# Patient Record
Sex: Male | Born: 1945 | Race: White | Hispanic: No | Smoking: Former smoker
Health system: Southern US, Community
[De-identification: ages and names within clinical notes are randomized; demographics above are authoritative.]

## PROBLEM LIST (undated history)

## (undated) DIAGNOSIS — Z87442 Personal history of urinary calculi: Secondary | ICD-10-CM

## (undated) DIAGNOSIS — I10 Essential (primary) hypertension: Secondary | ICD-10-CM

## (undated) DIAGNOSIS — S065XAA Traumatic subdural hemorrhage with loss of consciousness status unknown, initial encounter: Secondary | ICD-10-CM

## (undated) DIAGNOSIS — S065X9A Traumatic subdural hemorrhage with loss of consciousness of unspecified duration, initial encounter: Secondary | ICD-10-CM

## (undated) DIAGNOSIS — C61 Malignant neoplasm of prostate: Secondary | ICD-10-CM

## (undated) DIAGNOSIS — K219 Gastro-esophageal reflux disease without esophagitis: Secondary | ICD-10-CM

## (undated) DIAGNOSIS — R569 Unspecified convulsions: Secondary | ICD-10-CM

## (undated) DIAGNOSIS — C44310 Basal cell carcinoma of skin of unspecified parts of face: Secondary | ICD-10-CM

## (undated) DIAGNOSIS — K565 Intestinal adhesions [bands], unspecified as to partial versus complete obstruction: Secondary | ICD-10-CM

## (undated) DIAGNOSIS — H57819 Brow ptosis, unspecified: Secondary | ICD-10-CM

## (undated) DIAGNOSIS — E785 Hyperlipidemia, unspecified: Secondary | ICD-10-CM

## (undated) HISTORY — DX: Essential (primary) hypertension: I10

## (undated) HISTORY — DX: Hyperlipidemia, unspecified: E78.5

## (undated) HISTORY — PX: BRAIN SURGERY: SHX531

## (undated) HISTORY — PX: BASAL CELL CARCINOMA EXCISION: SHX1214

---

## 1997-06-14 DIAGNOSIS — C61 Malignant neoplasm of prostate: Secondary | ICD-10-CM

## 1997-06-14 HISTORY — DX: Malignant neoplasm of prostate: C61

## 1998-03-27 ENCOUNTER — Other Ambulatory Visit: Admission: RE | Admit: 1998-03-27 | Discharge: 1998-03-27 | Payer: Self-pay | Admitting: Urology

## 1998-05-11 ENCOUNTER — Emergency Department (HOSPITAL_COMMUNITY): Admission: EM | Admit: 1998-05-11 | Discharge: 1998-05-11 | Payer: Self-pay | Admitting: Dentistry

## 1998-05-20 ENCOUNTER — Inpatient Hospital Stay (HOSPITAL_COMMUNITY): Admission: RE | Admit: 1998-05-20 | Discharge: 1998-05-27 | Payer: Self-pay | Admitting: Urology

## 1998-05-20 HISTORY — PX: PROSTATECTOMY: SHX69

## 1998-05-23 ENCOUNTER — Encounter: Payer: Self-pay | Admitting: Urology

## 1998-05-23 ENCOUNTER — Encounter: Payer: Self-pay | Admitting: *Deleted

## 1998-05-24 ENCOUNTER — Encounter: Payer: Self-pay | Admitting: Urology

## 2003-07-03 ENCOUNTER — Ambulatory Visit (HOSPITAL_COMMUNITY): Admission: RE | Admit: 2003-07-03 | Discharge: 2003-07-03 | Payer: Self-pay | Admitting: *Deleted

## 2003-07-03 ENCOUNTER — Encounter (INDEPENDENT_AMBULATORY_CARE_PROVIDER_SITE_OTHER): Payer: Self-pay | Admitting: Specialist

## 2009-07-14 ENCOUNTER — Ambulatory Visit: Payer: Self-pay | Admitting: Surgery

## 2009-07-25 ENCOUNTER — Encounter: Admission: RE | Admit: 2009-07-25 | Discharge: 2009-07-25 | Payer: Self-pay | Admitting: Cardiology

## 2010-10-13 HISTORY — PX: COLECTOMY: SHX59

## 2010-10-20 ENCOUNTER — Ambulatory Visit (HOSPITAL_COMMUNITY)
Admission: RE | Admit: 2010-10-20 | Discharge: 2010-10-20 | Disposition: A | Discharge status: Home / Self Care | Payer: Medicare Other | Source: Ambulatory Visit | Attending: General Surgery | Admitting: General Surgery

## 2010-10-20 ENCOUNTER — Other Ambulatory Visit (HOSPITAL_COMMUNITY): Payer: Self-pay | Admitting: General Surgery

## 2010-10-20 ENCOUNTER — Encounter (HOSPITAL_COMMUNITY)
Admission: RE | Admit: 2010-10-20 | Discharge: 2010-10-20 | Disposition: A | Discharge status: Home / Self Care | Payer: Medicare Other | Source: Ambulatory Visit | Attending: General Surgery | Admitting: General Surgery

## 2010-10-20 DIAGNOSIS — Z01812 Encounter for preprocedural laboratory examination: Secondary | ICD-10-CM | POA: Insufficient documentation

## 2010-10-20 DIAGNOSIS — I10 Essential (primary) hypertension: Secondary | ICD-10-CM | POA: Insufficient documentation

## 2010-10-20 DIAGNOSIS — D126 Benign neoplasm of colon, unspecified: Secondary | ICD-10-CM | POA: Insufficient documentation

## 2010-10-20 DIAGNOSIS — Z01818 Encounter for other preprocedural examination: Secondary | ICD-10-CM | POA: Insufficient documentation

## 2010-10-20 DIAGNOSIS — Z01810 Encounter for preprocedural cardiovascular examination: Secondary | ICD-10-CM | POA: Insufficient documentation

## 2010-10-20 LAB — COMPREHENSIVE METABOLIC PANEL
CO2: 30 mEq/L (ref 19–32)
Calcium: 10 mg/dL (ref 8.4–10.5)
Chloride: 101 mEq/L (ref 96–112)
Creatinine, Ser: 1.01 mg/dL (ref 0.4–1.5)
GFR calc non Af Amer: >60 mL/min (ref >60)
Glucose, Bld: 102 mg/dL — ABNORMAL HIGH (ref 70–99)
Total Bilirubin: 0.5 mg/dL (ref 0.3–1.2)

## 2010-10-20 LAB — DIFFERENTIAL
Basophils Absolute: 0 10*3/uL (ref 0.0–0.1)
Basophils Relative: 1 % (ref 0–1)
Eosinophils Absolute: 0 10*3/uL (ref 0.0–0.7)
Eosinophils Relative: 1 % (ref 0–5)
Lymphocytes Relative: 28 % (ref 12–46)
Lymphs Abs: 1.6 10*3/uL (ref 0.7–4.0)
Monocytes Absolute: 0.7 10*3/uL (ref 0.1–1.0)
Monocytes Relative: 13 % — ABNORMAL HIGH (ref 3–12)
Neutro Abs: 3.3 10*3/uL (ref 1.7–7.7)
Neutrophils Relative %: 58 % (ref 43–77)

## 2010-10-20 LAB — SURGICAL PCR SCREEN: Staphylococcus aureus: NEGATIVE

## 2010-10-20 LAB — CBC
HCT: 41.7 % (ref 39.0–52.0)
Hemoglobin: 14.3 g/dL (ref 13.0–17.0)
MCH: 30.8 pg (ref 26.0–34.0)
MCHC: 34.3 g/dL (ref 30.0–36.0)
MCV: 89.7 fL (ref 78.0–100.0)

## 2010-10-26 ENCOUNTER — Inpatient Hospital Stay (HOSPITAL_COMMUNITY)
Admission: RE | Admit: 2010-10-26 | Discharge: 2010-10-30 | DRG: 331 | Disposition: A | Discharge status: Home / Self Care | Payer: Medicare Other | Source: Ambulatory Visit | Attending: General Surgery | Admitting: General Surgery

## 2010-10-26 ENCOUNTER — Other Ambulatory Visit: Payer: Self-pay | Admitting: General Surgery

## 2010-10-26 DIAGNOSIS — D371 Neoplasm of uncertain behavior of stomach: Secondary | ICD-10-CM | POA: Diagnosis present

## 2010-10-26 DIAGNOSIS — D1330 Benign neoplasm of unspecified part of small intestine: Principal | ICD-10-CM | POA: Diagnosis present

## 2010-10-26 DIAGNOSIS — I10 Essential (primary) hypertension: Secondary | ICD-10-CM | POA: Diagnosis not present

## 2010-10-26 LAB — TYPE AND SCREEN
ABO/RH(D): O POS
Antibody Screen: NEGATIVE

## 2010-10-27 LAB — CBC
Hemoglobin: 14.6 g/dL (ref 13.0–17.0)
Hemoglobin: 15.1 g/dL (ref 13.0–17.0)
MCH: 30.9 pg (ref 26.0–34.0)
MCH: 31.6 pg (ref 26.0–34.0)
MCHC: 34.8 g/dL (ref 30.0–36.0)
MCHC: 35.5 g/dL (ref 30.0–36.0)
MCV: 88.8 fL (ref 78.0–100.0)
Platelets: 250 10*3/uL (ref 150–400)
RDW: 12.7 % (ref 11.5–15.5)

## 2010-10-27 LAB — DIFFERENTIAL
Basophils Absolute: 0 10*3/uL (ref 0.0–0.1)
Basophils Relative: 0 % (ref 0–1)
Basophils Relative: 0 % (ref 0–1)
Eosinophils Absolute: 0 10*3/uL (ref 0.0–0.7)
Eosinophils Absolute: 0 10*3/uL (ref 0.0–0.7)
Eosinophils Relative: 0 % (ref 0–5)
Lymphs Abs: 1 10*3/uL (ref 0.7–4.0)
Monocytes Absolute: 1.9 10*3/uL — ABNORMAL HIGH (ref 0.1–1.0)
Monocytes Absolute: 1.7 10*3/uL — ABNORMAL HIGH (ref 0.1–1.0)
Monocytes Relative: 11 % (ref 3–12)
Monocytes Relative: 10 % (ref 3–12)
Neutro Abs: 13.9 10*3/uL — ABNORMAL HIGH (ref 1.7–7.7)
Neutro Abs: 13.6 10*3/uL — ABNORMAL HIGH (ref 1.7–7.7)

## 2010-10-27 LAB — BASIC METABOLIC PANEL
BUN: 6 mg/dL (ref 6–23)
CO2: 24 mEq/L (ref 19–32)
Calcium: 9.7 mg/dL (ref 8.4–10.5)
Chloride: 92 mEq/L — ABNORMAL LOW (ref 96–112)
Creatinine, Ser: 0.81 mg/dL (ref 0.4–1.5)
GFR calc Af Amer: >60 mL/min (ref >60)

## 2010-10-27 NOTE — Procedures (Signed)
CAROTID DUPLEX EXAM   INDICATION:  Right-sided bruit.   HISTORY:  Diabetes:  No  Cardiac:  No  Hypertension:  Yes  Smoking:  No  Previous Surgery:  No  CV History:  Asymptomatic                                       RIGHT             LEFT  Brachial systolic pressure:         161               135  Brachial Doppler waveforms:         triphasic         triphasic  Vertebral direction of flow:        antegrade         antegrade  DUPLEX VELOCITIES (cm/sec)  CCA peak systolic                   140               160  ECA peak systolic                   211               152  ICA peak systolic                   182               118  ICA end diastolic                   53                51  PLAQUE MORPHOLOGY:                  mixed             mixed  PLAQUE AMOUNT:                      moderate          moderate  PLAQUE LOCATION:                    ICA, ECA, bifurcation               CCA, bifurcation, ICA   IMPRESSION:  1. A 40% to 59% stenosis noted in bilateral internal carotid arteries.  2. Bilateral external carotid artery stenosis.  3. Bilateral common carotid artery stenosis.  4. Internal carotid arteries are tortuous bilaterally.   ___________________________________________  V. Charlena Cross, MD   CJ/MEDQ  D:  07/14/2009  T:  07/14/2009  Job:  [id number]

## 2010-10-28 LAB — DIFFERENTIAL
Basophils Absolute: 0 10*3/uL (ref 0.0–0.1)
Basophils Relative: 0 % (ref 0–1)
Eosinophils Absolute: 0 10*3/uL (ref 0.0–0.7)
Monocytes Relative: 13 % — ABNORMAL HIGH (ref 3–12)
Neutro Abs: 5.5 10*3/uL (ref 1.7–7.7)
Neutrophils Relative %: 67 % (ref 43–77)

## 2010-10-28 LAB — BASIC METABOLIC PANEL
CO2: 27 mEq/L (ref 19–32)
Calcium: 9.2 mg/dL (ref 8.4–10.5)
Chloride: 100 mEq/L (ref 96–112)
Creatinine, Ser: 0.92 mg/dL (ref 0.4–1.5)
GFR calc Af Amer: >60 mL/min (ref >60)
Sodium: 136 mEq/L (ref 135–145)

## 2010-10-28 LAB — CBC
Hemoglobin: 14 g/dL (ref 13.0–17.0)
MCH: 30.9 pg (ref 26.0–34.0)
Platelets: 188 10*3/uL (ref 150–400)
RBC: 4.53 MIL/uL (ref 4.22–5.81)
WBC: 8.2 10*3/uL (ref 4.0–10.5)

## 2010-10-29 ENCOUNTER — Encounter (INDEPENDENT_AMBULATORY_CARE_PROVIDER_SITE_OTHER): Payer: Self-pay | Admitting: General Surgery

## 2010-11-11 NOTE — Discharge Summary (Signed)
  Martin Edwards, Martin Edwards NO.:  [id number]  MEDICAL RECORD NO.:  [id number]           PATIENT TYPE:  LOCATION:                                 FACILITY:  PHYSICIAN:  Cherylynn Ridges, M.D.    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  POSTDISCHARGE DIAGNOSIS:  Pending pathology.  PRINCIPAL PROCEDURE:  Partial right colectomy by Dr. Lindie Spruce.  SURGEON:  Cherylynn Ridges, MD  ASSISTANT:  Anselm Pancoast. Zachery Dakins, MD  DISCHARGE MEDICATIONS:  His home medications were Percocet to take for pain.  DIET ON DISCHARGE:  Regular.  CONDITION:  Stable.  He can shower and pat his wound dry.  He is to follow up to see me in a week from Tuesday which will be Nov 10, 2010.  BRIEF SUMMARY OF HOSPITAL COURSE:  The patient was admitted on the day of surgery for a right colectomy for a tubular wide base polyp of the ileocecal valve.  Postoperatively, he had what appeared to be an allergic reaction to some of his medication that was treated with Benadryl and although the patient had minimal itching.  He was bloated for his postop day, but that subsided.  He started having bowel movements and passing gas on postop day #3.  He has done well.  His diet has been advanced to a soft diet.  He complained today of just some lateral flank tenderness and maybe some gas pain, but he has had regular bowel movements and passing gas.  His wound was great with no infection.  He has got good bowel sounds.  He does not look very sick.  He is afebrile and vital signs stable.  His last white count was 8.6 and his hemoglobin was 14.  He is being discharged home in care of his family, will follow up to see me in a week from Tuesday, Nov 10, 2010.  He should call if he should have any problems before that.     Cherylynn Ridges,  M.D.     JOW/MEDQ  D:  10/30/2010  T:  10/30/2010  Job:  [id number]  Electronically Signed by Jimmye Norman M.D. on 11/11/2010 05:11:03 PM

## 2010-11-11 NOTE — Op Note (Signed)
NAMEEMERY, DUPUY NO.:  [id number]  MEDICAL RECORD NO.:  [id number]           PATIENT TYPE:  I  LOCATION:  5122                         FACILITY:  [facility]  PHYSICIAN:  Cherylynn Ridges, M.D.    DATE OF BIRTH:  14-Jul-1945  DATE OF PROCEDURE:  10/26/2010 DATE OF DISCHARGE:                              OPERATIVE REPORT   PREOPERATIVE DIAGNOSIS:  Sessile polyp of the terminal ileum and right colon proximal ascending colon.  POSTOPERATIVE DIAGNOSIS:  Sessile polyp of the terminal ileum and right colon proximal ascending colon.  PROCEDURE:  Partial right colectomy.  SURGEON:  Cherylynn Ridges, MD  ASSISTANT:  Dr. Zachery Dakins.  ANESTHESIA:  General endotracheal.  ESTIMATED BLOOD LOSS:  Less than 50 mL.  COMPLICATIONS:  None.  CONDITION:  Stable.  FINDINGS:  Sessile polyp of the terminal ileum and proximal ascending colon.  INDICATIONS FOR OPERATION:  The patient is a 65 year old with a recent colonoscopy demonstrating a sort of shag-rug tubulovillous adenoma of the terminal ileum where the proximal right colon, comes in now for right colectomy.  OPERATION:  After proper time-out was performed, identifying the patient, the procedure will be performed.  He was prepped and draped in usual sterile manner exposing the entire abdomen.  A right transverse incision which measured approximately 12-cm long was made initially with #10 blade at the level of the umbilicus.  We took it down to and through the subcutaneous tissue through and to the anterior rectus sheath, the rectus muscle itself and then the posterior rectus sheath into the peritoneal cavity.  We primarily reached and retractor in place, we were able to mobilize the right colon which was freely mobile initially on its own.  We transected the terminal ileum just proximal to the terminal ileum using a GIA 75 stapler.  We mobilized right colon just proximal to the hepatic flexure and then transected the  right colon at that point with the GIA 75 stapler.  We took the intervening mesentery using an EnSeal super jaw device up until we got to the ileocecal blood vessels which was ligated with 2-0 silk ties.  Once we had the mesentery taken, we removed the specimen, opened and off the field to confirm the presence of the colonoscopically identified the polyp.  Once this was done, we went ahead and did a primary anastomosis using a GIA 75 stapler was through the small bowel and the distal ascending colon.  The resulting enterotomy was closed using a TX-60 stapler.  We closed the mesentery using interrupted 2-0 silk sutures.  We oversewed the anastomotic line using 3-0 silk sutures.  Gloves were changed after the primary section and also after the anastomosis completed.  We then irrigated with saline solution about a half a liter of saline was used and then we closed.  The posterior and anterior rectus sheath were closed using running looped 0 PDS suture, then the skin was closed using running subcuticular stitch of 5-0 Monocryl.  A 0.5% Marcaine with epi was injected into the subcu prior to closure.  Dermabond, Steri-Strips and Tegaderm were used to complete the dressing.  All needle, sponge counts and instrument counts were correct at the conclusion of the case.     Cherylynn Ridges, M.D.     JOW/MEDQ  D:  10/26/2010  T:  10/26/2010  Job:  [id number]  cc:   Shirley Friar, MD  Electronically Signed by Jimmye Norman M.D. on 11/11/2010 05:10:58 PM

## 2010-12-29 ENCOUNTER — Ambulatory Visit (INDEPENDENT_AMBULATORY_CARE_PROVIDER_SITE_OTHER): Payer: Medicare Other | Admitting: General Surgery

## 2010-12-29 ENCOUNTER — Encounter (INDEPENDENT_AMBULATORY_CARE_PROVIDER_SITE_OTHER): Payer: Self-pay | Admitting: General Surgery

## 2010-12-29 VITALS — BP 162/82 | HR 72 | Temp 97.4°F

## 2010-12-29 DIAGNOSIS — Z09 Encounter for follow-up examination after completed treatment for conditions other than malignant neoplasm: Secondary | ICD-10-CM | POA: Insufficient documentation

## 2010-12-29 NOTE — Progress Notes (Signed)
HPI The patient is status post right colectomy and doing well. He played tennis last night his wound is healing well with no infection.  PE His wound is healed well no infection she's got good bowel sounds.  Studiy review There are no studies reviewed today.  Assessment Status post right colectomy doing well.  Plan Discharge patient from clinic. I advised him to get a colonoscopy within a year.

## 2011-09-07 ENCOUNTER — Other Ambulatory Visit: Payer: Self-pay | Admitting: Emergency Medicine

## 2011-10-21 ENCOUNTER — Ambulatory Visit (INDEPENDENT_AMBULATORY_CARE_PROVIDER_SITE_OTHER): Payer: Medicare Other | Admitting: Emergency Medicine

## 2011-10-21 VITALS — BP 158/81 | HR 88 | Temp 98.1°F | Resp 18 | Ht 70.5 in | Wt 171.2 lb

## 2011-10-21 DIAGNOSIS — D126 Benign neoplasm of colon, unspecified: Secondary | ICD-10-CM | POA: Insufficient documentation

## 2011-10-21 DIAGNOSIS — E785 Hyperlipidemia, unspecified: Secondary | ICD-10-CM | POA: Insufficient documentation

## 2011-10-21 DIAGNOSIS — Q759 Congenital malformation of skull and face bones, unspecified: Secondary | ICD-10-CM

## 2011-10-21 DIAGNOSIS — R7309 Other abnormal glucose: Secondary | ICD-10-CM

## 2011-10-21 DIAGNOSIS — R739 Hyperglycemia, unspecified: Secondary | ICD-10-CM

## 2011-10-21 DIAGNOSIS — Z8546 Personal history of malignant neoplasm of prostate: Secondary | ICD-10-CM

## 2011-10-21 DIAGNOSIS — Q752 Hypertelorism: Secondary | ICD-10-CM

## 2011-10-21 DIAGNOSIS — I10 Essential (primary) hypertension: Secondary | ICD-10-CM | POA: Insufficient documentation

## 2011-10-21 LAB — COMPREHENSIVE METABOLIC PANEL
ALT: 17 U/L (ref 0–53)
AST: 20 U/L (ref 0–37)
Albumin: 4.7 g/dL (ref 3.5–5.2)
Alkaline Phosphatase: 68 U/L (ref 39–117)
Calcium: 10.2 mg/dL (ref 8.4–10.5)
Chloride: 102 mEq/L (ref 96–112)
Potassium: 5.2 mEq/L (ref 3.5–5.3)
Sodium: 140 mEq/L (ref 135–145)

## 2011-10-21 LAB — GLUCOSE, POCT (MANUAL RESULT ENTRY): POC Glucose: 114

## 2011-10-21 LAB — PSA, MEDICARE: PSA: 0.16 ng/mL (ref <=4.00)

## 2011-10-21 LAB — LIPID PANEL: LDL Cholesterol: 94 mg/dL (ref 0–99)

## 2011-10-21 LAB — POCT CBC
Granulocyte percent: 71.7 %G (ref 37–80)
HCT, POC: 43.7 % (ref 43.5–53.7)
Hemoglobin: 14.3 g/dL (ref 14.1–18.1)
Lymph, poc: 1.6 (ref 0.6–3.4)
MPV: 11.5 fL (ref 0–99.8)
POC Granulocyte: 5.1 (ref 2–6.9)
RBC: 4.72 M/uL (ref 4.69–6.13)

## 2011-10-21 LAB — POCT URINALYSIS DIPSTICK
Glucose, UA: NEGATIVE
Ketones, UA: NEGATIVE
Nitrite, UA: NEGATIVE

## 2011-10-21 LAB — POCT GLYCOSYLATED HEMOGLOBIN (HGB A1C): Hemoglobin A1C: 5.8

## 2011-10-21 LAB — POCT UA - MICROSCOPIC ONLY
Bacteria, U Microscopic: NEGATIVE
Casts, Ur, LPF, POC: NEGATIVE
Crystals, Ur, HPF, POC: NEGATIVE
Mucus, UA: NEGATIVE

## 2011-10-21 NOTE — Progress Notes (Signed)
  Subjective:    Patient ID: Martin Edwards, male    DOB: 1946/02/01, 66 y.o.   MRN: [id number]  HPI patient enters for followup of blood pressure high cholesterol. Since his last visit here he has undergone colonoscopy with abnormal findings. He subsequently had a segment of colon removed that was suspicious for early precancerous changes. Surgery was performed by Dr. Lindie Spruce. He overall has been doing well no complaints at the present time. He feels well he is very active.    Review of Systems is no complaints at the present time     Objective:   Physical Exam  Constitutional: He appears well-developed and well-nourished.  HENT:  Right Ear: External ear normal.  Left Ear: External ear normal.  Eyes: Pupils are equal, round, and reactive to light.  Neck: No thyromegaly present.  Cardiovascular: Normal rate, regular rhythm and normal heart sounds.   Pulmonary/Chest: Effort normal and breath sounds normal. He has no wheezes. He has no rales.  Abdominal: He exhibits no distension and no mass. There is no tenderness. There is no rebound and no guarding.    EKG normal sinus      Assessment & Plan:    Patient undergo treatment for his blood pressure and cholesterol. EKG is within normal limits. Have advised that he see his dermatologist. I have advised he call Dr. Bosie Clos and see if they want to proceed with a colonoscopy to be done one year post procedure.

## 2011-10-25 ENCOUNTER — Other Ambulatory Visit: Payer: Self-pay | Admitting: Physician Assistant

## 2011-12-23 ENCOUNTER — Other Ambulatory Visit: Payer: Self-pay | Admitting: *Deleted

## 2011-12-23 MED ORDER — LISINOPRIL 40 MG PO TABS: 40.0000 mg | ORAL_TABLET | Freq: Every day | ORAL | Status: DC

## 2011-12-23 MED ORDER — SIMVASTATIN 40 MG PO TABS: 40.0000 mg | ORAL_TABLET | Freq: Every day | ORAL | Status: DC

## 2011-12-23 MED ORDER — AMLODIPINE BESYLATE 10 MG PO TABS: 10.0000 mg | ORAL_TABLET | Freq: Every day | ORAL | Status: DC

## 2012-02-21 ENCOUNTER — Encounter: Payer: Self-pay | Admitting: Cardiology

## 2012-04-15 ENCOUNTER — Other Ambulatory Visit: Payer: Self-pay | Admitting: Physician Assistant

## 2012-08-10 ENCOUNTER — Other Ambulatory Visit: Payer: Self-pay | Admitting: Physician Assistant

## 2012-08-11 ENCOUNTER — Other Ambulatory Visit: Payer: Self-pay | Admitting: Physician Assistant

## 2012-08-11 ENCOUNTER — Ambulatory Visit (INDEPENDENT_AMBULATORY_CARE_PROVIDER_SITE_OTHER): Payer: Medicare Other | Admitting: Emergency Medicine

## 2012-08-11 VITALS — BP 158/68 | HR 97 | Temp 98.4°F | Resp 18 | Ht 69.0 in | Wt 171.0 lb

## 2012-08-11 DIAGNOSIS — E785 Hyperlipidemia, unspecified: Secondary | ICD-10-CM

## 2012-08-11 DIAGNOSIS — D369 Benign neoplasm, unspecified site: Secondary | ICD-10-CM

## 2012-08-11 LAB — POCT CBC
Granulocyte percent: 45.3 %G (ref 37–80)
HCT, POC: 46.6 % (ref 43.5–53.7)
Lymph, poc: 2.7 (ref 0.6–3.4)
MCV: 94.7 fL (ref 80–97)
POC LYMPH PERCENT: 46.1 %L (ref 10–50)
RDW, POC: 14 %

## 2012-08-11 LAB — COMPREHENSIVE METABOLIC PANEL
Albumin: 4.5 g/dL (ref 3.5–5.2)
Alkaline Phosphatase: 60 U/L (ref 39–117)
BUN: 9 mg/dL (ref 6–23)
CO2: 28 mEq/L (ref 19–32)
Calcium: 9.7 mg/dL (ref 8.4–10.5)
Glucose, Bld: 105 mg/dL — ABNORMAL HIGH (ref 70–99)
Potassium: 4.8 mEq/L (ref 3.5–5.3)

## 2012-08-11 LAB — LIPID PANEL
Cholesterol: 180 mg/dL (ref 0–200)
HDL: 44 mg/dL (ref >39)
LDL Cholesterol: 114 mg/dL — ABNORMAL HIGH (ref 0–99)
Triglycerides: 111 mg/dL (ref <150)

## 2012-08-11 MED ORDER — LISINOPRIL 40 MG PO TABS: 40.0000 mg | ORAL_TABLET | Freq: Every day | ORAL | Status: DC

## 2012-08-11 MED ORDER — PRAVASTATIN SODIUM 40 MG PO TABS: 40.0000 mg | ORAL_TABLET | Freq: Every day | ORAL | Status: DC

## 2012-08-11 MED ORDER — AMLODIPINE BESYLATE 10 MG PO TABS: 10.0000 mg | ORAL_TABLET | Freq: Every day | ORAL | Status: DC

## 2012-08-11 NOTE — Progress Notes (Signed)
Subjective:    Patient ID: Martin Edwards, male    DOB: Feb 23, 1946, 67 y.o.   MRN: [id number]  HPI patient enters for recheck of his blood pressure and high cholesterol. He's been taking all his medications he had surgery last year for an adenomatous polyp in the right side of his colon biopsy report showed no malignancy no severe dysplasia . Margins were clear at the surgical site    Review of Systems     Objective:   Physical Exam HEENT exam is unremarkable. Neck is supple. Chest is clear to both auscultation and percussion. Heart regular rate no murmurs rubs or gallops appreciated extremities are without edema. Repeat blood pressure was taken and was 122/78  Results for orders placed in visit on 10/21/11  COMPREHENSIVE METABOLIC PANEL      Result Value Range   Sodium 140  135 - 145 mEq/L   Potassium 5.2  3.5 - 5.3 mEq/L   Chloride 102  96 - 112 mEq/L   CO2 27  19 - 32 mEq/L   Glucose, Bld 115 (*) 70 - 99 mg/dL   BUN 11  6 - 23 mg/dL   Creat 4.09  8.11 - 9.14 mg/dL   Total Bilirubin 0.9  0.3 - 1.2 mg/dL   Alkaline Phosphatase 68  39 - 117 U/L   AST 20  0 - 37 U/L   ALT 17  0 - 53 U/L   Total Protein 7.8  6.0 - 8.3 g/dL   Albumin 4.7  3.5 - 5.2 g/dL   Calcium 78.2  8.4 - 95.6 mg/dL  LIPID PANEL      Result Value Range   Cholesterol 166  0 - 200 mg/dL   Triglycerides 213  <086 mg/dL   HDL 46  >57 mg/dL   Total CHOL/HDL Ratio 3.6     VLDL 26  0 - 40 mg/dL   LDL Cholesterol 94  0 - 99 mg/dL  PSA, MEDICARE      Result Value Range   PSA 0.16  <=4.00 ng/mL  POCT URINALYSIS DIPSTICK      Result Value Range   Color, UA yellow     Clarity, UA clear     Glucose, UA neg     Bilirubin, UA neg     Ketones, UA neg     Spec Grav, UA 1.010     Blood, UA neg     pH, UA 6.5     Protein, UA neg     Urobilinogen, UA 0.2     Nitrite, UA neg     Leukocytes, UA Negative    POCT UA - MICROSCOPIC ONLY      Result Value Range   WBC, Ur, HPF, POC rare     RBC, urine, microscopic  rare     Bacteria, U Microscopic neg     Mucus, UA neg     Epithelial cells, urine per micros 0-1     Crystals, Ur, HPF, POC neg     Casts, Ur, LPF, POC neg     Yeast, UA neg    POCT CBC      Result Value Range   WBC 7.1  4.6 - 10.2 K/uL   Lymph, poc 1.6  0.6 - 3.4   POC LYMPH PERCENT 22.4  10 - 50 %L   MID (cbc) 0.4  0 - 0.9   POC MID % 5.9  0 - 12 %M   POC Granulocyte 5.1  2 -  6.9   Granulocyte percent 71.7  37 - 80 %G   RBC 4.72  4.69 - 6.13 M/uL   Hemoglobin 14.3  14.1 - 18.1 g/dL   HCT, POC 78.2  95.6 - 53.7 %   MCV 92.5  80 - 97 fL   MCH, POC 30.3  27 - 31.2 pg   MCHC 32.7  31.8 - 35.4 g/dL   RDW, POC 21.3     Platelet Count, POC 216  142 - 424 K/uL   MPV 11.5  0 - 99.8 fL  GLUCOSE, POCT (MANUAL RESULT ENTRY)      Result Value Range   POC Glucose 114    POCT GLYCOSYLATED HEMOGLOBIN (HGB A1C)      Result Value Range   Hemoglobin A1C 5.8     Results for orders placed in visit on 08/11/12  POCT CBC      Result Value Range   WBC 5.9  4.6 - 10.2 K/uL   Lymph, poc 2.7  0.6 - 3.4   POC LYMPH PERCENT 46.1  10 - 50 %L   MID (cbc) 0.5  0 - 0.9   POC MID % 8.6  0 - 12 %M   POC Granulocyte 2.7  2 - 6.9   Granulocyte percent 45.3  37 - 80 %G   RBC 4.92  4.69 - 6.13 M/uL   Hemoglobin 15.0  14.1 - 18.1 g/dL   HCT, POC 08.6  57.8 - 53.7 %   MCV 94.7  80 - 97 fL   MCH, POC 30.5  27 - 31.2 pg   MCHC 32.2  31.8 - 35.4 g/dL   RDW, POC 46.9     Platelet Count, POC 173  142 - 424 K/uL   MPV 13.2  0 - 99.8 fL       Assessment & Plan:  Patient stable on current regimen. He refused a flu injection. Labs were done and all medicines refilled

## 2012-08-12 ENCOUNTER — Other Ambulatory Visit: Payer: Self-pay | Admitting: Physician Assistant

## 2012-12-12 ENCOUNTER — Encounter: Payer: Self-pay | Admitting: Emergency Medicine

## 2012-12-12 ENCOUNTER — Ambulatory Visit (INDEPENDENT_AMBULATORY_CARE_PROVIDER_SITE_OTHER): Payer: Medicare Other | Admitting: Emergency Medicine

## 2012-12-12 VITALS — BP 158/68 | HR 76 | Temp 98.2°F | Resp 16 | Ht 68.5 in | Wt 168.0 lb

## 2012-12-12 DIAGNOSIS — E785 Hyperlipidemia, unspecified: Secondary | ICD-10-CM

## 2012-12-12 DIAGNOSIS — Z139 Encounter for screening, unspecified: Secondary | ICD-10-CM

## 2012-12-12 DIAGNOSIS — I6523 Occlusion and stenosis of bilateral carotid arteries: Secondary | ICD-10-CM

## 2012-12-12 DIAGNOSIS — C61 Malignant neoplasm of prostate: Secondary | ICD-10-CM | POA: Insufficient documentation

## 2012-12-12 DIAGNOSIS — Z0000 Encounter for general adult medical examination without abnormal findings: Secondary | ICD-10-CM

## 2012-12-12 DIAGNOSIS — I10 Essential (primary) hypertension: Secondary | ICD-10-CM

## 2012-12-12 DIAGNOSIS — Z125 Encounter for screening for malignant neoplasm of prostate: Secondary | ICD-10-CM

## 2012-12-12 LAB — CBC WITH DIFFERENTIAL/PLATELET
Basophils Absolute: 0 10*3/uL (ref 0.0–0.1)
HCT: 42.4 % (ref 39.0–52.0)
Lymphocytes Relative: 23 % (ref 12–46)
Neutro Abs: 3.2 10*3/uL (ref 1.7–7.7)
Platelets: 213 10*3/uL (ref 150–400)
RBC: 4.86 MIL/uL (ref 4.22–5.81)
RDW: 14.1 % (ref 11.5–15.5)
WBC: 4.9 10*3/uL (ref 4.0–10.5)

## 2012-12-12 LAB — COMPREHENSIVE METABOLIC PANEL
ALT: 14 U/L (ref 0–53)
AST: 17 U/L (ref 0–37)
Albumin: 4.9 g/dL (ref 3.5–5.2)
Calcium: 9.7 mg/dL (ref 8.4–10.5)
Chloride: 102 mEq/L (ref 96–112)
Potassium: 4.3 mEq/L (ref 3.5–5.3)
Total Protein: 7.7 g/dL (ref 6.0–8.3)

## 2012-12-12 LAB — POCT GLYCOSYLATED HEMOGLOBIN (HGB A1C): Hemoglobin A1C: 5.5

## 2012-12-12 LAB — POCT URINALYSIS DIPSTICK
Bilirubin, UA: NEGATIVE
Blood, UA: NEGATIVE
Glucose, UA: NEGATIVE
Ketones, UA: NEGATIVE
Spec Grav, UA: <=1.005

## 2012-12-12 LAB — PSA, MEDICARE: PSA: 0.23 ng/mL (ref <=4.00)

## 2012-12-12 NOTE — Progress Notes (Signed)
@[email or web address] @  Patient ID: Martin Edwards MRN: [id number], DOB: 1946-01-29 67 y.o. Date of Encounter: 12/12/2012, 10:00 AM  Primary Physician: Lucilla Edin, MD  Chief Complaint: Physical (CPE)  HPI: 67 y.o. y/o male with history noted below here for CPE.  Doing well. No issues/complaints.  Review of Systems:  Consitutional: No fever, chills, fatigue, night sweats, lymphadenopathy, or weight changes. Eyes: No visual changes, eye redness, or discharge. ENT/Mouth: Ears: No otalgia, tinnitus, hearing loss, discharge. Nose: No congestion, rhinorrhea, sinus pain, or epistaxis. Throat: No sore throat, post nasal drip, or teeth pain. Cardiovascular: No CP, palpitations, diaphoresis, DOE, edema, orthopnea, PND. Respiratory: No cough, hemoptysis, SOB, or wheezing. Gastrointestinal: No anorexia, dysphagia, reflux, pain, nausea, vomiting, hematemesis, diarrhea, constipation, BRBPR, or melena he has a history of surgery on his colon for precancerous lesion the. Genitourinary: No dysuria, frequency, urgency, hematuria, incontinence, nocturia, decreased urinary stream, discharge, impotence, or testicular pain/masses he has a history of prostate cancer followed have been clear. Musculoskeletal: No decreased ROM, myalgias, stiffness, joint swelling, or weakness. Skin: No rash, erythema, lesion changes, pain, warmth, jaundice, or pruritis. Neurological: No headache, dizziness, syncope, seizures, tremors, memory loss, coordination problems, or paresthesias. Psychological: No anxiety, depression, hallucinations, SI/HI. Endocrine: No fatigue, polydipsia, polyphagia, polyuria, or known diabetes. All other systems were reviewed and are otherwise negative.  Past Medical History  Diagnosis Date  . Hypertension   . Hyperlipidemia   . Cancer      Past Surgical History  Procedure Laterality Date  . Prostate surgery  05/20/1998  . Colon surgery      Home Meds:  Prior to Admission medications   Medication  Sig Start Date End Date Taking? Authorizing Provider  amLODipine (NORVASC) 10 MG tablet Take 1 tablet (10 mg total) by mouth daily. Need office visit for additional refills. 08/11/12  Yes Collene Gobble, MD  lisinopril (PRINIVIL,ZESTRIL) 40 MG tablet Take 1 tablet (40 mg total) by mouth daily. Need office visit for additional refills. 08/11/12  Yes Collene Gobble, MD  pravastatin (PRAVACHOL) 40 MG tablet Take 1 tablet (40 mg total) by mouth daily. 08/11/12  Yes Collene Gobble, MD    Allergies: No Known Allergies  History   Social History  . Marital Status: Divorced    Spouse Name: N/A    Number of Children: N/A  . Years of Education: N/A   Occupational History  . Not on file.   Social History Main Topics  . Smoking status: Never Smoker   . Smokeless tobacco: Not on file  . Alcohol Use: No     Comment: very little  . Drug Use: No  . Sexually Active: Not on file   Other Topics Concern  . Not on file   Social History Narrative  . No narrative on file    Family History  Problem Relation Age of Onset  . Cancer Father     Lung    Physical Exam:  Blood pressure 158/68, pulse 76, temperature 98.2 F (36.8 C), temperature source Oral, resp. rate 16, height 5' 8.5" (1.74 m), weight 168 lb (76.204 kg), SpO2 100.00%.  General: Well developed, well nourished, in no acute distress. HEENT: Normocephalic, atraumatic. Conjunctiva pink, sclera non-icteric. Pupils 2 mm constricting to 1 mm, round, regular, and equally reactive to light and accomodation. EOMI. Internal auditory canal clear. TMs with good cone of light and without pathology. Nasal mucosa pink. Nares are without discharge. No sinus tenderness. Oral mucosa pink. Dentition . Pharynx without exudate.  Neck: Supple. Trachea midline. No thyromegaly. Full ROM. No lymphadenopathy. Lungs: Clear to auscultation bilaterally without wheezes, rales, or rhonchi. Breathing is of normal effort and unlabored. Cardiovascular: RRR with S1 S2. No  murmurs, rubs, or gallops appreciated. Distal pulses 2+ symmetrically. No carotid or abdominal bruits.  Abdomen: Soft, non-tender, non-distended with normoactive bowel sounds. No hepatosplenomegaly or masses. No rebound/guarding. No CVA tenderness. Without hernias.  Rectal: No external hemorrhoids or fissures. Rectal vault without masses. Genitourinary:  circumcised male. No penile lesions. Testes descended bilaterally, and smooth without tenderness or masses. There is no prostate tissue palpable.  Musculoskeletal: Full range of motion and 5/5 strength throughout. Without swelling, atrophy, tenderness, crepitus, or warmth. Extremities without clubbing, cyanosis, or edema. Calves supple. Skin: Warm and moist without erythema, ecchymosis, wounds, or rash. There is a 3 x 3 cm cystic area adjacent to the sacrum on his back. Neuro: A+Ox3. CN II-XII grossly intact. Moves all extremities spontaneously. Full sensation throughout. Normal gait. DTR 2+ throughout upper and lower extremities. Finger to nose intact. Psych:  Responds to questions appropriately with a normal affect.  EKG normal sinus rhythm mild J-point depression . Results for orders placed in visit on 12/12/12  IFOBT (OCCULT BLOOD)      Result Value Range   IFOBT Negative    POCT URINALYSIS DIPSTICK      Result Value Range   Color, UA yellow     Clarity, UA clear     Glucose, UA neg     Bilirubin, UA neg     Ketones, UA neg     Spec Grav, UA <=1.005     Blood, UA neg     pH, UA 6.0     Protein, UA neg     Urobilinogen, UA 0.2     Nitrite, UA neg     Leukocytes, UA Negative    POCT GLYCOSYLATED HEMOGLOBIN (HGB A1C)      Result Value Range   Hemoglobin A1C 5.5     Studies: CBC, CMET, Lipid, PSA, UA,Hemosure      Assessment/Plan:  67 y.o. y/o physically active male who is here for a physical exam. Routine labs were done. He has a history of premalignant colon pathology. Jos is a history of prostate cancer and his PSA was  slowly rising on last check needs to be repeated the  -  Signed, Earl Lites, MD 12/12/2012 10:00 AM

## 2013-06-19 ENCOUNTER — Ambulatory Visit (INDEPENDENT_AMBULATORY_CARE_PROVIDER_SITE_OTHER): Payer: Medicare Other | Admitting: Emergency Medicine

## 2013-06-19 ENCOUNTER — Encounter: Payer: Self-pay | Admitting: Emergency Medicine

## 2013-06-19 VITALS — BP 164/78 | HR 83 | Temp 98.3°F | Resp 16 | Ht 69.0 in | Wt 170.2 lb

## 2013-06-19 DIAGNOSIS — Z125 Encounter for screening for malignant neoplasm of prostate: Secondary | ICD-10-CM

## 2013-06-19 DIAGNOSIS — C61 Malignant neoplasm of prostate: Secondary | ICD-10-CM

## 2013-06-19 DIAGNOSIS — I10 Essential (primary) hypertension: Secondary | ICD-10-CM

## 2013-06-19 DIAGNOSIS — E785 Hyperlipidemia, unspecified: Secondary | ICD-10-CM

## 2013-06-19 LAB — CBC WITH DIFFERENTIAL/PLATELET
BASOS ABS: 0 10*3/uL (ref 0.0–0.1)
Basophils Relative: 0 % (ref 0–1)
EOS ABS: 0 10*3/uL (ref 0.0–0.7)
EOS PCT: 0 % (ref 0–5)
HEMATOCRIT: 44.3 % (ref 39.0–52.0)
Hemoglobin: 15.6 g/dL (ref 13.0–17.0)
LYMPHS ABS: 1.3 10*3/uL (ref 0.7–4.0)
LYMPHS PCT: 27 % (ref 12–46)
MCH: 30.4 pg (ref 26.0–34.0)
MCHC: 35.2 g/dL (ref 30.0–36.0)
MCV: 86.4 fL (ref 78.0–100.0)
MONO ABS: 0.4 10*3/uL (ref 0.1–1.0)
Monocytes Relative: 9 % (ref 3–12)
Neutro Abs: 3.1 10*3/uL (ref 1.7–7.7)
Neutrophils Relative %: 64 % (ref 43–77)
PLATELETS: 212 10*3/uL (ref 150–400)
RBC: 5.13 MIL/uL (ref 4.22–5.81)
RDW: 14.3 % (ref 11.5–15.5)
WBC: 4.8 10*3/uL (ref 4.0–10.5)

## 2013-06-19 LAB — COMPLETE METABOLIC PANEL WITH GFR
ALBUMIN: 4.4 g/dL (ref 3.5–5.2)
ALK PHOS: 60 U/L (ref 39–117)
ALT: 12 U/L (ref 0–53)
AST: 18 U/L (ref 0–37)
BILIRUBIN TOTAL: 1 mg/dL (ref 0.3–1.2)
BUN: 8 mg/dL (ref 6–23)
CO2: 25 mEq/L (ref 19–32)
Calcium: 9.6 mg/dL (ref 8.4–10.5)
Chloride: 102 mEq/L (ref 96–112)
Creat: 0.88 mg/dL (ref 0.50–1.35)
GFR, Est African American: >89 mL/min
GFR, Est Non African American: 88 mL/min
Glucose, Bld: 113 mg/dL — ABNORMAL HIGH (ref 70–99)
POTASSIUM: 4.3 meq/L (ref 3.5–5.3)
SODIUM: 138 meq/L (ref 135–145)
TOTAL PROTEIN: 7.6 g/dL (ref 6.0–8.3)

## 2013-06-19 LAB — LIPID PANEL
CHOL/HDL RATIO: 3.7 ratio
Cholesterol: 202 mg/dL — ABNORMAL HIGH (ref 0–200)
HDL: 54 mg/dL (ref >39)
LDL CALC: 119 mg/dL — AB (ref 0–99)
Triglycerides: 145 mg/dL (ref <150)
VLDL: 29 mg/dL (ref 0–40)

## 2013-06-19 LAB — PSA, MEDICARE: PSA: 0.19 ng/mL (ref <=4.00)

## 2013-06-19 MED ORDER — HYDROCHLOROTHIAZIDE 12.5 MG PO CAPS: 12.5000 mg | ORAL_CAPSULE | Freq: Every day | ORAL | Status: DC

## 2013-06-19 NOTE — Patient Instructions (Signed)
Recheck in 6 months. Have your blood pressure checked at either [facility] or [facility] at the machine and see if the addition of the HCTZ improved your blood pressure control.

## 2013-06-19 NOTE — Progress Notes (Signed)
   Subjective:    Patient ID: Martin Edwards, male    DOB: 1946/06/10, 68 y.o.   MRN: [id number]  HPI patient here in followup her blood pressure hyperlipidemia. He just has a history of prostate cancer. His PSA has been slowly creeping but still under 0.5 . He states he feels fine no complaints he has had no episodes of chest pain shortness of breath or swelling in his legs.    Review of Systems     Objective:   Physical Exam patient is alert and cooperative he is in no distress his neck is supple chest clear heart regular rate no murmurs rubs or gallops appreciated. Extremities are without edema.        Assessment & Plan:  Patient declined shingles vaccine. Patient declines flu vaccine. Blood pressure is not at goal. We'll add HCTZ to his current regimen. PSA was done because his level has been slowly increasing over the last year

## 2013-07-29 ENCOUNTER — Other Ambulatory Visit: Payer: Self-pay | Admitting: Emergency Medicine

## 2013-12-18 ENCOUNTER — Ambulatory Visit (INDEPENDENT_AMBULATORY_CARE_PROVIDER_SITE_OTHER): Payer: Medicare Other | Admitting: Emergency Medicine

## 2013-12-18 ENCOUNTER — Encounter: Payer: Self-pay | Admitting: Emergency Medicine

## 2013-12-18 VITALS — BP 150/80 | HR 86 | Temp 98.1°F | Resp 16 | Ht 69.0 in | Wt 167.0 lb

## 2013-12-18 DIAGNOSIS — Z139 Encounter for screening, unspecified: Secondary | ICD-10-CM

## 2013-12-18 DIAGNOSIS — M7989 Other specified soft tissue disorders: Secondary | ICD-10-CM

## 2013-12-18 DIAGNOSIS — R0989 Other specified symptoms and signs involving the circulatory and respiratory systems: Secondary | ICD-10-CM

## 2013-12-18 DIAGNOSIS — L03312 Cellulitis of back [any part except buttock]: Secondary | ICD-10-CM

## 2013-12-18 DIAGNOSIS — C61 Malignant neoplasm of prostate: Secondary | ICD-10-CM

## 2013-12-18 DIAGNOSIS — L02219 Cutaneous abscess of trunk, unspecified: Secondary | ICD-10-CM

## 2013-12-18 DIAGNOSIS — Z0000 Encounter for general adult medical examination without abnormal findings: Secondary | ICD-10-CM

## 2013-12-18 DIAGNOSIS — I10 Essential (primary) hypertension: Secondary | ICD-10-CM

## 2013-12-18 DIAGNOSIS — E785 Hyperlipidemia, unspecified: Secondary | ICD-10-CM

## 2013-12-18 DIAGNOSIS — L03319 Cellulitis of trunk, unspecified: Secondary | ICD-10-CM

## 2013-12-18 DIAGNOSIS — R002 Palpitations: Secondary | ICD-10-CM

## 2013-12-18 DIAGNOSIS — D369 Benign neoplasm, unspecified site: Secondary | ICD-10-CM

## 2013-12-18 LAB — CBC WITH DIFFERENTIAL/PLATELET
Basophils Absolute: 0 K/uL (ref 0.0–0.1)
Basophils Relative: 0 % (ref 0–1)
Eosinophils Absolute: 0 K/uL (ref 0.0–0.7)
Eosinophils Relative: 0 % (ref 0–5)
HCT: 44.5 % (ref 39.0–52.0)
Hemoglobin: 15.6 g/dL (ref 13.0–17.0)
Lymphocytes Relative: 28 % (ref 12–46)
Lymphs Abs: 1.5 K/uL (ref 0.7–4.0)
MCH: 30.9 pg (ref 26.0–34.0)
MCHC: 35.1 g/dL (ref 30.0–36.0)
MCV: 88.1 fL (ref 78.0–100.0)
Monocytes Absolute: 0.5 K/uL (ref 0.1–1.0)
Monocytes Relative: 9 % (ref 3–12)
Neutro Abs: 3.3 K/uL (ref 1.7–7.7)
Neutrophils Relative %: 63 % (ref 43–77)
Platelets: 225 K/uL (ref 150–400)
RBC: 5.05 MIL/uL (ref 4.22–5.81)
RDW: 14.1 % (ref 11.5–15.5)
WBC: 5.3 K/uL (ref 4.0–10.5)

## 2013-12-18 LAB — COMPLETE METABOLIC PANEL WITHOUT GFR
ALT: 13 U/L (ref 0–53)
AST: 15 U/L (ref 0–37)
Albumin: 4.8 g/dL (ref 3.5–5.2)
Alkaline Phosphatase: 60 U/L (ref 39–117)
BUN: 11 mg/dL (ref 6–23)
CO2: 29 meq/L (ref 19–32)
Calcium: 9.3 mg/dL (ref 8.4–10.5)
Chloride: 97 meq/L (ref 96–112)
Creat: 1.1 mg/dL (ref 0.50–1.35)
GFR, Est African American: 79 mL/min
GFR, Est Non African American: 69 mL/min
Glucose, Bld: 102 mg/dL — ABNORMAL HIGH (ref 70–99)
Potassium: 3.7 meq/L (ref 3.5–5.3)
Sodium: 137 meq/L (ref 135–145)
Total Bilirubin: 1.1 mg/dL (ref 0.2–1.2)
Total Protein: 7.9 g/dL (ref 6.0–8.3)

## 2013-12-18 LAB — LIPID PANEL
CHOLESTEROL: 197 mg/dL (ref 0–200)
HDL: 49 mg/dL (ref >39)
LDL Cholesterol: 123 mg/dL — ABNORMAL HIGH (ref 0–99)
TRIGLYCERIDES: 123 mg/dL (ref <150)
Total CHOL/HDL Ratio: 4 Ratio
VLDL: 25 mg/dL (ref 0–40)

## 2013-12-18 LAB — IFOBT (OCCULT BLOOD): IFOBT: NEGATIVE

## 2013-12-18 MED ORDER — HYDROCHLOROTHIAZIDE 12.5 MG PO CAPS: 12.5000 mg | ORAL_CAPSULE | Freq: Every day | ORAL | Status: DC

## 2013-12-18 MED ORDER — PRAVASTATIN SODIUM 40 MG PO TABS: ORAL_TABLET | ORAL | Status: DC

## 2013-12-18 MED ORDER — AMLODIPINE BESYLATE 10 MG PO TABS: 10.0000 mg | ORAL_TABLET | Freq: Every day | ORAL | Status: DC

## 2013-12-18 MED ORDER — LISINOPRIL 40 MG PO TABS: 40.0000 mg | ORAL_TABLET | Freq: Every day | ORAL | Status: DC

## 2013-12-18 NOTE — Progress Notes (Signed)
   Subjective:    Patient ID: Martin Edwards, male    DOB: 02-04-1946, 68 y.o.   MRN: [id number]  HPI    Review of Systems  Constitutional: Negative.   HENT: Negative.   Eyes: Negative.   Respiratory: Negative.   Cardiovascular: Negative.   Gastrointestinal: Negative.   Endocrine: Negative.   Genitourinary: Negative.   Musculoskeletal: Negative.   Skin: Negative.   Allergic/Immunologic: Negative.   Neurological: Negative.   Hematological: Negative.   Psychiatric/Behavioral: Negative.        Objective:   Physical Exam H. EENT exam is unremarkable. Neck exam reveals a left carotid bruit. Chest clear to auscultation and percussion. Heart rate her rate no murmurs. Abdomen soft nontender. Rectal reveals a normal exam without masses. Extremities without cyanosis clubbing or edema Examination the back reveals a 2 x 3 cm draining cystic area which does have a lipomatous feel over his lower back  EKG THERE are nonspecific diffuse ST-T changes. This EKG is very similar to the EKG year ago.     Assessment & Plan:  Referral made to Gen. surgery for with a cystic lesion on his back. Referral made for carotid Doppler for a left carotid bruit. Referral made to cardiology because of his abnormal EKG. All meds were refilled . Referral also made to GI because patient had a right colon resection for an adenomatous polyp and has not had GI followup since his surgery.

## 2013-12-19 LAB — PSA, MEDICARE: PSA: 0.37 ng/mL (ref <=4.00)

## 2013-12-20 LAB — WOUND CULTURE
Gram Stain: NONE SEEN
Gram Stain: NONE SEEN
Gram Stain: NONE SEEN

## 2014-01-04 ENCOUNTER — Ambulatory Visit (INDEPENDENT_AMBULATORY_CARE_PROVIDER_SITE_OTHER): Payer: Medicare Other | Admitting: General Surgery

## 2014-01-04 ENCOUNTER — Encounter (INDEPENDENT_AMBULATORY_CARE_PROVIDER_SITE_OTHER): Payer: Self-pay | Admitting: General Surgery

## 2014-01-04 VITALS — BP 132/74 | HR 71 | Temp 98.0°F | Ht 69.0 in | Wt 168.0 lb

## 2014-01-04 DIAGNOSIS — L723 Sebaceous cyst: Secondary | ICD-10-CM

## 2014-01-04 MED ORDER — DOXYCYCLINE HYCLATE 100 MG PO CAPS: 100.0000 mg | ORAL_CAPSULE | Freq: Two times a day (BID) | ORAL | Status: DC

## 2014-01-04 NOTE — Patient Instructions (Signed)
Abscess Care After An abscess (also called a boil or furuncle) is an infected area that contains a collection of pus. Signs and symptoms of an abscess include pain, tenderness, redness, or hardness, or you may feel a moveable soft area under your skin. An abscess can occur anywhere in the body. The infection may spread to surrounding tissues causing cellulitis. A cut (incision) by the surgeon was made over your abscess and the pus was drained out. Gauze may have been packed into the space to provide a drain that will allow the cavity to heal from the inside outwards. The boil may be painful for 5 to 7 days. Most people with a boil do not have high fevers. Your abscess, if seen early, may not have localized, and may not have been lanced. If not, another appointment may be required for this if it does not get better on its own or with medications.  HOME CARE INSTRUCTIONS   Only take over-the-counter or prescription medicines for pain, discomfort, or fever as directed by your caregiver.   When you bathe, soak and then remove gauze . You may then wash the wound gently with mild soapy water. Cover with gauze as needed.   SEEK IMMEDIATE MEDICAL CARE IF:   You develop increased pain, swelling, redness, drainage, or bleeding in the wound site.   You develop signs of generalized infection including muscle aches, chills, fever, or a general ill feeling.   An oral temperature above 102 F (38.9 C) develops, not controlled by medication.  See your caregiver for a recheck if you develop any of the symptoms described above. If medications (antibiotics) were prescribed, take them as directed.

## 2014-01-04 NOTE — Progress Notes (Signed)
Patient ID: OTHER ATIENZA, male   DOB: [birth year], 68 y.o.   MRN: [id number]  Chief Complaint  Patient presents with  . eval cyst back/chest    HPI Martin Edwards is a 68 y.o. male.   HPI 68 year old Caucasian male referred by Dr. Arlyss Queen for evaluation of some sebaceous cysts. The patient states he has one on his chest that doesn't really bother him. He has one on his right lower back that has been there for at least 15 years. A few weeks ago it became swollen, firm and tender. He backed up to a mirror and squeezed it and got a lot of yellowish fluid out of it. He states it is still swollen but not near as much as it was the other week. He denies any fevers or chills. He has upcoming appointment with GI and cardiology. Past Medical History  Diagnosis Date  . Hypertension   . Hyperlipidemia   . Cancer     Past Surgical History  Procedure Laterality Date  . Prostate surgery  05/20/1998  . Colon surgery      Family History  Problem Relation Age of Onset  . Cancer Father     Lung    Social History History  Substance Use Topics  . Smoking status: Never Smoker   . Smokeless tobacco: Not on file  . Alcohol Use: No     Comment: very little    No Known Allergies  Current Outpatient Prescriptions  Medication Sig Dispense Refill  . amLODipine (NORVASC) 10 MG tablet Take 1 tablet (10 mg total) by mouth daily.  90 tablet  3  . hydrochlorothiazide (MICROZIDE) 12.5 MG capsule Take 1 capsule (12.5 mg total) by mouth daily.  90 capsule  3  . lisinopril (PRINIVIL,ZESTRIL) 40 MG tablet Take 1 tablet (40 mg total) by mouth daily.  90 tablet  3  . pravastatin (PRAVACHOL) 40 MG tablet TAKE 1 TABLET BY MOUTH EVERY DAY  90 tablet  3  . doxycycline (VIBRAMYCIN) 100 MG capsule Take 1 capsule (100 mg total) by mouth 2 (two) times daily.  5 capsule  10   No current facility-administered medications for this visit.    Review of Systems Review of Systems  Constitutional: Negative for  fever, activity change, appetite change and unexpected weight change.  HENT: Negative for nosebleeds and trouble swallowing.   Eyes: Negative for photophobia and visual disturbance.  Respiratory: Negative for chest tightness and shortness of breath.   Cardiovascular: Negative for chest pain and leg swelling.       Denies CP, SOB, orthopnea, PND, DOE  Genitourinary: Negative for dysuria and difficulty urinating.  Musculoskeletal: Negative for arthralgias.  Skin: Negative for pallor and rash.  Neurological: Negative for dizziness, seizures, facial asymmetry and numbness.       Denies TIA and amaurosis fugax   Hematological: Negative for adenopathy. Does not bruise/bleed easily.  Psychiatric/Behavioral: Negative for behavioral problems and agitation.    Blood pressure 132/74, pulse 71, temperature 98 F (36.7 C), height 5\' 9"  (1.753 m), weight 168 lb (76.204 kg).  Physical Exam Physical Exam  Vitals reviewed. Constitutional: He is oriented to person, place, and time. He appears well-developed and well-nourished. No distress.  HENT:  Head: Normocephalic and atraumatic.  Right Ear: External ear normal.  Left Ear: External ear normal.  Eyes: Conjunctivae are normal. No scleral icterus.  Neck: Normal range of motion. Neck supple. Carotid bruit is present (mild). No tracheal deviation present. No thyromegaly present.  Cardiovascular: Normal rate, normal heart sounds and intact distal pulses.   Pulmonary/Chest: Effort normal and breath sounds normal. No respiratory distress. He has no wheezes.    Chest wall sebaceous cyst 1.5 cm x 1.5cm; Well circumscribed, soft, nontender, no cellulitis or fluctuant,  Musculoskeletal: Normal range of motion. He exhibits no edema and no tenderness.  Lymphadenopathy:    He has no cervical adenopathy.  Neurological: He is alert and oriented to person, place, and time. He exhibits normal muscle tone.  Skin: Skin is warm and dry. No rash noted. He is not  diaphoretic. No erythema. No pallor.     Right lower back sebaceous cyst measuring 4 cm wide by 3.5 centimeters tall. Central punctum. Mild tenderness. Subtle cellulitis. No fluctuance. Well-circumscribed  Psychiatric: He has a normal mood and affect. His behavior is normal. Judgment and thought content normal.    Data Reviewed Dr Everlene Farrier office note  Assessment    Epidermoid inclusion cyst (sebaceous cyst) of chest and Right lower back     Plan    We discussed the etiology and management of sebaceous cysts (epidermoid inclusion cysts). The patient was given educational material. We discussed that these lesions can become infected at times. We discussed the signs and symptoms of infection. We discussed the only way to eliminate these lesions is to surgically excise the cyst and its wall in its entirety.   We discussed observation versus surgical excision. We discussed the risks and benefits of surgery including but not limited to bleeding, infection, injury to surrounding structures, scarring, cosmetic concerns, possible temporary drain placement, blood clot formation, anesthesia issues, possible recurrence, and the typical postoperative course.   Right now the patient is not interested in definitive surgical excision. I advised him that the right lower back cyst would probably have a recurrent infection sooner rather than later. We discussed lancing it in order to decrease an early recurrence.  The patient has elected to Undergo lancing of the right lower back epidermal inclusion cyst in the office today.  After obtaining verbal consent, the area was prepped with Chloraprep, 3 cc of 1% Xylocaine with epinephrine was infiltrated over the maximal area around the central punctum. A 1 cm incision was made through the skin and deep dermis. I was able to evacuate a large amount of keratin material. None of the sac was able to be removed. There was minimal bleeding. A dry gauze is placed on top. He  was given wound care instructions.  He was instructed on what to call for. Because of the mild cellulitis I am going to place him on 5 days of doxycycline. All of his questions were asked and answered. Followup as needed  Leighton Ruff. Redmond Pulling, MD, FACS General, Bariatric, & Minimally Invasive Surgery [facility], [place]          [facility] 01/04/2014, 4:26 PM

## 2014-01-30 ENCOUNTER — Inpatient Hospital Stay (HOSPITAL_COMMUNITY)
Admission: EM | Admit: 2014-01-30 | Discharge: 2014-02-02 | DRG: 390 | Disposition: A | Discharge status: Home / Self Care | Payer: Medicare Other | Attending: General Surgery | Admitting: General Surgery

## 2014-01-30 ENCOUNTER — Ambulatory Visit (INDEPENDENT_AMBULATORY_CARE_PROVIDER_SITE_OTHER): Payer: Medicare Other

## 2014-01-30 ENCOUNTER — Ambulatory Visit (INDEPENDENT_AMBULATORY_CARE_PROVIDER_SITE_OTHER): Payer: Medicare Other | Admitting: Emergency Medicine

## 2014-01-30 ENCOUNTER — Encounter (HOSPITAL_COMMUNITY): Payer: Self-pay | Admitting: Emergency Medicine

## 2014-01-30 ENCOUNTER — Emergency Department (HOSPITAL_COMMUNITY): Payer: Medicare Other

## 2014-01-30 VITALS — BP 118/64 | HR 94 | Temp 98.0°F | Resp 16 | Ht 69.0 in | Wt 166.0 lb

## 2014-01-30 DIAGNOSIS — E785 Hyperlipidemia, unspecified: Secondary | ICD-10-CM | POA: Diagnosis present

## 2014-01-30 DIAGNOSIS — Z8546 Personal history of malignant neoplasm of prostate: Secondary | ICD-10-CM | POA: Diagnosis not present

## 2014-01-30 DIAGNOSIS — K56609 Unspecified intestinal obstruction, unspecified as to partial versus complete obstruction: Secondary | ICD-10-CM

## 2014-01-30 DIAGNOSIS — R112 Nausea with vomiting, unspecified: Secondary | ICD-10-CM

## 2014-01-30 DIAGNOSIS — Z87442 Personal history of urinary calculi: Secondary | ICD-10-CM

## 2014-01-30 DIAGNOSIS — R109 Unspecified abdominal pain: Secondary | ICD-10-CM

## 2014-01-30 DIAGNOSIS — Z801 Family history of malignant neoplasm of trachea, bronchus and lung: Secondary | ICD-10-CM

## 2014-01-30 DIAGNOSIS — I10 Essential (primary) hypertension: Secondary | ICD-10-CM | POA: Diagnosis present

## 2014-01-30 DIAGNOSIS — K565 Intestinal adhesions [bands], unspecified as to partial versus complete obstruction: Secondary | ICD-10-CM | POA: Diagnosis present

## 2014-01-30 HISTORY — DX: Basal cell carcinoma of skin of unspecified parts of face: C44.310

## 2014-01-30 HISTORY — DX: Intestinal adhesions (bands), unspecified as to partial versus complete obstruction: K56.50

## 2014-01-30 HISTORY — DX: Malignant neoplasm of prostate: C61

## 2014-01-30 LAB — CBC WITH DIFFERENTIAL/PLATELET
Basophils Absolute: 0 10*3/uL (ref 0.0–0.1)
Basophils Relative: 0 % (ref 0–1)
EOS PCT: 0 % (ref 0–5)
Eosinophils Absolute: 0 10*3/uL (ref 0.0–0.7)
HEMATOCRIT: 45.7 % (ref 39.0–52.0)
HEMOGLOBIN: 16 g/dL (ref 13.0–17.0)
Lymphocytes Relative: 5 % — ABNORMAL LOW (ref 12–46)
Lymphs Abs: 0.5 10*3/uL — ABNORMAL LOW (ref 0.7–4.0)
MCH: 31.6 pg (ref 26.0–34.0)
MCHC: 35 g/dL (ref 30.0–36.0)
MCV: 90.3 fL (ref 78.0–100.0)
MONO ABS: 0.5 10*3/uL (ref 0.1–1.0)
MONOS PCT: 4 % (ref 3–12)
Neutro Abs: 10.3 10*3/uL — ABNORMAL HIGH (ref 1.7–7.7)
Neutrophils Relative %: 91 % — ABNORMAL HIGH (ref 43–77)
Platelets: 228 10*3/uL (ref 150–400)
RBC: 5.06 MIL/uL (ref 4.22–5.81)
RDW: 13.2 % (ref 11.5–15.5)
WBC: 11.4 10*3/uL — ABNORMAL HIGH (ref 4.0–10.5)

## 2014-01-30 LAB — COMPREHENSIVE METABOLIC PANEL
ALT: 14 U/L (ref 0–53)
AST: 18 U/L (ref 0–37)
Albumin: 4.3 g/dL (ref 3.5–5.2)
Alkaline Phosphatase: 68 U/L (ref 39–117)
Anion gap: 17 — ABNORMAL HIGH (ref 5–15)
BUN: 12 mg/dL (ref 6–23)
CALCIUM: 11.3 mg/dL — AB (ref 8.4–10.5)
CO2: 30 meq/L (ref 19–32)
CREATININE: 1.13 mg/dL (ref 0.50–1.35)
Chloride: 94 mEq/L — ABNORMAL LOW (ref 96–112)
GFR, EST AFRICAN AMERICAN: 75 mL/min — AB (ref >90)
GFR, EST NON AFRICAN AMERICAN: 65 mL/min — AB (ref >90)
Glucose, Bld: 198 mg/dL — ABNORMAL HIGH (ref 70–99)
Potassium: 4.6 mEq/L (ref 3.7–5.3)
Sodium: 141 mEq/L (ref 137–147)
Total Bilirubin: 1.2 mg/dL (ref 0.3–1.2)
Total Protein: 8.2 g/dL (ref 6.0–8.3)

## 2014-01-30 LAB — POCT CBC
Granulocyte percent: 88.1 %G — AB (ref 37–80)
HCT, POC: 50.3 % (ref 43.5–53.7)
HEMOGLOBIN: 16.5 g/dL (ref 14.1–18.1)
LYMPH, POC: 0.8 (ref 0.6–3.4)
MCH: 30.6 pg (ref 27–31.2)
MCHC: 32.7 g/dL (ref 31.8–35.4)
MCV: 93.4 fL (ref 80–97)
MID (CBC): 0.6 (ref 0–0.9)
MPV: 9.3 fL (ref 0–99.8)
POC Granulocyte: 10.3 — AB (ref 2–6.9)
POC LYMPH %: 6.9 % — AB (ref 10–50)
POC MID %: 5 % (ref 0–12)
Platelet Count, POC: 237 10*3/uL (ref 142–424)
RBC: 5.39 M/uL (ref 4.69–6.13)
RDW, POC: 14 %
WBC: 11.7 10*3/uL — AB (ref 4.6–10.2)

## 2014-01-30 LAB — URINALYSIS, ROUTINE W REFLEX MICROSCOPIC
BILIRUBIN URINE: NEGATIVE
Glucose, UA: 100 mg/dL — AB
Ketones, ur: 15 mg/dL — AB
Leukocytes, UA: NEGATIVE
Nitrite: NEGATIVE
PROTEIN: 100 mg/dL — AB
Specific Gravity, Urine: 1.027 (ref 1.005–1.030)
UROBILINOGEN UA: 0.2 mg/dL (ref 0.0–1.0)
pH: 6 (ref 5.0–8.0)

## 2014-01-30 LAB — URINE MICROSCOPIC-ADD ON

## 2014-01-30 LAB — LIPASE, BLOOD: Lipase: 23 U/L (ref 11–59)

## 2014-01-30 MED ORDER — IOHEXOL 300 MG/ML  SOLN
100.0000 mL | Freq: Once | INTRAMUSCULAR | Status: AC | PRN
  Administered 2014-01-30: 100 mL via INTRAVENOUS

## 2014-01-30 MED ORDER — ONDANSETRON 4 MG PO TBDP: 4.0000 mg | ORAL_TABLET | Freq: Once | ORAL | Status: DC

## 2014-01-30 MED ORDER — ONDANSETRON HCL 4 MG/2ML IJ SOLN
4.0000 mg | Freq: Once | INTRAMUSCULAR | Status: AC
Start: 2014-01-30 — End: 2014-01-30
  Administered 2014-01-30: 4 mg via INTRAVENOUS
  Filled 2014-01-30: qty 2

## 2014-01-30 MED ORDER — HYDROMORPHONE HCL PF 1 MG/ML IJ SOLN
1.0000 mg | Freq: Once | INTRAMUSCULAR | Status: AC
  Administered 2014-01-30: 1 mg via INTRAVENOUS
  Filled 2014-01-30: qty 1

## 2014-01-30 MED ORDER — HYDROMORPHONE HCL PF 1 MG/ML IJ SOLN: 1.0000 mg | INTRAMUSCULAR | Status: DC | PRN

## 2014-01-30 MED ORDER — HYDRALAZINE HCL 20 MG/ML IJ SOLN: 5.0000 mg | INTRAMUSCULAR | Status: DC | PRN

## 2014-01-30 MED ORDER — METOPROLOL TARTRATE 1 MG/ML IV SOLN: 5.0000 mg | Freq: Four times a day (QID) | INTRAVENOUS | Status: DC | PRN

## 2014-01-30 MED ORDER — ENOXAPARIN SODIUM 40 MG/0.4ML ~~LOC~~ SOLN
40.0000 mg | SUBCUTANEOUS | Status: DC
  Administered 2014-01-30 – 2014-02-01 (×3): 40 mg via SUBCUTANEOUS
  Filled 2014-01-30 (×4): qty 0.4

## 2014-01-30 MED ORDER — IOHEXOL 300 MG/ML  SOLN
25.0000 mL | Freq: Once | INTRAMUSCULAR | Status: AC | PRN
  Administered 2014-01-30: 25 mL via ORAL

## 2014-01-30 MED ORDER — DIPHENHYDRAMINE HCL 12.5 MG/5ML PO ELIX
12.5000 mg | ORAL_SOLUTION | Freq: Four times a day (QID) | ORAL | Status: DC | PRN
  Filled 2014-01-30: qty 5

## 2014-01-30 MED ORDER — ONDANSETRON HCL 4 MG/2ML IJ SOLN: 4.0000 mg | Freq: Four times a day (QID) | INTRAMUSCULAR | Status: DC | PRN

## 2014-01-30 MED ORDER — DIPHENHYDRAMINE HCL 50 MG/ML IJ SOLN: 12.5000 mg | Freq: Four times a day (QID) | INTRAMUSCULAR | Status: DC | PRN

## 2014-01-30 MED ORDER — KCL IN DEXTROSE-NACL 20-5-0.45 MEQ/L-%-% IV SOLN
INTRAVENOUS | Status: DC
  Administered 2014-01-30 – 2014-02-01 (×5): via INTRAVENOUS
  Filled 2014-01-30 (×7): qty 1000

## 2014-01-30 MED ORDER — PANTOPRAZOLE SODIUM 40 MG IV SOLR
40.0000 mg | Freq: Every day | INTRAVENOUS | Status: DC
  Administered 2014-01-30 – 2014-02-01 (×3): 40 mg via INTRAVENOUS
  Filled 2014-01-30 (×4): qty 40

## 2014-01-30 MED ORDER — SODIUM CHLORIDE 0.9 % IV BOLUS (SEPSIS)
1000.0000 mL | Freq: Once | INTRAVENOUS | Status: AC
  Administered 2014-01-30: 1000 mL via INTRAVENOUS

## 2014-01-30 NOTE — Addendum Note (Signed)
Addended by: Frutoso Chase A on: 01/30/2014 01:00 PM   Modules accepted: Orders

## 2014-01-30 NOTE — H&P (Addendum)
Martin Edwards is an 68 y.o. male.   Chief Complaint: Abdominal pain, nausea and vomiting HPI: I have known the patient since 2012,  Colectomy was done at that time.  He had been doing well until yesterday 1930 when he developed severe abdominal pain across the periumbilical area, radiating to his back, associated with nausea and vomiting.  Since this started he has vomited 3 times and burped, but no bowel movement.  Last BM two days ago.  CT scan demonstrates high grade partial bowel obstruction.  Past Medical History  Diagnosis Date  . Hypertension   . Hyperlipidemia   . Cancer   . Prostate cancer     Past Surgical History  Procedure Laterality Date  . Prostate surgery  05/20/1998  . Colon surgery      Family History  Problem Relation Age of Onset  . Cancer Father     Lung   Social History:  reports that he has never smoked. He does not have any smokeless tobacco history on file. He reports that he does not drink alcohol or use illicit drugs.  Allergies: No Known Allergies   (Not in a hospital admission)  Results for orders placed during the hospital encounter of 01/30/14 (from the past 48 hour(s))  COMPREHENSIVE METABOLIC PANEL     Status: Abnormal   Collection Time    01/30/14 12:53 PM      Result Value Ref Range   Sodium 141  137 - 147 mEq/L   Potassium 4.6  3.7 - 5.3 mEq/L   Chloride 94 (*) 96 - 112 mEq/L   CO2 30  19 - 32 mEq/L   Glucose, Bld 198 (*) 70 - 99 mg/dL   BUN 12  6 - 23 mg/dL   Creatinine, Ser 1.13  0.50 - 1.35 mg/dL   Calcium 11.3 (*) 8.4 - 10.5 mg/dL   Total Protein 8.2  6.0 - 8.3 g/dL   Albumin 4.3  3.5 - 5.2 g/dL   AST 18  0 - 37 U/L   ALT 14  0 - 53 U/L   Alkaline Phosphatase 68  39 - 117 U/L   Total Bilirubin 1.2  0.3 - 1.2 mg/dL   GFR calc non Af Amer 65 (*) >90 mL/min   GFR calc Af Amer 75 (*) >90 mL/min   Comment: (NOTE)     The eGFR has been calculated using the CKD EPI equation.     This calculation has not been validated in all  clinical situations.     eGFR's persistently <90 mL/min signify possible Chronic Kidney     Disease.   Anion gap 17 (*) 5 - 15  CBC WITH DIFFERENTIAL     Status: Abnormal   Collection Time    01/30/14  1:32 PM      Result Value Ref Range   WBC 11.4 (*) 4.0 - 10.5 K/uL   RBC 5.06  4.22 - 5.81 MIL/uL   Hemoglobin 16.0  13.0 - 17.0 g/dL   HCT 45.7  39.0 - 52.0 %   MCV 90.3  78.0 - 100.0 fL   MCH 31.6  26.0 - 34.0 pg   MCHC 35.0  30.0 - 36.0 g/dL   RDW 13.2  11.5 - 15.5 %   Platelets 228  150 - 400 K/uL   Neutrophils Relative % 91 (*) 43 - 77 %   Neutro Abs 10.3 (*) 1.7 - 7.7 K/uL   Lymphocytes Relative 5 (*) 12 - 46 %  Lymphs Abs 0.5 (*) 0.7 - 4.0 K/uL   Monocytes Relative 4  3 - 12 %   Monocytes Absolute 0.5  0.1 - 1.0 K/uL   Eosinophils Relative 0  0 - 5 %   Eosinophils Absolute 0.0  0.0 - 0.7 K/uL   Basophils Relative 0  0 - 1 %   Basophils Absolute 0.0  0.0 - 0.1 K/uL  LIPASE, BLOOD     Status: None   Collection Time    01/30/14  1:32 PM      Result Value Ref Range   Lipase 23  11 - 59 U/L  URINALYSIS, ROUTINE W REFLEX MICROSCOPIC     Status: Abnormal   Collection Time    01/30/14  2:50 PM      Result Value Ref Range   Color, Urine AMBER (*) YELLOW   Comment: BIOCHEMICALS MAY BE AFFECTED BY COLOR   APPearance CLOUDY (*) CLEAR   Specific Gravity, Urine 1.027  1.005 - 1.030   pH 6.0  5.0 - 8.0   Glucose, UA 100 (*) NEGATIVE mg/dL   Hgb urine dipstick MODERATE (*) NEGATIVE   Bilirubin Urine NEGATIVE  NEGATIVE   Ketones, ur 15 (*) NEGATIVE mg/dL   Protein, ur 100 (*) NEGATIVE mg/dL   Urobilinogen, UA 0.2  0.0 - 1.0 mg/dL   Nitrite NEGATIVE  NEGATIVE   Leukocytes, UA NEGATIVE  NEGATIVE  URINE MICROSCOPIC-ADD ON     Status: Abnormal   Collection Time    01/30/14  2:50 PM      Result Value Ref Range   WBC, UA 0-2  <3 WBC/hpf   RBC / HPF 7-10  <3 RBC/hpf   Bacteria, UA RARE  RARE   Crystals CA OXALATE CRYSTALS (*) NEGATIVE   Urine-Other MUCOUS PRESENT     Ct  Abdomen Pelvis W Contrast  01/30/2014   CLINICAL DATA:  Diffuse abdominal pain radiating to back.  Vomiting.  EXAM: CT ABDOMEN AND PELVIS WITH CONTRAST  TECHNIQUE: Multidetector CT imaging of the abdomen and pelvis was performed using the standard protocol following bolus administration of intravenous contrast.  CONTRAST:  159m OMNIPAQUE IOHEXOL 300 MG/ML  SOLN  COMPARISON:  01/14/2006  FINDINGS: Linear subsegmental atelectasis in the lung bases. Heart is normal size. No effusions.  Stomach is distended with gas and fluid. Postoperative changes noted in the right colon. Several loops of small bowel are dilated in the abdomen. The terminal ileum and distal small bowel are decompressed. Exact transition point is not visualized. Findings concerning for partial small bowel obstruction.  Small to moderate free fluid in the pelvis. No free air or adenopathy.  Liver, spleen, pancreas, adrenals and left kidney are unremarkable. 4 mm nonobstructing right renal stone in a midpole calyx. No new hydronephrosis.  Aorta and iliac vessels are heavily calcified, non aneurysmal.  Sigmoid diverticulosis.  No active diverticulitis.  IMPRESSION: Dilated small bowel loops and stomach concerning for small bowel obstruction. Distal small bowel loops are decompressed. Exact transition point not visualized. Small to moderate free fluid in the pelvis.  Postoperative changes in the right colon and at the terminal ileum/ileocecal valve.  Right nephrolithiasis.  No hydronephrosis.   Electronically Signed   By: KRolm BaptiseM.D.   On: 01/30/2014 17:13   Dg Abd Acute W/chest  01/30/2014   CLINICAL DATA:  Abdominal pain, vomiting  EXAM: ACUTE ABDOMEN SERIES (ABDOMEN 2 VIEW & CHEST 1 VIEW)  COMPARISON:  Chest x-ray of 10/20/2010  FINDINGS: No active infiltrate or  effusion is seen. Mediastinal and hilar contours are unremarkable. The heart is within normal limits in size.  Supine and erect views of the abdomen show dilated loops of small bowel  with air-fluid levels consistent with a partial small bowel obstruction. No definite colonic bowel gas is seen. No free air is noted. CT of the abdomen pelvis is recommended to assess for point of obstruction. Surgical clips are noted within the pelvic sidewalls. There are degenerative changes in the lower lumbar spine.  IMPRESSION: 1. Dilated small bowel with air-fluid levels consistent with partial small bowel obstruction. No free air. Recommend CT of the abdomen pelvis with IV contrast to assess further. 2. No active cardiopulmonary disease. 3. I discussed the findings of this study with Dr. Everlene Farrier at 12:11 p.m. on 01/30/2014   Electronically Signed   By: Ivar Drape M.D.   On: 01/30/2014 12:12    Review of Systems  Constitutional: Positive for chills. Negative for fever.  HENT: Negative.   Eyes: Negative.   Respiratory: Negative.   Cardiovascular: Negative.   Gastrointestinal: Positive for nausea, vomiting and abdominal pain.  Genitourinary: Negative.   Musculoskeletal: Negative.   Skin: Negative.   Neurological: Negative.   Endo/Heme/Allergies: Negative.     Blood pressure 159/83, pulse 100, temperature 98.9 F (37.2 C), temperature source Oral, resp. rate 20, SpO2 97.00%. Physical Exam  Constitutional: He is oriented to person, place, and time. He appears well-developed and well-nourished.  HENT:  Head: Normocephalic and atraumatic.  Eyes: Conjunctivae are normal. Pupils are equal, round, and reactive to light.  Cardiovascular: Normal rate, regular rhythm, normal heart sounds and intact distal pulses.   Respiratory: Effort normal and breath sounds normal.  GI: Soft. He exhibits distension. He exhibits no shifting dullness. Bowel sounds are decreased. There is generalized tenderness (mild). There is no rigidity, no rebound and no guarding.    Tinkles and rushes  Musculoskeletal: Normal range of motion.  Neurological: He is alert and oriented to person, place, and time.  Skin: Skin  is warm and dry.  Psychiatric: He has a normal mood and affect. His behavior is normal. Judgment and thought content normal.     Assessment/Plan SBO  NGT to low intermittent suction. IV hydration. IV metoprolol for hypertension Hydralazine prn for HTN.  Surgery in 48 hours if not resolved.  Gwenyth Ober 01/30/2014, 6:09 PM

## 2014-01-30 NOTE — ED Notes (Signed)
Communication with CT pt finished with Contrast.

## 2014-01-30 NOTE — ED Provider Notes (Signed)
CSN: [id number]     Arrival date & time 01/30/14  1240 History   First MD Initiated Contact with Patient 01/30/14 1325     Chief Complaint  Patient presents with  . Abdominal Pain     (Consider location/radiation/quality/duration/timing/severity/associated sxs/prior Treatment) HPI Comments: Patient is a 68 year old male with history of hypertension, hyperlipidemia, prostate cancer who presents to the emergency department today with sudden onset diffuse abdominal pain. The pain began at 7:30 last night after eating some questionable cantaloupe. He has had associated nausea and vomiting. He has had associated diaphoresis. Last BM was 2 days ago. He has subjective fever. He has prior history of kidney stones and reports this feels like that. He denies difficulty with urination, hematuria. 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 It was discovered to not be cancerous.   The history is provided by the patient. No language interpreter was used.    Past Medical History  Diagnosis Date  . Hypertension   . Hyperlipidemia   . Cancer    Past Surgical History  Procedure Laterality Date  . Prostate surgery  05/20/1998  . Colon surgery     Family History  Problem Relation Age of Onset  . Cancer Father     Lung   History  Substance Use Topics  . Smoking status: Never Smoker   . Smokeless tobacco: Not on file  . Alcohol Use: No     Comment: very little    Review of Systems  Constitutional: Positive for fever, chills and diaphoresis.  Respiratory: Negative for shortness of breath.   Cardiovascular: Negative for chest pain.  Gastrointestinal: Positive for nausea, vomiting, abdominal pain and abdominal distention.  Genitourinary: Negative for dysuria, urgency and hematuria.  All other systems reviewed and are negative.     Allergies  Review of patient's allergies indicates no known allergies.  Home Medications   Prior to Admission medications    Medication Sig Start Date End Date Taking? Authorizing Provider  amLODipine (NORVASC) 10 MG tablet Take 1 tablet (10 mg total) by mouth daily. 12/18/13   Darlyne Russian, MD  doxycycline (VIBRAMYCIN) 100 MG capsule Take 1 capsule (100 mg total) by mouth 2 (two) times daily. 01/04/14   Gayland Curry, MD  hydrochlorothiazide (MICROZIDE) 12.5 MG capsule Take 1 capsule (12.5 mg total) by mouth daily. 12/18/13   Darlyne Russian, MD  lisinopril (PRINIVIL,ZESTRIL) 40 MG tablet Take 1 tablet (40 mg total) by mouth daily. 12/18/13   Darlyne Russian, MD  pravastatin (PRAVACHOL) 40 MG tablet TAKE 1 TABLET BY MOUTH EVERY DAY 12/18/13   Darlyne Russian, MD   BP 159/83  Pulse 100  Temp(Src) 98.9 F (37.2 C) (Oral)  Resp 20  SpO2 97% Physical Exam  Nursing note and vitals reviewed. Constitutional: He is oriented to person, place, and time. He appears well-developed and well-nourished. No distress.  HENT:  Head: Normocephalic and atraumatic.  Right Ear: External ear normal.  Left Ear: External ear normal.  Nose: Nose normal.  Eyes: Conjunctivae are normal.  Neck: Normal range of motion. No tracheal deviation present.  Cardiovascular: Normal rate, regular rhythm and normal heart sounds.   Pulmonary/Chest: Effort normal and breath sounds normal. No stridor.  Abdominal: Soft. He exhibits distension. Bowel sounds are decreased. There is generalized tenderness. There is guarding (voluntary). There is no rigidity and no CVA tenderness.  Musculoskeletal: Normal range of motion.  Neurological: He is alert and oriented to person,  place, and time.  Skin: Skin is warm and dry. He is not diaphoretic.  Psychiatric: He has a normal mood and affect. His behavior is normal.    ED Course  Procedures (including critical care time) Labs Review Labs Reviewed  COMPREHENSIVE METABOLIC PANEL - Abnormal; Notable for the following:    Chloride 94 (*)    Glucose, Bld 198 (*)    Calcium 11.3 (*)    GFR calc non Af Amer 65 (*)    GFR  calc Af Amer 75 (*)    Anion gap 17 (*)    All other components within normal limits  CBC WITH DIFFERENTIAL - Abnormal; Notable for the following:    WBC 11.4 (*)    Neutrophils Relative % 91 (*)    Neutro Abs 10.3 (*)    Lymphocytes Relative 5 (*)    Lymphs Abs 0.5 (*)    All other components within normal limits  URINALYSIS, ROUTINE W REFLEX MICROSCOPIC - Abnormal; Notable for the following:    Color, Urine AMBER (*)    APPearance CLOUDY (*)    Glucose, UA 100 (*)    Hgb urine dipstick MODERATE (*)    Ketones, ur 15 (*)    Protein, ur 100 (*)    All other components within normal limits  URINE MICROSCOPIC-ADD ON - Abnormal; Notable for the following:    Crystals CA OXALATE CRYSTALS (*)    All other components within normal limits  URINE CULTURE  LIPASE, BLOOD    Imaging Review Ct Abdomen Pelvis W Contrast  01/30/2014   CLINICAL DATA:  Diffuse abdominal pain radiating to back.  Vomiting.  EXAM: CT ABDOMEN AND PELVIS WITH CONTRAST  TECHNIQUE: Multidetector CT imaging of the abdomen and pelvis was performed using the standard protocol following bolus administration of intravenous contrast.  CONTRAST:  121mL OMNIPAQUE IOHEXOL 300 MG/ML  SOLN  COMPARISON:  01/14/2006  FINDINGS: Linear subsegmental atelectasis in the lung bases. Heart is normal size. No effusions.  Stomach is distended with gas and fluid. Postoperative changes noted in the right colon. Several loops of small bowel are dilated in the abdomen. The terminal ileum and distal small bowel are decompressed. Exact transition point is not visualized. Findings concerning for partial small bowel obstruction.  Small to moderate free fluid in the pelvis. No free air or adenopathy.  Liver, spleen, pancreas, adrenals and left kidney are unremarkable. 4 mm nonobstructing right renal stone in a midpole calyx. No new hydronephrosis.  Aorta and iliac vessels are heavily calcified, non aneurysmal.  Sigmoid diverticulosis.  No active  diverticulitis.  IMPRESSION: Dilated small bowel loops and stomach concerning for small bowel obstruction. Distal small bowel loops are decompressed. Exact transition point not visualized. Small to moderate free fluid in the pelvis.  Postoperative changes in the right colon and at the terminal ileum/ileocecal valve.  Right nephrolithiasis.  No hydronephrosis.   Electronically Signed   By: Rolm Baptise M.D.   On: 01/30/2014 17:13   Dg Abd Acute W/chest  01/30/2014   CLINICAL DATA:  Abdominal pain, vomiting  EXAM: ACUTE ABDOMEN SERIES (ABDOMEN 2 VIEW & CHEST 1 VIEW)  COMPARISON:  Chest x-ray of 10/20/2010  FINDINGS: No active infiltrate or effusion is seen. Mediastinal and hilar contours are unremarkable. The heart is within normal limits in size.  Supine and erect views of the abdomen show dilated loops of small bowel with air-fluid levels consistent with a partial small bowel obstruction. No definite colonic bowel gas is seen. No  free air is noted. CT of the abdomen pelvis is recommended to assess for point of obstruction. Surgical clips are noted within the pelvic sidewalls. There are degenerative changes in the lower lumbar spine.  IMPRESSION: 1. Dilated small bowel with air-fluid levels consistent with partial small bowel obstruction. No free air. Recommend CT of the abdomen pelvis with IV contrast to assess further. 2. No active cardiopulmonary disease. 3. I discussed the findings of this study with Dr. Everlene Farrier at 12:11 p.m. on 01/30/2014   Electronically Signed   By: Ivar Drape M.D.   On: 01/30/2014 12:12     EKG Interpretation None      MDM   Final diagnoses:  Small bowel obstruction    Patient presents to Ed with sudden onset abdominal pain. CT shows concern for early SBO. Dr. Hulen Skains to see patient. Admission is appreciated. Patient is hemodynamically stable. Patient / Family / Caregiver informed of clinical course, understand medical decision-making process, and agree with plan.    Elwyn Lade, PA-C 01/31/14 [phone]

## 2014-01-30 NOTE — ED Notes (Signed)
Patient transported to CT 

## 2014-01-30 NOTE — Progress Notes (Addendum)
   Subjective:  This chart was scribed for Darlyne Russian, MD by Ladene Artist, ED Scribe. The patient was seen in room 11. Patient's care was started at 11:24 AM.   Patient ID: Martin Edwards, male    DOB: 1945/08/29, 68 y.o.   MRN: [id number]  Chief Complaint  Patient presents with  . Back Pain    since last night  . Abdominal Pain   HPI HPI Comments: Martin Edwards is a 68 y.o. male who presents to the [facility] complaining of constant lower abdominal pain onset at 7:30 PM last night. Pt also reports lower back pain onset last night, 3 episodes of emesis, abdominal distention, subjective fever, sweats. Pt describes the pain as severe pressure and states that he can not get relief. Pain is exacerbated with moving. Last BM within the past 2 days, belching today. Pt had a tubular adenoma with resection in 2012, no cancer was found. Pt also reports h/o appendectomy and kidney stones. Pt denies h/o cholecystomy, pancreatitis. He also denies hematuria.    Past Medical History  Diagnosis Date  . Hypertension   . Hyperlipidemia   . Cancer    Past Surgical History  Procedure Laterality Date  . Prostate surgery  05/20/1998  . Colon surgery     Current Outpatient Prescriptions on File Prior to Visit  Medication Sig Dispense Refill  . amLODipine (NORVASC) 10 MG tablet Take 1 tablet (10 mg total) by mouth daily.  90 tablet  3  . hydrochlorothiazide (MICROZIDE) 12.5 MG capsule Take 1 capsule (12.5 mg total) by mouth daily.  90 capsule  3  . lisinopril (PRINIVIL,ZESTRIL) 40 MG tablet Take 1 tablet (40 mg total) by mouth daily.  90 tablet  3  . pravastatin (PRAVACHOL) 40 MG tablet TAKE 1 TABLET BY MOUTH EVERY DAY  90 tablet  3  . doxycycline (VIBRAMYCIN) 100 MG capsule Take 1 capsule (100 mg total) by mouth 2 (two) times daily.  5 capsule  10   No current facility-administered medications on file prior to visit.   No Known Allergies  Review of Systems  Constitutional:  Positive for fever (subjective).  Gastrointestinal: Positive for vomiting, abdominal pain and abdominal distention.  Genitourinary: Negative for hematuria.  Musculoskeletal: Positive for back pain.      Objective:   Physical Exam CONSTITUTIONAL: Well developed/well nourished HEAD: Normocephalic/atraumatic EYES: EOMI/PERRL ENMT: Mucous membranes moist NECK: supple no meningeal signs SPINE:entire spine nontender CV: S1/S2 noted, no murmurs/rubs/gallops noted LUNGS: Lungs are clear to auscultation bilaterally, no apparent distress ABDOMEN: large scar midline abdomen, abdomen is distended, bowel sounds are present, diffuse tenderness without rebound GU:no cva tenderness NEURO: Pt is awake/alert, moves all extremitiesx4 EXTREMITIES: pulses normal, full ROM SKIN: warm, color normal PSYCH: no abnormalities of mood noted   [facility] reading (PRIMARY) by  Dr.Jaquitta Dupriest is no free air. The stomach is filled with fluid. There is a large air-fluid level in the left upper abdomen. There is a paucity of air in the transverse and descending colon very suspicious for early small bowel obstruction  Assessment & Plan:  Am concerned the patient has an early small bowel obstruction related to previous colonic surgery in 2012. Patient will be sent to the hospital for further evaluation I did speak with Dr. Hulen Skains.. I personally performed the services described in this documentation, which was scribed in my presence. The recorded information has been reviewed and is accurate.

## 2014-01-30 NOTE — ED Notes (Signed)
PA at bedside.

## 2014-01-30 NOTE — ED Notes (Signed)
Pt presents to department for evaluation of diffuse abdominal pain radiating to lower back. Onset 7:30pm last night. Also reports nausea/vomiting. 10/10 pain upon arrival to ED. Pt is alert and oriented x4.

## 2014-01-30 NOTE — ED Notes (Signed)
Pt trying to urinate

## 2014-01-30 NOTE — ED Notes (Signed)
Pt requesting pain meds

## 2014-01-31 ENCOUNTER — Inpatient Hospital Stay (HOSPITAL_COMMUNITY): Payer: Medicare Other

## 2014-01-31 LAB — CBC
HCT: 39.2 % (ref 39.0–52.0)
HEMOGLOBIN: 13.4 g/dL (ref 13.0–17.0)
MCH: 31.2 pg (ref 26.0–34.0)
MCHC: 34.2 g/dL (ref 30.0–36.0)
MCV: 91.4 fL (ref 78.0–100.0)
PLATELETS: 188 10*3/uL (ref 150–400)
RBC: 4.29 MIL/uL (ref 4.22–5.81)
RDW: 13.5 % (ref 11.5–15.5)
WBC: 8.2 10*3/uL (ref 4.0–10.5)

## 2014-01-31 LAB — URINE CULTURE
COLONY COUNT: NO GROWTH
Culture: NO GROWTH

## 2014-01-31 LAB — BASIC METABOLIC PANEL
Anion gap: 11 (ref 5–15)
BUN: 11 mg/dL (ref 6–23)
CHLORIDE: 99 meq/L (ref 96–112)
CO2: 30 mEq/L (ref 19–32)
Calcium: 9.3 mg/dL (ref 8.4–10.5)
Creatinine, Ser: 1.14 mg/dL (ref 0.50–1.35)
GFR calc Af Amer: 74 mL/min — ABNORMAL LOW (ref >90)
GFR, EST NON AFRICAN AMERICAN: 64 mL/min — AB (ref >90)
GLUCOSE: 113 mg/dL — AB (ref 70–99)
POTASSIUM: 3.9 meq/L (ref 3.7–5.3)
SODIUM: 140 meq/L (ref 137–147)

## 2014-01-31 MED ORDER — BIOTENE DRY MOUTH MT LIQD
15.0000 mL | Freq: Two times a day (BID) | OROMUCOSAL | Status: DC
  Administered 2014-01-31 – 2014-02-01 (×4): 15 mL via OROMUCOSAL

## 2014-01-31 MED ORDER — CETYLPYRIDINIUM CHLORIDE 0.05 % MT LIQD
7.0000 mL | Freq: Two times a day (BID) | OROMUCOSAL | Status: DC
  Administered 2014-01-31 – 2014-02-01 (×4): 7 mL via OROMUCOSAL

## 2014-01-31 MED ORDER — CHLORHEXIDINE GLUCONATE 0.12 % MT SOLN
15.0000 mL | Freq: Two times a day (BID) | OROMUCOSAL | Status: DC
  Administered 2014-01-31 – 2014-02-01 (×2): 15 mL via OROMUCOSAL
  Filled 2014-01-31 (×5): qty 15

## 2014-01-31 NOTE — Care Management Note (Signed)
    Page 1 of 1   01/31/2014     2:30:33 PM CARE MANAGEMENT NOTE 01/31/2014  Patient:  Martin Edwards, Martin Edwards   Account Number:  [id number]  Date Initiated:  01/31/2014  Documentation initiated by:  Tomi Bamberger  Subjective/Objective Assessment:   dx sbo  admit- lives alone.     Action/Plan:   has ng tube   Anticipated DC Date:  02/02/2014   Anticipated DC Plan:  [place]  CM consult      Choice offered to / List presented to:             Status of service:  In process, will continue to follow Medicare Important Message given?   (If response is "NO", the following Medicare IM given date fields will be blank) Date Medicare IM given:   Medicare IM given by:   Date Additional Medicare IM given:   Additional Medicare IM given by:    Discharge Disposition:    Per UR Regulation:  Reviewed for med. necessity/level of care/duration of stay  If discussed at [place] of Stay Meetings, dates discussed:    Comments:  01/31/14 [place], BSN [id number] patient with sbo, has ng tube, NCM will continue to monitor for dc needs.

## 2014-01-31 NOTE — Progress Notes (Signed)
Chaplain Note: Chaplain responded to patient's request for a spiritual care consultation. Patient was awake and sitting in his chair, with his son at his bedside. Patient mentioned having gone through these procedures some time in the past. He says that even though he knows what to expect, it is still a little difficult and frustrating that his diagnosis is reoccurring. He seems to have a great deal of support through his family, but might benefit from a visit in the future. Please contact spiritual care office as needed.  Sheryn Bison, Chaplain

## 2014-01-31 NOTE — Progress Notes (Signed)
X-rays appear normal today Some flatus, but no BM Continue NG tube until abdomen feels normal - maybe d/c in AM Ice chips.   Imogene Burn. Georgette Dover, MD, [facility]  General/ Trauma Surgery  01/31/2014 12:13 PM

## 2014-01-31 NOTE — Progress Notes (Signed)
Utilization review completed.  

## 2014-01-31 NOTE — ED Provider Notes (Signed)
Medical screening examination/treatment/procedure(s) were conducted as a shared visit with non-physician practitioner(s) and myself.  I personally evaluated the patient during the encounter.   EKG Interpretation None     Patient with abdominal pain. CT scan showed small bowel obstruction. Will admit to hospital  [facility] R. Alvino Chapel, MD 01/31/14 1010

## 2014-01-31 NOTE — Progress Notes (Signed)
Patient ID: Martin Edwards, male   DOB: 09-03-1945, 68 y.o.   MRN: [id number]     [facility]      [address] [place], [place] [address]    Phone: [phone] FAX: [phone]     Subjective: Passed flatus last night.  No BM.  No n/v.  Pain is significantly improved.  1238ml out of NGT since placement.   Objective:  Vital signs:  Filed Vitals:   01/30/14 1943 01/31/14 0007 01/31/14 0403 01/31/14 0808  BP: 131/71 125/68 135/66 123/61  Pulse: 81 71 72 70  Temp: 99 F (37.2 C) 99.2 F (37.3 C) 98.6 F (37 C) 98.9 F (37.2 C)  TempSrc: Oral Oral Oral Oral  Resp: $Remo'18 18 18 16  'oFiQA$ Height: $Rem'5\' 9"'aWVs$  (1.753 m)     Weight: 169 lb (76.658 kg)     SpO2: 97% 98% 97% 98%    Last BM Date: 01/28/14  Intake/Output   Yesterday:  08/19 0701 - 08/20 0700 In: -  Out: 1700 [Urine:500; Emesis/NG output:1200] This shift:  Total I/O In: 100 [I.V.:100] Out: -   Physical Exam: General: Pt awake/alert/oriented x4 in no acute distress Chest: cta. No chest wall pain w good excursion CV:  Pulses intact.  Regular rhythm MS: Normal AROM mjr joints.  No obvious deformity Abdomen: Soft.  Nondistended.  Non tender.  No evidence of peritonitis.  No incarcerated hernias. Ext:  SCDs BLE.  No mjr edema.  No cyanosis Skin: No petechiae / purpura   Problem List:   Active Problems:   Small bowel obstruction due to adhesions    Results:   Labs: Results for orders placed during the hospital encounter of 01/30/14 (from the past 48 hour(s))  COMPREHENSIVE METABOLIC PANEL     Status: Abnormal   Collection Time    01/30/14 12:53 PM      Result Value Ref Range   Sodium 141  137 - 147 mEq/L   Potassium 4.6  3.7 - 5.3 mEq/L   Chloride 94 (*) 96 - 112 mEq/L   CO2 30  19 - 32 mEq/L   Glucose, Bld 198 (*) 70 - 99 mg/dL   BUN 12  6 - 23 mg/dL   Creatinine, Ser 1.13  0.50 - 1.35 mg/dL   Calcium 11.3 (*) 8.4 - 10.5 mg/dL   Total Protein 8.2  6.0 - 8.3  g/dL   Albumin 4.3  3.5 - 5.2 g/dL   AST 18  0 - 37 U/L   ALT 14  0 - 53 U/L   Alkaline Phosphatase 68  39 - 117 U/L   Total Bilirubin 1.2  0.3 - 1.2 mg/dL   GFR calc non Af Amer 65 (*) >90 mL/min   GFR calc Af Amer 75 (*) >90 mL/min   Comment: (NOTE)     The eGFR has been calculated using the CKD EPI equation.     This calculation has not been validated in all clinical situations.     eGFR's persistently <90 mL/min signify possible Chronic Kidney     Disease.   Anion gap 17 (*) 5 - 15  CBC WITH DIFFERENTIAL     Status: Abnormal   Collection Time    01/30/14  1:32 PM      Result Value Ref Range   WBC 11.4 (*) 4.0 - 10.5 K/uL   RBC 5.06  4.22 - 5.81 MIL/uL   Hemoglobin 16.0  13.0 - 17.0 g/dL  HCT 45.7  39.0 - 52.0 %   MCV 90.3  78.0 - 100.0 fL   MCH 31.6  26.0 - 34.0 pg   MCHC 35.0  30.0 - 36.0 g/dL   RDW 13.2  11.5 - 15.5 %   Platelets 228  150 - 400 K/uL   Neutrophils Relative % 91 (*) 43 - 77 %   Neutro Abs 10.3 (*) 1.7 - 7.7 K/uL   Lymphocytes Relative 5 (*) 12 - 46 %   Lymphs Abs 0.5 (*) 0.7 - 4.0 K/uL   Monocytes Relative 4  3 - 12 %   Monocytes Absolute 0.5  0.1 - 1.0 K/uL   Eosinophils Relative 0  0 - 5 %   Eosinophils Absolute 0.0  0.0 - 0.7 K/uL   Basophils Relative 0  0 - 1 %   Basophils Absolute 0.0  0.0 - 0.1 K/uL  LIPASE, BLOOD     Status: None   Collection Time    01/30/14  1:32 PM      Result Value Ref Range   Lipase 23  11 - 59 U/L  URINALYSIS, ROUTINE W REFLEX MICROSCOPIC     Status: Abnormal   Collection Time    01/30/14  2:50 PM      Result Value Ref Range   Color, Urine AMBER (*) YELLOW   Comment: BIOCHEMICALS MAY BE AFFECTED BY COLOR   APPearance CLOUDY (*) CLEAR   Specific Gravity, Urine 1.027  1.005 - 1.030   pH 6.0  5.0 - 8.0   Glucose, UA 100 (*) NEGATIVE mg/dL   Hgb urine dipstick MODERATE (*) NEGATIVE   Bilirubin Urine NEGATIVE  NEGATIVE   Ketones, ur 15 (*) NEGATIVE mg/dL   Protein, ur 100 (*) NEGATIVE mg/dL   Urobilinogen, UA 0.2   0.0 - 1.0 mg/dL   Nitrite NEGATIVE  NEGATIVE   Leukocytes, UA NEGATIVE  NEGATIVE  URINE MICROSCOPIC-ADD ON     Status: Abnormal   Collection Time    01/30/14  2:50 PM      Result Value Ref Range   WBC, UA 0-2  <3 WBC/hpf   RBC / HPF 7-10  <3 RBC/hpf   Bacteria, UA RARE  RARE   Crystals CA OXALATE CRYSTALS (*) NEGATIVE   Urine-Other MUCOUS PRESENT    BASIC METABOLIC PANEL     Status: Abnormal   Collection Time    01/31/14  5:45 AM      Result Value Ref Range   Sodium 140  137 - 147 mEq/L   Potassium 3.9  3.7 - 5.3 mEq/L   Chloride 99  96 - 112 mEq/L   CO2 30  19 - 32 mEq/L   Glucose, Bld 113 (*) 70 - 99 mg/dL   BUN 11  6 - 23 mg/dL   Creatinine, Ser 1.14  0.50 - 1.35 mg/dL   Calcium 9.3  8.4 - 10.5 mg/dL   GFR calc non Af Amer 64 (*) >90 mL/min   GFR calc Af Amer 74 (*) >90 mL/min   Comment: (NOTE)     The eGFR has been calculated using the CKD EPI equation.     This calculation has not been validated in all clinical situations.     eGFR's persistently <90 mL/min signify possible Chronic Kidney     Disease.   Anion gap 11  5 - 15  CBC     Status: None   Collection Time    01/31/14  5:45 AM  Result Value Ref Range   WBC 8.2  4.0 - 10.5 K/uL   RBC 4.29  4.22 - 5.81 MIL/uL   Hemoglobin 13.4  13.0 - 17.0 g/dL   HCT 39.2  39.0 - 52.0 %   MCV 91.4  78.0 - 100.0 fL   MCH 31.2  26.0 - 34.0 pg   MCHC 34.2  30.0 - 36.0 g/dL   RDW 13.5  11.5 - 15.5 %   Platelets 188  150 - 400 K/uL    Imaging / Studies: Ct Abdomen Pelvis W Contrast  01/30/2014   CLINICAL DATA:  Diffuse abdominal pain radiating to back.  Vomiting.  EXAM: CT ABDOMEN AND PELVIS WITH CONTRAST  TECHNIQUE: Multidetector CT imaging of the abdomen and pelvis was performed using the standard protocol following bolus administration of intravenous contrast.  CONTRAST:  126mL OMNIPAQUE IOHEXOL 300 MG/ML  SOLN  COMPARISON:  01/14/2006  FINDINGS: Linear subsegmental atelectasis in the lung bases. Heart is normal size. No  effusions.  Stomach is distended with gas and fluid. Postoperative changes noted in the right colon. Several loops of small bowel are dilated in the abdomen. The terminal ileum and distal small bowel are decompressed. Exact transition point is not visualized. Findings concerning for partial small bowel obstruction.  Small to moderate free fluid in the pelvis. No free air or adenopathy.  Liver, spleen, pancreas, adrenals and left kidney are unremarkable. 4 mm nonobstructing right renal stone in a midpole calyx. No new hydronephrosis.  Aorta and iliac vessels are heavily calcified, non aneurysmal.  Sigmoid diverticulosis.  No active diverticulitis.  IMPRESSION: Dilated small bowel loops and stomach concerning for small bowel obstruction. Distal small bowel loops are decompressed. Exact transition point not visualized. Small to moderate free fluid in the pelvis.  Postoperative changes in the right colon and at the terminal ileum/ileocecal valve.  Right nephrolithiasis.  No hydronephrosis.   Electronically Signed   By: Rolm Baptise M.D.   On: 01/30/2014 17:13   Dg Abd Acute W/chest  01/30/2014   CLINICAL DATA:  Abdominal pain, vomiting  EXAM: ACUTE ABDOMEN SERIES (ABDOMEN 2 VIEW & CHEST 1 VIEW)  COMPARISON:  Chest x-ray of 10/20/2010  FINDINGS: No active infiltrate or effusion is seen. Mediastinal and hilar contours are unremarkable. The heart is within normal limits in size.  Supine and erect views of the abdomen show dilated loops of small bowel with air-fluid levels consistent with a partial small bowel obstruction. No definite colonic bowel gas is seen. No free air is noted. CT of the abdomen pelvis is recommended to assess for point of obstruction. Surgical clips are noted within the pelvic sidewalls. There are degenerative changes in the lower lumbar spine.  IMPRESSION: 1. Dilated small bowel with air-fluid levels consistent with partial small bowel obstruction. No free air. Recommend CT of the abdomen pelvis  with IV contrast to assess further. 2. No active cardiopulmonary disease. 3. I discussed the findings of this study with Dr. Everlene Farrier at 12:11 p.m. on 01/30/2014   Electronically Signed   By: Ivar Drape M.D.   On: 01/30/2014 12:12    Medications / Allergies:  Scheduled Meds: . antiseptic oral rinse  15 mL Mouth Rinse BID  . antiseptic oral rinse  7 mL Mouth Rinse q12n4p  . chlorhexidine  15 mL Mouth Rinse BID  . enoxaparin (LOVENOX) injection  40 mg Subcutaneous Q24H  . pantoprazole (PROTONIX) IV  40 mg Intravenous QHS   Continuous Infusions: . dextrose 5 % and 0.45 %  NaCl with KCl 20 mEq/L 100 mL/hr at 01/31/14 0659   PRN Meds:.diphenhydrAMINE, diphenhydrAMINE, hydrALAZINE, HYDROmorphone (DILAUDID) injection, metoprolol, ondansetron  Antibiotics: Anti-infectives   None        Assessment/Plan SBO likely 2/2 adhesions  -repeat AXR today, continue with NGT decompression, IVF, pain control and bowel rest.  Hopefully will resolve with non operative management.  -SCD/lovenox -encouraged to ambulate as tolerated -metoprolol/hydralazine PRN for HTN  Erby Pian, ANP-BC [facility] Pager [phone](7A-4:30P) For consults and floor pages call [phone](7A-4:30P)  01/31/2014 10:07 AM

## 2014-02-01 LAB — BASIC METABOLIC PANEL
Anion gap: 11 (ref 5–15)
BUN: 11 mg/dL (ref 6–23)
CALCIUM: 8.8 mg/dL (ref 8.4–10.5)
CO2: 28 meq/L (ref 19–32)
CREATININE: 1.13 mg/dL (ref 0.50–1.35)
Chloride: 100 mEq/L (ref 96–112)
GFR calc Af Amer: 75 mL/min — ABNORMAL LOW (ref >90)
GFR, EST NON AFRICAN AMERICAN: 65 mL/min — AB (ref >90)
GLUCOSE: 117 mg/dL — AB (ref 70–99)
Potassium: 4.2 mEq/L (ref 3.7–5.3)
Sodium: 139 mEq/L (ref 137–147)

## 2014-02-01 LAB — CBC
HEMATOCRIT: 42.6 % (ref 39.0–52.0)
HEMOGLOBIN: 14 g/dL (ref 13.0–17.0)
MCH: 31.2 pg (ref 26.0–34.0)
MCHC: 32.9 g/dL (ref 30.0–36.0)
MCV: 94.9 fL (ref 78.0–100.0)
Platelets: 171 10*3/uL (ref 150–400)
RBC: 4.49 MIL/uL (ref 4.22–5.81)
RDW: 13.3 % (ref 11.5–15.5)
WBC: 5.8 10*3/uL (ref 4.0–10.5)

## 2014-02-01 LAB — GLUCOSE, CAPILLARY: Glucose-Capillary: 136 mg/dL — ABNORMAL HIGH (ref 70–99)

## 2014-02-01 MED ORDER — BISACODYL 10 MG RE SUPP
10.0000 mg | Freq: Once | RECTAL | Status: AC
  Administered 2014-02-01: 10 mg via RECTAL
  Filled 2014-02-01: qty 1

## 2014-02-01 NOTE — Progress Notes (Signed)
Patient ID: Martin Edwards, male   DOB: [birth year], 68 y.o.   MRN: [id number]     [address] [place], [place] [address]    Phone: [phone] FAX: [phone]     Subjective: Passed flatus 2 night ago, no BM yet.  No n/v.  VSS.  Afebrile.  Labs reviewed, stable.   Objective:  Vital signs:  Filed Vitals:   01/31/14 1200 01/31/14 1543 01/31/14 2100 02/01/14 0509  BP: 130/59 119/55 110/56 119/58  Pulse: 70 69 64 63  Temp: 98.6 F (37 C) 98.8 F (37.1 C) 98.5 F (36.9 C) 98.6 F (37 C)  TempSrc: Oral Oral Oral Oral  Resp: _0 Height:      Weight:      SpO2: 98% 98% 95% 99%    Last BM Date: 01/28/14  Intake/Output   Yesterday:  08/20 0701 - 08/21 0700 In: 1706.7 [I.V.:1706.7] Out: 2000 [Urine:600; Emesis/NG output:1400] This shift:    I/O last 3 completed shifts: In: 1706.7 [I.V.:1706.7] Out: 3300 [Urine:1100; Emesis/NG output:2200]    Physical Exam:  General: Pt awake/alert/oriented x4 in no acute distress  Chest: cta. No chest wall pain w good excursion  CV: Pulses intact. Regular rhythm  MS: Normal AROM mjr joints. No obvious deformity  Abdomen: Soft. Nondistended.  No evidence of peritonitis. No incarcerated hernias.  Ext: SCDs BLE. No mjr edema. No cyanosis  Skin: No petechiae / purpura   Problem List:   Active Problems:   Small bowel obstruction due to adhesions    Results:   Labs: Results for orders placed during the hospital encounter of 01/30/14 (from the past 48 hour(s))  COMPREHENSIVE METABOLIC PANEL     Status: Abnormal   Collection Time    01/30/14 12:53 PM      Result Value Ref Range   Sodium 141  137 - 147 mEq/L   Potassium 4.6  3.7 - 5.3 mEq/L   Chloride 94 (*) 96 - 112 mEq/L   CO2 30  19 - 32 mEq/L   Glucose, Bld 198 (*) 70 - 99 mg/dL   BUN 12  6 - 23 mg/dL   Creatinine, Ser 1.13  0.50 - 1.35 mg/dL   Calcium 11.3 (*) 8.4 - 10.5 mg/dL   Total Protein  8.2  6.0 - 8.3 g/dL   Albumin 4.3  3.5 - 5.2 g/dL   AST 18  0 - 37 U/L   ALT 14  0 - 53 U/L   Alkaline Phosphatase 68  39 - 117 U/L   Total Bilirubin 1.2  0.3 - 1.2 mg/dL   GFR calc non Af Amer 65 (*) >90 mL/min   GFR calc Af Amer 75 (*) >90 mL/min   Comment: (NOTE)     The eGFR has been calculated using the CKD EPI equation.     This calculation has not been validated in all clinical situations.     eGFR's persistently <90 mL/min signify possible Chronic Kidney     Disease.   Anion gap 17 (*) 5 - 15  CBC WITH DIFFERENTIAL     Status: Abnormal   Collection Time    01/30/14  1:32 PM      Result Value Ref Range   WBC 11.4 (*) 4.0 - 10.5 K/uL   RBC 5.06  4.22 - 5.81 MIL/uL   Hemoglobin 16.0  13.0 - 17.0 g/dL   HCT 45.7  39.0 - 52.0 %   MCV 90.3  78.0 - 100.0 fL   MCH 31.6  26.0 - 34.0 pg   MCHC 35.0  30.0 - 36.0 g/dL   RDW 13.2  11.5 - 15.5 %   Platelets 228  150 - 400 K/uL   Neutrophils Relative % 91 (*) 43 - 77 %   Neutro Abs 10.3 (*) 1.7 - 7.7 K/uL   Lymphocytes Relative 5 (*) 12 - 46 %   Lymphs Abs 0.5 (*) 0.7 - 4.0 K/uL   Monocytes Relative 4  3 - 12 %   Monocytes Absolute 0.5  0.1 - 1.0 K/uL   Eosinophils Relative 0  0 - 5 %   Eosinophils Absolute 0.0  0.0 - 0.7 K/uL   Basophils Relative 0  0 - 1 %   Basophils Absolute 0.0  0.0 - 0.1 K/uL  LIPASE, BLOOD     Status: None   Collection Time    01/30/14  1:32 PM      Result Value Ref Range   Lipase 23  11 - 59 U/L  URINE CULTURE     Status: None   Collection Time    01/30/14  2:50 PM      Result Value Ref Range   Specimen Description URINE, RANDOM     Special Requests NONE     Culture  Setup Time       Value: 01/30/2014 15:45     Performed at [facility]       Value: NO GROWTH     Performed at [facility]   Culture       Value: NO GROWTH     Performed at [facility]   Report Status 01/31/2014 FINAL    URINALYSIS, ROUTINE W REFLEX MICROSCOPIC     Status: Abnormal    Collection Time    01/30/14  2:50 PM      Result Value Ref Range   Color, Urine AMBER (*) YELLOW   Comment: BIOCHEMICALS MAY BE AFFECTED BY COLOR   APPearance CLOUDY (*) CLEAR   Specific Gravity, Urine 1.027  1.005 - 1.030   pH 6.0  5.0 - 8.0   Glucose, UA 100 (*) NEGATIVE mg/dL   Hgb urine dipstick MODERATE (*) NEGATIVE   Bilirubin Urine NEGATIVE  NEGATIVE   Ketones, ur 15 (*) NEGATIVE mg/dL   Protein, ur 100 (*) NEGATIVE mg/dL   Urobilinogen, UA 0.2  0.0 - 1.0 mg/dL   Nitrite NEGATIVE  NEGATIVE   Leukocytes, UA NEGATIVE  NEGATIVE  URINE MICROSCOPIC-ADD ON     Status: Abnormal   Collection Time    01/30/14  2:50 PM      Result Value Ref Range   WBC, UA 0-2  <3 WBC/hpf   RBC / HPF 7-10  <3 RBC/hpf   Bacteria, UA RARE  RARE   Crystals CA OXALATE CRYSTALS (*) NEGATIVE   Urine-Other MUCOUS PRESENT    BASIC METABOLIC PANEL     Status: Abnormal   Collection Time    01/31/14  5:45 AM      Result Value Ref Range   Sodium 140  137 - 147 mEq/L   Potassium 3.9  3.7 - 5.3 mEq/L   Chloride 99  96 - 112 mEq/L   CO2 30  19 - 32 mEq/L   Glucose, Bld 113 (*) 70 - 99 mg/dL   BUN 11  6 - 23 mg/dL   Creatinine, Ser  1.14  0.50 - 1.35 mg/dL   Calcium 9.3  8.4 - 10.5 mg/dL   GFR calc non Af Amer 64 (*) >90 mL/min   GFR calc Af Amer 74 (*) >90 mL/min   Comment: (NOTE)     The eGFR has been calculated using the CKD EPI equation.     This calculation has not been validated in all clinical situations.     eGFR's persistently <90 mL/min signify possible Chronic Kidney     Disease.   Anion gap 11  5 - 15  CBC     Status: None   Collection Time    01/31/14  5:45 AM      Result Value Ref Range   WBC 8.2  4.0 - 10.5 K/uL   RBC 4.29  4.22 - 5.81 MIL/uL   Hemoglobin 13.4  13.0 - 17.0 g/dL   HCT 39.2  39.0 - 52.0 %   MCV 91.4  78.0 - 100.0 fL   MCH 31.2  26.0 - 34.0 pg   MCHC 34.2  30.0 - 36.0 g/dL   RDW 13.5  11.5 - 15.5 %   Platelets 188  150 - 400 K/uL  CBC     Status: None   Collection  Time    02/01/14  6:49 AM      Result Value Ref Range   WBC 5.8  4.0 - 10.5 K/uL   RBC 4.49  4.22 - 5.81 MIL/uL   Hemoglobin 14.0  13.0 - 17.0 g/dL   HCT 42.6  39.0 - 52.0 %   MCV 94.9  78.0 - 100.0 fL   MCH 31.2  26.0 - 34.0 pg   MCHC 32.9  30.0 - 36.0 g/dL   RDW 13.3  11.5 - 15.5 %   Platelets 171  150 - 400 K/uL  BASIC METABOLIC PANEL     Status: Abnormal   Collection Time    02/01/14  6:49 AM      Result Value Ref Range   Sodium 139  137 - 147 mEq/L   Potassium 4.2  3.7 - 5.3 mEq/L   Chloride 100  96 - 112 mEq/L   CO2 28  19 - 32 mEq/L   Glucose, Bld 117 (*) 70 - 99 mg/dL   BUN 11  6 - 23 mg/dL   Creatinine, Ser 1.13  0.50 - 1.35 mg/dL   Calcium 8.8  8.4 - 10.5 mg/dL   GFR calc non Af Amer 65 (*) >90 mL/min   GFR calc Af Amer 75 (*) >90 mL/min   Comment: (NOTE)     The eGFR has been calculated using the CKD EPI equation.     This calculation has not been validated in all clinical situations.     eGFR's persistently <90 mL/min signify possible Chronic Kidney     Disease.   Anion gap 11  5 - 15    Imaging / Studies: Ct Abdomen Pelvis W Contrast  01/30/2014   CLINICAL DATA:  Diffuse abdominal pain radiating to back.  Vomiting.  EXAM: CT ABDOMEN AND PELVIS WITH CONTRAST  TECHNIQUE: Multidetector CT imaging of the abdomen and pelvis was performed using the standard protocol following bolus administration of intravenous contrast.  CONTRAST:  114m OMNIPAQUE IOHEXOL 300 MG/ML  SOLN  COMPARISON:  01/14/2006  FINDINGS: Linear subsegmental atelectasis in the lung bases. Heart is normal size. No effusions.  Stomach is distended with gas and fluid. Postoperative changes noted in the right colon. Several loops of small  bowel are dilated in the abdomen. The terminal ileum and distal small bowel are decompressed. Exact transition point is not visualized. Findings concerning for partial small bowel obstruction.  Small to moderate free fluid in the pelvis. No free air or adenopathy.  Liver,  spleen, pancreas, adrenals and left kidney are unremarkable. 4 mm nonobstructing right renal stone in a midpole calyx. No new hydronephrosis.  Aorta and iliac vessels are heavily calcified, non aneurysmal.  Sigmoid diverticulosis.  No active diverticulitis.  IMPRESSION: Dilated small bowel loops and stomach concerning for small bowel obstruction. Distal small bowel loops are decompressed. Exact transition point not visualized. Small to moderate free fluid in the pelvis.  Postoperative changes in the right colon and at the terminal ileum/ileocecal valve.  Right nephrolithiasis.  No hydronephrosis.   Electronically Signed   By: Rolm Baptise M.D.   On: 01/30/2014 17:13   Dg Abd 2 Views  01/31/2014   CLINICAL DATA:  Abdominal pain.  Evaluate small bowel obstruction.  EXAM: ABDOMEN - 2 VIEW  COMPARISON:  CT 1 day prior  FINDINGS: Two supine views. Nasogastric tube which terminates at the body of the stomach. No free intraperitoneal air. Resolution of small bowel dilatation. Contrast within normal caliber colon. Distal gas. Surgical clips in the pelvis.  IMPRESSION: Resolution of small bowel obstruction, without acute complication.   Electronically Signed   By: Abigail Miyamoto M.D.   On: 01/31/2014 10:12   Dg Abd Acute W/chest  01/30/2014   CLINICAL DATA:  Abdominal pain, vomiting  EXAM: ACUTE ABDOMEN SERIES (ABDOMEN 2 VIEW & CHEST 1 VIEW)  COMPARISON:  Chest x-ray of 10/20/2010  FINDINGS: No active infiltrate or effusion is seen. Mediastinal and hilar contours are unremarkable. The heart is within normal limits in size.  Supine and erect views of the abdomen show dilated loops of small bowel with air-fluid levels consistent with a partial small bowel obstruction. No definite colonic bowel gas is seen. No free air is noted. CT of the abdomen pelvis is recommended to assess for point of obstruction. Surgical clips are noted within the pelvic sidewalls. There are degenerative changes in the lower lumbar spine.   IMPRESSION: 1. Dilated small bowel with air-fluid levels consistent with partial small bowel obstruction. No free air. Recommend CT of the abdomen pelvis with IV contrast to assess further. 2. No active cardiopulmonary disease. 3. I discussed the findings of this study with Dr. Everlene Farrier at 12:11 p.m. on 01/30/2014   Electronically Signed   By: Ivar Drape M.D.   On: 01/30/2014 12:12    Medications / Allergies:  Scheduled Meds: . antiseptic oral rinse  15 mL Mouth Rinse BID  . antiseptic oral rinse  7 mL Mouth Rinse q12n4p  . chlorhexidine  15 mL Mouth Rinse BID  . enoxaparin (LOVENOX) injection  40 mg Subcutaneous Q24H  . pantoprazole (PROTONIX) IV  40 mg Intravenous QHS   Continuous Infusions: . dextrose 5 % and 0.45 % NaCl with KCl 20 mEq/L 100 mL/hr at 02/01/14 0813   PRN Meds:.diphenhydrAMINE, diphenhydrAMINE, hydrALAZINE, HYDROmorphone (DILAUDID) injection, metoprolol, ondansetron  Antibiotics: Anti-infectives   None     Assessment/Plan SBO likely 2/2 adhesions  -repeat AXR shows resolution, will clamp NGT and allow for clears.  If able to tolerate for 6 hours will DC and give full liquid for dinner. -give dulcolax suppository -SCD/lovenox  -encouraged ambulation -metoprolol/hydralazine PRN for HTN -anticipate discharge tomorrow  Erby Pian, [facility] Pager [phone](7A-4:30P) For consults and floor pages call  [phone](7A-4:30P)  02/01/2014 10:01 AM

## 2014-02-01 NOTE — Progress Notes (Signed)
2 BM today, flatus NG out Tolerating diet  Advance as tolerated. Home tomorrow.  Imogene Burn. Georgette Dover, MD, [facility]  General/ Trauma Surgery  02/01/2014 3:11 PM

## 2014-02-02 NOTE — Discharge Summary (Signed)
Physician Discharge Summary  Patient ID: Martin Edwards MRN: [id number] DOB/AGE: 69-Dec-1947 69 y.o.  Admit date: 01/30/2014 Discharge date: 02/02/2014  Admission Diagnoses: SBO  Discharge Diagnoses: Resolved SBO Active Problems:   Small bowel obstruction due to adhesions   Discharged Condition: good  Hospital Course: PT was admitted with a dx of SBO due to adhesions.  NGT was placed and he was given IVF.  His SBO began to resolve as per KUB and clinically and was slowly transitioned from a CLD to a Reg diet today.  Pt was ambulating well.  He was afebrile and deemed for DC and [place] home.  Consults: None  Significant Diagnostic Studies: radiology: KUB: resolution of SBO  Treatments: IV hydration and NGT  Discharge Exam: Blood pressure 121/69, pulse 60, temperature 98.5 F (36.9 C), temperature source Oral, resp. rate 20, height 5\' 9"  (1.753 m), weight 169 lb (76.658 kg), SpO2 100.00%. General appearance: alert and cooperative Resp: clear to auscultation bilaterally Cardio: regular rate and rhythm, S1, S2 normal, no murmur, click, rub or gallop GI: soft, non-tender; bowel sounds normal; no masses,  no organomegaly  Disposition: 01-Home or Self Care     Medication List         amLODipine 10 MG tablet  Commonly known as:  NORVASC  Take 1 tablet (10 mg total) by mouth daily.     doxycycline 100 MG capsule  Commonly known as:  VIBRAMYCIN  Take 1 capsule (100 mg total) by mouth 2 (two) times daily.     hydrochlorothiazide 12.5 MG capsule  Commonly known as:  MICROZIDE  Take 1 capsule (12.5 mg total) by mouth daily.     lisinopril 40 MG tablet  Commonly known as:  PRINIVIL,ZESTRIL  Take 1 tablet (40 mg total) by mouth daily.     pravastatin 40 MG tablet  Commonly known as:  PRAVACHOL  Take 40 mg by mouth daily.         Signed: Rosario Jacks., Kasiyah Platter 02/02/2014, 7:26 AM

## 2014-02-02 NOTE — Progress Notes (Signed)
  Subjective: Pt doing well this AM.  BM x3 yesterday  TOl FLD  Objective: Vital signs in last 24 hours: Temp:  [98.3 F (36.8 C)-98.5 F (36.9 C)] 98.5 F (36.9 C) (08/22 0556) Pulse Rate:  [60-94] 60 (08/22 0556) Resp:  [18-20] 20 (08/22 0556) BP: (121-157)/(68-79) 121/69 mmHg (08/22 0556) SpO2:  [99 %-100 %] 100 % (08/22 0556) Last BM Date: 02/01/14  Intake/Output from previous day: 08/21 0701 - 08/22 0700 In: 3342 [P.O.:342; I.V.:3000] Out: 800 [Urine:500; Emesis/NG output:300] Intake/Output this shift:    General appearance: alert and cooperative GI: soft, non-tender; bowel sounds normal; no masses,  no organomegaly  Lab Results:   Recent Labs  02/18/2014 0545 02/01/14 0649  WBC 8.2 5.8  HGB 13.4 14.0  HCT 39.2 42.6  PLT 188 171   BMET  Recent Labs  February 18, 2014 0545 02/01/14 0649  NA 140 139  K 3.9 4.2  CL 99 100  CO2 30 28  GLUCOSE 113* 117*  BUN 11 11  CREATININE 1.14 1.13  CALCIUM 9.3 8.8    Studies/Results: Dg Abd 2 Views  February 18, 2014   CLINICAL DATA:  Abdominal pain.  Evaluate small bowel obstruction.  EXAM: ABDOMEN - 2 VIEW  COMPARISON:  CT 1 day prior  FINDINGS: Two supine views. Nasogastric tube which terminates at the body of the stomach. No free intraperitoneal air. Resolution of small bowel dilatation. Contrast within normal caliber colon. Distal gas. Surgical clips in the pelvis.  IMPRESSION: Resolution of small bowel obstruction, without acute complication.   Electronically Signed   By: Abigail Miyamoto M.D.   On: 2014-02-18 10:12    Assessment/Plan: 68 y/o M with resolving SBO -adv diet to reg -if tolerates OK for DC after breakfast -Can f/u prn if Dc'd  LOS: 3 days    Rosario Jacks., [facility] 02/02/2014

## 2014-02-02 NOTE — Progress Notes (Signed)
02/02/14 Patient will be discharged home today, IV site removed, and discharge instructions reviewed with patient.

## 2014-02-02 NOTE — Discharge Instructions (Signed)
Small Bowel Obstruction °A small bowel obstruction means something is blocking the small intestine. The small intestine is the long tube that connects the stomach to the colon. Treatment depends on what is causing the problem. Treatment also depends on how bad the problem is. °HOME CARE °Your doctor may let you go home if your small bowel is not completely blocked. °· Rest. °· Follow your diet as told by your doctor. °· Only drink clear liquids until you start to get better. °· Avoid solid foods as told by your doctor. °· Only take medicine as told by your doctor. °GET HELP RIGHT AWAY IF: °· You have pain or cramps that get worse. °· You throw up (vomit) blood. °· You feel sick to your stomach (nauseous), or you cannot stop throwing up. °· You cannot drink fluids. °· You feel confused. °· You feel dry or thirsty (dehydrated). °· Your belly gets more puffy (bloated). °· You have chills. °· You have a fever. °· You feel weak, or you pass out (faint). °MAKE SURE YOU: °· Understand these instructions. °· Will watch your condition. °· Will get help right away if you are not doing well or get worse. °Document Released: 07/08/2004 Document Revised: 08/23/2011 Document Reviewed: 09/08/2010 °ExitCare® Patient Information ©2015 [employer]. This information is not intended to replace advice given to you by your health care provider. Make sure you discuss any questions you have with your health care provider. ° °

## 2014-02-04 ENCOUNTER — Encounter: Payer: Self-pay | Admitting: Cardiology

## 2014-02-04 ENCOUNTER — Ambulatory Visit (INDEPENDENT_AMBULATORY_CARE_PROVIDER_SITE_OTHER): Payer: Medicare Other | Admitting: Cardiology

## 2014-02-04 VITALS — BP 152/72 | HR 88 | Ht 69.0 in | Wt 168.0 lb

## 2014-02-04 DIAGNOSIS — R9431 Abnormal electrocardiogram [ECG] [EKG]: Secondary | ICD-10-CM

## 2014-02-04 DIAGNOSIS — I10 Essential (primary) hypertension: Secondary | ICD-10-CM

## 2014-02-04 DIAGNOSIS — E785 Hyperlipidemia, unspecified: Secondary | ICD-10-CM

## 2014-02-04 NOTE — Progress Notes (Signed)
[facility]. [address] [place], [place]  [address] Phone: [phone] Fax:  [phone]  Date:  02/04/2014   ID:  Martin Edwards, Martin Edwards [birth year], MRN [id number]  PCP:  Jenny Reichmann, MD   History of Present Illness: Martin Edwards is a 68 y.o. male here for evaluation of chest pain. Washing truck during hot days, felt funny. Possible near syncope. Had an EKG done which showed nonspecific ST-T wave changes. No chest pain. Was sweating. No dyspnea. Felt weak. Downplays symptoms. Has had stress test years ago. Dr. Everlene Farrier requested he be seen, possible stress test  No tob. No Fam hx of early CAD. +HL, +HTN.   He was just in the hospital for small bowel obstruction, nasogastric tube, conservatively managed. He is feeling much better. Adhesions.   Wt Readings from Last 3 Encounters:  02/04/14 168 lb (76.204 kg)  01/30/14 169 lb (76.658 kg)  01/30/14 166 lb (75.297 kg)     Past Medical History  Diagnosis Date  . Hypertension   . Hyperlipidemia   . Kidney stones     "passed them"  . Prostate cancer 1999  . Basal cell carcinoma of face X 2  . Small bowel obstruction due to adhesions 01/30/2014    Past Surgical History  Procedure Laterality Date  . Prostatectomy  05/20/1998  . Colectomy  10/2010    "took out 8 inches of colon; wasn't cancer"  . Basal cell carcinoma excision  X 2    "off my face both times"    Current Outpatient Prescriptions  Medication Sig Dispense Refill  . amLODipine (NORVASC) 10 MG tablet Take 1 tablet (10 mg total) by mouth daily.  90 tablet  3  . hydrochlorothiazide (MICROZIDE) 12.5 MG capsule Take 1 capsule (12.5 mg total) by mouth daily.  90 capsule  3  . lisinopril (PRINIVIL,ZESTRIL) 40 MG tablet Take 1 tablet (40 mg total) by mouth daily.  90 tablet  3  . pravastatin (PRAVACHOL) 40 MG tablet Take 40 mg by mouth daily.       No current facility-administered medications for this visit.    Allergies:   No Known Allergies  Social History:   The patient  reports that he has never smoked. He has never used smokeless tobacco. He reports that he drinks alcohol. He reports that he does not use illicit drugs.   Family History  Problem Relation Age of Onset  . Cancer Father     Lung    ROS:  Please see the history of present illness.   Denies any fevers, chills, orthopnea, PND, chest pain, shortness of breath. No strokelike symptoms.   All other systems reviewed and negative.   PHYSICAL EXAM: VS:  BP 152/72  Pulse 88  Ht 5\' 9"  (1.753 m)  Wt 168 lb (76.204 kg)  BMI 24.80 kg/m2 Well nourished, well developed, in no acute distress HEENT: normal, [place]/AT, EOMI Neck: no JVD, normal carotid upstroke, no bruit Cardiac:  normal S1, S2; RRR; no murmur Lungs:  clear to auscultation bilaterally, no wheezing, rhonchi or rales Abd: soft, nontender, no hepatomegaly, no bruits Ext: no edema, 2+ distal pulses Skin: warm and dry GU: deferred Neuro: no focal abnormalities noted, AAO x 3  EKG:  12/19/13 - NSR, NSSTW changes.    Labs: Creatinine 1.1, hemoglobin 14.0, LDL 123  ASSESSMENT AND PLAN:  1. Abnormal EKG-nonspecific ST-T wave changes seen on EKG. Dr. Everlene Farrier was concerned and sent him for further  evaluation. He had atypical symptoms, felt poor during washing of his truck. Slight dizziness. Overheated. He has not had any significant symptoms since then. He plays tennis quite a bit. He did relate to me that he hoped that he could get his stress test today and not have to pay a second co-pay for a scheduled stress test. He was under the impression that he was getting a stress test today. My recommendation is that he go forward with stress testing, ETT. 2. Hypertension-mildly elevated today. Normally, under good control. In fact, when he was in hospital, his blood pressure was running low without any medications. 3. Hyperlipidemia-pravastatin. Continue. LDL 123. 4. Will follow up with stress test.   Signed, Candee Furbish, MD [facility]  02/04/2014  9:01 AM

## 2014-02-04 NOTE — Patient Instructions (Signed)
The current medical regimen is effective;  continue present plan and medications.  Your physician has requested that you have an exercise tolerance test. For further information please visit [email or web address]. Please also follow instruction sheet, as given.   

## 2014-02-25 ENCOUNTER — Encounter: Payer: Self-pay | Admitting: Emergency Medicine

## 2014-02-28 ENCOUNTER — Telehealth: Payer: Self-pay | Admitting: Cardiology

## 2014-02-28 NOTE — Telephone Encounter (Signed)
Was schedule for GXt on 03-07-14 per patient cancel.  Patient states dr said this test is unnecessary.

## 2014-03-01 NOTE — Telephone Encounter (Signed)
Noted  

## 2014-03-07 ENCOUNTER — Encounter: Payer: Medicare Other | Admitting: Physician Assistant

## 2014-05-24 ENCOUNTER — Telehealth: Payer: Self-pay

## 2014-05-24 NOTE — Telephone Encounter (Signed)
Called patient to remind him to get his flu shot.  Patient states that he does not take flu shots.

## 2014-06-05 ENCOUNTER — Other Ambulatory Visit: Payer: Self-pay

## 2014-06-05 MED ORDER — LISINOPRIL 40 MG PO TABS: 40.0000 mg | ORAL_TABLET | Freq: Every day | ORAL | Status: DC

## 2014-06-05 MED ORDER — AMLODIPINE BESYLATE 10 MG PO TABS: 10.0000 mg | ORAL_TABLET | Freq: Every day | ORAL | Status: DC

## 2014-06-05 MED ORDER — PRAVASTATIN SODIUM 40 MG PO TABS: 40.0000 mg | ORAL_TABLET | Freq: Every day | ORAL | Status: DC

## 2014-06-18 ENCOUNTER — Encounter: Payer: Self-pay | Admitting: Emergency Medicine

## 2014-06-18 ENCOUNTER — Ambulatory Visit (INDEPENDENT_AMBULATORY_CARE_PROVIDER_SITE_OTHER): Payer: Medicare Other | Admitting: Emergency Medicine

## 2014-06-18 VITALS — BP 162/74 | HR 91 | Temp 98.2°F | Resp 16 | Ht 68.5 in | Wt 172.0 lb

## 2014-06-18 DIAGNOSIS — Z23 Encounter for immunization: Secondary | ICD-10-CM

## 2014-06-18 DIAGNOSIS — E785 Hyperlipidemia, unspecified: Secondary | ICD-10-CM | POA: Diagnosis not present

## 2014-06-18 DIAGNOSIS — Z1211 Encounter for screening for malignant neoplasm of colon: Secondary | ICD-10-CM | POA: Diagnosis not present

## 2014-06-18 DIAGNOSIS — I10 Essential (primary) hypertension: Secondary | ICD-10-CM | POA: Diagnosis not present

## 2014-06-18 DIAGNOSIS — C61 Malignant neoplasm of prostate: Secondary | ICD-10-CM | POA: Diagnosis not present

## 2014-06-18 LAB — CBC WITH DIFFERENTIAL/PLATELET
BASOS ABS: 0 10*3/uL (ref 0.0–0.1)
BASOS PCT: 0 % (ref 0–1)
Eosinophils Absolute: 0 10*3/uL (ref 0.0–0.7)
Eosinophils Relative: 0 % (ref 0–5)
HEMATOCRIT: 46.1 % (ref 39.0–52.0)
Hemoglobin: 16 g/dL (ref 13.0–17.0)
LYMPHS PCT: 26 % (ref 12–46)
Lymphs Abs: 1.4 10*3/uL (ref 0.7–4.0)
MCH: 31.5 pg (ref 26.0–34.0)
MCHC: 34.7 g/dL (ref 30.0–36.0)
MCV: 90.7 fL (ref 78.0–100.0)
MPV: 12.2 fL (ref 8.6–12.4)
Monocytes Absolute: 0.4 10*3/uL (ref 0.1–1.0)
Monocytes Relative: 7 % (ref 3–12)
NEUTROS PCT: 67 % (ref 43–77)
Neutro Abs: 3.7 10*3/uL (ref 1.7–7.7)
Platelets: 212 10*3/uL (ref 150–400)
RBC: 5.08 MIL/uL (ref 4.22–5.81)
RDW: 13.7 % (ref 11.5–15.5)
WBC: 5.5 10*3/uL (ref 4.0–10.5)

## 2014-06-18 LAB — BASIC METABOLIC PANEL
BUN: 13 mg/dL (ref 6–23)
CALCIUM: 9.9 mg/dL (ref 8.4–10.5)
CHLORIDE: 98 meq/L (ref 96–112)
CO2: 30 meq/L (ref 19–32)
CREATININE: 1.03 mg/dL (ref 0.50–1.35)
Glucose, Bld: 114 mg/dL — ABNORMAL HIGH (ref 70–99)
POTASSIUM: 4.9 meq/L (ref 3.5–5.3)
SODIUM: 137 meq/L (ref 135–145)

## 2014-06-18 LAB — LIPID PANEL
CHOL/HDL RATIO: 4.3 ratio
CHOLESTEROL: 224 mg/dL — AB (ref 0–200)
HDL: 52 mg/dL (ref >39)
LDL Cholesterol: 142 mg/dL — ABNORMAL HIGH (ref 0–99)
TRIGLYCERIDES: 150 mg/dL — AB (ref <150)
VLDL: 30 mg/dL (ref 0–40)

## 2014-06-18 NOTE — Progress Notes (Addendum)
Subjective:  This chart was scribed for Nena Jordan, MD by Dellis Filbert, ED Scribe at [facility].The patient was seen in exam room 21 and the patient's care was started at 8:20 AM.   Patient ID: Martin Edwards, male    DOB: 05/01/1946, 69 y.o.   MRN: [id number] Chief Complaint  Patient presents with  . Follow-up  . Hypertension   HPI HPI Comments: Martin Edwards is a 69 y.o. male with a history of hypertension, hyperlipidemia, small bowel obstruction, prostate cancer who presents to [facility] for a follow-up. Pt is much improved from last visit, last seen here for small bowel obstruction. He was in the hospital for 4 days, but surgery was not needed. He was admitted in August. Pt states having hyperactive bowel sounds this morning. BP was 148/70 in the exam room. Lost a sister this summer due to pulmonary fibrosis and brother in law in October. He denies CP. No abdominal distension or abdominal pain since his surgery.  Pt has prostate cancer and has not followed up with his prostate doctor. Pt also has cortaid stenosis and has not followed up with his cardiologist.   Patient Active Problem List   Diagnosis Date Noted  . Small bowel obstruction due to adhesions 01/30/2014  . Sebaceous cyst x2 01/04/2014  . Prostate cancer 12/12/2012  . Hypertension 10/21/2011  . Hyperlipidemia 10/21/2011  . Adenomatous colon polyp 10/21/2011  . Postop check 12/29/2010   Past Medical History  Diagnosis Date  . Hypertension   . Hyperlipidemia   . Kidney stones     "passed them"  . Prostate cancer 1999  . Basal cell carcinoma of face X 2  . Small bowel obstruction due to adhesions 01/30/2014   Past Surgical History  Procedure Laterality Date  . Prostatectomy  05/20/1998  . Colectomy  10/2010    "took out 8 inches of colon; wasn't cancer"  . Basal cell carcinoma excision  X 2    "off my face both times"   No Known Allergies Prior to Admission medications   Medication Sig Start  Date End Date Taking? Authorizing Provider  amLODipine (NORVASC) 10 MG tablet Take 1 tablet (10 mg total) by mouth daily. 06/05/14  Yes Darlyne Russian, MD  hydrochlorothiazide (MICROZIDE) 12.5 MG capsule Take 1 capsule (12.5 mg total) by mouth daily. 12/18/13  Yes Darlyne Russian, MD  lisinopril (PRINIVIL,ZESTRIL) 40 MG tablet Take 1 tablet (40 mg total) by mouth daily. 06/05/14  Yes Darlyne Russian, MD  pravastatin (PRAVACHOL) 40 MG tablet Take 1 tablet (40 mg total) by mouth daily. 06/05/14  Yes Darlyne Russian, MD   History   Social History  . Marital Status: Divorced    Spouse Name: N/A    Number of Children: N/A  . Years of Education: N/A   Occupational History  . Not on file.   Social History Main Topics  . Smoking status: Never Smoker   . Smokeless tobacco: Never Used  . Alcohol Use: Yes     Comment: 01/30/2014 "maybe a beer twice/yr"  . Drug Use: No  . Sexual Activity: Not Currently   Other Topics Concern  . Not on file   Social History Narrative   Exercise: 2-3 times a wk   Review of Systems  Cardiovascular: Negative for chest pain.  Gastrointestinal: Negative for abdominal pain and abdominal distention.      Objective:  BP 162/74 mmHg  Pulse 91  Temp(Src) 98.2  F (36.8 C)  Resp 16  Ht 5' 8.5" (1.74 m)  Wt 172 lb (78.019 kg)  BMI 25.77 kg/m2  SpO2 99%  Physical Exam  Constitutional: He is oriented to person, place, and time. He appears well-developed and well-nourished.  HENT:  Head: Normocephalic and atraumatic.  Eyes: EOM are normal.  Neck: Normal range of motion.  Cardiovascular: Normal rate.   Faint Cortaid bruit    Pulmonary/Chest: Effort normal.  Musculoskeletal: Normal range of motion.  Neurological: He is alert and oriented to person, place, and time.  Skin: Skin is warm and dry.  Psychiatric: He has a normal mood and affect. His behavior is normal.  Nursing note and vitals reviewed.         Assessment & Plan:  Pt has not followed up to have  his colonoscopy. We have followed his PSA levels here and it has slowly increased over he past 2 years. He does have a history of prosate cancer. He was referred to cardiology due to an abnormality in his EKG. Pt was scheduled to have a stress test but did not follow up because he did not want to pay another co pay. He has a history of  stenosis. He has been complaint with his medication. Pt states he has been very busy due to ongoing health issues with family members. He did agree to take the Prevnar vaccine. He declined a flu shot. Next visit will be for a physical. At the present time he is not agreeable to have repeat cardiology evaluation, a stress test, or repeat carotid studies.I personally performed the services described in this documentation, which was scribed in my presence. The recorded information has been reviewed and is accurate.

## 2014-06-19 ENCOUNTER — Other Ambulatory Visit: Payer: Self-pay | Admitting: Family Medicine

## 2014-06-19 DIAGNOSIS — E785 Hyperlipidemia, unspecified: Secondary | ICD-10-CM

## 2014-06-19 LAB — PSA, MEDICARE: PSA: 0.24 ng/mL (ref <=4.00)

## 2014-06-19 MED ORDER — ATORVASTATIN CALCIUM 40 MG PO TABS: 40.0000 mg | ORAL_TABLET | Freq: Every day | ORAL | Status: DC

## 2014-06-27 DIAGNOSIS — Z86010 Personal history of colonic polyps: Secondary | ICD-10-CM | POA: Diagnosis not present

## 2014-06-27 DIAGNOSIS — K5669 Other intestinal obstruction: Secondary | ICD-10-CM | POA: Diagnosis not present

## 2014-09-24 ENCOUNTER — Other Ambulatory Visit: Payer: Self-pay | Admitting: Gastroenterology

## 2014-09-24 DIAGNOSIS — K621 Rectal polyp: Secondary | ICD-10-CM | POA: Diagnosis not present

## 2014-09-24 DIAGNOSIS — D123 Benign neoplasm of transverse colon: Secondary | ICD-10-CM | POA: Diagnosis not present

## 2014-09-25 DIAGNOSIS — K621 Rectal polyp: Secondary | ICD-10-CM | POA: Diagnosis not present

## 2014-09-25 DIAGNOSIS — Z09 Encounter for follow-up examination after completed treatment for conditions other than malignant neoplasm: Secondary | ICD-10-CM | POA: Diagnosis not present

## 2014-09-25 DIAGNOSIS — K633 Ulcer of intestine: Secondary | ICD-10-CM | POA: Diagnosis not present

## 2014-09-25 DIAGNOSIS — K640 First degree hemorrhoids: Secondary | ICD-10-CM | POA: Diagnosis not present

## 2014-09-25 DIAGNOSIS — Z86010 Personal history of colonic polyps: Secondary | ICD-10-CM | POA: Diagnosis not present

## 2014-09-25 DIAGNOSIS — D126 Benign neoplasm of colon, unspecified: Secondary | ICD-10-CM | POA: Diagnosis not present

## 2014-11-11 ENCOUNTER — Encounter (HOSPITAL_COMMUNITY)
Admission: EM | Disposition: A | Discharge status: Rehabilitation Facility | Payer: Self-pay | Source: Home / Self Care | Attending: Neurosurgery

## 2014-11-11 ENCOUNTER — Emergency Department (HOSPITAL_COMMUNITY): Payer: Medicare Other | Admitting: Certified Registered"

## 2014-11-11 ENCOUNTER — Other Ambulatory Visit (HOSPITAL_COMMUNITY): Payer: Self-pay

## 2014-11-11 ENCOUNTER — Emergency Department (HOSPITAL_COMMUNITY): Payer: Medicare Other

## 2014-11-11 ENCOUNTER — Inpatient Hospital Stay (HOSPITAL_COMMUNITY)
Admission: EM | Admit: 2014-11-11 | Discharge: 2014-11-18 | DRG: 026 | Disposition: A | Discharge status: Rehabilitation Facility | Payer: Medicare Other | Attending: Neurosurgery | Admitting: Neurosurgery

## 2014-11-11 ENCOUNTER — Encounter (HOSPITAL_COMMUNITY): Payer: Self-pay | Admitting: Emergency Medicine

## 2014-11-11 DIAGNOSIS — G40901 Epilepsy, unspecified, not intractable, with status epilepticus: Secondary | ICD-10-CM | POA: Diagnosis not present

## 2014-11-11 DIAGNOSIS — Z8546 Personal history of malignant neoplasm of prostate: Secondary | ICD-10-CM

## 2014-11-11 DIAGNOSIS — R7989 Other specified abnormal findings of blood chemistry: Secondary | ICD-10-CM | POA: Diagnosis not present

## 2014-11-11 DIAGNOSIS — R4182 Altered mental status, unspecified: Secondary | ICD-10-CM

## 2014-11-11 DIAGNOSIS — Z85828 Personal history of other malignant neoplasm of skin: Secondary | ICD-10-CM | POA: Diagnosis not present

## 2014-11-11 DIAGNOSIS — S065X9A Traumatic subdural hemorrhage with loss of consciousness of unspecified duration, initial encounter: Secondary | ICD-10-CM | POA: Diagnosis present

## 2014-11-11 DIAGNOSIS — E876 Hypokalemia: Secondary | ICD-10-CM | POA: Diagnosis not present

## 2014-11-11 DIAGNOSIS — G3184 Mild cognitive impairment, so stated: Secondary | ICD-10-CM | POA: Diagnosis not present

## 2014-11-11 DIAGNOSIS — K5900 Constipation, unspecified: Secondary | ICD-10-CM | POA: Diagnosis not present

## 2014-11-11 DIAGNOSIS — S069X3S Unspecified intracranial injury with loss of consciousness of 1 hour to 5 hours 59 minutes, sequela: Secondary | ICD-10-CM | POA: Diagnosis not present

## 2014-11-11 DIAGNOSIS — R112 Nausea with vomiting, unspecified: Secondary | ICD-10-CM | POA: Diagnosis not present

## 2014-11-11 DIAGNOSIS — Z789 Other specified health status: Secondary | ICD-10-CM | POA: Diagnosis not present

## 2014-11-11 DIAGNOSIS — I10 Essential (primary) hypertension: Secondary | ICD-10-CM | POA: Diagnosis not present

## 2014-11-11 DIAGNOSIS — R51 Headache: Secondary | ICD-10-CM | POA: Diagnosis not present

## 2014-11-11 DIAGNOSIS — R269 Unspecified abnormalities of gait and mobility: Secondary | ICD-10-CM | POA: Diagnosis not present

## 2014-11-11 DIAGNOSIS — I6200 Nontraumatic subdural hemorrhage, unspecified: Secondary | ICD-10-CM | POA: Diagnosis not present

## 2014-11-11 DIAGNOSIS — N390 Urinary tract infection, site not specified: Secondary | ICD-10-CM | POA: Diagnosis not present

## 2014-11-11 DIAGNOSIS — R42 Dizziness and giddiness: Secondary | ICD-10-CM | POA: Diagnosis not present

## 2014-11-11 DIAGNOSIS — Z79899 Other long term (current) drug therapy: Secondary | ICD-10-CM | POA: Diagnosis not present

## 2014-11-11 DIAGNOSIS — S065X0A Traumatic subdural hemorrhage without loss of consciousness, initial encounter: Secondary | ICD-10-CM | POA: Diagnosis not present

## 2014-11-11 DIAGNOSIS — M6281 Muscle weakness (generalized): Secondary | ICD-10-CM | POA: Diagnosis not present

## 2014-11-11 DIAGNOSIS — S065XAA Traumatic subdural hemorrhage with loss of consciousness status unknown, initial encounter: Secondary | ICD-10-CM | POA: Diagnosis present

## 2014-11-11 DIAGNOSIS — Y93E1 Activity, personal bathing and showering: Secondary | ICD-10-CM | POA: Diagnosis not present

## 2014-11-11 DIAGNOSIS — E785 Hyperlipidemia, unspecified: Secondary | ICD-10-CM | POA: Diagnosis present

## 2014-11-11 DIAGNOSIS — S065X4D Traumatic subdural hemorrhage with loss of consciousness of 6 hours to 24 hours, subsequent encounter: Secondary | ICD-10-CM | POA: Diagnosis not present

## 2014-11-11 DIAGNOSIS — W19XXXA Unspecified fall, initial encounter: Secondary | ICD-10-CM | POA: Diagnosis present

## 2014-11-11 DIAGNOSIS — E871 Hypo-osmolality and hyponatremia: Secondary | ICD-10-CM | POA: Diagnosis not present

## 2014-11-11 DIAGNOSIS — I6201 Nontraumatic acute subdural hemorrhage: Secondary | ICD-10-CM | POA: Diagnosis not present

## 2014-11-11 DIAGNOSIS — B958 Unspecified staphylococcus as the cause of diseases classified elsewhere: Secondary | ICD-10-CM | POA: Diagnosis not present

## 2014-11-11 DIAGNOSIS — R509 Fever, unspecified: Secondary | ICD-10-CM

## 2014-11-11 DIAGNOSIS — R569 Unspecified convulsions: Secondary | ICD-10-CM | POA: Diagnosis not present

## 2014-11-11 DIAGNOSIS — G9751 Postprocedural hemorrhage and hematoma of a nervous system organ or structure following a nervous system procedure: Secondary | ICD-10-CM | POA: Diagnosis not present

## 2014-11-11 HISTORY — PX: CRANIOTOMY: SHX93

## 2014-11-11 LAB — DIFFERENTIAL
Basophils Absolute: 0 10*3/uL (ref 0.0–0.1)
Basophils Relative: 0 % (ref 0–1)
EOS ABS: 0 10*3/uL (ref 0.0–0.7)
Eosinophils Relative: 0 % (ref 0–5)
Lymphocytes Relative: 11 % — ABNORMAL LOW (ref 12–46)
Lymphs Abs: 1 10*3/uL (ref 0.7–4.0)
MONO ABS: 0.4 10*3/uL (ref 0.1–1.0)
Monocytes Relative: 5 % (ref 3–12)
NEUTROS ABS: 7.4 10*3/uL (ref 1.7–7.7)
NEUTROS PCT: 84 % — AB (ref 43–77)

## 2014-11-11 LAB — COMPREHENSIVE METABOLIC PANEL
ALT: 21 U/L (ref 17–63)
AST: 22 U/L (ref 15–41)
Albumin: 3.8 g/dL (ref 3.5–5.0)
Alkaline Phosphatase: 58 U/L (ref 38–126)
Anion gap: 9 (ref 5–15)
BUN: 14 mg/dL (ref 6–20)
CO2: 23 mmol/L (ref 22–32)
Calcium: 8.6 mg/dL — ABNORMAL LOW (ref 8.9–10.3)
Chloride: 104 mmol/L (ref 101–111)
Creatinine, Ser: 0.99 mg/dL (ref 0.61–1.24)
GFR calc Af Amer: >60 mL/min (ref >60)
GFR calc non Af Amer: >60 mL/min (ref >60)
Glucose, Bld: 154 mg/dL — ABNORMAL HIGH (ref 65–99)
Potassium: 4 mmol/L (ref 3.5–5.1)
Sodium: 136 mmol/L (ref 135–145)
Total Bilirubin: 0.8 mg/dL (ref 0.3–1.2)
Total Protein: 7.3 g/dL (ref 6.5–8.1)

## 2014-11-11 LAB — TYPE AND SCREEN
ABO/RH(D): O POS
Antibody Screen: NEGATIVE

## 2014-11-11 LAB — CBC
HCT: 41.6 % (ref 39.0–52.0)
Hemoglobin: 14.1 g/dL (ref 13.0–17.0)
MCH: 31.1 pg (ref 26.0–34.0)
MCHC: 33.9 g/dL (ref 30.0–36.0)
MCV: 91.8 fL (ref 78.0–100.0)
Platelets: 172 10*3/uL (ref 150–400)
RBC: 4.53 MIL/uL (ref 4.22–5.81)
RDW: 13.5 % (ref 11.5–15.5)
WBC: 8.8 10*3/uL (ref 4.0–10.5)

## 2014-11-11 LAB — I-STAT TROPONIN, ED: Troponin i, poc: 0 ng/mL (ref 0.00–0.08)

## 2014-11-11 LAB — MRSA PCR SCREENING: MRSA by PCR: NEGATIVE

## 2014-11-11 LAB — PROTIME-INR
INR: 0.95 (ref 0.00–1.49)
Prothrombin Time: 12.9 seconds (ref 11.6–15.2)

## 2014-11-11 LAB — APTT: APTT: 27 s (ref 24–37)

## 2014-11-11 SURGERY — CRANIOTOMY HEMATOMA EVACUATION SUBDURAL
Anesthesia: General | Site: Head | Laterality: Right

## 2014-11-11 MED ORDER — ONDANSETRON HCL 4 MG/2ML IJ SOLN
INTRAMUSCULAR | Status: AC
  Filled 2014-11-11: qty 2

## 2014-11-11 MED ORDER — CEFAZOLIN SODIUM 1-5 GM-% IV SOLN
1.0000 g | Freq: Three times a day (TID) | INTRAVENOUS | Status: AC
  Administered 2014-11-11 (×2): 1 g via INTRAVENOUS
  Filled 2014-11-11 (×2): qty 50

## 2014-11-11 MED ORDER — FENTANYL CITRATE (PF) 250 MCG/5ML IJ SOLN
INTRAMUSCULAR | Status: AC
  Filled 2014-11-11: qty 5

## 2014-11-11 MED ORDER — LIDOCAINE HCL (CARDIAC) 20 MG/ML IV SOLN
INTRAVENOUS | Status: DC | PRN
  Administered 2014-11-11: 60 mg via INTRAVENOUS

## 2014-11-11 MED ORDER — MORPHINE SULFATE 2 MG/ML IJ SOLN: 1.0000 mg | INTRAMUSCULAR | Status: DC | PRN

## 2014-11-11 MED ORDER — THROMBIN 20000 UNITS EX SOLR
CUTANEOUS | Status: DC | PRN
  Administered 2014-11-11: 20 mL via TOPICAL

## 2014-11-11 MED ORDER — PROPOFOL 10 MG/ML IV BOLUS
INTRAVENOUS | Status: DC | PRN
  Administered 2014-11-11: 110 mg via INTRAVENOUS

## 2014-11-11 MED ORDER — PROMETHAZINE HCL 25 MG PO TABS: 12.5000 mg | ORAL_TABLET | ORAL | Status: DC | PRN

## 2014-11-11 MED ORDER — PANTOPRAZOLE SODIUM 40 MG IV SOLR
40.0000 mg | Freq: Every day | INTRAVENOUS | Status: DC
  Administered 2014-11-11: 40 mg via INTRAVENOUS
  Filled 2014-11-11 (×2): qty 40

## 2014-11-11 MED ORDER — SODIUM CHLORIDE 0.9 % IV SOLN
INTRAVENOUS | Status: DC | PRN
  Administered 2014-11-11: 06:00:00 via INTRAVENOUS

## 2014-11-11 MED ORDER — ONDANSETRON HCL 4 MG PO TABS: 4.0000 mg | ORAL_TABLET | ORAL | Status: DC | PRN

## 2014-11-11 MED ORDER — LIDOCAINE-EPINEPHRINE 1 %-1:100000 IJ SOLN
INTRAMUSCULAR | Status: DC | PRN
Start: 2014-11-11 — End: 2014-11-11
  Administered 2014-11-11: 10 mL via INTRADERMAL

## 2014-11-11 MED ORDER — SODIUM CHLORIDE 0.9 % IV SOLN: Freq: Once | INTRAVENOUS | Status: DC

## 2014-11-11 MED ORDER — FENTANYL CITRATE (PF) 100 MCG/2ML IJ SOLN
INTRAMUSCULAR | Status: DC | PRN
  Administered 2014-11-11: 50 ug via INTRAVENOUS
  Administered 2014-11-11: 100 ug via INTRAVENOUS

## 2014-11-11 MED ORDER — ROCURONIUM BROMIDE 100 MG/10ML IV SOLN
INTRAVENOUS | Status: DC | PRN
  Administered 2014-11-11: 50 mg via INTRAVENOUS

## 2014-11-11 MED ORDER — LABETALOL HCL 5 MG/ML IV SOLN
10.0000 mg | INTRAVENOUS | Status: DC | PRN
Start: 2014-11-11 — End: 2014-11-13

## 2014-11-11 MED ORDER — LISINOPRIL 40 MG PO TABS
40.0000 mg | ORAL_TABLET | Freq: Every day | ORAL | Status: DC
  Administered 2014-11-11 – 2014-11-18 (×6): 40 mg via ORAL
  Filled 2014-11-11 (×8): qty 1

## 2014-11-11 MED ORDER — PRAVASTATIN SODIUM 40 MG PO TABS: 40.0000 mg | ORAL_TABLET | Freq: Every day | ORAL | Status: DC

## 2014-11-11 MED ORDER — GLYCOPYRROLATE 0.2 MG/ML IJ SOLN
INTRAMUSCULAR | Status: AC
  Filled 2014-11-11: qty 3

## 2014-11-11 MED ORDER — SODIUM CHLORIDE 0.9 % IV SOLN
500.0000 mg | Freq: Two times a day (BID) | INTRAVENOUS | Status: DC
  Administered 2014-11-11 – 2014-11-13 (×5): 500 mg via INTRAVENOUS
  Filled 2014-11-11 (×6): qty 5

## 2014-11-11 MED ORDER — HYDROMORPHONE HCL 1 MG/ML IJ SOLN: 0.2500 mg | INTRAMUSCULAR | Status: DC | PRN

## 2014-11-11 MED ORDER — NEOSTIGMINE METHYLSULFATE 10 MG/10ML IV SOLN
INTRAVENOUS | Status: AC
  Filled 2014-11-11: qty 1

## 2014-11-11 MED ORDER — SODIUM CHLORIDE 0.9 % IV SOLN
INTRAVENOUS | Status: DC
  Administered 2014-11-11 – 2014-11-13 (×4): via INTRAVENOUS

## 2014-11-11 MED ORDER — 0.9 % SODIUM CHLORIDE (POUR BTL) OPTIME
TOPICAL | Status: DC | PRN
  Administered 2014-11-11 (×2): 1000 mL

## 2014-11-11 MED ORDER — THROMBIN 5000 UNITS EX SOLR
OROMUCOSAL | Status: DC | PRN
  Administered 2014-11-11: 5 mL via TOPICAL

## 2014-11-11 MED ORDER — GLYCOPYRROLATE 0.2 MG/ML IJ SOLN
INTRAMUSCULAR | Status: DC | PRN
  Administered 2014-11-11: 0.4 mg via INTRAVENOUS

## 2014-11-11 MED ORDER — ATORVASTATIN CALCIUM 40 MG PO TABS
40.0000 mg | ORAL_TABLET | Freq: Every day | ORAL | Status: DC
  Administered 2014-11-11 – 2014-11-17 (×4): 40 mg via ORAL
  Filled 2014-11-11 (×8): qty 1

## 2014-11-11 MED ORDER — CEFAZOLIN SODIUM-DEXTROSE 2-3 GM-% IV SOLR
INTRAVENOUS | Status: DC | PRN
  Administered 2014-11-11: 2 g via INTRAVENOUS

## 2014-11-11 MED ORDER — SUCCINYLCHOLINE CHLORIDE 20 MG/ML IJ SOLN
INTRAMUSCULAR | Status: DC | PRN
  Administered 2014-11-11: 100 mg via INTRAVENOUS

## 2014-11-11 MED ORDER — BACITRACIN ZINC 500 UNIT/GM EX OINT
TOPICAL_OINTMENT | CUTANEOUS | Status: DC | PRN
  Administered 2014-11-11: 1 via TOPICAL

## 2014-11-11 MED ORDER — NEOSTIGMINE METHYLSULFATE 10 MG/10ML IV SOLN
INTRAVENOUS | Status: DC | PRN
  Administered 2014-11-11: 3 mg via INTRAVENOUS

## 2014-11-11 MED ORDER — HYDROCHLOROTHIAZIDE 12.5 MG PO CAPS
12.5000 mg | ORAL_CAPSULE | Freq: Every day | ORAL | Status: DC
  Administered 2014-11-11 – 2014-11-18 (×6): 12.5 mg via ORAL
  Filled 2014-11-11 (×8): qty 1

## 2014-11-11 MED ORDER — AMLODIPINE BESYLATE 10 MG PO TABS
10.0000 mg | ORAL_TABLET | Freq: Every day | ORAL | Status: DC
  Administered 2014-11-11 – 2014-11-18 (×6): 10 mg via ORAL
  Filled 2014-11-11 (×8): qty 1

## 2014-11-11 MED ORDER — ONDANSETRON HCL 4 MG/2ML IJ SOLN
INTRAMUSCULAR | Status: DC | PRN
  Administered 2014-11-11: 4 mg via INTRAVENOUS

## 2014-11-11 MED ORDER — HYDROCODONE-ACETAMINOPHEN 5-325 MG PO TABS
1.0000 | ORAL_TABLET | ORAL | Status: DC | PRN
  Administered 2014-11-11: 1 via ORAL
  Administered 2014-11-11: 2 via ORAL
  Administered 2014-11-11: 1 via ORAL
  Administered 2014-11-12 – 2014-11-17 (×5): 2 via ORAL
  Filled 2014-11-11 (×2): qty 2
  Filled 2014-11-11: qty 1
  Filled 2014-11-11 (×2): qty 2
  Filled 2014-11-11: qty 1
  Filled 2014-11-11 (×2): qty 2

## 2014-11-11 MED ORDER — CEFAZOLIN SODIUM-DEXTROSE 2-3 GM-% IV SOLR
INTRAVENOUS | Status: AC
  Filled 2014-11-11: qty 50

## 2014-11-11 MED ORDER — ONDANSETRON HCL 4 MG/2ML IJ SOLN: 4.0000 mg | INTRAMUSCULAR | Status: DC | PRN

## 2014-11-11 SURGICAL SUPPLY — 68 items
BANDAGE GAUZE 4  KLING STR (GAUZE/BANDAGES/DRESSINGS) IMPLANT
BIT DRILL WIRE PASS 1.3MM (BIT) IMPLANT
BLADE CLIPPER SURG (BLADE) IMPLANT
BRUSH SCRUB EZ PLAIN DRY (MISCELLANEOUS) ×3 IMPLANT
BUR ACORN 6.0 PRECISION (BURR) ×2 IMPLANT
BUR ACORN 6.0MM PRECISION (BURR) ×1
BUR SPIRAL ROUTER 2.3 (BUR) ×4 IMPLANT
BUR SPIRAL ROUTER 2.3MM (BUR) ×2
CANISTER SUCT 3000ML PPV (MISCELLANEOUS) ×3 IMPLANT
CONT SPEC 4OZ CLIKSEAL STRL BL (MISCELLANEOUS) ×3 IMPLANT
DRAIN SNY WOU 7FLT (WOUND CARE) IMPLANT
DRAPE INCISE IOBAN 66X45 STRL (DRAPES) ×3 IMPLANT
DRAPE SURG IRRIG POUCH 19X23 (DRAPES) IMPLANT
DRAPE WARM FLUID 44X44 (DRAPE) ×3 IMPLANT
DRILL WIRE PASS 1.3MM (BIT)
DRSG PAD ABDOMINAL 8X10 ST (GAUZE/BANDAGES/DRESSINGS) IMPLANT
DURAPREP 6ML APPLICATOR 50/CS (WOUND CARE) ×3 IMPLANT
ELECT CAUTERY BLADE 6.4 (BLADE) ×3 IMPLANT
ELECT REM PT RETURN 9FT ADLT (ELECTROSURGICAL) ×3
ELECTRODE REM PT RTRN 9FT ADLT (ELECTROSURGICAL) ×1 IMPLANT
EVACUATOR 1/8 PVC DRAIN (DRAIN) IMPLANT
EVACUATOR SILICONE 100CC (DRAIN) IMPLANT
GAUZE SPONGE 4X4 12PLY STRL (GAUZE/BANDAGES/DRESSINGS) ×3 IMPLANT
GAUZE SPONGE 4X4 16PLY XRAY LF (GAUZE/BANDAGES/DRESSINGS) IMPLANT
GLOVE BIO SURGEON STRL SZ 6.5 (GLOVE) ×2 IMPLANT
GLOVE BIO SURGEON STRL SZ7 (GLOVE) ×6 IMPLANT
GLOVE BIO SURGEONS STRL SZ 6.5 (GLOVE) ×1
GLOVE BIOGEL M 8.0 STRL (GLOVE) ×3 IMPLANT
GLOVE BIOGEL PI IND STRL 6.5 (GLOVE) ×1 IMPLANT
GLOVE BIOGEL PI IND STRL 7.5 (GLOVE) ×1 IMPLANT
GLOVE BIOGEL PI INDICATOR 6.5 (GLOVE) ×2
GLOVE BIOGEL PI INDICATOR 7.5 (GLOVE) ×2
GLOVE EXAM NITRILE LRG STRL (GLOVE) IMPLANT
GLOVE EXAM NITRILE MD LF STRL (GLOVE) IMPLANT
GLOVE EXAM NITRILE XL STR (GLOVE) IMPLANT
GLOVE EXAM NITRILE XS STR PU (GLOVE) IMPLANT
GOWN STRL REUS W/ TWL LRG LVL3 (GOWN DISPOSABLE) ×3 IMPLANT
GOWN STRL REUS W/ TWL XL LVL3 (GOWN DISPOSABLE) IMPLANT
GOWN STRL REUS W/TWL 2XL LVL3 (GOWN DISPOSABLE) IMPLANT
GOWN STRL REUS W/TWL LRG LVL3 (GOWN DISPOSABLE) ×6
GOWN STRL REUS W/TWL XL LVL3 (GOWN DISPOSABLE)
GRAFT DURAGEN MATRIX 2WX2L ×3 IMPLANT
HEMOSTAT SURGICEL 2X14 (HEMOSTASIS) ×3 IMPLANT
KIT BASIN OR (CUSTOM PROCEDURE TRAY) ×3 IMPLANT
KIT ROOM TURNOVER OR (KITS) ×3 IMPLANT
NS IRRIG 1000ML POUR BTL (IV SOLUTION) ×3 IMPLANT
PACK CRANIOTOMY (CUSTOM PROCEDURE TRAY) ×3 IMPLANT
PAD ARMBOARD 7.5X6 YLW CONV (MISCELLANEOUS) ×3 IMPLANT
PATTIES SURGICAL .5 X.5 (GAUZE/BANDAGES/DRESSINGS) IMPLANT
PATTIES SURGICAL .5 X3 (DISPOSABLE) IMPLANT
PATTIES SURGICAL 1X1 (DISPOSABLE) IMPLANT
PIN MAYFIELD SKULL DISP (PIN) IMPLANT
PLATE 1.5  2HOLE MED NEURO (Plate) ×6 IMPLANT
PLATE 1.5 2HOLE MED NEURO (Plate) ×3 IMPLANT
SCREW SELF DRILL HT 1.5/4MM (Screw) ×18 IMPLANT
SPONGE NEURO XRAY DETECT 1X3 (DISPOSABLE) IMPLANT
SPONGE SURGIFOAM ABS GEL 100 (HEMOSTASIS) ×3 IMPLANT
STAPLER SKIN PROX WIDE 3.9 (STAPLE) ×3 IMPLANT
SUT NURALON 4 0 TR CR/8 (SUTURE) ×3 IMPLANT
SUT VIC AB 2-0 CP2 18 (SUTURE) ×6 IMPLANT
TAPE CLOTH SURG 4X10 WHT LF (GAUZE/BANDAGES/DRESSINGS) ×3 IMPLANT
TOWEL OR 17X24 6PK STRL BLUE (TOWEL DISPOSABLE) ×3 IMPLANT
TOWEL OR 17X26 10 PK STRL BLUE (TOWEL DISPOSABLE) ×3 IMPLANT
TRAY FOLEY W/METER SILVER 14FR (SET/KITS/TRAYS/PACK) IMPLANT
TUBE CONNECTING 12'X1/4 (SUCTIONS) ×1
TUBE CONNECTING 12X1/4 (SUCTIONS) ×2 IMPLANT
UNDERPAD 30X30 INCONTINENT (UNDERPADS AND DIAPERS) IMPLANT
WATER STERILE IRR 1000ML POUR (IV SOLUTION) ×3 IMPLANT

## 2014-11-11 NOTE — Op Note (Signed)
Martin Edwards, EARLY NO.:  [id number]  MEDICAL RECORD NO.:  [id number]  LOCATION:  [facility]                         FACILITY:  [facility]  PHYSICIAN:  Leeroy Cha, M.D.   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  PROCEDURE:  Right frontotemporal parietal craniotomy.  Evacuation of mix, acute-chronic subdural hematoma.  SURGEON:  Leeroy Cha, M.D.  CLINICAL HISTORY:  [name] was brought to the hospital after he fell last night.  The patient had been complaining of headache, dizziness, and difficulty with balance.  The CT scan of the head showed a large subdural hematoma with quite a bit of shift from right to left with a mix of chronic and acute blood.  I spoke with him, I spoke with the son, and surgery was advised.  They knew the risk with the surgery including the reaccumulation, need of further surgery, infection, seizure, stroke.  DESCRIPTION OF PROCEDURE:  The patient was taken to the OR, and after intubation, the right side of the head was shaved and prepped with DuraPrep.  We also prepped the abdominal wall just in case, we needed to harvest the bone flap.  Drapes were applied to the head.  Then, an incision in the shape of S, italic from posterior frontal to upper temporal and anteroparietal was done.  Raney clips were applied to the edges for hemostasis.  The bone flap was elevated.  We made a burr hole and with the craniotome, we did a standard ovoid shaped craniotomy involving the frontal, parietal, and upper temporal bone.  The dura mater was attached to the bone and when the bone flap was elevated immediately, chronic and acute blood was found in the subdural  space. Evacuation was done.  There was a thin membrane attached to the brain which was removed.  The brain has yellowish discoloration.  At the end the procedure, the brain was pulsatile.  We left a large Jackson-Pratt drain in the posterior occipital area.  Because there was no acute bleeding and no swelling, the bone flap was put back in place using 3 small plates.  The scalp was closed using Vicryl and staples.  The drain was brought through a different incision.  The patient is going to be extubated and transferred to the intensive care unit.          ______________________________ Leeroy Cha, M.D.     EB/MEDQ  D:  11/11/2014  T:  11/11/2014  Job:  [id number]

## 2014-11-11 NOTE — ED Notes (Signed)
Claiborne Billings, PA at bedside to review results of CT scan and blood tests.

## 2014-11-11 NOTE — ED Notes (Signed)
Pt denies slurred speech, numbness or tingling in body.

## 2014-11-11 NOTE — ED Provider Notes (Signed)
CSN: [id number]     Arrival date & time 11/11/14  0317 History   First MD Initiated Contact with Patient 11/11/14 [phone]     Chief Complaint  Patient presents with  . Headache  . Weakness  . Dizziness    (Consider location/radiation/quality/duration/timing/severity/associated sxs/prior Treatment) HPI Comments: Patient is a 69 year old male with a history of hypertension, hyperlipidemia, prostate cancer, and small bowel obstruction who presents to the emergency department for further evaluation of headache. Patient reports a constant, dull frontal headache 2 weeks. He had acute worsening of his headache 1 week ago for 24 hours and began to notice dizziness approximately 2 days later. Patient describes the dizziness as a sense of being off balance. He has experienced generalized weakness and unsteady gait 3 days. Gait difficulty has caused patient to fall a few times. He denies any loss of consciousness from the fall. Patient reporting mild sensitivity to light. He experienced nausea with 1 episode of emesis on his way to the emergency department this evening. Patient [place]. He has had multiple tick bites over the last 4 days, that he can recall. Patient denies associated fever, vision changes or vision loss, tinnitus or hearing loss, difficulty speaking or swallowing, chest pain, shortness of breath, lightheadedness, syncope, abdominal pain, extremity numbness/paresthesias, and unilateral extremity weakness. Patient denies a personal history of TIA and CVA. No hx of migraine headaches or similar symptoms.  Patient is a 69 y.o. male presenting with headaches, weakness, and dizziness. The history is provided by the patient. No language interpreter was used.  Headache Associated symptoms: dizziness, nausea, photophobia, vomiting and weakness   Associated symptoms: no abdominal pain, no fever and no numbness   Weakness Associated symptoms include headaches, nausea, vomiting  and weakness. Pertinent negatives include no abdominal pain, chest pain, fever or numbness.  Dizziness Associated symptoms: headaches, nausea, vomiting and weakness   Associated symptoms: no chest pain and no shortness of breath     Past Medical History  Diagnosis Date  . Hypertension   . Hyperlipidemia   . Kidney stones     "passed them"  . Prostate cancer 1999  . Basal cell carcinoma of face X 2  . Small bowel obstruction due to adhesions 01/30/2014   Past Surgical History  Procedure Laterality Date  . Prostatectomy  05/20/1998  . Colectomy  10/2010    "took out 8 inches of colon; wasn't cancer"  . Basal cell carcinoma excision  X 2    "off my face both times"   Family History  Problem Relation Age of Onset  . Cancer Father     Lung   History  Substance Use Topics  . Smoking status: Never Smoker   . Smokeless tobacco: Never Used  . Alcohol Use: Yes     Comment: 01/30/2014 "maybe a beer twice/yr"    Review of Systems  Constitutional: Negative for fever.  Eyes: Positive for photophobia. Negative for visual disturbance.  Respiratory: Negative for shortness of breath.   Cardiovascular: Negative for chest pain.  Gastrointestinal: Positive for nausea and vomiting. Negative for abdominal pain.  Musculoskeletal: Positive for gait problem.  Neurological: Positive for dizziness, weakness and headaches. Negative for syncope, speech difficulty, light-headedness and numbness.  All other systems reviewed and are negative.   Allergies  Review of patient's allergies indicates no known allergies.  Home Medications   Prior to Admission medications   Medication Sig Start Date End Date Taking? Authorizing Provider  amLODipine (NORVASC) 10  MG tablet Take 1 tablet (10 mg total) by mouth daily. 06/05/14   Darlyne Russian, MD  atorvastatin (LIPITOR) 40 MG tablet Take 1 tablet (40 mg total) by mouth daily. 06/19/14   Darlyne Russian, MD  hydrochlorothiazide (MICROZIDE) 12.5 MG capsule Take 1  capsule (12.5 mg total) by mouth daily. 12/18/13   Darlyne Russian, MD  lisinopril (PRINIVIL,ZESTRIL) 40 MG tablet Take 1 tablet (40 mg total) by mouth daily. 06/05/14   Darlyne Russian, MD  pravastatin (PRAVACHOL) 40 MG tablet Take 1 tablet (40 mg total) by mouth daily. 06/05/14   Darlyne Russian, MD   BP 148/75 mmHg  Pulse 69  Temp(Src) 99.1 F (37.3 C) (Oral)  Resp 15  Ht 5\' 9"  (1.753 m)  Wt 170 lb (77.111 kg)  BMI 25.09 kg/m2  SpO2 98%   Physical Exam  Constitutional: He is oriented to person, place, and time. He appears well-developed and well-nourished. No distress.  Nontoxic/nonseptic appearing. Patient pleasant.  HENT:  Head: Normocephalic and atraumatic.  Mouth/Throat: Oropharynx is clear and moist. No oropharyngeal exudate.  Eyes: Conjunctivae and EOM are normal. Pupils are equal, round, and reactive to light. No scleral icterus.  Neck: Normal range of motion.  No nuchal rigidity or meningismus  Cardiovascular: Normal rate, regular rhythm and intact distal pulses.   Pulmonary/Chest: Effort normal and breath sounds normal. No respiratory distress. He has no wheezes. He has no rales.  Lungs clear to auscultation bilaterally. Chest expansion symmetric.  Musculoskeletal: Normal range of motion.  Neurological: He is alert and oriented to person, place, and time. He exhibits normal muscle tone. Coordination normal.  GCS 15. Speech is goal oriented. Patient answers questions appropriately and follows simple commands. No cranial nerve deficits appreciated; symmetric eyebrow raise, no facial drooping, tongue midline. 5/5 strength against resistance noted in the RUE and RLE; 4/5 strength against resistance noted in the LUE and LLE. Sensation to light touch intact bilaterally. Patient has no pronator drift. He moves extremities without ataxia.  Skin: Skin is warm and dry. No rash noted. He is not diaphoretic. No erythema. No pallor.  Psychiatric: He has a normal mood and affect. His behavior is  normal.  Nursing note and vitals reviewed.   ED Course  Procedures (including critical care time) Labs Review Labs Reviewed  DIFFERENTIAL - Abnormal; Notable for the following:    Neutrophils Relative % 84 (*)    Lymphocytes Relative 11 (*)    All other components within normal limits  COMPREHENSIVE METABOLIC PANEL - Abnormal; Notable for the following:    Glucose, Bld 154 (*)    Calcium 8.6 (*)    All other components within normal limits  PROTIME-INR  APTT  CBC  ROCKY MTN SPOTTED FVR ABS PNL(IGG+IGM)  B. BURGDORFI ANTIBODIES  I-STAT TROPOININ, ED    Imaging Review Ct Head (brain) Wo Contrast  11/11/2014   CLINICAL DATA:  Headache for 2 weeks. Dizziness, weakness and unsteady gait for 2-3 days. Nausea and vomiting.  EXAM: CT HEAD WITHOUT CONTRAST  TECHNIQUE: Contiguous axial images were obtained from the base of the skull through the vertex without intravenous contrast.  COMPARISON:  None.  FINDINGS: Large mixed density right subdural fluid collection with both acute and chronic blood products. This measures up to 2.6 cm in depth with associated mass effect and 1.5 cm right to left midline shift. Some layering is also noted about the tentorium. The basilar cisterns are patent. Bony calvarium is intact without fracture. Mild mucosal  thickening of the ethmoid air cells. Mastoid air cells are well aerated.  IMPRESSION: Large mixed density right subdural fluid collection with both acute and chronic blood products causing mass effect and 1.5 cm right to left midline shift.  Critical Value/emergent results were called by telephone at the time of interpretation on 11/11/2014 at 4:24 am to Dr. Julianne Rice , who verbally acknowledged these results.   Electronically Signed   By: Jeb Levering M.D.   On: 11/11/2014 04:26     ED ECG REPORT   Date: 11/11/2014  Rate: 67  Rhythm: normal sinus rhythm  QRS Axis: normal  Intervals: normal  ST/T Wave abnormalities: normal  Conduction  Disutrbances:none  Narrative Interpretation: NSR, consider RVH; no STEMI or ischemic change.  Old EKG Reviewed: none available I have personally reviewed and interpreted this EKG   CRITICAL CARE Performed by: Antonietta Breach   Total critical care time: 31  Critical care time was exclusive of separately billable procedures and treating other patients.  Critical care was necessary to treat or prevent imminent or life-threatening deterioration.  Critical care was time spent personally by me on the following activities: development of treatment plan with patient and/or surrogate as well as nursing, discussions with consultants, evaluation of patient's response to treatment, examination of patient, obtaining history from patient or surrogate, ordering and performing treatments and interventions, ordering and review of laboratory studies, ordering and review of radiographic studies, pulse oximetry and re-evaluation of patient's condition.  MDM   Final diagnoses:  Subdural hemorrhage    69 year old male with a history of hypertension and hyperlipidemia presents to the emergency department today for further evaluation of headache with associated dizziness and generalized weakness. Patient with unsteady gait over the last 3 days causing multiple falls. No syncope. No history of head trauma. Patient denies the use of blood thinners. Patient afebrile and hemodynamically stable. Neurologic exam notable for mild decreased strength against resistance in the left upper and left lower extremities.  Laboratory workup is reassuring, though CT scan reveals a large mixed density right subdural hemorrhage with both acute and chronic blood products. There is mass effect and 1.5 cm right to left midline shift. Case discussed with Dr. Joya Salm of neurosurgery. Patient to be admitted to the ICU for further monitoring and management.   Filed Vitals:   11/11/14 0329 11/11/14 0351 11/11/14 0400 11/11/14 0435  BP:  148/71 150/72 148/75   Pulse: 70 77 69   Temp: 99 F (37.2 C) 99.1 F (37.3 C)  99.1 F (37.3 C)  TempSrc:  Oral    Resp: 17 16 15    Height: 5\' 9"  (1.753 m)     Weight: 170 lb (77.111 kg)     SpO2: 98% 100% 98%      Antonietta Breach, PA-C 11/11/14 [id number]  Julianne Rice, MD 11/11/14 [phone]

## 2014-11-11 NOTE — ED Notes (Signed)
Kelly, PA at bedside. 

## 2014-11-11 NOTE — Anesthesia Postprocedure Evaluation (Signed)
Anesthesia Post Note  Patient: Martin Edwards  Procedure(s) Performed: Procedure(s) (LRB): CRANIOTOMY HEMATOMA EVACUATION SUBDURAL (Right)  Anesthesia type: General  Patient location: PACU  Post pain: Pain level controlled and Adequate analgesia  Post assessment: Post-op Vital signs reviewed, Patient's Cardiovascular Status Stable, Respiratory Function Stable, Patent Airway and Pain level controlled  Last Vitals:  Filed Vitals:   11/11/14 0830  BP: 134/64  Pulse: 79  Temp: 37.2 C  Resp: 15    Post vital signs: Reviewed and stable  Level of consciousness: awake, alert  and oriented  Complications: No apparent anesthesia complications

## 2014-11-11 NOTE — ED Notes (Signed)
C/o headache x 2 weeks.  Reports dizziness, generalized weakness, and unsteady gait x 2-3 days.

## 2014-11-11 NOTE — Anesthesia Preprocedure Evaluation (Addendum)
Anesthesia Evaluation  Patient identified by MRN, date of birth, ID band Patient awake    Reviewed: Allergy & Precautions, NPO status , Patient's Chart, lab work & pertinent test results  History of Anesthesia Complications Negative for: history of anesthetic complications  Airway Mallampati: I  TM Distance: >3 FB Neck ROM: Full    Dental  (+) Teeth Intact, Dental Advisory Given   Pulmonary  breath sounds clear to auscultation        Cardiovascular hypertension, Pt. on medications Rhythm:Regular Rate:Normal     Neuro/Psych Acute on chronic subdural hematoma    GI/Hepatic   Endo/Other    Renal/GU      Musculoskeletal   Abdominal   Peds  Hematology   Anesthesia Other Findings   Reproductive/Obstetrics                            Anesthesia Physical Anesthesia Plan  ASA: IV and emergent  Anesthesia Plan: General   Post-op Pain Management:    Induction: Intravenous and Rapid sequence  Airway Management Planned: Oral ETT  Additional Equipment:   Intra-op Plan:   Post-operative Plan: Extubation in OR and Possible Post-op intubation/ventilation  Informed Consent: I have reviewed the patients History and Physical, chart, labs and discussed the procedure including the risks, benefits and alternatives for the proposed anesthesia with the patient or authorized representative who has indicated his/her understanding and acceptance.     Plan Discussed with: Anesthesiologist and Surgeon  Anesthesia Plan Comments:         Anesthesia Quick Evaluation

## 2014-11-11 NOTE — H&P (Signed)
Martin Edwards is an 69 y.o. male.   Chief Complaint: headache HPI: patient brought to the hospital after he fell while showering. Ct head was done which showed a large right subural hemaoma with acute and chronic components  Past Medical History  Diagnosis Date  . Hypertension   . Hyperlipidemia   . Kidney stones     "passed them"  . Prostate cancer 1999  . Basal cell carcinoma of face X 2  . Small bowel obstruction due to adhesions 01/30/2014    Past Surgical History  Procedure Laterality Date  . Prostatectomy  05/20/1998  . Colectomy  10/2010    "took out 8 inches of colon; wasn't cancer"  . Basal cell carcinoma excision  X 2    "off my face both times"    Family History  Problem Relation Age of Onset  . Cancer Father     Lung   Social History:  reports that he has never smoked. He has never used smokeless tobacco. He reports that he drinks alcohol. He reports that he does not use illicit drugs.  Allergies: No Known Allergies   (Not in a hospital admission)  Results for orders placed or performed during the hospital encounter of 11/11/14 (from the past 48 hour(s))  Protime-INR     Status: None   Collection Time: 11/11/14  3:58 AM  Result Value Ref Range   Prothrombin Time 12.9 11.6 - 15.2 seconds   INR 0.95 0.00 - 1.49  APTT     Status: None   Collection Time: 11/11/14  3:58 AM  Result Value Ref Range   aPTT 27 24 - 37 seconds  CBC     Status: None   Collection Time: 11/11/14  3:58 AM  Result Value Ref Range   WBC 8.8 4.0 - 10.5 K/uL   RBC 4.53 4.22 - 5.81 MIL/uL   Hemoglobin 14.1 13.0 - 17.0 g/dL   HCT 41.6 39.0 - 52.0 %   MCV 91.8 78.0 - 100.0 fL   MCH 31.1 26.0 - 34.0 pg   MCHC 33.9 30.0 - 36.0 g/dL   RDW 13.5 11.5 - 15.5 %   Platelets 172 150 - 400 K/uL  Differential     Status: Abnormal   Collection Time: 11/11/14  3:58 AM  Result Value Ref Range   Neutrophils Relative % 84 (H) 43 - 77 %   Neutro Abs 7.4 1.7 - 7.7 K/uL   Lymphocytes Relative 11 (L)  12 - 46 %   Lymphs Abs 1.0 0.7 - 4.0 K/uL   Monocytes Relative 5 3 - 12 %   Monocytes Absolute 0.4 0.1 - 1.0 K/uL   Eosinophils Relative 0 0 - 5 %   Eosinophils Absolute 0.0 0.0 - 0.7 K/uL   Basophils Relative 0 0 - 1 %   Basophils Absolute 0.0 0.0 - 0.1 K/uL  Comprehensive metabolic panel     Status: Abnormal   Collection Time: 11/11/14  3:58 AM  Result Value Ref Range   Sodium 136 135 - 145 mmol/L   Potassium 4.0 3.5 - 5.1 mmol/L   Chloride 104 101 - 111 mmol/L   CO2 23 22 - 32 mmol/L   Glucose, Bld 154 (H) 65 - 99 mg/dL   BUN 14 6 - 20 mg/dL   Creatinine, Ser 0.99 0.61 - 1.24 mg/dL   Calcium 8.6 (L) 8.9 - 10.3 mg/dL   Total Protein 7.3 6.5 - 8.1 g/dL   Albumin 3.8 3.5 - 5.0 g/dL  AST 22 15 - 41 U/L   ALT 21 17 - 63 U/L   Alkaline Phosphatase 58 38 - 126 U/L   Total Bilirubin 0.8 0.3 - 1.2 mg/dL   GFR calc non Af Amer >60 >60 mL/min   GFR calc Af Amer >60 >60 mL/min    Comment: (NOTE) The eGFR has been calculated using the CKD EPI equation. This calculation has not been validated in all clinical situations. eGFR's persistently <60 mL/min signify possible Chronic Kidney Disease.    Anion gap 9 5 - 15  I-stat troponin, ED (not at [facility], [facility])     Status: None   Collection Time: 11/11/14  4:10 AM  Result Value Ref Range   Troponin i, poc 0.00 0.00 - 0.08 ng/mL   Comment 3            Comment: Due to the release kinetics of cTnI, a negative result within the first hours of the onset of symptoms does not rule out myocardial infarction with certainty. If myocardial infarction is still suspected, repeat the test at appropriate intervals.    Ct Head (brain) Wo Contrast  11/11/2014   CLINICAL DATA:  Headache for 2 weeks. Dizziness, weakness and unsteady gait for 2-3 days. Nausea and vomiting.  EXAM: CT HEAD WITHOUT CONTRAST  TECHNIQUE: Contiguous axial images were obtained from the base of the skull through the vertex without intravenous contrast.  COMPARISON:  None.  FINDINGS:  Large mixed density right subdural fluid collection with both acute and chronic blood products. This measures up to 2.6 cm in depth with associated mass effect and 1.5 cm right to left midline shift. Some layering is also noted about the tentorium. The basilar cisterns are patent. Bony calvarium is intact without fracture. Mild mucosal thickening of the ethmoid air cells. Mastoid air cells are well aerated.  IMPRESSION: Large mixed density right subdural fluid collection with both acute and chronic blood products causing mass effect and 1.5 cm right to left midline shift.  Critical Value/emergent results were called by telephone at the time of interpretation on 11/11/2014 at 4:24 am to Dr. Julianne Rice , who verbally acknowledged these results.   Electronically Signed   By: Jeb Levering M.D.   On: 11/11/2014 04:26    Review of Systems  Constitutional: Negative.   HENT: Negative.   Eyes: Negative.   Respiratory: Negative.   Cardiovascular: Negative.   Gastrointestinal: Negative.   Genitourinary: Negative.   Skin: Negative.   Neurological: Positive for focal weakness.  Endo/Heme/Allergies: Negative.   Psychiatric/Behavioral: Negative.     Blood pressure 138/70, pulse 74, temperature 99.1 F (37.3 C), temperature source Oral, resp. rate 24, height $RemoveBe'5\' 9"'[id number]$  (1.753 m), weight 77.111 kg (170 lb), SpO2 98 %. Physical Exam  Hent,nl. Neck, nl. Cv,nl. Lungs,clear. Abdomen. Soft extemities, nl. NEURO sleepy, 4/5 weakness left side Assessment/Plan Craniotomy asap. Son is aware of risks and benefits  Martin Edwards M 11/11/2014, 5:41 AM

## 2014-11-11 NOTE — ED Notes (Signed)
Pts son approached nurses station and states pt will have surgery. Botero present during conversation. OR ready for pt now.

## 2014-11-11 NOTE — Anesthesia Procedure Notes (Signed)
Procedure Name: Intubation Date/Time: 11/11/2014 6:27 AM Performed by: Manuela Schwartz B Pre-anesthesia Checklist: Patient identified, Emergency Drugs available, Patient being monitored, Suction available and Timeout performed Patient Re-evaluated:Patient Re-evaluated prior to inductionOxygen Delivery Method: Circle system utilized Preoxygenation: Pre-oxygenation with 100% oxygen Intubation Type: IV induction, Rapid sequence and Cricoid Pressure applied Laryngoscope Size: Mac and 3 Grade View: Grade I Tube type: Subglottic suction tube Tube size: 7.5 mm Number of attempts: 1 Airway Equipment and Method: Stylet Placement Confirmation: ETT inserted through vocal cords under direct vision,  positive ETCO2 and breath sounds checked- equal and bilateral Secured at: 21 cm Tube secured with: Tape Dental Injury: Teeth and Oropharynx as per pre-operative assessment

## 2014-11-11 NOTE — ED Notes (Signed)
Pts son states the patient has had many tick bites lately.

## 2014-11-11 NOTE — Progress Notes (Signed)
Patient ID: Martin Edwards, male   DOB: 02-12-1946, 69 y.o.   MRN: [id number] Awake, c/o incisional pain. No neuro deficits

## 2014-11-11 NOTE — ED Notes (Addendum)
MD Botero at bedside.

## 2014-11-11 NOTE — Care Management Note (Signed)
Case Management Note  Patient Details  Name: Martin Edwards MRN: [id number] Date of Birth: 08-12-1945  Subjective/Objective:      Pt admitted on 11/11/14 with large Rt SDH after fall in shower.  PTA, pt resided at home and was independent.           Action/Plan: Rt craniotomy done today.  Will follow for discharge planning as pt progresses.    Expected Discharge Date:                  Expected Discharge Plan:     In-House Referral:     Discharge planning Services   CM Referral   Post Acute Care Choice:    Choice offered to:     DME Arranged:    DME Agency:     HH Arranged:    [place] Agency:     Status of Service:   in process, will follow  Medicare Important Message Given:    Date Medicare IM Given:    Medicare IM give by:    Date Additional Medicare IM Given:    Additional Medicare Important Message give by:     If discussed at [place] of Stay Meetings, dates discussed:    Additional Comments:  Reinaldo Raddle, RN, BSN  Trauma/Neuro ICU Case Manager [phone]

## 2014-11-11 NOTE — Transfer of Care (Signed)
Immediate Anesthesia Transfer of Care Note  Patient: Martin Edwards  Procedure(s) Performed: Procedure(s): CRANIOTOMY HEMATOMA EVACUATION SUBDURAL (Right)  Patient Location: PACU  Anesthesia Type:General  Level of Consciousness: awake, alert  and oriented  Airway & Oxygen Therapy: Patient Spontanous Breathing and Patient connected to nasal cannula oxygen  Post-op Assessment: Report given to RN and Patient moving all extremities X 4  Post vital signs: Reviewed and stable  Last Vitals:  Filed Vitals:   11/11/14 0515  BP: 138/70  Pulse: 74  Temp:   Resp: 24    Complications: No apparent anesthesia complications

## 2014-11-11 NOTE — ED Notes (Signed)
Pt and family member are unsure whether or not to have surgery.

## 2014-11-12 ENCOUNTER — Encounter (HOSPITAL_COMMUNITY): Payer: Self-pay | Admitting: Neurosurgery

## 2014-11-12 DIAGNOSIS — S069X3S Unspecified intracranial injury with loss of consciousness of 1 hour to 5 hours 59 minutes, sequela: Secondary | ICD-10-CM

## 2014-11-12 DIAGNOSIS — I6200 Nontraumatic subdural hemorrhage, unspecified: Secondary | ICD-10-CM

## 2014-11-12 LAB — B. BURGDORFI ANTIBODIES: B burgdorferi Ab IgG+IgM: <0.91 {ISR} (ref 0.00–0.90)

## 2014-11-12 MED ORDER — PANTOPRAZOLE SODIUM 40 MG PO TBEC
40.0000 mg | DELAYED_RELEASE_TABLET | Freq: Every day | ORAL | Status: DC
  Administered 2014-11-12: 40 mg via ORAL

## 2014-11-12 NOTE — Progress Notes (Signed)
Chaplain initiated visit with family (pt sister) at bedside; pt sleeping. Chaplain offered emotional support and informed family of her continued services.    11/12/14 1000  Clinical Encounter Type  Visited With Family  Visit Type Initial;Spiritual support  Spiritual Encounters  Spiritual Needs Emotional;Prayer  Stress Factors  Family Stress Factors Health changes  Adria Costley, Epifanio Lesches 11/12/2014 10:29 AM

## 2014-11-12 NOTE — Evaluation (Signed)
Physical Therapy Evaluation Patient Details Name: Martin Edwards MRN: [id number] DOB: [birth year] Today's Date: 11/12/2014   History of Present Illness  Patient is a 69 year old male with a history of hypertension, hyperlipidemia, prostate cancer, and small bowel obstruction who presents to the emergency department for further evaluation of headache. Patient reports a constant, dull frontal headache 2 weeks. He had acute worsening of his headache 1 week ago for 24 hours and began to notice dizziness approximately 2 days later. Patient describes the dizziness as a sense of being off balance. He has experienced generalized weakness and unsteady gait 3 days. Gait difficulty has caused patient to fall a few times.  Pt s/p frontotemporoparietal  craniotomy.  Clinical Impression  Pt admitted with/for the problems/complications above, s/p crani..  Pt currently limited functionally due to the problems listed. ( See problems list.)   Pt will benefit from PT to maximize function and safety in order to get ready for next venue listed below.     Follow Up Recommendations CIR    Equipment Recommendations  Other (comment) (TBA)    Recommendations for Other Services Rehab consult     Precautions / Restrictions Precautions Precautions: Fall      Mobility  Bed Mobility Overal bed mobility: Needs Assistance Bed Mobility: Supine to Sit     Supine to sit: Min assist     General bed mobility comments: cues for sequencing  Transfers Overall transfer level: Needs assistance Equipment used: None Transfers: Sit to/from Stand Sit to Stand: Min assist         General transfer comment: min mostly for guidance, but some stability  Ambulation/Gait Ambulation/Gait assistance: Min assist Ambulation Distance (Feet): 150 Feet Assistive device: None (versus iv pole) Gait Pattern/deviations: Step-through pattern Gait velocity: slow with some speed increase over session.   General Gait Details:  gait slow, guarded with little dissoc of upper and lower trunk.  Pt misses the information on the left unless cued verbally and tactilely to look left.  Ofter runs into objects on the left.  Stairs            Wheelchair Mobility    Modified Rankin (Stroke Patients Only) Modified Rankin (Stroke Patients Only) Pre-Morbid Rankin Score: No symptoms Modified Rankin: Moderately severe disability     Balance Overall balance assessment: Needs assistance Sitting-balance support: No upper extremity supported Sitting balance-Leahy Scale: Fair     Standing balance support: No upper extremity supported Standing balance-Leahy Scale: Fair                               Pertinent Vitals/Pain Pain Assessment: Faces Faces Pain Scale: No hurt    Home Living Family/patient expects to be discharged to:: Private residence Living Arrangements: Alone Available Help at Discharge: Family;Available PRN/intermittently;Other (Comment) (son/brother state they will try to come up with more help) Type of Home: House Home Access: Stairs to enter     Home Layout: One level Home Equipment: Grab bars - tub/shower      Prior Function Level of Independence: Independent               Hand Dominance        Extremity/Trunk Assessment   Upper Extremity Assessment: Defer to OT evaluation           Lower Extremity Assessment: Generalized weakness (or slow recruitment of muscles or inattension)      Cervical / Trunk Assessment: Normal  Communication  Communication: No difficulties  Cognition Arousal/Alertness: Awake/alert Behavior During Therapy: WFL for tasks assessed/performed Overall Cognitive Status: Impaired/Different from baseline Area of Impairment: Attention;Following commands;Safety/judgement;Awareness;Problem solving   Current Attention Level: Sustained   Following Commands: Follows one step commands with increased time Safety/Judgement: Decreased awareness  of deficits;Decreased awareness of safety Awareness: Intellectual   General Comments: Some level of inattension/neglect to the Left    General Comments General comments (skin integrity, edema, etc.): pt moves slowly, misses important information on the left during ambulation when not cued.  Has trouble reading (his menue ticket) with readers on, suspect not getting all the information based on attempts to cue.    Exercises        Assessment/Plan    PT Assessment Patient needs continued PT services  PT Diagnosis Difficulty walking;Other (comment) (L inattension/neglect with visual disturbance)   PT Problem List Decreased strength;Decreased activity tolerance;Decreased balance;Decreased mobility;Impaired sensation (L inattension)  PT Treatment Interventions Gait training;Stair training;Functional mobility training;Therapeutic activities;Balance training;Neuromuscular re-education;Patient/family education   PT Goals (Current goals can be found in the Care Plan section) Acute Rehab PT Goals Patient Stated Goal: pt's son hope pt to get back to independence PT Goal Formulation: With patient Time For Goal Achievement: 11/26/14 Potential to Achieve Goals: Good    Frequency Min 4X/week   Barriers to discharge        Co-evaluation               End of Session   Activity Tolerance: Patient tolerated treatment well Patient left: in chair;with call bell/phone within reach;with family/visitor present Nurse Communication: Mobility status         Time: 4765-4650 PT Time Calculation (min) (ACUTE ONLY): 39 min   Charges:   PT Evaluation $Initial PT Evaluation Tier I: 1 Procedure PT Treatments $Gait Training: 8-22 mins $Therapeutic Activity: 8-22 mins   PT G Codes:        Rodert Hinch, Tessie Fass 11/12/2014, 5:50 PM 11/12/2014  Donnella Sham, PT [phone]  (pager)

## 2014-11-12 NOTE — Progress Notes (Signed)
Patient ID: Martin Edwards, male   DOB: Aug 08, 1945, 69 y.o.   MRN: [id number] Awake, f/c. Minimal pain. Rehab to see

## 2014-11-12 NOTE — Consult Note (Addendum)
Physical Medicine and Rehabilitation Consult Reason for Consult: Traumatic subdural hematoma Referring Physician: Dr. Joya Salm   HPI: Martin Edwards is a 69 y.o. right handed male with history of hypertension. Patient lives alone independent prior to admission and retired. Presented 11/11/2014 after a fall while showering. CT of the head showed a large right subdural hematoma with acute on chronic components. Underwent right frontotemporal parietal craniotomy evacuation of subdural hematoma 11/11/2014 per Dr. Joya Salm. Placed on Keppra for seizure prophylaxis. Maintain on a regular diet. Physical and occupational therapy evaluations are pending. M.D. has requested physical medicine rehabilitation consult.   Patient recalls falling in the shower, states he is retired.Loss of consciousness unknown Per nursing just received 2 hydrocodone Review of Systems  Gastrointestinal: Positive for constipation.  Musculoskeletal: Positive for myalgias and falls.  All other systems reviewed and are negative.  Past Medical History  Diagnosis Date  . Hypertension   . Hyperlipidemia   . Kidney stones     "passed them"  . Prostate cancer 1999  . Basal cell carcinoma of face X 2  . Small bowel obstruction due to adhesions 01/30/2014   Past Surgical History  Procedure Laterality Date  . Prostatectomy  05/20/1998  . Colectomy  10/2010    "took out 8 inches of colon; wasn't cancer"  . Basal cell carcinoma excision  X 2    "off my face both times"  . Craniotomy Right 11/11/2014    Procedure: CRANIOTOMY HEMATOMA EVACUATION SUBDURAL;  Surgeon: Leeroy Cha, MD;  Location: [facility];  Service: Neurosurgery;  Laterality: Right;   Family History  Problem Relation Age of Onset  . Cancer Father     Lung   Social History:  reports that he has never smoked. He has never used smokeless tobacco. He reports that he drinks alcohol. He reports that he does not use illicit drugs. Allergies: No Known  Allergies Medications Prior to Admission  Medication Sig Dispense Refill  . amLODipine (NORVASC) 10 MG tablet Take 1 tablet (10 mg total) by mouth daily. 90 tablet 1  . atorvastatin (LIPITOR) 40 MG tablet Take 1 tablet (40 mg total) by mouth daily. 90 tablet 3  . hydrochlorothiazide (MICROZIDE) 12.5 MG capsule Take 1 capsule (12.5 mg total) by mouth daily. 90 capsule 3  . lisinopril (PRINIVIL,ZESTRIL) 40 MG tablet Take 1 tablet (40 mg total) by mouth daily. 90 tablet 1  . pravastatin (PRAVACHOL) 40 MG tablet Take 1 tablet (40 mg total) by mouth daily. 90 tablet 1    Home: Home Living Family/patient expects to be discharged to:: Private residence Living Arrangements: Alone  Functional History:   Functional Status:  Mobility:          ADL:    Cognition: Cognition Orientation Level: Oriented X4    Blood pressure 116/50, pulse 63, temperature 99.6 F (37.6 C), temperature source Oral, resp. rate 15, height 5\' 9"  (1.753 m), weight 79.6 kg (175 lb 7.8 oz), SpO2 95 %. Physical Exam  Constitutional: He is oriented to person, place, and time. He appears well-developed.  HENT:  Craniotomy site is dressed without drainage  Eyes: EOM are normal.  Neck: Normal range of motion. Neck supple. No thyromegaly present.  Cardiovascular: Normal rate and regular rhythm.   Respiratory: Effort normal and breath sounds normal. No respiratory distress.  GI: Soft. Bowel sounds are normal. He exhibits no distension.  Neurological: He is alert and oriented to person, place, and time.  Follows commands. He could not  recall the full event of his fall  He has absent sensation to light touch in the left upper and left lower limb No evidence of diplopia on lateral gaze Motor strength is 4/5 bilateral deltoids, biceps, triceps, grip, hip flexor, knee extensor, ankle dorsi flexor and plantar flexor No evidence of abnormal tone Difficulty with fine motor finger to thumb opposition bilaterally.  No  results found for this or any previous visit (from the past 24 hour(s)). Ct Head (brain) Wo Contrast  11/11/2014   CLINICAL DATA:  Headache for 2 weeks. Dizziness, weakness and unsteady gait for 2-3 days. Nausea and vomiting.  EXAM: CT HEAD WITHOUT CONTRAST  TECHNIQUE: Contiguous axial images were obtained from the base of the skull through the vertex without intravenous contrast.  COMPARISON:  None.  FINDINGS: Large mixed density right subdural fluid collection with both acute and chronic blood products. This measures up to 2.6 cm in depth with associated mass effect and 1.5 cm right to left midline shift. Some layering is also noted about the tentorium. The basilar cisterns are patent. Bony calvarium is intact without fracture. Mild mucosal thickening of the ethmoid air cells. Mastoid air cells are well aerated.  IMPRESSION: Large mixed density right subdural fluid collection with both acute and chronic blood products causing mass effect and 1.5 cm right to left midline shift.  Critical Value/emergent results were called by telephone at the time of interpretation on 11/11/2014 at 4:24 am to Dr. Julianne Rice , who verbally acknowledged these results.   Electronically Signed   By: Jeb Levering M.D.   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 Does the need for close, 24 hr/day medical supervision in concert with the patient's rehab needs make it unreasonable for this patient to be served in a less intensive setting? Yes 2. Co-Morbidities requiring supervision/potential complications: Hypertension 3. Due to bladder management, bowel management, safety, skin/wound care, disease management, medication administration, pain management and patient education, does the patient require 24 hr/day rehab nursing? Yes 4. Does the patient require coordinated care of a physician, rehab  nurse, PT (1-2 hrs/day, 5 days/week), OT (1-2 hrs/day, 55 days/week) and SLP (0.5-1 hrs/day, 5 days/week) to address physical and functional deficits in the context of the above medical diagnosis(es)? Yes Addressing deficits in the following areas: balance, endurance, locomotion, strength, transferring, bowel/bladder control, bathing, dressing, feeding, grooming, toileting and cognition 5. Can the patient actively participate in an intensive therapy program of at least 3 hrs of therapy per day at least 5 days per week? Potentially 6. The potential for patient to make measurable gains while on inpatient rehab is good 7. Anticipated functional outcomes upon discharge from inpatient rehab are supervision  with PT, supervision with OT, supervision with SLP. 8. Estimated rehab length of stay to reach the above functional goals is: 10-14 days 9. Does the patient have adequate social supports and living environment to accommodate these discharge functional goals? Potentially 10. Anticipated D/C setting: Home 11. Anticipated post D/C treatments: [place] therapy 12. Overall Rehab/Functional Prognosis: excellent  RECOMMENDATIONS: This patient's condition is appropriate for continued rehabilitative care in the following setting: CIR unless patient progresses to supervision level while in acute care Patient has agreed to participate in recommended program. Potentially Note that insurance prior authorization may be required for reimbursement for recommended care.  Comment: Postoperative day #1 need to see PT and OT evaluations  11/12/2014  

## 2014-11-13 ENCOUNTER — Inpatient Hospital Stay (HOSPITAL_COMMUNITY): Payer: Medicare Other | Admitting: Certified Registered"

## 2014-11-13 ENCOUNTER — Encounter (HOSPITAL_COMMUNITY): Payer: Self-pay | Admitting: Certified Registered"

## 2014-11-13 ENCOUNTER — Encounter (HOSPITAL_COMMUNITY)
Admission: EM | Disposition: A | Discharge status: Rehabilitation Facility | Payer: Self-pay | Source: Home / Self Care | Attending: Neurosurgery

## 2014-11-13 ENCOUNTER — Inpatient Hospital Stay (HOSPITAL_COMMUNITY): Payer: Medicare Other

## 2014-11-13 DIAGNOSIS — R569 Unspecified convulsions: Secondary | ICD-10-CM

## 2014-11-13 HISTORY — DX: Unspecified convulsions: R56.9

## 2014-11-13 HISTORY — PX: CRANIOTOMY: SHX93

## 2014-11-13 LAB — RMSF, IGG, IFA: RMSF, IGG, IFA: 1:64 {titer} — ABNORMAL HIGH

## 2014-11-13 LAB — ROCKY MTN SPOTTED FVR ABS PNL(IGG+IGM)
RMSF IGG: POSITIVE — AB
RMSF IgM: 0.76 index (ref 0.00–0.89)

## 2014-11-13 SURGERY — CRANIOTOMY BONE FLAP/PROSTHETIC PLATE
Anesthesia: General | Laterality: Right

## 2014-11-13 MED ORDER — LIDOCAINE HCL (CARDIAC) 20 MG/ML IV SOLN
INTRAVENOUS | Status: DC | PRN
  Administered 2014-11-13: 70 mg via INTRAVENOUS

## 2014-11-13 MED ORDER — 0.9 % SODIUM CHLORIDE (POUR BTL) OPTIME
TOPICAL | Status: DC | PRN
  Administered 2014-11-13 (×3): 1000 mL

## 2014-11-13 MED ORDER — OXYCODONE HCL 5 MG/5ML PO SOLN: 5.0000 mg | Freq: Once | ORAL | Status: DC | PRN

## 2014-11-13 MED ORDER — ONDANSETRON HCL 4 MG/2ML IJ SOLN
INTRAMUSCULAR | Status: DC | PRN
  Administered 2014-11-13: 4 mg via INTRAVENOUS

## 2014-11-13 MED ORDER — HYDROMORPHONE HCL 1 MG/ML IJ SOLN: 0.2500 mg | INTRAMUSCULAR | Status: DC | PRN

## 2014-11-13 MED ORDER — PROMETHAZINE HCL 25 MG PO TABS
12.5000 mg | ORAL_TABLET | ORAL | Status: DC | PRN
Start: 2014-11-13 — End: 2014-11-18

## 2014-11-13 MED ORDER — PROPOFOL 10 MG/ML IV BOLUS
INTRAVENOUS | Status: AC
  Filled 2014-11-13: qty 20

## 2014-11-13 MED ORDER — ACETAMINOPHEN 650 MG RE SUPP
650.0000 mg | RECTAL | Status: DC | PRN
  Administered 2014-11-13: 650 mg via RECTAL
  Filled 2014-11-13 (×3): qty 1

## 2014-11-13 MED ORDER — MIDAZOLAM HCL 2 MG/2ML IJ SOLN
INTRAMUSCULAR | Status: AC
  Filled 2014-11-13: qty 2

## 2014-11-13 MED ORDER — DEXAMETHASONE SODIUM PHOSPHATE 4 MG/ML IJ SOLN
INTRAMUSCULAR | Status: AC
  Filled 2014-11-13: qty 1

## 2014-11-13 MED ORDER — SUFENTANIL CITRATE 50 MCG/ML IV SOLN
INTRAVENOUS | Status: DC | PRN
  Administered 2014-11-13 (×2): 10 ug via INTRAVENOUS

## 2014-11-13 MED ORDER — BACITRACIN ZINC 500 UNIT/GM EX OINT
TOPICAL_OINTMENT | CUTANEOUS | Status: DC | PRN
  Administered 2014-11-13: 1 via TOPICAL

## 2014-11-13 MED ORDER — SODIUM CHLORIDE 0.9 % IV SOLN
INTRAVENOUS | Status: DC
  Administered 2014-11-13 – 2014-11-16 (×3): via INTRAVENOUS

## 2014-11-13 MED ORDER — LACTATED RINGERS IV SOLN
INTRAVENOUS | Status: DC | PRN
  Administered 2014-11-13: 18:00:00 via INTRAVENOUS

## 2014-11-13 MED ORDER — SUFENTANIL CITRATE 50 MCG/ML IV SOLN
INTRAVENOUS | Status: AC
  Filled 2014-11-13: qty 1

## 2014-11-13 MED ORDER — PROPOFOL 10 MG/ML IV BOLUS
INTRAVENOUS | Status: DC | PRN
  Administered 2014-11-13: 200 mg via INTRAVENOUS

## 2014-11-13 MED ORDER — EPHEDRINE SULFATE 50 MG/ML IJ SOLN
INTRAMUSCULAR | Status: DC | PRN
  Administered 2014-11-13: 10 mg via INTRAVENOUS

## 2014-11-13 MED ORDER — ONDANSETRON HCL 4 MG/2ML IJ SOLN
INTRAMUSCULAR | Status: AC
  Filled 2014-11-13: qty 2

## 2014-11-13 MED ORDER — MEPERIDINE HCL 25 MG/ML IJ SOLN: 6.2500 mg | INTRAMUSCULAR | Status: DC | PRN

## 2014-11-13 MED ORDER — MICROFIBRILLAR COLL HEMOSTAT EX PADS
MEDICATED_PAD | CUTANEOUS | Status: DC | PRN
Start: 2014-11-13 — End: 2014-11-13
  Administered 2014-11-13 (×2): 1 via TOPICAL

## 2014-11-13 MED ORDER — OXYCODONE HCL 5 MG PO TABS: 5.0000 mg | ORAL_TABLET | Freq: Once | ORAL | Status: DC | PRN

## 2014-11-13 MED ORDER — ONDANSETRON HCL 4 MG/2ML IJ SOLN: 4.0000 mg | INTRAMUSCULAR | Status: DC | PRN

## 2014-11-13 MED ORDER — ROCURONIUM BROMIDE 50 MG/5ML IV SOLN
INTRAVENOUS | Status: AC
  Filled 2014-11-13: qty 1

## 2014-11-13 MED ORDER — LIDOCAINE HCL (CARDIAC) 20 MG/ML IV SOLN
INTRAVENOUS | Status: AC
  Filled 2014-11-13: qty 5

## 2014-11-13 MED ORDER — CEFAZOLIN SODIUM-DEXTROSE 2-3 GM-% IV SOLR
INTRAVENOUS | Status: AC
  Filled 2014-11-13: qty 50

## 2014-11-13 MED ORDER — THROMBIN 5000 UNITS EX SOLR
OROMUCOSAL | Status: DC | PRN
  Administered 2014-11-13 (×2): via TOPICAL

## 2014-11-13 MED ORDER — LABETALOL HCL 5 MG/ML IV SOLN
10.0000 mg | INTRAVENOUS | Status: DC | PRN
Start: 2014-11-13 — End: 2014-11-18

## 2014-11-13 MED ORDER — DEXAMETHASONE SODIUM PHOSPHATE 4 MG/ML IJ SOLN
INTRAMUSCULAR | Status: DC | PRN
  Administered 2014-11-13: 4 mg via INTRAVENOUS

## 2014-11-13 MED ORDER — CEFAZOLIN SODIUM-DEXTROSE 2-3 GM-% IV SOLR
INTRAVENOUS | Status: DC | PRN
  Administered 2014-11-13: 2 g via INTRAVENOUS

## 2014-11-13 MED ORDER — THROMBIN 20000 UNITS EX SOLR
CUTANEOUS | Status: DC | PRN
  Administered 2014-11-13: 17:00:00 via TOPICAL

## 2014-11-13 MED ORDER — ONDANSETRON HCL 4 MG PO TABS: 4.0000 mg | ORAL_TABLET | ORAL | Status: DC | PRN

## 2014-11-13 MED ORDER — CEFAZOLIN SODIUM 1-5 GM-% IV SOLN
1.0000 g | Freq: Three times a day (TID) | INTRAVENOUS | Status: AC
  Administered 2014-11-14 (×2): 1 g via INTRAVENOUS
  Filled 2014-11-13 (×2): qty 50

## 2014-11-13 MED ORDER — NALOXONE HCL 0.4 MG/ML IJ SOLN: 0.0800 mg | INTRAMUSCULAR | Status: DC | PRN

## 2014-11-13 MED ORDER — SUCCINYLCHOLINE CHLORIDE 20 MG/ML IJ SOLN
INTRAMUSCULAR | Status: DC | PRN
  Administered 2014-11-13: 100 mg via INTRAVENOUS

## 2014-11-13 MED ORDER — MORPHINE SULFATE 2 MG/ML IJ SOLN: 1.0000 mg | INTRAMUSCULAR | Status: DC | PRN

## 2014-11-13 MED ORDER — PANTOPRAZOLE SODIUM 40 MG IV SOLR
40.0000 mg | Freq: Every day | INTRAVENOUS | Status: DC
  Administered 2014-11-13 – 2014-11-14 (×2): 40 mg via INTRAVENOUS
  Filled 2014-11-13 (×3): qty 40

## 2014-11-13 MED ORDER — LEVETIRACETAM 500 MG/5ML IV SOLN
500.0000 mg | Freq: Two times a day (BID) | INTRAVENOUS | Status: DC
  Administered 2014-11-13 – 2014-11-15 (×4): 500 mg via INTRAVENOUS
  Filled 2014-11-13 (×5): qty 5

## 2014-11-13 SURGICAL SUPPLY — 76 items
BAG DECANTER FOR FLEXI CONT (MISCELLANEOUS) IMPLANT
BANDAGE GAUZE 4  KLING STR (GAUZE/BANDAGES/DRESSINGS) ×3 IMPLANT
BENZOIN TINCTURE PRP APPL 2/3 (GAUZE/BANDAGES/DRESSINGS) IMPLANT
BLADE SURG 10 STRL SS (BLADE) ×3 IMPLANT
BLADE SURG 15 STRL LF DISP TIS (BLADE) ×1 IMPLANT
BLADE SURG 15 STRL SS (BLADE) ×2
BNDG GAUZE ELAST 4 BULKY (GAUZE/BANDAGES/DRESSINGS) ×3 IMPLANT
BRUSH SCRUB EZ 1% IODOPHOR (MISCELLANEOUS) IMPLANT
CANISTER SUCT 3000ML PPV (MISCELLANEOUS) ×3 IMPLANT
CLIP RANEY DISP (INSTRUMENTS) ×9 IMPLANT
CLOSURE WOUND 1/2 X4 (GAUZE/BANDAGES/DRESSINGS)
CONT SPEC 4OZ CLIKSEAL STRL BL (MISCELLANEOUS) ×3 IMPLANT
CORDS BIPOLAR (ELECTRODE) ×3 IMPLANT
COTTONBALL LRG STERILE PKG (GAUZE/BANDAGES/DRESSINGS) ×9 IMPLANT
COVER BACK TABLE 60X90IN (DRAPES) IMPLANT
COVER MAYO STAND STRL (DRAPES) ×3 IMPLANT
DRAIN JACKSON PRATT 1/4 1325 (MISCELLANEOUS) IMPLANT
DRAPE INCISE IOBAN 66X45 STRL (DRAPES) ×6 IMPLANT
DRAPE NEUROLOGICAL W/INCISE (DRAPES) ×3 IMPLANT
DRAPE WARM FLUID 44X44 (DRAPE) ×3 IMPLANT
DURAPREP 26ML APPLICATOR (WOUND CARE) ×3 IMPLANT
ELECT CAUTERY BLADE 6.4 (BLADE) ×3 IMPLANT
ELECT REM PT RETURN 9FT ADLT (ELECTROSURGICAL) ×3
ELECTRODE REM PT RTRN 9FT ADLT (ELECTROSURGICAL) ×1 IMPLANT
EVACUATOR SILICONE 100CC (DRAIN) IMPLANT
GAUZE SPONGE 4X4 12PLY STRL (GAUZE/BANDAGES/DRESSINGS) ×6 IMPLANT
GAUZE SPONGE 4X4 16PLY XRAY LF (GAUZE/BANDAGES/DRESSINGS) ×3 IMPLANT
GLOVE BIOGEL M 8.0 STRL (GLOVE) ×3 IMPLANT
GLOVE EXAM NITRILE LRG STRL (GLOVE) IMPLANT
GLOVE EXAM NITRILE MD LF STRL (GLOVE) IMPLANT
GLOVE EXAM NITRILE XL STR (GLOVE) IMPLANT
GLOVE EXAM NITRILE XS STR PU (GLOVE) IMPLANT
GOWN STRL REUS W/ TWL LRG LVL3 (GOWN DISPOSABLE) ×1 IMPLANT
GOWN STRL REUS W/ TWL XL LVL3 (GOWN DISPOSABLE) ×2 IMPLANT
GOWN STRL REUS W/TWL 2XL LVL3 (GOWN DISPOSABLE) IMPLANT
GOWN STRL REUS W/TWL LRG LVL3 (GOWN DISPOSABLE) ×2
GOWN STRL REUS W/TWL XL LVL3 (GOWN DISPOSABLE) ×4
GRAFT DURAGEN MATRIX 2WX2L ×3 IMPLANT
HEMOSTAT POWDER SURGIFOAM 1G (HEMOSTASIS) ×6 IMPLANT
HEMOSTAT SURGICEL 2X14 (HEMOSTASIS) IMPLANT
KIT BASIN OR (CUSTOM PROCEDURE TRAY) ×3 IMPLANT
KIT REMOVER STAPLE SKIN (MISCELLANEOUS) ×3 IMPLANT
KIT ROOM TURNOVER OR (KITS) ×3 IMPLANT
NEEDLE HYPO 25X1 1.5 SAFETY (NEEDLE) ×3 IMPLANT
NS IRRIG 1000ML POUR BTL (IV SOLUTION) ×9 IMPLANT
PACK EENT II TURBAN DRAPE (CUSTOM PROCEDURE TRAY) ×3 IMPLANT
PAD ARMBOARD 7.5X6 YLW CONV (MISCELLANEOUS) ×6 IMPLANT
PATTIES SURGICAL .5 X3 (DISPOSABLE) ×3 IMPLANT
PATTIES SURGICAL 1X1 (DISPOSABLE) ×6 IMPLANT
PENCIL BUTTON HOLSTER BLD 10FT (ELECTRODE) ×3 IMPLANT
PIN MAYFIELD SKULL DISP (PIN) IMPLANT
RUBBERBAND STERILE (MISCELLANEOUS) IMPLANT
SLEEVE SURGEON STRL (DRAPES) ×3 IMPLANT
SPONGE LAP 4X18 X RAY DECT (DISPOSABLE) ×3 IMPLANT
SPONGE NEURO XRAY DETECT 1X3 (DISPOSABLE) ×6 IMPLANT
SPONGE SURGIFOAM ABS GEL 100 (HEMOSTASIS) ×3 IMPLANT
SPONGE SURGIFOAM ABS GEL SZ50 (HEMOSTASIS) IMPLANT
STAPLER SKIN PROX WIDE 3.9 (STAPLE) ×3 IMPLANT
STOCKINETTE 6  STRL (DRAPES)
STOCKINETTE 6 STRL (DRAPES) IMPLANT
STRIP CLOSURE SKIN 1/2X4 (GAUZE/BANDAGES/DRESSINGS) IMPLANT
SUT VIC AB 2-0 CP2 18 (SUTURE) ×9 IMPLANT
SUT VIC AB 2-0 CT2 18 VCP726D (SUTURE) ×3 IMPLANT
SYR 20ML ECCENTRIC (SYRINGE) ×3 IMPLANT
SYR BULB 3OZ (MISCELLANEOUS) ×3 IMPLANT
SYR BULB IRRIGATION 50ML (SYRINGE) ×6 IMPLANT
SYR CONTROL 10ML LL (SYRINGE) ×3 IMPLANT
TAPE CLOTH SURG 4X10 WHT LF (GAUZE/BANDAGES/DRESSINGS) ×3 IMPLANT
TOWEL OR 17X24 6PK STRL BLUE (TOWEL DISPOSABLE) ×3 IMPLANT
TOWEL OR 17X26 10 PK STRL BLUE (TOWEL DISPOSABLE) ×3 IMPLANT
TRAY FOLEY W/METER SILVER 14FR (SET/KITS/TRAYS/PACK) IMPLANT
TRAY FOLEY W/METER SILVER 16FR (SET/KITS/TRAYS/PACK) ×3 IMPLANT
TUBE CONNECTING 12'X1/4 (SUCTIONS) ×1
TUBE CONNECTING 12X1/4 (SUCTIONS) ×2 IMPLANT
UNDERPAD 30X30 INCONTINENT (UNDERPADS AND DIAPERS) IMPLANT
WATER STERILE IRR 1000ML POUR (IV SOLUTION) ×3 IMPLANT

## 2014-11-13 NOTE — Progress Notes (Signed)
Patient ID: Martin Edwards, male   DOB: 07-19-1945, 69 y.o.   MRN: [id number] Sleepy, neglecting the right side. Moves all w4 extremities. Ct less shift but still sdh collection. Recharge drain, spoke with his daughter . If no better will remove the bone flap.

## 2014-11-13 NOTE — Progress Notes (Signed)
Rehab admissions - I was able to meet with pt's dtr Burnadette Pop and give further information about our rehab program. Pt's son Lennette Bihari had already spoken with her about this possibility and she is in support of pursuing inpatient rehab as well. Discussion was had about copay costs associated with a possible CIR stay and further clarification of the differences between SNF and CIR were addressed per dtr's request.  We will continue to follow pt's progress and noted latest note per Dr. Joya Salm. I have started the insurance authorization process with [facility] Medicare and will keep the team posted with any updates.  Thanks.  Nanetta Batty, PT Rehabilitation Admissions Coordinator [phone]

## 2014-11-13 NOTE — Progress Notes (Addendum)
Rehab admissions - I met with pt in follow up to rehab MD consult to give more details about our rehab program. Further questions were answered and informational brochures were given. Pt is interested in pursuing inpatient rehab. Pt was able to answer my baseline questions. I noted that pt has JP drain to crani site.  I then called and spoke with pt's son. Son is in agreement to pursue inpatient rehab and I explained that rehab MD projected that pt would need supervision at the end of a rehab admit. Son shared that he works but that pt's brother and pt's dtr will likely be able to help as needed.  Pt has [employer] Medicare and we will need insurance approval to consider a possible rehab admit. I will open his case with insurance and keep the pt/family/medical team updated. I also will need completed OT evaluation to submit for insurance review and have called rehab office to see if pt could be placed on OT schedule ASAP.   Thanks.  Nanetta Batty, PT Rehabilitation Admissions Coordinator [phone]

## 2014-11-13 NOTE — Care Management Note (Signed)
Case Management Note  Patient Details  Name: Martin Edwards MRN: [id number] Date of Birth: 1946-04-07  Subjective/Objective:        PT/OT recommending IP rehab at discharge.              Action/Plan: CSW consulted for SNF backup should CIR not work out.    Expected Discharge Date:                  Expected Discharge Plan:  IP Rehab Facility  In-House Referral:  Clinical Social Work  Discharge planning Services  CM Consult  Post Acute Care Choice:    Choice offered to:     DME Arranged:    DME Agency:     HH Arranged:    [place] Agency:     Status of Service:  In process, will continue to follow  Medicare Important Message Given:    Date Medicare IM Given:    Medicare IM give by:    Date Additional Medicare IM Given:    Additional Medicare Important Message give by:     If discussed at [place] of Stay Meetings, dates discussed:    Additional Comments:  Reinaldo Raddle, RN, BSN  Trauma/Neuro ICU Case Manager [phone]

## 2014-11-13 NOTE — Progress Notes (Signed)
CSW spoke with pt daughter briefly concerning SNF as alternative to CIR- pt dtr stated that she would rather speak with CSW at a later time because pt condition has worsened and may require further surgery.  CSW will follow up with patient at a later time  Domenica Reamer, [place] Social Worker [phone]

## 2014-11-13 NOTE — Transfer of Care (Signed)
Immediate Anesthesia Transfer of Care Note  Patient: Martin Edwards  Procedure(s) Performed: Procedure(s): Removal bone flap, insertion into the abdominal wall (Right)  Patient Location: PACU  Anesthesia Type:General  Level of Consciousness: awake  Airway & Oxygen Therapy: Patient Spontanous Breathing and Patient connected to nasal cannula oxygen  Post-op Assessment: Report given to RN, Post -op Vital signs reviewed and stable and Patient moving all extremities  Post vital signs: Reviewed and stable  Last Vitals:  Filed Vitals:   11/13/14 1459  BP:   Pulse:   Temp: 38.2 C  Resp:     Complications: No apparent anesthesia complications

## 2014-11-13 NOTE — Progress Notes (Signed)
Physical Therapy Treatment Patient Details Name: Martin Edwards MRN: [id number] DOB: 1946/06/04 Today's Date: 11/13/2014    History of Present Illness Patient is a 69 year old male with a history of hypertension, hyperlipidemia, prostate cancer, and small bowel obstruction who presents to the emergency department for further evaluation of headache. Patient reports a constant, dull frontal headache 2 weeks. He had acute worsening of his headache 1 week ago for 24 hours and began to notice dizziness approximately 2 days later. Patient describes the dizziness as a sense of being off balance. He has experienced generalized weakness and unsteady gait 3 days. Gait difficulty has caused patient to fall a few times.  Pt s/p frontotemporoparietal  craniotomy.    PT Comments    More unaware of deficits.  More inattention to L side and cues not as effective in drawing pt's attention left.   Follow Up Recommendations  CIR     Equipment Recommendations  Other (comment)    Recommendations for Other Services Rehab consult     Precautions / Restrictions Precautions Precautions: Fall Precaution Comments: Pt is more inattentive and displays less initiative today    Mobility  Bed Mobility Overal bed mobility: Needs Assistance Bed Mobility: Supine to Sit     Supine to sit: Min assist     General bed mobility comments: v/t cues for sequencing and initiation  Transfers Overall transfer level: Needs assistance Equipment used: None Transfers: Sit to/from [facility] Sit to Stand: Min assist Stand pivot transfers: Min assist       General transfer comment: min mostly for guidance and now more initiation., but some stability  Ambulation/Gait Ambulation/Gait assistance: Min assist (mod as he fatigues and attention and initiation wanes.) Ambulation Distance (Feet): 150 Feet Assistive device: None Gait Pattern/deviations: Decreased step length - right;Decreased step length  - left;Step-through pattern Gait velocity: slow with some speed increase over session, but generally slower and more guarded today. Gait velocity interpretation: Below normal speed for age/gender General Gait Details: gait slow, guarded with little dissoc of upper and lower trunk.  Pt misses (even more than 5/31) the information on the left  Not able to be assisted verbally and tactilely as well today to look left.  Ofter runs into objects on the left.   Stairs            Wheelchair Mobility    Modified Rankin (Stroke Patients Only) Modified Rankin (Stroke Patients Only) Pre-Morbid Rankin Score: No symptoms Modified Rankin: Moderately severe disability     Balance Overall balance assessment: Needs assistance Sitting-balance support: No upper extremity supported Sitting balance-Leahy Scale: Fair     Standing balance support: No upper extremity supported Standing balance-Leahy Scale: Fair                      Cognition Arousal/Alertness: Awake/alert Behavior During Therapy: WFL for tasks assessed/performed;Flat affect Overall Cognitive Status: Impaired/Different from baseline Area of Impairment: Attention;Following commands;Safety/judgement;Awareness;Problem solving   Current Attention Level: Sustained Memory: Decreased short-term memory Following Commands: Follows one step commands with increased time;Follows one step commands inconsistently Safety/Judgement: Decreased awareness of safety;Decreased awareness of deficits Awareness: Intellectual Problem Solving: Slow processing;Decreased initiation;Difficulty sequencing;Requires tactile cues General Comments: Increased evel of inattension/neglect to the Left    Exercises      General Comments General comments (skin integrity, edema, etc.): Today, pt unable to utilize the cuing given to draw attension to the left as readily as eval last evening.      Pertinent Vitals/Pain  Pain Assessment: Faces Faces Pain  Scale: No hurt    Home Living                      Prior Function            PT Goals (current goals can now be found in the care plan section) Acute Rehab PT Goals Patient Stated Goal: pt's son hopes pt to get back to independence PT Goal Formulation: With patient Time For Goal Achievement: 11/26/14 Potential to Achieve Goals: Good Progress towards PT goals: Progressing toward goals    Frequency  Min 4X/week    PT Plan Current plan remains appropriate    Co-evaluation             End of Session   Activity Tolerance: Patient tolerated treatment well Patient left: in chair;with call bell/phone within reach;with family/visitor present     Time: 2482-5003 PT Time Calculation (min) (ACUTE ONLY): 26 min  Charges:  $Gait Training: 23-37 mins                    G Codes:      Travian Kerner, Tessie Fass 11/13/2014, 10:31 AM 11/13/2014  Donnella Sham, PT [phone]  (pager)

## 2014-11-13 NOTE — Anesthesia Preprocedure Evaluation (Addendum)
Anesthesia Evaluation  Patient identified by MRN, date of birth, ID band Patient awake    Reviewed: Allergy & Precautions, NPO status , Patient's Chart, lab work & pertinent test results  Airway Mallampati: II  TM Distance: >3 FB Neck ROM: Full    Dental  (+) Teeth Intact, Dental Advisory Given   Pulmonary  breath sounds clear to auscultation        Cardiovascular hypertension, Pt. on medications Rhythm:Regular Rate:Normal     Neuro/Psych S/P Craniotomy for subdural and now for movement of bone flap to abdomen.    GI/Hepatic   Endo/Other    Renal/GU      Musculoskeletal   Abdominal   Peds  Hematology   Anesthesia Other Findings   Reproductive/Obstetrics                           Anesthesia Physical Anesthesia Plan  ASA: III  Anesthesia Plan: General   Post-op Pain Management:    Induction: Intravenous  Airway Management Planned: Oral ETT  Additional Equipment:   Intra-op Plan:   Post-operative Plan: Extubation in OR  Informed Consent: I have reviewed the patients History and Physical, chart, labs and discussed the procedure including the risks, benefits and alternatives for the proposed anesthesia with the patient or authorized representative who has indicated his/her understanding and acceptance.   Dental advisory given  Plan Discussed with: CRNA, Anesthesiologist and Surgeon  Anesthesia Plan Comments:         Anesthesia Quick Evaluation

## 2014-11-13 NOTE — Anesthesia Postprocedure Evaluation (Signed)
  Anesthesia Post-op Note  Patient: Martin Edwards  Procedure(s) Performed: Procedure(s): Removal bone flap, insertion into the abdominal wall (Right)  Patient Location: PACU  Anesthesia Type:General  Level of Consciousness: awake and alert   Airway and Oxygen Therapy: Patient Spontanous Breathing  Post-op Pain: none  Post-op Assessment: Post-op Vital signs reviewed, Patient's Cardiovascular Status Stable and Respiratory Function Stable  Post-op Vital Signs: Reviewed  Filed Vitals:   11/13/14 1915  BP:   Pulse: 67  Temp:   Resp: 20    Complications: No apparent anesthesia complications

## 2014-11-13 NOTE — Anesthesia Procedure Notes (Signed)
Procedure Name: Intubation Date/Time: 11/13/2014 4:43 PM Performed by: Melina Copa, Avianna Moynahan R Pre-anesthesia Checklist: Patient identified, Emergency Drugs available, Suction available, Patient being monitored and Timeout performed Patient Re-evaluated:Patient Re-evaluated prior to inductionOxygen Delivery Method: Circle system utilized Preoxygenation: Pre-oxygenation with 100% oxygen Intubation Type: IV induction Ventilation: Mask ventilation without difficulty Laryngoscope Size: Mac and 4 Grade View: Grade II Tube type: Oral Tube size: 8.0 mm Number of attempts: 1 Airway Equipment and Method: Stylet Placement Confirmation: ETT inserted through vocal cords under direct vision,  positive ETCO2 and breath sounds checked- equal and bilateral Secured at: 22 cm Tube secured with: Tape Dental Injury: Teeth and Oropharynx as per pre-operative assessment

## 2014-11-14 ENCOUNTER — Inpatient Hospital Stay (HOSPITAL_COMMUNITY): Payer: Medicare Other

## 2014-11-14 ENCOUNTER — Encounter (HOSPITAL_COMMUNITY): Payer: Self-pay | Admitting: Neurosurgery

## 2014-11-14 MED ORDER — ACETAMINOPHEN 325 MG PO TABS
650.0000 mg | ORAL_TABLET | ORAL | Status: DC | PRN
  Administered 2014-11-14 – 2014-11-16 (×5): 650 mg via ORAL
  Filled 2014-11-14 (×5): qty 2

## 2014-11-14 NOTE — Progress Notes (Signed)
Patient ID: Martin Edwards, male   DOB: 11-14-1945, 69 y.o.   MRN: [id number] Oriented x2. Moves all 4 extremities, left less. Wounds dry. Seen by rehab.

## 2014-11-14 NOTE — Progress Notes (Signed)
Rehab admissions - I am following pt's case and noted that pt had surgery yesterday. Per MD note, "Removal of bone flap.  Evacuation of subdural hematoma. Insertion of the bone flap in the abdominal wall of the right upper quadrant of the abdomen".  I will continue to follow pt's case and will need to submit new PT/OT notes for insurance pre-authorization post surgery.  Thanks.  Nanetta Batty, PT Rehabilitation Admissions Coordinator [phone]

## 2014-11-14 NOTE — Op Note (Signed)
NAMEELAND, LAMANTIA NO.:  [id number]  MEDICAL RECORD NO.:  [id number]  LOCATION:  3M02C                        FACILITY:  Uncertain  PHYSICIAN:  Leeroy Cha, M.D.   DATE OF BIRTH:  March 28, 1946  DATE OF PROCEDURE:  11/13/2014 DATE OF DISCHARGE:                              OPERATIVE REPORT   PREOPERATIVE DIAGNOSIS:  Recurrent subdural hematoma, right frontotemporal parietal area.  POSTOPERATIVE DIAGNOSIS:  Recurrent subdural hematoma, right frontotemporal parietal area.  PROCEDURE:  Removal of bone flap.  Evacuation of subdural hematoma. Insertion of the bone flap in the abdominal wall of the right upper quadrant of the abdomen.  SURGEON:  Leeroy Cha, M.D.  ASSISTANT:  Hosie Spangle, M.D.  CLINICAL HISTORY:  Martin Edwards is a gentleman, who had surgery 3 days ago as emergency because of a large subdural hematoma with a shift of almost 16 mm.  The patient did well, was awake, but this morning, he became quite sleepy.  We repeated the CT scan _which showed the_________ drain was in place, nevertheless he was still having quite a bit of subdural hematoma.  The shift was down from 15 to 7.  Nevertheless because he was sleepy, I talked to the family.  They knew from the beginning that there was a possibility of removing the bone flap.  DESCRIPTION OF PROCEDURE:  The patient was taken to the OR, and after intubation, the right side of the abdomen as well as the right side of the head was prepped with Betadine and DuraPrep.  Drapes were applied. We proceeded in the head with removal of staples, and we elevated the scalp flap.  The 3 plates which were keeping the bone adherent to the skull were removed.  We opened the dura mater which was loose and immediately there was quite a bit of blood clot.  The drain was removed on the drain was right in the middle of the blood clot.  The dissection was carried down.  It took [place] at least 20 minute to stop the  bleeding. There was just a small amount of bleeding coming from the medial aspect of the brain toward the superior sagittal sinus.  It was mostly really with a small bleed and at the end, we had to use Avitene as well as Gelfoam to be able to stop the bleeding.  There was no copious amount of bleeding, but we were __unable________ to close unless we were completely dry. This was accomplished.  The decision was not to put any drain because the previous one did not work__________ and second we were worry about _the hyperemia__will give us_______ more bleeding with hypersensitivity of the brain cortex.  The dura mater was closed loosely, we used some extra DuraGen.  The bone flap was put back in place with Vicryl and staples.  Then, we opened the skin in the upper quadrant of the abdomen through the skin and subcutaneous tissue, I made a pocket in the adipose tissue and the bone flap was inserted.  That area was irrigated and closed with Vicryl and staples.  The patient is going to be extubated and transferred back to the ICU.  ______________________________ Leeroy Cha, M.D.     EB/MEDQ  D:  11/13/2014  T:  11/14/2014  Job:  [id number]

## 2014-11-14 NOTE — Care Management Note (Signed)
Case Management Note  Patient Details  Name: Martin Edwards MRN: [id number] Date of Birth: [birth year]  Subjective/Objective:      Pt returned to OR on 11/13/14 for removal of bone flap and further evacuation of SDH.               Action/Plan: PT/OT cont to recommend CIR and admissions liasion from IP rehab following for possible admission.  CSW following for SNF backup.    Expected Discharge Date:                  Expected Discharge Plan:  IP Rehab Facility  In-House Referral:  Clinical Social Work  Discharge planning Services  CM Consult  Post Acute Care Choice:    Choice offered to:     DME Arranged:    DME Agency:     HH Arranged:    [place] Agency:     Status of Service:  In process, will continue to follow  Medicare Important Message Given:    Date Medicare IM Given:    Medicare IM give by:    Date Additional Medicare IM Given:    Additional Medicare Important Message give by:     If discussed at [place] of Stay Meetings, dates discussed:    Additional Comments:  Reinaldo Raddle, RN, BSN  Trauma/Neuro ICU Case Manager [phone]

## 2014-11-14 NOTE — Evaluation (Signed)
Occupational Therapy Evaluation Patient Details Name: JAELEN SOTH MRN: [id number] DOB: 12/25/1945 Today's Date: 11/14/2014    History of Present Illness Patient is a 69 year old male with a history of hypertension, hyperlipidemia, prostate cancer, and small bowel obstruction who presents to the emergency department for further evaluation of headache. Patient reports a constant, dull frontal headache 2 weeks. He had acute worsening of his headache 1 week ago for 24 hours and began to notice dizziness approximately 2 days later. Patient describes the dizziness as a sense of being off balance. He has experienced generalized weakness and unsteady gait 3 days. Gait difficulty has caused patient to fall a few times.  Pt s/p frontotemporoparietal  craniotomy. 6/1 Pt had a R craniectomy with bone flap on R abdomen   Clinical Impression   Pt admitted with headache  Pt currently with functional limitations due to the deficits listed below (see OT Problem List). Pt will benefit from skilled OT to increase their safety and independence with ADL and functional mobility for ADL to facilitate discharge to venue listed below.      Follow Up Recommendations  CIR    Equipment Recommendations  None recommended by OT    Recommendations for Other Services Rehab consult     Precautions / Restrictions Precautions Precautions: Fall Precaution Comments: L inattention Restrictions Weight Bearing Restrictions: No      Mobility Bed Mobility Overal bed mobility: Needs Assistance Bed Mobility: Supine to Sit     Supine to sit: Max assist     General bed mobility comments: verbal cues for sequencing and initiation  Transfers Overall transfer level: Needs assistance Equipment used: None   Sit to Stand: +2 safety/equipment;+2 physical assistance;Mod assist Stand pivot transfers: +2 safety/equipment;+2 physical assistance;Mod assist       General transfer comment: Pt with significant lean to R  right.  Pt not able to self correct    Balance Overall balance assessment: Needs assistance Sitting-balance support: Feet supported Sitting balance-Leahy Scale: Poor       Standing balance-Leahy Scale: Poor                              ADL Overall ADL's : Needs assistance/impaired     Grooming: Sitting;Maximal assistance;Cueing for sequencing;Cueing for safety;Cueing for compensatory techniques Grooming Details (indicate cue type and reason): simulated                               General ADL Comments: Pt with significant L inattention.  Pt needs mod VC to look left. Pt is able to see itmes on left once he looks left but needs VC to do so.  OT needs to further assess vision.  No active hand movement noted this day. OT did note R elbow flexion and extension     Vision Vision Assessment?: Yes Ocular Range of Motion: Restricted on the left;Impaired-to be further tested in functional context Alignment/Gaze Preference: Head turned Saccades: Impaired - to be further tested in functional context Convergence: Impaired - to be further tested in functional context Visual Fields: Impaired-to be further tested in functional context Additional Comments: significant L inattention.  Pt able to look to left, see OT to left, identify numbers to L with Max VC          Pertinent Vitals/Pain Faces Pain Scale: No hurt        Extremity/Trunk Assessment Upper Extremity  Assessment Upper Extremity Assessment: LUE deficits/detail LUE Deficits / Details: no active wrist extension or finger movement noted.  Did note shoulder flexion and elbow flexion/ extension LUE Sensation: decreased proprioception LUE Coordination: decreased fine motor;decreased gross motor           Communication Communication Communication: No difficulties   Cognition Arousal/Alertness: Awake/alert Behavior During Therapy: WFL for tasks assessed/performed;Flat affect Overall Cognitive Status:  Impaired/Different from baseline Area of Impairment: Attention;Following commands;Safety/judgement;Awareness;Problem solving   Current Attention Level: Sustained Memory: Decreased short-term memory Following Commands: Follows one step commands with increased time;Follows one step commands inconsistently Safety/Judgement: Decreased awareness of safety;Decreased awareness of deficits Awareness: Intellectual Problem Solving: Slow processing;Decreased initiation;Difficulty sequencing;Requires tactile cues General Comments: Increased evel of inattension/neglect to the Left   General Comments               Home Living Family/patient expects to be discharged to:: Private residence Living Arrangements: Alone Available Help at Discharge: Family;Available PRN/intermittently;Other (Comment) (son/brother state they will try to come up with more help) Type of Home: House Home Access: Stairs to enter     Home Layout: One level               Home Equipment: Grab bars - tub/shower          Prior Functioning/Environment Level of Independence: Independent             OT Diagnosis: Generalized weakness;Disturbance of vision;Altered mental status;Cognitive deficits   OT Problem List: Decreased strength;Decreased activity tolerance;Impaired balance (sitting and/or standing);Decreased range of motion;Impaired UE functional use;Decreased knowledge of precautions;Impaired vision/perception;Decreased knowledge of use of DME or AE;Decreased safety awareness;Decreased cognition;Decreased coordination   OT Treatment/Interventions: Self-care/ADL training;DME and/or AE instruction;Patient/family education;Neuromuscular education;Therapeutic exercise;Visual/perceptual remediation/compensation    OT Goals(Current goals can be found in the care plan section) Acute Rehab OT Goals Patient Stated Goal: did not state OT Goal Formulation: With patient Time For Goal Achievement: 11/28/14 Potential  to Achieve Goals: Good ADL Goals Pt Will Perform Eating: with mod assist;sitting Pt Will Perform Grooming: with min assist;sitting Pt/caregiver will Perform Home Exercise Program: Left upper extremity;With written HEP provided;With minimal assist Additional ADL Goal #1: Pt will be able to locate items on L side with min A during grooming activity   OT Frequency: Min 2X/week    End of Session Nurse Communication: Mobility status  Activity Tolerance: Patient tolerated treatment well Patient left: in chair;with call bell/phone within reach   Time: 1115-1142 OT Time Calculation (min): 27 min Charges:  OT General Charges $OT Visit: 1 Procedure OT Evaluation $Initial OT Evaluation Tier I: 1 Procedure G-Codes:    Payton Mccallum D 2014-11-17, 11:59 AM

## 2014-11-14 NOTE — Evaluation (Signed)
Clinical/Bedside Swallow Evaluation Patient Details  Name: Martin Edwards MRN: [id number] Date of Birth: 08-Mar-1946  Today's Date: 11/14/2014 Time: SLP Start Time (ACUTE ONLY): 7846 SLP Stop Time (ACUTE ONLY): 1427 SLP Time Calculation (min) (ACUTE ONLY): 32 min  Past Medical History:  Past Medical History  Diagnosis Date  . Hypertension   . Hyperlipidemia   . Kidney stones     "passed them"  . Prostate cancer 1999  . Basal cell carcinoma of face X 2  . Small bowel obstruction due to adhesions 01/30/2014   Past Surgical History:  Past Surgical History  Procedure Laterality Date  . Prostatectomy  05/20/1998  . Colectomy  10/2010    "took out 8 inches of colon; wasn't cancer"  . Basal cell carcinoma excision  X 2    "off my face both times"  . Craniotomy Right 11/11/2014    Procedure: CRANIOTOMY HEMATOMA EVACUATION SUBDURAL;  Surgeon: Leeroy Cha, MD;  Location: [facility];  Service: Neurosurgery;  Laterality: Right;  . Craniotomy Right 11/13/2014    Procedure: Removal bone flap, insertion into the abdominal wall;  Surgeon: Leeroy Cha, MD;  Location: [facility];  Service: Neurosurgery;  Laterality: Right;   HPI:  Patient is a 69 year old male with a history of hypertension, hyperlipidemia, prostate cancer, and small bowel obstruction who presents to the emergency department for further evaluation of headache. Patient reports a constant, dull frontal headache 2 weeks. He had acute worsening of his headache 1 week ago for 24 hours and began to notice dizziness approximately 2 days later. Patient describes the dizziness as a sense of being off balance. He has experienced generalized weakness and unsteady gait 3 days. Gait difficulty has caused patient to fall a few times. Pt s/p frontotemporoparietal craniotomy. Back to sx 6/1 for craniectomy. Flap in R stomach pouch.   Assessment / Plan / Recommendation Clinical Impression  Pt has a moderate oral dysphagia due to left-sided  weakness and inattention. Anterior loss noted with liquids as well as left-sided pocketing of entire bolus with Dys 2 textures, requiring Max assist from therapist to clear. Pt has much improved oral clearance with pureed foods with use of right-sided placement and liquid washes. Will adjust diet to Dys 1 textures, continuing thin liquids. Will continue to follow for diet tolerance and advancement - recommend MD order SLP cognitive-linguistic evaluation as well.    Aspiration Risk  Mild    Diet Recommendation Dysphagia 1 (Puree);Thin   Medication Administration: Whole meds with puree Compensations: Slow rate;Small sips/bites;Check for pocketing;Check for anterior loss;Follow solids with liquid    Other  Recommendations Oral Care Recommendations: Oral care BID;Other (Comment) (oral care after POs)   Follow Up Recommendations       Frequency and Duration min 2x/week  2 weeks   Pertinent Vitals/Pain n/a    SLP Swallow Goals     Swallow Study Prior Functional Status  Type of Home: House Available Help at Discharge: Family;Available PRN/intermittently;Other (Comment) (son/brother state they will try to come up with more help)    General Other Pertinent Information: Patient is a 69 year old male with a history of hypertension, hyperlipidemia, prostate cancer, and small bowel obstruction who presents to the emergency department for further evaluation of headache. Patient reports a constant, dull frontal headache 2 weeks. He had acute worsening of his headache 1 week ago for 24 hours and began to notice dizziness approximately 2 days later. Patient describes the dizziness as a sense of being off balance. He has  experienced generalized weakness and unsteady gait 3 days. Gait difficulty has caused patient to fall a few times. Pt s/p frontotemporoparietal craniotomy. Back to sx 6/1 for craniectomy. Flap in R stomach pouch. Type of Study: Bedside swallow evaluation Previous Swallow  Assessment: none in chart Diet Prior to this Study: Regular;Thin liquids Temperature Spikes Noted: Yes (101.6) Respiratory Status: Room air History of Recent Intubation: No Behavior/Cognition: Lethargic/Drowsy;Cooperative;Requires cueing;Pleasant mood Oral Cavity - Dentition: Adequate natural dentition/normal for age Self-Feeding Abilities: Needs assist Patient Positioning: Upright in chair/Tumbleform Baseline Vocal Quality: Low vocal intensity    Oral/Motor/Sensory Function Overall Oral Motor/Sensory Function: Impaired Labial ROM: Reduced left Labial Strength: Reduced Labial Sensation: Reduced Lingual Strength: Reduced Facial Strength: Reduced Facial Sensation: Reduced   Ice Chips Ice chips: Not tested   Thin Liquid Thin Liquid: Impaired Presentation: Self Fed;Cup;Straw Oral Phase Impairments: Reduced labial seal;Poor awareness of bolus Oral Phase Functional Implications: Left anterior spillage    Nectar Thick Nectar Thick Liquid: Not tested   Honey Thick Honey Thick Liquid: Not tested   Puree Puree: Impaired Presentation: Self Fed;Spoon Oral Phase Impairments: Reduced labial seal;Reduced lingual movement/coordination;Impaired anterior to posterior transit;Poor awareness of bolus Oral Phase Functional Implications: Left lateral sulci pocketing;Oral residue;Prolonged oral transit   Solid    Solid: Impaired Presentation: Self Fed;Spoon Oral Phase Impairments: Reduced labial seal;Reduced lingual movement/coordination;Impaired anterior to posterior transit;Poor awareness of bolus Oral Phase Functional Implications: Left lateral sulci pocketing;Oral residue      Germain Osgood, M.A. CCC-SLP [phone]  Germain Osgood 11/14/2014,2:41 PM

## 2014-11-14 NOTE — Progress Notes (Signed)
Physical Therapy Treatment Patient Details Name: Martin Edwards MRN: [id number] DOB: [birth year] Today's Date: 11/14/2014    History of Present Illness Patient is a 69 year old male with a history of hypertension, hyperlipidemia, prostate cancer, and small bowel obstruction who presents to the emergency department for further evaluation of headache. Patient reports a constant, dull frontal headache 2 weeks. He had acute worsening of his headache 1 week ago for 24 hours and began to notice dizziness approximately 2 days later. Patient describes the dizziness as a sense of being off balance. He has experienced generalized weakness and unsteady gait 3 days. Gait difficulty has caused patient to fall a few times.  Pt s/p frontotemporoparietal  craniotomy. Back to sx 6/1 for craniectomy.  Flap in R stomach pouch.    PT Comments    Pt has improved in being able to direct attention to the Left, but is less coordinated in movement of less side.  Unable to ambulate today and transferred bed to chair with difficulty.  Follow Up Recommendations  CIR     Equipment Recommendations       Recommendations for Other Services Rehab consult     Precautions / Restrictions Precautions Precautions: Fall Precaution Comments: L inattention has improved, but still present    Mobility  Bed Mobility Overal bed mobility: Needs Assistance Bed Mobility: Supine to Sit     Supine to sit: Max assist     General bed mobility comments: verbal cues for sequencing and initiation  Transfers Overall transfer level: Needs assistance Equipment used: None Transfers: Sit to/from [employer] Transfers Sit to Stand: +2 safety/equipment;+2 physical assistance;Mod assist Stand pivot transfers: +2 safety/equipment;+2 physical assistance;Mod assist       General transfer comment: Pt with significant lean to R right.  Pt not able to self correct  Ambulation/Gait             General Gait Details: unable  today.   Stairs            Wheelchair Mobility    Modified Rankin (Stroke Patients Only) Modified Rankin (Stroke Patients Only) Pre-Morbid Rankin Score: No symptoms Modified Rankin: Severe disability     Balance Overall balance assessment: Needs assistance Sitting-balance support: Feet supported Sitting balance-Leahy Scale: Poor Sitting balance - Comments: lists off to the left   Standing balance support: Bilateral upper extremity supported Standing balance-Leahy Scale: Poor Standing balance comment: heavy lean L with extensive w/shift assist to R                    Cognition Arousal/Alertness: Awake/alert Behavior During Therapy: Flat affect (improved intitiation) Overall Cognitive Status: Impaired/Different from baseline Area of Impairment: Attention;Following commands;Safety/judgement;Awareness;Problem solving   Current Attention Level: Sustained Memory: Decreased short-term memory Following Commands: Follows one step commands with increased time;Follows one step commands inconsistently Safety/Judgement: Decreased awareness of safety;Decreased awareness of deficits Awareness: Intellectual Problem Solving: Slow processing;Decreased initiation;Difficulty sequencing;Requires tactile cues General Comments: Increased evel of inattension/neglect to the Left    Exercises      General Comments General comments (skin integrity, edema, etc.): Basically, Less inattension to the left, but still prefers to turn head R and gaze right.  Initiaties mobility better, but can't execute tasks with L Side.      Pertinent Vitals/Pain Pain Assessment: No/denies pain Faces Pain Scale: No hurt    Home Living Family/patient expects to be discharged to:: Private residence Living Arrangements: Alone Available Help at Discharge: Family;Available PRN/intermittently;Other (Comment) (son/brother state they will try to  come up with more help) Type of Home: House Home Access:  Stairs to enter   Home Layout: One level Home Equipment: Grab bars - tub/shower      Prior Function Level of Independence: Independent          PT Goals (current goals can now be found in the care plan section) Acute Rehab PT Goals Patient Stated Goal: did not state PT Goal Formulation: With patient Time For Goal Achievement: 11/26/14 Potential to Achieve Goals: Good Progress towards PT goals: Progressing toward goals (but some negatives also.)    Frequency  Min 4X/week    PT Plan Current plan remains appropriate    Co-evaluation             End of Session   Activity Tolerance: Patient tolerated treatment well Patient left: in chair;with call bell/phone within reach;with family/visitor present     Time: 1115-1141 PT Time Calculation (min) (ACUTE ONLY): 26 min  Charges:  $Therapeutic Activity: 8-22 mins                    G Codes:      Terrence Pizana, Tessie Fass 11/14/2014, 1:55 PM 11/14/2014  Donnella Sham, PT [phone]  (pager)

## 2014-11-15 ENCOUNTER — Inpatient Hospital Stay (HOSPITAL_COMMUNITY): Payer: Medicare Other

## 2014-11-15 DIAGNOSIS — R569 Unspecified convulsions: Secondary | ICD-10-CM

## 2014-11-15 LAB — BASIC METABOLIC PANEL
Anion gap: 8 (ref 5–15)
BUN: 11 mg/dL (ref 6–20)
CALCIUM: 7.8 mg/dL — AB (ref 8.9–10.3)
CO2: 24 mmol/L (ref 22–32)
Chloride: 97 mmol/L — ABNORMAL LOW (ref 101–111)
Creatinine, Ser: 0.81 mg/dL (ref 0.61–1.24)
GFR calc non Af Amer: >60 mL/min (ref >60)
Glucose, Bld: 133 mg/dL — ABNORMAL HIGH (ref 65–99)
POTASSIUM: 3.3 mmol/L — AB (ref 3.5–5.1)
SODIUM: 129 mmol/L — AB (ref 135–145)

## 2014-11-15 LAB — PHENYTOIN LEVEL, TOTAL: Phenytoin Lvl: 11.9 ug/mL (ref 10.0–20.0)

## 2014-11-15 MED ORDER — SODIUM CHLORIDE 0.9 % IV SOLN
750.0000 mg | Freq: Two times a day (BID) | INTRAVENOUS | Status: DC
  Filled 2014-11-15: qty 7.5

## 2014-11-15 MED ORDER — SODIUM CHLORIDE 0.9 % IV SOLN: 1000.0000 mg | Freq: Once | INTRAVENOUS | Status: DC

## 2014-11-15 MED ORDER — LEVETIRACETAM 500 MG/5ML IV SOLN
1000.0000 mg | Freq: Once | INTRAVENOUS | Status: AC
  Administered 2014-11-15: 1000 mg via INTRAVENOUS
  Filled 2014-11-15: qty 10

## 2014-11-15 MED ORDER — LEVETIRACETAM 500 MG/5ML IV SOLN
1000.0000 mg | INTRAVENOUS | Status: AC
  Administered 2014-11-15: 1000 mg via INTRAVENOUS
  Filled 2014-11-15: qty 10

## 2014-11-15 MED ORDER — LEVETIRACETAM 500 MG/5ML IV SOLN
1000.0000 mg | Freq: Two times a day (BID) | INTRAVENOUS | Status: DC
  Administered 2014-11-15 – 2014-11-18 (×6): 1000 mg via INTRAVENOUS
  Filled 2014-11-15 (×7): qty 10

## 2014-11-15 MED ORDER — SODIUM CHLORIDE 0.9 % IV SOLN
1000.0000 mg | Freq: Once | INTRAVENOUS | Status: AC
  Administered 2014-11-15: 1000 mg via INTRAVENOUS
  Filled 2014-11-15: qty 20

## 2014-11-15 MED ORDER — LORAZEPAM 2 MG/ML IJ SOLN
INTRAMUSCULAR | Status: AC
  Administered 2014-11-15: 1 mg
  Filled 2014-11-15: qty 1

## 2014-11-15 MED ORDER — PHENYTOIN 125 MG/5ML PO SUSP
200.0000 mg | Freq: Two times a day (BID) | ORAL | Status: DC
  Filled 2014-11-15: qty 8

## 2014-11-15 MED ORDER — PHENYTOIN SODIUM 50 MG/ML IJ SOLN
100.0000 mg | Freq: Three times a day (TID) | INTRAMUSCULAR | Status: DC
  Administered 2014-11-16 – 2014-11-18 (×8): 100 mg via INTRAVENOUS
  Filled 2014-11-15 (×10): qty 2

## 2014-11-15 MED ORDER — LORAZEPAM 2 MG/ML IJ SOLN
INTRAMUSCULAR | Status: AC
  Administered 2014-11-15: 2 mg
  Filled 2014-11-15: qty 1

## 2014-11-15 MED ORDER — PHENYTOIN SODIUM EXTENDED 100 MG PO CAPS
200.0000 mg | ORAL_CAPSULE | Freq: Two times a day (BID) | ORAL | Status: DC
  Filled 2014-11-15: qty 2

## 2014-11-15 MED ORDER — PANTOPRAZOLE SODIUM 40 MG PO TBEC
40.0000 mg | DELAYED_RELEASE_TABLET | Freq: Every day | ORAL | Status: DC
  Administered 2014-11-16 – 2014-11-17 (×2): 40 mg via ORAL
  Filled 2014-11-15 (×2): qty 1

## 2014-11-15 MED ORDER — SODIUM CHLORIDE 0.9 % IV SOLN
1.0000 g | Freq: Once | INTRAVENOUS | Status: AC
Start: 2014-11-15 — End: 2014-11-16
  Administered 2014-11-15: 1 g via INTRAVENOUS
  Filled 2014-11-15: qty 10

## 2014-11-15 MED ORDER — CEFTRIAXONE SODIUM IN DEXTROSE 40 MG/ML IV SOLN
2.0000 g | INTRAVENOUS | Status: DC
  Administered 2014-11-15 – 2014-11-17 (×3): 2 g via INTRAVENOUS
  Filled 2014-11-15 (×4): qty 50

## 2014-11-15 MED ORDER — SODIUM CHLORIDE 0.9 % IV SOLN
600.0000 mg | Freq: Once | INTRAVENOUS | Status: AC
  Administered 2014-11-15: 600 mg via INTRAVENOUS
  Filled 2014-11-15: qty 12

## 2014-11-15 MED ORDER — ACETAMINOPHEN 650 MG RE SUPP
650.0000 mg | Freq: Once | RECTAL | Status: AC
  Administered 2014-11-15: 650 mg via RECTAL

## 2014-11-15 NOTE — Consult Note (Signed)
Neurology Consultation Reason for Consult: Frequent Seizures Referring Physician: Larose Hires, K  CC: Seizures  History is obtained from:Patient's daughter, staff  HPI: Martin Edwards is a 69 y.o. male who presented with SDH after he fell while showering. He was taken for emergent evacuation and admitted to the neuro ICU. Follow up Ct showed continued blood collection and he  his bone flap removed on 06/02.   This afternoon he began having focal seizures and was given keppra as well as dilantin. He has had an increasing frequency in seizures as well.   Currently, he continues to be able to speak and interact while not seizing, but is having frequent breif (30 second) seizures every 10 minutes or so. The seizures consists of behavioral arrest, left gaze deviation, facial and sometimes arm twitching. He is able to speak immediately post-ictally.   ROS: Unable to obtain due to altered mental status.   Past Medical History  Diagnosis Date  . Hypertension   . Hyperlipidemia   . Kidney stones     "passed them"  . Prostate cancer 1999  . Basal cell carcinoma of face X 2  . Small bowel obstruction due to adhesions 01/30/2014    Family History: Unable to assess secondary to patient's altered mental status.    Social History: Tob: Unable to assess secondary to patient's altered mental status.   Exam: Current vital signs: BP 134/66 mmHg  Pulse 91  Temp(Src) 102.6 F (39.2 C) (Oral)  Resp 17  Ht 5\' 9"  (1.753 m)  Wt 79.6 kg (175 lb 7.8 oz)  BMI 25.90 kg/m2  SpO2 95% Vital signs in last 24 hours: Temp:  [99.7 F (37.6 C)-102.6 F (39.2 C)] 102.6 F (39.2 C) (06/03 1551) Pulse Rate:  [62-92] 91 (06/03 1900) Resp:  [15-20] 17 (06/03 1500) BP: (116-147)/(51-68) 134/66 mmHg (06/03 1900) SpO2:  [94 %-98 %] 95 % (06/03 1900)  Physical Exam  Constitutional: Appears well-developed and well-nourished.  Psych: Appears restless Eyes: No scleral injection HENT: No OP obstrucion Head:  Normocephalic.  Cardiovascular: Normal rate and regular rhythm.  Respiratory: Effort normal and breath sounds normal to anterior ascultation GI: Soft.  No distension. There is no tenderness.  Skin: WDI  Neuro: Mental Status: Patient is able to follow simple commands and answer some simple questions, though does appear to be delirious.  No signs of aphasia, but he does appear to neglect the left side.  Cranial Nerves: II: able to count fingers in right visual field, does not blink to threat from the left. Pupils are equal, round, and reactive to light.   III,IV, VI: Has right gaze preference a trest, left gaze deviation during seizures.  V: Endorses facial sensation bilaterally.  VII: Does not smile when asked, does appear to have decreased NL fold on the left.  VIII, X, XI, XII: Unable to assess secondary to patient's altered mental status.  Motor: He moves his right side well with good strength, left arm and leg with severe weakness though is able to grip to command and does withdraw left leg to pain.   Sensory: Responds less to nox stim on the left than right.  Cerebellar: Unable to assess secondary to patient's altered mental status.     I have reviewed labs in epic and the results pertinent to this consultation are: Urine culture(staph).   I have reviewed the images obtained: CT head - SDH on the Right.   Impression: 69 yo M with SDH and new recurrent, refractory focal seizures.  I suspect that his infection(UTI) is contributing significantly to a lower seizure threshold. He has been started on two AEDs and there is room to increase each of these.  This type of frequent focal seizures can sometimes be very difficult to control, but given the preservation of consciousness, I would be hesitant to treat with overly aggressive measures such as intubation/burst suppression.   Recommendations: 1) Agree with treatment of UTI 2) Give additional 1 gm IV keppra now.  3) BMP 4) dilantin  level with possible additional load of dilantin depending on level, response to keppra dose.  5) increase keppra to 1gm BID.  6) will continue to follow   Roland Rack, MD [facility] [phone]  If 7pm- 7am, please page neurology on call as listed in [place].

## 2014-11-15 NOTE — Progress Notes (Signed)
SLP Cancellation Note  Patient Details Name: Martin Edwards MRN: [id number] DOB: 15-Aug-1945   Cancelled treatment:       Reason Eval/Treat Not Completed: Medical issues which prohibited therapy   Juan Quam Laurice 11/15/2014, 1:58 PM

## 2014-11-15 NOTE — Progress Notes (Signed)
OT Cancellation Note  Patient Details Name: DUAN SCHARNHORST MRN: [id number] DOB: 11-17-1945   Cancelled Treatment:    Reason Eval/Treat Not Completed: Medical issues which prohibited therapy - pt with focal seizures.  Will check back  Darlina Rumpf Pella, OTR/L [phone]  11/15/2014, 11:15 AM

## 2014-11-15 NOTE — Progress Notes (Signed)
Chaplain referred to pt and family from pt nurse. Pt daughter teary at bedside. Pt daughter described the difficulty of seeing her father this way, when he is normally independent and upbeat. When asked, pt dt reported that she is not afraid that he will not recover. Pt daughter appreciated visit. Chaplain will continue to follow. Page chaplain if needed before follow up.   11/15/14 1100  Clinical Encounter Type  Visited With Family  Visit Type Follow-up;Spiritual support  Referral From Nurse  Spiritual Encounters  Spiritual Needs Emotional  Stress Factors  Family Stress Factors Health changes  Kerim Statzer, Barbette Hair, Chaplain 11/15/2014 11:23 AM

## 2014-11-15 NOTE — Progress Notes (Signed)
Stable. One gram of iv dilantin given to stop focal facial seizures. Craniotomy defect pulsatil   Duran Ohern M 11/15/2014, 5:33 PM

## 2014-11-15 NOTE — Progress Notes (Signed)
CT head completed, Labs drawn, and medicines given per order. [place] notified. Expressed concerns for given oral dilantin d/t frequent seizures. Discussed labs and pt's conditions. Orders received. Will call MD back with update after giving meds

## 2014-11-15 NOTE — Progress Notes (Signed)
Patient ID: Martin Edwards, male   DOB: [birth year], 69 y.o.   MRN: [id number] On keppra ,having facial seizures. Wound dry pulsatil, spoke with daughter . Will get rehab to see

## 2014-11-15 NOTE — Progress Notes (Signed)
In assessing pt with off going nurse. Pt has facial/tongue twitching, reoccurring focal seizure lasting ~45seconds. After seizure pt A/O and following commands. Continue assessing pt ( 5-10 minutues) and begins to have another seizure involving facial/tongue twitching and left arm twitching. Event last ~30 seconds. After, pt responds to questions appropriately.  Dr. Christella Noa notified, order to consult neurology.  ~1950: Dr. Leonel Ramsay made aware. MD on floor assessing pt. Noted to have similar focal seizures x2. Ordered to give 1mg  Ativan STAT. Daughter in room during events and discussion.

## 2014-11-15 NOTE — Clinical Social Work Note (Signed)
CSW has left report for weekend CSW. CSW will be available to assist with SNF placement if inpatient rehab is not a viable option for the patient.   Liz Beach MSW, [place], [place], [phone]

## 2014-11-15 NOTE — Progress Notes (Signed)
Focal seizures of left face occuring multiple times throughout the day, each lasting 30 seconds to 1 minute.  Patient arouses easily following each seizure.  MD updated several times throughout the day regarding seizures.  MD at bedside multiple times throughout the day.  Family at bedside and updated.  Will continue to monitor.

## 2014-11-15 NOTE — Progress Notes (Signed)
Rehab admissions - I am following pt's case and noted latest note per Dr. Joya Salm. Per MD, "On keppra ,having facial seizures."  We will continue to follow his progress and will need updated therapy notes from this weekend to submit to [facility] on Monday. A new case will need to be opened with [facility]. Family is aware that we are following pt's status.  My partner, Lysbeth Penner, will be picking up his case for next week. She can be reached at cell [phone].   Thanks.  Nanetta Batty, PT Rehabilitation Admissions Coordinator [phone]'

## 2014-11-15 NOTE — Progress Notes (Signed)
Physical Therapy Treatment Patient Details Name: Martin Edwards MRN: [id number] DOB: 1946/02/22 Today's Date: 11/15/2014    History of Present Illness Patient is a 69 year old male with a history of hypertension, hyperlipidemia, prostate cancer, and small bowel obstruction who presents to the emergency department for further evaluation of headache. Patient reports a constant, dull frontal headache 2 weeks. He had acute worsening of his headache 1 week ago for 24 hours and began to notice dizziness approximately 2 days later. Patient describes the dizziness as a sense of being off balance. He has experienced generalized weakness and unsteady gait 3 days. Gait difficulty has caused patient to fall a few times.  Pt s/p frontotemporoparietal  craniotomy. Back to sx 6/1 for craniectomy.  Flap in R stomach pouch.    PT Comments    Progressing slowly.  Emphasis on determining pt's level of inattention and increasing awareness and doing tasks to the L.  Follow Up Recommendations  CIR     Equipment Recommendations  Other (comment)    Recommendations for Other Services Rehab consult     Precautions / Restrictions Precautions Precautions: Fall Precaution Comments: L inattension continues    Mobility  Bed Mobility Overal bed mobility: Needs Assistance Bed Mobility: Supine to Sit     Supine to sit: Mod assist     General bed mobility comments: cuing more that assist to get pt to EOB. pt tending to initiate, move then stops before the goal needing more cuing .  Transfers Overall transfer level: Needs assistance   Transfers: Sit to/from Stand Sit to Stand: Min assist;Mod assist;+2 physical assistance         General transfer comment: control of L knee/LE and assist for w/shift R.  Ambulation/Gait             General Gait Details: unable today.   Stairs            Wheelchair Mobility    Modified Rankin (Stroke Patients Only) Modified Rankin (Stroke Patients  Only) Pre-Morbid Rankin Score: No symptoms Modified Rankin: Severe disability     Balance Overall balance assessment: Needs assistance Sitting-balance support: No upper extremity supported;Feet supported Sitting balance-Leahy Scale: Poor Sitting balance - Comments: listing L and posteriorly into post tilt with head down.  Pt able to be cued to sit more upright with head up, but unable to hold without repetitive cuing.     Standing balance-Leahy Scale: Poor Standing balance comment: stood for 1-2 min with graded assist/support of L knee, trunk.  Working on pt attaining upright midline balance, but pt did need w/shift assist and trucal assist to facilitate coming forward.                    Cognition Arousal/Alertness: Awake/alert;Lethargic Behavior During Therapy: Flat affect Overall Cognitive Status: Impaired/Different from baseline Area of Impairment: Attention;Orientation;Following commands;Awareness;Problem solving Orientation Level: Time Current Attention Level: Sustained Memory: Decreased short-term memory Following Commands: Follows one step commands inconsistently;Follows one step commands with increased time Safety/Judgement: Decreased awareness of deficits;Decreased awareness of safety Awareness: Intellectual Problem Solving: Slow processing;Decreased initiation;Difficulty sequencing;Requires verbal cues;Requires tactile cues General Comments: pt's attention can be drawn to the L with repetitive cues, improved overperformance prior to 2nd surgery    Exercises      General Comments General comments (skin integrity, edema, etc.): Work in sitting drawing focus to objects on the left, clock, calendar      Pertinent Vitals/Pain Pain Assessment: Faces Faces Pain Scale: No hurt  Home Living                      Prior Function            PT Goals (current goals can now be found in the care plan section) Acute Rehab PT Goals PT Goal Formulation: With  patient Time For Goal Achievement: 11/26/14 Potential to Achieve Goals: Good Progress towards PT goals: Progressing toward goals    Frequency  Min 4X/week    PT Plan Current plan remains appropriate    Co-evaluation             End of Session   Activity Tolerance: Patient tolerated treatment well;Patient limited by fatigue Patient left:  (sitting EOB with nursing shaving his head.)     Time: 6440-3474 PT Time Calculation (min) (ACUTE ONLY): 54 min  Charges:  $Therapeutic Activity: 23-37 mins                    G Codes:      Ashmi Blas, Tessie Fass 11/15/2014, 4:43 PM 11/15/2014  Donnella Sham, [place] [phone]  (pager)

## 2014-11-15 NOTE — Progress Notes (Signed)
1025 Patient with left facial twitching x 2, each lasting 1 minute.  On call MD notified 1032 Keppra 500mg  given, new order for keppra 750mg  ordered 1050 Patient continues to have left facial seizures intermittently, MD at bedside. Ativan 1mg  given

## 2014-11-16 ENCOUNTER — Inpatient Hospital Stay (HOSPITAL_COMMUNITY): Payer: Medicare Other

## 2014-11-16 LAB — URINALYSIS, ROUTINE W REFLEX MICROSCOPIC
Bilirubin Urine: NEGATIVE
Glucose, UA: NEGATIVE mg/dL
HGB URINE DIPSTICK: NEGATIVE
Ketones, ur: NEGATIVE mg/dL
LEUKOCYTES UA: NEGATIVE
NITRITE: NEGATIVE
PH: 6.5 (ref 5.0–8.0)
Protein, ur: NEGATIVE mg/dL
Specific Gravity, Urine: 1.004 — ABNORMAL LOW (ref 1.005–1.030)
Urobilinogen, UA: 0.2 mg/dL (ref 0.0–1.0)

## 2014-11-16 LAB — URINE CULTURE

## 2014-11-16 LAB — BASIC METABOLIC PANEL
Anion gap: 10 (ref 5–15)
BUN: 10 mg/dL (ref 6–20)
CALCIUM: 8.1 mg/dL — AB (ref 8.9–10.3)
CO2: 23 mmol/L (ref 22–32)
CREATININE: 0.72 mg/dL (ref 0.61–1.24)
Chloride: 100 mmol/L — ABNORMAL LOW (ref 101–111)
GFR calc Af Amer: >60 mL/min (ref >60)
GFR calc non Af Amer: >60 mL/min (ref >60)
GLUCOSE: 127 mg/dL — AB (ref 65–99)
Potassium: 3.3 mmol/L — ABNORMAL LOW (ref 3.5–5.1)
Sodium: 133 mmol/L — ABNORMAL LOW (ref 135–145)

## 2014-11-16 LAB — MAGNESIUM: MAGNESIUM: 1.9 mg/dL (ref 1.7–2.4)

## 2014-11-16 MED ORDER — SENNA 8.6 MG PO TABS
1.0000 | ORAL_TABLET | Freq: Two times a day (BID) | ORAL | Status: DC
  Administered 2014-11-16 – 2014-11-18 (×4): 8.6 mg via ORAL
  Filled 2014-11-16 (×6): qty 1

## 2014-11-16 MED ORDER — DIPHENHYDRAMINE HCL 50 MG PO CAPS
50.0000 mg | ORAL_CAPSULE | Freq: Once | ORAL | Status: AC
  Administered 2014-11-16: 50 mg via ORAL
  Filled 2014-11-16: qty 1
  Filled 2014-11-16: qty 2

## 2014-11-16 NOTE — Progress Notes (Signed)
Called and updated Dr. Leonel Ramsay on condition of pt. Discussed pt's assessment, medications, CT results, and no further visual seizures noted. Will continue to monitor closely.

## 2014-11-16 NOTE — Progress Notes (Signed)
EEG Completed; Results Pending  

## 2014-11-16 NOTE — Progress Notes (Signed)
Occupational Therapy Treatment Note  Pt with focal seizures and given sedating meds causing fluctuations with arousal.  Pt was able to attend to Lt with min - mod verbal cues.  Demonstrates improving trunk control and balance.  Needs continued OT to facilitate increased independence with ADLs.   Recommend CIR    11/15/14 1616  OT Visit Information  Last OT Received On 11/16/14  Assistance Needed +2  PT/OT/SLP Co-Evaluation/Treatment Yes  Reason for Co-Treatment Complexity of the patient's impairments (multi-system involvement)  OT goals addressed during session ADL's and self-care  History of Present Illness Patient is a 69 year old male with a history of hypertension, hyperlipidemia, prostate cancer, and small bowel obstruction who presents to the emergency department for further evaluation of headache. Patient reports a constant, dull frontal headache 2 weeks. He had acute worsening of his headache 1 week ago for 24 hours and began to notice dizziness approximately 2 days later. Patient describes the dizziness as a sense of being off balance. He has experienced generalized weakness and unsteady gait 3 days. Gait difficulty has caused patient to fall a few times.  Pt s/p frontotemporoparietal  craniotomy. Back to sx 6/1 for craniectomy.  Flap in R stomach pouch.  OT Time Calculation  OT Start Time (ACUTE ONLY) 1438  OT Stop Time (ACUTE ONLY) 1532  OT Time Calculation (min) 54 min  Precautions  Precautions Fall  Precaution Comments L inattension continues  Pain Assessment  Pain Assessment Faces  Faces Pain Scale 0  Cognition  Arousal/Alertness Awake/alert;Lethargic  Behavior During Therapy Flat affect  Overall Cognitive Status Impaired/Different from baseline  Area of Impairment Attention;Orientation;Following commands;Awareness;Problem solving  Orientation Level Time  Current Attention Level Sustained  Memory Decreased short-term memory  Following Commands Follows one step  commands inconsistently;Follows one step commands with increased time  Safety/Judgement Decreased awareness of deficits;Decreased awareness of safety  Awareness Intellectual  Problem Solving Slow processing;Decreased initiation;Difficulty sequencing;Requires verbal cues;Requires tactile cues  General Comments Pt fluctuated between wakefulness and lethargy.  Slow to respond at times.  Lt inattention, but able to attend to Lt with min verbal cues.  Pt asking same questions repetitively at times   ADL  Overall ADL's  Needs assistance/impaired  Grooming Wash/dry hands;Wash/dry face;Moderate assistance;Sitting  Grooming Details (indicate cue type and reason) mod verbla cues   Toilet Transfer Moderate assistance;+2 for physical assistance;Stand-pivot;BSC  Toileting- Clothing Manipulation and Hygiene Total assistance;Sit to/from stand  Functional mobility during ADLs Moderate assistance;+2 for physical assistance  General ADL Comments Pt with focal seizures throughout the day.  Pt loaded on Keppra and Ativan per RN.  Family present and instructed in activites to drive attention to the Lt   Bed Mobility  Overal bed mobility Needs Assistance  Bed Mobility Supine to Sit  Supine to sit Mod assist  General bed mobility comments cuing more that assist to get pt to EOB. pt tending to initiate, move then stops before the goal needing more cuing .  Balance  Overall balance assessment Needs assistance  Sitting-balance support Single extremity supported  Sitting balance-Leahy Scale Poor  Sitting balance - Comments listing L and posteriorly into post tilt with head down.  Pt able to be cued to sit more upright with head up, but unable to hold without repetitive cuing.  Standing balance-Leahy Scale Poor  Standing balance comment stood for 1-2 min with graded assist/support of L knee, trunk.  Working on pt attaining upright midline balance, but pt did need w/shift assist and trucal assist to facilitate  coming  forward.  Vision- Assessment  Additional Comments Pt with Lt inattention.  he will however attend to therapist on left.  He required max cues to locate clock on wall.  He was unable to read it accurately   Transfers  Overall transfer level Needs assistance  Transfers Sit to/from Stand  Sit to Stand Min assist;Mod assist;+2 physical assistance  General transfer comment control of L knee/LE and assist for w/shift R.  OT - End of Session  Activity Tolerance Patient tolerated treatment well  Patient left in bed;with call bell/phone within reach;with family/visitor present;with nursing/sitter in room  Nurse Communication Mobility status  OT Assessment/Plan  OT Plan Discharge plan remains appropriate  OT Frequency (ACUTE ONLY) Min 2X/week  Recommendations for Other Services Rehab consult  Follow Up Recommendations CIR  OT Equipment None recommended by OT  OT Goal Progression  Progress towards OT goals Progressing toward goals  ADL Goals  Pt Will Perform Eating with mod assist;sitting  Pt Will Perform Grooming with min assist;sitting  Pt/caregiver will Perform Home Exercise Program Left upper extremity;With written HEP provided;With minimal assist  Additional ADL Goal #1 Pt will be able to locate items on L side with min A during grooming activity   [facility], OTR/L [phone]

## 2014-11-16 NOTE — Progress Notes (Signed)
Pt seen and examined. No issues overnight. Pt feels well, no significant c/o. Per RN, pt has not had any more focal SZ this morning.  EXAM: Temp:  [99 F (37.2 C)-102.6 F (39.2 C)] 99.7 F (37.6 C) (06/04 1000) Pulse Rate:  [55-92] 85 (06/04 1000) Resp:  [13-22] 17 (06/04 1000) BP: (89-147)/(43-68) 130/60 mmHg (06/04 1000) SpO2:  [93 %-100 %] 96 % (06/04 1000) Intake/Output      06/03 0701 - 06/04 0700 06/04 0701 - 06/05 0700   I.V. (mL/kg) 1725 (21.7) 225 (2.8)   IV Piggyback 1017 110   Total Intake(mL/kg) 2742 (34.4) 335 (4.2)   Urine (mL/kg/hr) 1575 (0.8) 410 (1.5)   Total Output 1575 410   Net +1167 -75         Awake, alert, oriented Speech fluent Moving all extremities well No SZ observed  LABS: Lab Results  Component Value Date   CREATININE 0.72 11/16/2014   BUN 10 11/16/2014   NA 133* 11/16/2014   K 3.3* 11/16/2014   CL 100* 11/16/2014   CO2 23 11/16/2014   Lab Results  Component Value Date   WBC 8.8 11/11/2014   HGB 14.1 11/11/2014   HCT 41.6 11/11/2014   MCV 91.8 11/11/2014   PLT 172 11/11/2014    IMPRESSION: - 69 y.o. male s/p SDH evacuation with persistent focal motor SZ postop. LVT and PHT dosages have been increased, with apparent good response so far.  PLAN: - f/u EEG - Cont to observe in ICU

## 2014-11-16 NOTE — Progress Notes (Signed)
Subjective: The patient reports no further seizure activity. Patient's nurse confirms this. The patient asked out his EEG and was told that the reading is pending. He is mildly lethargic but oriented in April follow commands.  Objective: Current vital signs: BP 148/60 mmHg  Pulse 93  Temp(Src) 100.6 F (38.1 C) (Oral)  Resp 15  Ht 5\' 9"  (1.753 m)  Wt 79.6 kg (175 lb 7.8 oz)  BMI 25.90 kg/m2  SpO2 98% Vital signs in last 24 hours: Temp:  [99 F (37.2 C)-102.6 F (39.2 C)] 100.6 F (38.1 C) (06/04 1400) Pulse Rate:  [55-93] 93 (06/04 1400) Resp:  [13-22] 15 (06/04 1400) BP: (89-148)/(43-68) 148/60 mmHg (06/04 1400) SpO2:  [89 %-100 %] 98 % (06/04 1400)  Intake/Output from previous day: 06/03 0701 - 06/04 0700 In: 2742 [I.V.:1725; IV Piggyback:1017] Out: 1575 [Urine:1575] Intake/Output this shift: Total I/O In: 1185 [P.O.:550; I.V.:525; IV Piggyback:110] Out: 1560 [Urine:1560] Nutritional status: DIET - DYS 1 Room service appropriate?: Yes; Fluid consistency:: Thin  Physical Exam  General - pleasant 69 year old male status post removal of bone flap right frontal temporal parietal area. Heart - Regular rate and rhythm - no murmer Lungs - Clear to auscultation Abdomen - Soft - non tender Skin - Warm and dry  Neurologic Exam:  MENTAL STATUS: awake, speaks and moves very slowly, Language fluent Follows simple commands. CRANIAL NERVES: pupils equal and reactive to light, extraocular muscles intact, no nystagmus, facial sensation symmetric, slight left facial droop. MOTOR: normal bulk and tone, full strength in the RUE. Left upper and lower extremities 3 over 5.   SENSORY: Sensation to light touch slightly decreased on the left. COORDINATION: finger-nose-finger slow but intact bilaterally. RAMs performed without difficulty RUE. Problems with LUE.     Lab Results: Basic Metabolic Panel:  Recent Labs Lab 11/11/14 0358 11/15/14 2106 11/16/14 0901  NA 136 129* 133*  K  4.0 3.3* 3.3*  CL 104 97* 100*  CO2 23 24 23   GLUCOSE 154* 133* 127*  BUN 14 11 10   CREATININE 0.99 0.81 0.72  CALCIUM 8.6* 7.8* 8.1*  MG  --  1.9  --     Liver Function Tests:  Recent Labs Lab 11/11/14 0358  AST 22  ALT 21  ALKPHOS 58  BILITOT 0.8  PROT 7.3  ALBUMIN 3.8   No results for input(s): LIPASE, AMYLASE in the last 168 hours. No results for input(s): AMMONIA in the last 168 hours.  CBC:  Recent Labs Lab 11/11/14 0358  WBC 8.8  NEUTROABS 7.4  HGB 14.1  HCT 41.6  MCV 91.8  PLT 172    Cardiac Enzymes: No results for input(s): CKTOTAL, CKMB, CKMBINDEX, TROPONINI in the last 168 hours.  Lipid Panel: No results for input(s): CHOL, TRIG, HDL, CHOLHDL, VLDL, LDLCALC in the last 168 hours.  CBG: No results for input(s): GLUCAP in the last 168 hours.  Microbiology: Results for orders placed or performed during the hospital encounter of 11/11/14  MRSA PCR Screening     Status: None   Collection Time: 11/11/14  8:40 AM  Result Value Ref Range Status   MRSA by PCR NEGATIVE NEGATIVE Final    Comment:        The GeneXpert MRSA Assay (FDA approved for NASAL specimens only), is one component of a comprehensive MRSA colonization surveillance program. It is not intended to diagnose MRSA infection nor to guide or monitor treatment for MRSA infections.   Culture, blood (routine x 2)     Status:  None (Preliminary result)   Collection Time: 11/14/14  1:40 PM  Result Value Ref Range Status   Specimen Description BLOOD RIGHT HAND  Final   Special Requests BOTTLES DRAWN AEROBIC ONLY 3CC  Final   Culture   Final           BLOOD CULTURE RECEIVED NO GROWTH TO DATE CULTURE WILL BE HELD FOR 5 DAYS BEFORE ISSUING A FINAL NEGATIVE REPORT Performed at [facility]    Report Status PENDING  Incomplete  Culture, blood (routine x 2)     Status: None (Preliminary result)   Collection Time: 11/14/14  1:50 PM  Result Value Ref Range Status   Specimen Description  BLOOD BLOOD RIGHT FOREARM  Final   Special Requests BOTTLES DRAWN AEROBIC ONLY [place]  Final   Culture   Final           BLOOD CULTURE RECEIVED NO GROWTH TO DATE CULTURE WILL BE HELD FOR 5 DAYS BEFORE ISSUING A FINAL NEGATIVE REPORT Performed at [facility]    Report Status PENDING  Incomplete  Culture, Urine     Status: None (Preliminary result)   Collection Time: 11/14/14  4:34 PM  Result Value Ref Range Status   Specimen Description URINE, RANDOM  Final   Special Requests NONE  Final   Colony Count   Final    >=100,000 COLONIES/ML Performed at [facility]    Culture   Final    STAPHYLOCOCCUS SPECIES (COAGULASE NEGATIVE) Note: RIFAMPIN AND GENTAMICIN SHOULD NOT BE USED AS SINGLE DRUGS FOR TREATMENT OF STAPH INFECTIONS. Performed at [facility]    Report Status PENDING  Incomplete    Coagulation Studies: No results for input(s): LABPROT, INR in the last 72 hours.  Imaging:   Ct Head Wo Contrast 11/16/2014    1. Postoperative changes from interval removal of right-sided craniectomy bone flap for evacuation of recurrent right subdural hematoma. No complication.  2. Slight interval decrease in size of acute on chronic right subdural collection now measuring up to 14 mm in maximal thickness.  3. Persistent 9 mm of right-to-left midline shift with partial effacement of the right lateral ventricle. No hydrocephalus. Basilar cisterns are patent.      Medications:  Scheduled: . sodium chloride   Intravenous Once  . amLODipine  10 mg Oral Daily  . atorvastatin  40 mg Oral q1800  . cefTRIAXone (ROCEPHIN)  IV  2 g Intravenous Q24H  . hydrochlorothiazide  12.5 mg Oral Daily  . levETIRAcetam  1,000 mg Intravenous Q12H  . lisinopril  40 mg Oral Daily  . pantoprazole  40 mg Oral QHS  . phenytoin (DILANTIN) IV  100 mg Intravenous 3 times per day  . senna  1 tablet Oral BID    Assessment/Plan:  Doing well post evacuation of subdural hematoma complicated by  seizure activity - now controlled on Keppra and Dilantin. EEG pending. Patient anxious for transfer off the unit. No changes in current management recommended.  Mikey Bussing PA-C [facility] Pager [phone] 11/16/2014, 3:00 PM  I personally participated in this patient's evaluation and management, including formulating above clinical impression and management recommendations.  Rush Farmer M.D. [facility] [phone]

## 2014-11-16 NOTE — Procedures (Signed)
ELECTROENCEPHALOGRAM REPORT  Patient: Martin Edwards       Room #: 7O16 EEG No. ID: [id number] Age: 69 y.o.        Sex: male Referring Physician: Orson Eva Report Date:  11/16/2014        Interpreting Physician: Anthony Sar  History: Martin Edwards is an 69 y.o. male status post right craniectomy for evacuation of recurrent subdural hematoma, and subsequent status epilepticus.  Indications for study:  Rule out ongoing seizure activity.  Technique: This is an 18 channel routine scalp EEG performed at the bedside with bipolar and monopolar montages arranged in accordance to the international 10/20 system of electrode placement.   Description: This EEG recording was performed during wakefulness. Patient was noted to be only minimally confused at the time of this study. The dominant background activity consisted of low amplitude diffuse mixed irregular delta and theta activity was slightly slower the right hemisphere compared to left. As well, there were periodic moderate amplitude sharp equation recorded from the right hemisphere, consistent with periodic lateralizing epileptiform discharges (PLEDs). Photic stimulation and hyperventilation were not performed. No frank epileptiform discharges were recorded.  Interpretation: This EEG recording was abnormal generalized continuous slowing of cerebral activity which was more pronounced on the side. PLEDs are nonspecific and can be seen with a wide variety of hemispheric encephalopathies, although most commonly seen with strokes. No evidence of ongoing epileptic activity was recorded.   Rush Farmer M.D. [facility] [phone]

## 2014-11-17 MED ORDER — BISACODYL 10 MG RE SUPP
10.0000 mg | Freq: Once | RECTAL | Status: AC
  Administered 2014-11-17: 10 mg via RECTAL
  Filled 2014-11-17: qty 1

## 2014-11-17 NOTE — Progress Notes (Signed)
Pt seen and examined. No issues overnight. Pt has no complaints. No focal SZ noted.  EXAM: Temp:  [99.7 F (37.6 C)-100.8 F (38.2 C)] 100 F (37.8 C) (06/05 0700) Pulse Rate:  [69-93] 77 (06/05 0700) Resp:  [12-20] 15 (06/05 0700) BP: (104-153)/(50-74) 144/74 mmHg (06/05 0700) SpO2:  [89 %-100 %] 95 % (06/05 0700) Intake/Output      06/04 0701 - 06/05 0700 06/05 0701 - 06/06 0700   P.O. 1750    I.V. (mL/kg) 1800 (22.6)    IV Piggyback 270    Total Intake(mL/kg) 3820 (48)    Urine (mL/kg/hr) 3385 (1.8) 600 (2.4)   Total Output 3385 600   Net +435 -600         Awake, alert, oriented Speech fluent CN intact Good strength throughout Wound c/d/i  LABS: Lab Results  Component Value Date   CREATININE 0.72 11/16/2014   BUN 10 11/16/2014   NA 133* 11/16/2014   K 3.3* 11/16/2014   CL 100* 11/16/2014   CO2 23 11/16/2014   Lab Results  Component Value Date   WBC 8.8 11/11/2014   HGB 14.1 11/11/2014   HCT 41.6 11/11/2014   MCV 91.8 11/11/2014   PLT 172 11/11/2014    IMPRESSION: - 69 y.o. male s/p SDH evac, postop focal SZ controlled on LVT, PHT without episode >24 hrs - Rash on back may be contact dermatitis  PLAN: - Cont to monitor in ICU, if stable will transfer tomorrow - Mobilize with RN as tolerated - Benadryl PRN -

## 2014-11-17 NOTE — Progress Notes (Signed)
Subjective: No recurrent seizures reported. Mental status has continued to improve. No overnight adverse events reported. Patient had no new complaints.  Objective: Current vital signs: BP 144/74 mmHg  Pulse 77  Temp(Src) 100 F (37.8 C) (Oral)  Resp 15  Ht 5\' 9"  (1.753 m)  Wt 79.6 kg (175 lb 7.8 oz)  BMI 25.90 kg/m2  SpO2 95%  Neurologic Exam: Patient was alert and in no acute distress. He was well-oriented to time as well as place. Thought content was appropriate. Extraocular movements were full and conjugate. Visual fields were normal. Mild left lower facial weakness was noted. Speech was normal. There was slight drift of left upper extremity. Motor exam is otherwise unremarkable.  EEG on 11/16/2014 showed generalized slowing of cerebral activity which was more pronounced on the right side. No seizure activity was noted.  Medications: I have reviewed the patient's current medications.  Assessment/Plan: 69 year old man status post right craniotomy for evacuation of recurrent subdural hematoma, and subsequent partial status epilepticus. Patient is doing well at this point on Keppra 1000 mg every 12 hours.  Recommend no changes in current seizure management. No further neurological intervention is indicated acutely. We will plan to see him in follow-up on an as-needed basis.  C.R. Nicole Kindred, MD [facility] [phone]  11/17/2014  9:25 AM

## 2014-11-18 ENCOUNTER — Inpatient Hospital Stay (HOSPITAL_COMMUNITY)
Admission: RE | Admit: 2014-11-18 | Discharge: 2014-11-28 | DRG: 949 | Disposition: A | Discharge status: Home / Self Care | Payer: Medicare Other | Source: Intra-hospital | Attending: Physical Medicine & Rehabilitation | Admitting: Physical Medicine & Rehabilitation

## 2014-11-18 DIAGNOSIS — S065X4D Traumatic subdural hemorrhage with loss of consciousness of 6 hours to 24 hours, subsequent encounter: Principal | ICD-10-CM

## 2014-11-18 DIAGNOSIS — R569 Unspecified convulsions: Secondary | ICD-10-CM

## 2014-11-18 DIAGNOSIS — B958 Unspecified staphylococcus as the cause of diseases classified elsewhere: Secondary | ICD-10-CM | POA: Diagnosis not present

## 2014-11-18 DIAGNOSIS — K5900 Constipation, unspecified: Secondary | ICD-10-CM | POA: Diagnosis not present

## 2014-11-18 DIAGNOSIS — R4189 Other symptoms and signs involving cognitive functions and awareness: Secondary | ICD-10-CM

## 2014-11-18 DIAGNOSIS — S065X4A Traumatic subdural hemorrhage with loss of consciousness of 6 hours to 24 hours, initial encounter: Secondary | ICD-10-CM | POA: Diagnosis not present

## 2014-11-18 DIAGNOSIS — E785 Hyperlipidemia, unspecified: Secondary | ICD-10-CM | POA: Diagnosis not present

## 2014-11-18 DIAGNOSIS — R6889 Other general symptoms and signs: Secondary | ICD-10-CM | POA: Diagnosis not present

## 2014-11-18 DIAGNOSIS — G3184 Mild cognitive impairment, so stated: Secondary | ICD-10-CM | POA: Diagnosis not present

## 2014-11-18 DIAGNOSIS — Z8546 Personal history of malignant neoplasm of prostate: Secondary | ICD-10-CM | POA: Diagnosis not present

## 2014-11-18 DIAGNOSIS — E876 Hypokalemia: Secondary | ICD-10-CM | POA: Diagnosis not present

## 2014-11-18 DIAGNOSIS — E871 Hypo-osmolality and hyponatremia: Secondary | ICD-10-CM | POA: Diagnosis not present

## 2014-11-18 DIAGNOSIS — A499 Bacterial infection, unspecified: Secondary | ICD-10-CM | POA: Diagnosis present

## 2014-11-18 DIAGNOSIS — R945 Abnormal results of liver function studies: Secondary | ICD-10-CM | POA: Diagnosis present

## 2014-11-18 DIAGNOSIS — R269 Unspecified abnormalities of gait and mobility: Secondary | ICD-10-CM

## 2014-11-18 DIAGNOSIS — I10 Essential (primary) hypertension: Secondary | ICD-10-CM | POA: Diagnosis not present

## 2014-11-18 DIAGNOSIS — Z789 Other specified health status: Secondary | ICD-10-CM | POA: Diagnosis not present

## 2014-11-18 DIAGNOSIS — Z79899 Other long term (current) drug therapy: Secondary | ICD-10-CM

## 2014-11-18 DIAGNOSIS — R7989 Other specified abnormal findings of blood chemistry: Secondary | ICD-10-CM | POA: Diagnosis present

## 2014-11-18 DIAGNOSIS — R449 Unspecified symptoms and signs involving general sensations and perceptions: Secondary | ICD-10-CM | POA: Diagnosis present

## 2014-11-18 DIAGNOSIS — I6200 Nontraumatic subdural hemorrhage, unspecified: Secondary | ICD-10-CM

## 2014-11-18 DIAGNOSIS — R29898 Other symptoms and signs involving the musculoskeletal system: Secondary | ICD-10-CM | POA: Diagnosis present

## 2014-11-18 DIAGNOSIS — Z85828 Personal history of other malignant neoplasm of skin: Secondary | ICD-10-CM | POA: Diagnosis not present

## 2014-11-18 DIAGNOSIS — W182XXD Fall in (into) shower or empty bathtub, subsequent encounter: Secondary | ICD-10-CM

## 2014-11-18 DIAGNOSIS — S065X4S Traumatic subdural hemorrhage with loss of consciousness of 6 hours to 24 hours, sequela: Secondary | ICD-10-CM | POA: Diagnosis not present

## 2014-11-18 DIAGNOSIS — N390 Urinary tract infection, site not specified: Secondary | ICD-10-CM

## 2014-11-18 DIAGNOSIS — R29818 Other symptoms and signs involving the nervous system: Secondary | ICD-10-CM | POA: Diagnosis present

## 2014-11-18 LAB — URINALYSIS, ROUTINE W REFLEX MICROSCOPIC
Bilirubin Urine: NEGATIVE
GLUCOSE, UA: NEGATIVE mg/dL
Ketones, ur: NEGATIVE mg/dL
Leukocytes, UA: NEGATIVE
Nitrite: NEGATIVE
Protein, ur: 30 mg/dL — AB
SPECIFIC GRAVITY, URINE: 1.009 (ref 1.005–1.030)
Urobilinogen, UA: 0.2 mg/dL (ref 0.0–1.0)
pH: 7 (ref 5.0–8.0)

## 2014-11-18 LAB — URINE MICROSCOPIC-ADD ON

## 2014-11-18 MED ORDER — LEVOFLOXACIN 250 MG PO TABS
250.0000 mg | ORAL_TABLET | Freq: Every day | ORAL | Status: AC
  Administered 2014-11-18 – 2014-11-22 (×5): 250 mg via ORAL
  Filled 2014-11-18 (×5): qty 1

## 2014-11-18 MED ORDER — HYDROCORTISONE 1 % EX CREA
TOPICAL_CREAM | Freq: Two times a day (BID) | CUTANEOUS | Status: DC
  Administered 2014-11-18 – 2014-11-19 (×2): via TOPICAL
  Filled 2014-11-18: qty 28

## 2014-11-18 MED ORDER — ALUM & MAG HYDROXIDE-SIMETH 200-200-20 MG/5ML PO SUSP
30.0000 mL | ORAL | Status: DC | PRN
Start: 2014-11-18 — End: 2014-11-28

## 2014-11-18 MED ORDER — PHENYTOIN 50 MG PO CHEW
100.0000 mg | CHEWABLE_TABLET | Freq: Three times a day (TID) | ORAL | Status: DC
  Administered 2014-11-18 – 2014-11-28 (×29): 100 mg via ORAL
  Filled 2014-11-18 (×32): qty 2

## 2014-11-18 MED ORDER — HYDROCODONE-ACETAMINOPHEN 5-325 MG PO TABS
1.0000 | ORAL_TABLET | ORAL | Status: DC | PRN
  Administered 2014-11-19 (×2): 2 via ORAL
  Filled 2014-11-18 (×2): qty 2

## 2014-11-18 MED ORDER — SENNOSIDES-DOCUSATE SODIUM 8.6-50 MG PO TABS
2.0000 | ORAL_TABLET | Freq: Every day | ORAL | Status: DC
  Administered 2014-11-18 – 2014-11-27 (×10): 2 via ORAL
  Filled 2014-11-18 (×10): qty 2

## 2014-11-18 MED ORDER — GUAIFENESIN-DM 100-10 MG/5ML PO SYRP: 5.0000 mL | ORAL_SOLUTION | Freq: Four times a day (QID) | ORAL | Status: DC | PRN

## 2014-11-18 MED ORDER — LEVETIRACETAM 500 MG PO TABS
1000.0000 mg | ORAL_TABLET | Freq: Two times a day (BID) | ORAL | Status: DC
  Administered 2014-11-18 – 2014-11-28 (×20): 1000 mg via ORAL
  Filled 2014-11-18 (×26): qty 2

## 2014-11-18 MED ORDER — BISACODYL 10 MG RE SUPP: 10.0000 mg | Freq: Every day | RECTAL | Status: DC | PRN

## 2014-11-18 MED ORDER — POTASSIUM CHLORIDE CRYS ER 20 MEQ PO TBCR
20.0000 meq | EXTENDED_RELEASE_TABLET | Freq: Two times a day (BID) | ORAL | Status: DC
  Administered 2014-11-18 – 2014-11-19 (×2): 20 meq via ORAL
  Filled 2014-11-18 (×4): qty 1

## 2014-11-18 MED ORDER — ONDANSETRON HCL 4 MG/2ML IJ SOLN: 4.0000 mg | INTRAMUSCULAR | Status: DC | PRN

## 2014-11-18 MED ORDER — HYDROCHLOROTHIAZIDE 12.5 MG PO CAPS
12.5000 mg | ORAL_CAPSULE | Freq: Every day | ORAL | Status: DC
  Administered 2014-11-19: 12.5 mg via ORAL
  Filled 2014-11-18 (×2): qty 1

## 2014-11-18 MED ORDER — ONDANSETRON HCL 4 MG PO TABS: 4.0000 mg | ORAL_TABLET | ORAL | Status: DC | PRN

## 2014-11-18 MED ORDER — FLEET ENEMA 7-19 GM/118ML RE ENEM: 1.0000 | ENEMA | Freq: Once | RECTAL | Status: AC | PRN

## 2014-11-18 MED ORDER — LISINOPRIL 40 MG PO TABS
40.0000 mg | ORAL_TABLET | Freq: Every day | ORAL | Status: DC
  Administered 2014-11-19 – 2014-11-28 (×10): 40 mg via ORAL
  Filled 2014-11-18 (×11): qty 1

## 2014-11-18 MED ORDER — AMLODIPINE BESYLATE 10 MG PO TABS
10.0000 mg | ORAL_TABLET | Freq: Every day | ORAL | Status: DC
  Administered 2014-11-19 – 2014-11-28 (×10): 10 mg via ORAL
  Filled 2014-11-18 (×11): qty 1

## 2014-11-18 MED ORDER — ACETAMINOPHEN 325 MG PO TABS: 325.0000 mg | ORAL_TABLET | ORAL | Status: DC | PRN

## 2014-11-18 MED ORDER — PANTOPRAZOLE SODIUM 40 MG PO TBEC
40.0000 mg | DELAYED_RELEASE_TABLET | Freq: Every day | ORAL | Status: DC
  Administered 2014-11-18 – 2014-11-27 (×10): 40 mg via ORAL
  Filled 2014-11-18 (×10): qty 1

## 2014-11-18 MED ORDER — ATORVASTATIN CALCIUM 40 MG PO TABS
40.0000 mg | ORAL_TABLET | Freq: Every day | ORAL | Status: DC
  Administered 2014-11-18 – 2014-11-27 (×10): 40 mg via ORAL
  Filled 2014-11-18 (×11): qty 1

## 2014-11-18 MED ORDER — NALOXONE HCL 0.4 MG/ML IJ SOLN: 0.0800 mg | INTRAMUSCULAR | Status: DC | PRN

## 2014-11-18 NOTE — Progress Notes (Signed)
Rehab admissions - I met with patient and I spoke with patient's brother by phone.  Patient's brother says he can provide supervision after discharge.  I will send information to [facility] requesting acute inpatient rehab admission.  I will update all once I hear back from insurance case manager.  Call me for questions.  #[phone]

## 2014-11-18 NOTE — Plan of Care (Signed)
Problem: RH SKIN INTEGRITY Goal: RH STG SKIN FREE OF INFECTION/BREAKDOWN Remain free of skin breakdown and infection mod I

## 2014-11-18 NOTE — Progress Notes (Signed)
Reviewed rehab booklet, safety plan and pt schedule with pt and ex wife. Pt verbally understanding with no further questions at this time. Family at bedside.

## 2014-11-18 NOTE — Progress Notes (Signed)
Speech Language Pathology Treatment: Dysphagia  Patient Details Name: BROK STOCKING MRN: [id number] DOB: 1946-05-08 Today's Date: 11/18/2014 Time: 0940-7680 SLP Time Calculation (min) (ACUTE ONLY): 12 min  Assessment / Plan / Recommendation Clinical Impression  Patient making great progress with functional goals. Improved sustained attention to task noted today with patient able to self feeding diagnostic po trials to assess readiness for diet advancement without cues to redirect, with timely oral transit of bolus, full clearance of oral cavity, and no overt indication of aspiration. Min verbal cueing/reminders provided to continue to utilize intermittent lingual sweep and liquid wash as a precautions to clear potential left sided buccal residuals. Overall, patient appears safe to advance to regular solids, thin liquids given level of improvements in function made. Will advance diet and f/u briefly. Recommend cognitive-linguistic evaluation in addition to f/u for dysphagia once discharged to CIR.    HPI Other Pertinent Information: Patient is a 69 year old male with a history of hypertension, hyperlipidemia, prostate cancer, and small bowel obstruction who presents to the emergency department for further evaluation of headache. Patient reports a constant, dull frontal headache 2 weeks. He had acute worsening of his headache 1 week ago for 24 hours and began to notice dizziness approximately 2 days later. Patient describes the dizziness as a sense of being off balance. He has experienced generalized weakness and unsteady gait 3 days. Gait difficulty has caused patient to fall a few times. Pt s/p frontotemporoparietal craniotomy. Back to sx 6/1 for craniectomy. Flap in R stomach pouch.   Pertinent Vitals Pain Assessment: No/denies pain Faces Pain Scale: No hurt  SLP Plan  Continue with current plan of care    Recommendations Diet recommendations: Regular;Thin liquid Liquids provided via:  Cup;Straw Medication Administration: Whole meds with puree Supervision: Patient able to self feed;Full supervision/cueing for compensatory strategies Compensations: Slow rate;Small sips/bites;Check for pocketing;Check for anterior loss;Follow solids with liquid Postural Changes and/or Swallow Maneuvers: Seated upright 90 degrees              Oral Care Recommendations: Oral care BID Follow up Recommendations:  (TBD) Plan: Continue with current plan of care    [place], [place] [phone]   [place] 11/18/2014, 1:31 PM

## 2014-11-18 NOTE — Discharge Summary (Signed)
Physician Discharge Summary  Patient ID: MONTREAL STEIDLE MRN: [id number] DOB/AGE: 12/02/1945 69 y.o.  Admit date: 11/11/2014 Discharge date: 11/18/2014  Admission Diagnoses:craniotomy for evacuation of subdural hematoma. Harvesting of bone flap in the abdominal wall  Discharge Diagnoses:  Active Problems:   Subdural hematoma   Discharged Condition: stable  Hospital Course: craniotomy  Consult  Neurology. Rehabilitation medicine  Significant Diagnostic Studies: ct head. EEG  Treatments: surgery  Discharge Exam: Blood pressure 149/68, pulse 70, temperature 98.4 F (36.9 C), temperature source Oral, resp. rate 17, height 5\' 9"  (1.753 m), weight 79.6 kg (175 lb 7.8 oz), SpO2 100 %. Awake, no more seizure episodes  Disposition: to rehabilitation medicine     Medication List    TAKE these medications        amLODipine 10 MG tablet  Commonly known as:  NORVASC  Take 1 tablet (10 mg total) by mouth daily.     atorvastatin 40 MG tablet  Commonly known as:  LIPITOR  Take 1 tablet (40 mg total) by mouth daily.     hydrochlorothiazide 12.5 MG capsule  Commonly known as:  MICROZIDE  Take 1 capsule (12.5 mg total) by mouth daily.     lisinopril 40 MG tablet  Commonly known as:  PRINIVIL,ZESTRIL  Take 1 tablet (40 mg total) by mouth daily.     pravastatin 40 MG tablet  Commonly known as:  PRAVACHOL  Take 1 tablet (40 mg total) by mouth daily.         Signed: Floyce Stakes 11/18/2014, 1:47 PM

## 2014-11-18 NOTE — Progress Notes (Signed)
Pt arrived to unit at 1625. Helmet ordered and arrived with fitting of pt. Pt denies pain at this time. Hypoallergenic sheets ordered for rash to back.

## 2014-11-18 NOTE — PMR Pre-admission (Signed)
PMR Admission Coordinator Pre-Admission Assessment  Patient: Martin Edwards is an 69 y.o., male MRN: [id number] DOB: March 28, 1946 Height: 5\' 9"  (175.3 cm) Weight: 79.6 kg (175 lb 7.8 oz)              Insurance Information HMO: Yes    PPO:       PCP:       IPA:       80/20:       OTHER:  Group # [id number] PRIMARY: [employer] Medicare      Policy#: [id number]      Subscriber: Myrene Buddy CM Name: Geryl Rankins      Phone#: [phone]    Fax#:   Pre-Cert#: [id number] X 7 days      Employer: Retired Benefits:  Phone #: [phone]     Name: Automated Eff. 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 Patient with complaints of frontal HA with dizziness for a couple of weeks PTA. He reported being off balance with multiple recent falls. 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 He was taken to OR emergently for right frontotemporal parietal crani for evacuation of SDH. He developed lethary on 06/01 and CT head revealed recurrent SDH. He was taken to OR for craniectomy with placement of bone flap in abdomen and for evacuation with recurrent SDH. Post op developed frequent focal seizures  consisting of left gaze deviation with facial and occasional arm twitching. Neurology consulted and recommended adding Keppra to dilantin as well as treatment of UTI. Seizures have resolved with improvement in mentation. Therapy initiated and patient continues to be limited by left inattention and unsteady gait. CIR recommended by MD and Rehab team. Patient cleared medically for admission today.   Total: 4=NIH  Past Medical History  Past Medical History  Diagnosis Date  . Hypertension   . Hyperlipidemia   . Kidney stones     "passed them"  . Prostate cancer 1999  . Basal cell carcinoma of face X 2  . Small bowel obstruction due to adhesions 01/30/2014    Family History  family history includes Cancer in his father.  Prior Rehab/Hospitalizations:  Has the patient had major surgery during 100 days prior to admission? No  Current Medications   Current facility-administered medications:  .  0.9 %  sodium chloride infusion, , Intravenous, Once, Leeroy Cha, MD .  0.9 %  sodium chloride infusion, , Intravenous, Continuous, Leeroy Cha, MD, Stopped at 11/13/14 1750 .  0.9 %  sodium chloride infusion, , Intravenous,  Continuous, Leeroy Cha, MD, Last Rate: 75 mL/hr at 11/16/14 2014 .  acetaminophen (TYLENOL) suppository 650 mg, 650 mg, Rectal, Q4H PRN, Leeroy Cha, MD, 650 mg at 11/13/14 1336 .  acetaminophen (TYLENOL) tablet 650 mg, 650 mg, Oral, Q4H PRN, Leeroy Cha, MD, 650 mg at 11/16/14 1242 .  amLODipine (NORVASC) tablet 10 mg, 10 mg, Oral, Daily, Leeroy Cha, MD, 10 mg at 11/18/14 1142 .  atorvastatin (LIPITOR) tablet 40 mg, 40 mg, Oral, q1800, Leeroy Cha, MD, 40 mg at 11/17/14 1838 .  cefTRIAXone (ROCEPHIN) 2 g in dextrose 5 % 50 mL IVPB - Premix, 2 g, Intravenous, Q24H, Consuella Lose, MD, 2 g at 11/17/14 2022 .  hydrochlorothiazide (MICROZIDE) capsule 12.5 mg, 12.5 mg, Oral, Daily, Leeroy Cha, MD, 12.5 mg at 11/18/14 1142 .  HYDROcodone-acetaminophen  (NORCO/VICODIN) 5-325 MG per tablet 1-2 tablet, 1-2 tablet, Oral, Q4H PRN, Leeroy Cha, MD, 2 tablet at 11/17/14 2056 .  labetalol (NORMODYNE,TRANDATE) injection 10-40 mg, 10-40 mg, Intravenous, Q10 min PRN, Leeroy Cha, MD .  levETIRAcetam (KEPPRA) 1,000 mg in sodium chloride 0.9 % 100 mL IVPB, 1,000 mg, Intravenous, Q12H, Greta Doom, MD, 1,000 mg at 11/18/14 1141 .  lisinopril (PRINIVIL,ZESTRIL) tablet 40 mg, 40 mg, Oral, Daily, Leeroy Cha, MD, 40 mg at 11/18/14 1141 .  morphine 2 MG/ML injection 1-2 mg, 1-2 mg, Intravenous, Q2H PRN, Leeroy Cha, MD .  naloxone [facility]) injection 0.08 mg, 0.08 mg, Intravenous, PRN, Leeroy Cha, MD .  ondansetron [facility]) tablet 4 mg, 4 mg, Oral, Q4H PRN **OR** ondansetron (ZOFRAN) injection 4 mg, 4 mg, Intravenous, Q4H PRN, Leeroy Cha, MD .  pantoprazole (PROTONIX) EC tablet 40 mg, 40 mg, Oral, QHS, Leeroy Cha, MD, 40 mg at 11/17/14 2159 .  phenytoin (DILANTIN) injection 100 mg, 100 mg, Intravenous, 3 times per day, Greta Doom, MD, 100 mg at 11/18/14 0512 .  promethazine (PHENERGAN) tablet 12.5-25 mg, 12.5-25 mg, Oral, Q4H PRN, Leeroy Cha, MD .  senna [facility]) tablet 8.6 mg, 1 tablet, Oral, BID, Consuella Lose, MD, 8.6 mg at 11/18/14 1142  Patients Current Diet: Diet regular Room service appropriate?: Yes; Fluid consistency:: Thin  Precautions / Restrictions Precautions Precautions: Fall Precaution Comments: L inattention improving Restrictions Weight Bearing Restrictions: No   Has the patient had 2 or more falls or a fall with injury in the past year?Yes.  Patient fell on 11/11/14 while in his shower at home.  Then was admitted to hospital for TBI and SDH.  Prior Activity Level Community (5-7x/wk): Went out daily, was driving.  Played tennis doubles.  Reitred from [place] Devices/Equipment: None Home Equipment: Grab bars -  tub/shower  Prior Device Use: Indicate devices/aids used by the patient prior to current illness, exacerbation or injury? None of the aboveDid not use any device PTA.  Was completely independent.  Prior Functional Level Prior Function Level of Independence: Independent  Self Care: Did the patient need help bathing, dressing, using the toilet or eating?  Independent  Indoor Mobility: Did the patient need assistance with walking from room to room (with or without device)? Independent  Stairs: Did the patient need assistance with internal or external stairs (with or without device)? Independent  Functional Cognition: Did the patient need help planning regular tasks such as shopping or remembering to take medications? Independent  Current Functional Level Cognition  Overall Cognitive Status: Impaired/Different from baseline Current Attention Level: Sustained Orientation Level: Oriented X4 Following Commands: Follows one step  commands consistently Safety/Judgement: Decreased awareness of deficits, Decreased awareness of safety General Comments: Pt fluctuated between wakefulness and lethargy.  Slow to respond at times.  Lt inattention, but able to attend to Lt with min verbal cues.  Pt asking same questions repetitively at times     Extremity Assessment (includes Sensation/Coordination)  Upper Extremity Assessment: LUE deficits/detail LUE Deficits / Details: no active wrist extension or finger movement noted.  Did note shoulder flexion and elbow flexion/ extension LUE Sensation: decreased proprioception LUE Coordination: decreased fine motor, decreased gross motor  Lower Extremity Assessment: Generalized weakness (or slow recruitment of muscles or inattension)    ADLs  Overall ADL's : Needs assistance/impaired Grooming: Wash/dry hands, Wash/dry face, Moderate assistance, Sitting Grooming Details (indicate cue type and reason): mod verbla cues  Toilet Transfer: Moderate assistance, +2  for physical assistance, Stand-pivot, BSC Toileting- Clothing Manipulation and Hygiene: Total assistance, Sit to/from stand Functional mobility during ADLs: Moderate assistance, +2 for physical assistance General ADL Comments: Pt with focal seizures throughout the day.  Pt loaded on Keppra and Ativan per RN.  Family present and instructed in activites to drive attention to the Lt     Mobility  Overal bed mobility: Needs Assistance Bed Mobility: Supine to Sit Supine to sit: Supervision General bed mobility comments: Able to don socks at EOB with modified independence    Transfers  Overall transfer level: Needs assistance Equipment used: None Transfers: Sit to/from Stand, Stand Pivot Transfers Sit to Stand: Min guard Stand pivot transfers: Min assist General transfer comment: Pt required use of hands for transfers    Ambulation / Gait / Stairs / Wheelchair Mobility  Ambulation/Gait Ambulation/Gait assistance: Museum/gallery curator (Feet): 200 Feet Assistive device: None General Gait Details: Gait slow; unable to vary speed changes; rigid, kyphotic posture; left lateral weight shift during left stance, "waddling" appearance. Pt was able shift gaze from L to R but noted increased difficulty Gait Pattern/deviations: Step-through pattern, Decreased stride length, Trunk flexed, Drifts right/left Gait velocity: Slow Gait velocity interpretation: Below normal speed for age/gender    Posture / Balance Dynamic Sitting Balance Sitting balance - Comments: listing L and posteriorly into post tilt with head down.  Pt able to be cued to sit more upright with head up, but unable to hold without repetitive cuing. Balance Overall balance assessment: Needs assistance Sitting-balance support: No upper extremity supported (Min guard for sitting) Sitting balance-Leahy Scale: Poor Sitting balance - Comments: listing L and posteriorly into post tilt with head down.  Pt able to be cued to sit more  upright with head up, but unable to hold without repetitive cuing. Standing balance support: Bilateral upper extremity supported Standing balance-Leahy Scale: Poor Standing balance comment: stood for 1-2 min with graded assist/support of L knee, trunk.  Working on pt attaining upright midline balance, but pt did need w/shift assist and trucal assist to facilitate coming forward. Standardized Balance Assessment Standardized Balance Assessment : Berg Balance Test Berg Balance Test Sit to Stand: Able to stand  independently using hands Standing Unsupported: Able to stand 30 seconds unsupported Sitting with Back Unsupported but Feet Supported on Floor or Stool: Able to sit safely and securely 2 minutes Stand to Sit: Uses backs of legs against chair to control descent Transfers: Able to transfer with verbal cueing and /or supervision Standing Unsupported with Eyes Closed: Able to stand 3 seconds Standing Ubsupported with Feet Together: Needs help to attain position but able to stand for 30 seconds with feet together From  Standing, Reach Forward with Outstretched Arm: Reaches forward but needs supervision From Standing Position, Pick up Object from Floor: Unable to pick up shoe, but reaches 2-5 cm (1-2") from shoe and balances independently From Standing Position, Turn to Look Behind Over each Shoulder: Looks behind one side only/other side shows less weight shift Turn 360 Degrees: Needs close supervision or verbal cueing Standing Unsupported, Alternately Place Feet on Step/Stool: Able to complete >2 steps/needs minimal assist Standing Unsupported, One Foot in Front: Able to take small step independently and hold 30 seconds Standing on One Leg: Tries to lift leg/unable to hold 3 seconds but remains standing independently Total Score: 27    Special needs/care consideration BiPAP/CPAP No CPM No Continuous Drip IV 0.9% NS at 75 ml/hr Dialysis No         Life Vest No Oxygen No Special Bed  No Trach Size No Wound Vac (area) No       Skin Has stapled incision right craniotomy incision                              Bowel mgmt: Last BM 11/17/14 Bladder mgmt: Catheter removed 11/17/14 and has voided without incontinence Diabetic mgmt No    Previous Home Environment Living Arrangements: Alone Available Help at Discharge: Family, Available PRN/intermittently, Other (Comment) (son/brother state they will try to come up with more help) Type of Home: House Home Layout: One level Home Access: Stairs to enter Home Care Services: No  Discharge Living Setting Plans for Discharge Living Setting: Patient's home, Alone, House (Brother can stay with patient.) Type of Home at Discharge: House Discharge Home Layout: One level Discharge Home Access: Stairs to enter Entrance Stairs-Number of Steps: 3-4 steps up to porch entry Does the patient have any problems obtaining your medications?: No  Social/Family/Support Systems Patient Roles: Parent, Other (Comment) (Has a brother, son and a daughter.) Contact Information: Kyzer Blowe - son [phone] Anticipated Caregiver: Rowland Ericsson - brother Anticipated Caregiver's Contact Information: Christiane Ha - brother [phone] Ability/Limitations of Caregiver: Brother is retired and can assist.  Son works.  Dtr lives in [place]. Caregiver Availability: 24/7 Discharge Plan Discussed with Primary Caregiver: Yes Is Caregiver In Agreement with Plan?: Yes Does Caregiver/Family have Issues with Lodging/Transportation while Pt is in Rehab?: No  Goals/Additional Needs Patient/Family Goal for Rehab: PT/OT/ST supervision goals Expected length of stay: 10-14 days Cultural Considerations: [employer] Dietary Needs: Regular diet, thin liquids Equipment Needs: TBD Pt/Family Agrees to Admission and willing to participate: Yes Program Orientation Provided & Reviewed with Pt/Caregiver Including Roles  & Responsibilities: Yes  Decrease burden of Care through IP  rehab admission: N/A  Possible need for SNF placement upon discharge: Not anticipated  Patient Condition: This patient's medical and functional status has changed since the consult dated: 11/12/14 in which the Rehabilitation Physician determined and documented that the patient's condition is appropriate for intensive rehabilitative care in an inpatient rehabilitation facility. See "History of Present Illness" (above) for medical update. Functional changes are: Currently requiring min assist to ambulate 200 ft with no device. Patient's medical and functional status update has been discussed with the Rehabilitation physician and patient remains appropriate for inpatient rehabilitation. Will admit to inpatient rehab today.  Preadmission Screen Completed By:  Retta Diones, 11/18/2014 1:32 PM ______________________________________________________________________   Discussed status with Dr. Letta Pate on 11/18/14 at 1332 and received telephone approval for admission today.  Admission Coordinator:  Retta Diones, time1332/Date06/06/16

## 2014-11-18 NOTE — Progress Notes (Deleted)
Physical Therapy Treatment Patient Details Name: Martin Edwards MRN: [id number] DOB: July 14, 1945 Today's Date: 11/18/2014    History of Present Illness Patient is a 69 year old male with a history of hypertension, hyperlipidemia, prostate cancer, and small bowel obstruction who presents to the emergency department for further evaluation of headache. Patient reports a constant, dull frontal headache 2 weeks. He had acute worsening of his headache 1 week ago for 24 hours and began to notice dizziness approximately 2 days later. Patient describes the dizziness as a sense of being off balance. He has experienced generalized weakness and unsteady gait 3 days. Gait difficulty has caused patient to fall a few times.  Pt s/p frontotemporoparietal  craniotomy. Back to sx 6/1 for craniectomy.  Flap in R stomach pouch.    PT Comments    Session focused on gait training with increased dynamic skills and balance assessment. Pt is progressing well towards goals. Continue to recommend discharge to CIR in order to further challenge balance, attending to his left side, and negotiating his environment.   Follow Up Recommendations  CIR     Equipment Recommendations  Other (comment) (TBA)    Recommendations for Other Services Rehab consult     Precautions / Restrictions Precautions Precautions: Fall Precaution Comments: L inattention improving Restrictions Weight Bearing Restrictions: No    Mobility  Bed Mobility Overal bed mobility: Needs Assistance Bed Mobility: Supine to Sit     Supine to sit: Supervision     General bed mobility comments: Able to don socks at EOB with modified independence  Transfers Overall transfer level: Needs assistance   Transfers: Sit to/from Stand;Stand Pivot Transfers Sit to Stand: Min guard Stand pivot transfers: Min guard       General transfer comment: Pt required use of hands for transfers  Ambulation/Gait Ambulation/Gait assistance: Min  assist Ambulation Distance (Feet): 200 Feet Assistive device: None Gait Pattern/deviations: Step-through pattern;Decreased stride length;Trunk flexed;Drifts right/left Gait velocity: Slow Gait velocity interpretation: Below normal speed for age/gender General Gait Details: Gait slow; unable to vary speed changes; rigid, kyphotic posture; left lateral weight shift during left stance, "waddling" appearance. Pt was able shift gaze from L to R but noted increased difficulty   Stairs            Wheelchair Mobility    Modified Rankin (Stroke Patients Only)       Balance Overall balance assessment: Needs assistance Sitting-balance support: No upper extremity supported (Min guard for sitting)                                Cognition Arousal/Alertness: Awake/alert Behavior During Therapy: Flat affect Overall Cognitive Status: Impaired/Different from baseline Area of Impairment: Attention;Awareness;Orientation   Current Attention Level: Sustained   Following Commands: Follows one step commands consistently   Awareness: Intellectual Problem Solving: Slow processing;Decreased initiation;Requires tactile cues      Exercises      General Comments General comments (skin integrity, edema, etc.): Although pt is continuing to improve, there were many times that he had to compensate to maintain balance. Overall he is at a high risk for falls per the [facility] Scale      Pertinent Vitals/Pain Pain Assessment: Faces Faces Pain Scale: No hurt    Home Living                      Prior Function  PT Goals (current goals can now be found in the care plan section) Acute Rehab PT Goals PT Goal Formulation: With patient Time For Goal Achievement: 11/26/14 Potential to Achieve Goals: Good Progress towards PT goals: Progressing toward goals    Frequency  Min 4X/week    PT Plan Current plan remains appropriate    Co-evaluation              End of Session   Activity Tolerance: Patient tolerated treatment well Patient left: in chair;with call bell/phone within reach     Time: 0938-1029 PT Time Calculation (min) (ACUTE ONLY): 51 min  Charges:  $Gait Training: 8-22 mins $Therapeutic Activity: 23-37 mins                    G Codes:      Shawna Orleans, SPT Acute Rehab Services Office: [phone]  Shawna Orleans 11/18/2014, 10:59 AM

## 2014-11-18 NOTE — Care Management Note (Signed)
Case Management Note  Patient Details  Name: UDAY JANTZ MRN: [id number] Date of Birth: [birth year]  Subjective/Objective:                    Action/Plan: Pt discharging to [facility] IP Rehab today.     Expected Discharge Date:     11/18/14             Expected Discharge Plan:  IP Rehab Facility  In-House Referral:  Clinical Social Work  Discharge planning Services  CM Consult  Post Acute Care Choice:    Choice offered to:     DME Arranged:    DME Agency:     HH Arranged:    [place] Agency:     Status of Service:  Completed, signed off  Medicare Important Message Given:  Yes Date Medicare IM Given:  11/18/14 Medicare IM give by:  Ellan Lambert, RN, BSN  Date Additional Medicare IM Given:    Additional Medicare Important Message give by:     If discussed at [place] of Stay Meetings, dates discussed:    Additional Comments:  Reinaldo Raddle, RN, BSN  Trauma/Neuro ICU Case Manager [phone]

## 2014-11-18 NOTE — Progress Notes (Signed)
Patient ID: Martin Edwards, male   DOB: [birth year], 69 y.o.   MRN: [id number] Much better. No more seizures. Rehabilitation to see. Wound dry. No weakness

## 2014-11-18 NOTE — H&P (Signed)
Physical Medicine and Rehabilitation Admission H&P   Chief Complaint  Patient presents with  . Falls    HPI: Martin Edwards is a 69 year old male with h/o HTN, prostate cancer, renal calculi; who slipped and fell while showering on 11/11/14. Patient with complaints of frontal HA with dizziness for a couple of weeks PTA. He reported being off balance with multiple recent falls. 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 He was taken to OR emergently for right frontotemporal parietal crani for evacuation of SDH. He developed lethary on 06/01 and CT head revealed recurrent SDH. He was taken to OR for craniectomy with placement of bone flap in abdomen and for evacuation with recurrent SDH. Post op developed frequent focal seizures consisting of left gaze deviation with facial and occasional arm twitching. Neurology consulted and recommended adding Keppra to dilantin as well as treatment of UTI. Seizures have resolved with improvement in mentation. Therapy initiated and patient continues to be limited by left inattention and unsteady gait. CIR recommended by MD and Rehab team. Patient cleared medically for admission today.   Patient is eating dinner visiting with ex-wife   Review of Systems  Eyes: Negative for blurred vision and double vision.  Respiratory: Negative for cough and shortness of breath.  Cardiovascular: Negative for chest pain and palpitations.  Gastrointestinal: Positive for constipation. Negative for heartburn and nausea.  Genitourinary: Positive for urgency. Negative for dysuria.  Musculoskeletal: Negative for myalgias and back pain.  Neurological: Positive for weakness and headaches. Negative for dizziness and focal weakness.  Endo/Heme/Allergies: Negative.  Psychiatric/Behavioral: Negative for depression.      Past Medical History  Diagnosis Date  . Hypertension   . Hyperlipidemia   . Kidney  stones     "passed them"  . Prostate cancer 1999  . Basal cell carcinoma of face X 2  . Small bowel obstruction due to adhesions 01/30/2014    Past Surgical History  Procedure Laterality Date  . Prostatectomy  05/20/1998  . Colectomy  10/2010    "took out 8 inches of colon; wasn't cancer"  . Basal cell carcinoma excision  X 2    "off my face both times"  . Craniotomy Right 11/11/2014    Procedure: CRANIOTOMY HEMATOMA EVACUATION SUBDURAL; Surgeon: Leeroy Cha, MD; Location: [facility]; Service: Neurosurgery; Laterality: Right;  . Craniotomy Right 11/13/2014    Procedure: Removal bone flap, insertion into the abdominal wall; Surgeon: Leeroy Cha, MD; Location: [facility]; Service: Neurosurgery; Laterality: Right;    Family History  Problem Relation Age of Onset  . Cancer Father     Lung    Social History: Divorced. 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 Lives alone and independent PTA. reports that he has never smoked. He has never used smokeless tobacco. He reports that he drinks beer couple of times a year. He reports that he does not use illicit drugs.    Allergies: No Known Allergies    Medications Prior to Admission  Medication Sig Dispense Refill  . amLODipine (NORVASC) 10 MG tablet Take 1 tablet (10 mg total) by mouth daily. 90 tablet 1  . atorvastatin (LIPITOR) 40 MG tablet Take 1 tablet (40 mg total) by mouth daily. 90 tablet 3  . hydrochlorothiazide (MICROZIDE) 12.5 MG capsule Take 1 capsule (12.5 mg total) by mouth daily. 90 capsule 3  . lisinopril (PRINIVIL,ZESTRIL) 40 MG tablet Take 1 tablet (40 mg total) by mouth daily.  90 tablet 1  . pravastatin (PRAVACHOL) 40 MG tablet Take 1 tablet (40 mg total) by mouth daily. 90 tablet 1    Home: Home Living Family/patient expects to be discharged to:: Private residence Living Arrangements:  Alone Available Help at Discharge: Family, Available PRN/intermittently, Other (Comment) (son/brother state they will try to come up with more help) Type of Home: House Home Access: Stairs to enter Home Layout: One level Home Equipment: Grab bars - tub/shower  Functional History: Prior Function Level of Independence: Independent  Functional Status:  Mobility: Bed Mobility Overal bed mobility: Needs Assistance Bed Mobility: Supine to Sit Supine to sit: Supervision General bed mobility comments: Able to don socks at EOB with modified independence Transfers Overall transfer level: Needs assistance Equipment used: None Transfers: Sit to/from Stand, Stand Pivot Transfers Sit to Stand: Min guard Stand pivot transfers: Min assist General transfer comment: Pt required use of hands for transfers Ambulation/Gait Ambulation/Gait assistance: Min assist Ambulation Distance (Feet): 200 Feet Assistive device: None General Gait Details: Gait slow; unable to vary speed changes; rigid, kyphotic posture; left lateral weight shift during left stance, "waddling" appearance. Pt was able shift gaze from L to R but noted increased difficulty Gait Pattern/deviations: Step-through pattern, Decreased stride length, Trunk flexed, Drifts right/left Gait velocity: Slow Gait velocity interpretation: Below normal speed for age/gender    ADL: ADL Overall ADL's : Needs assistance/impaired Grooming: Wash/dry hands, Wash/dry face, Moderate assistance, Sitting Grooming Details (indicate cue type and reason): mod verbla cues  Toilet Transfer: Moderate assistance, +2 for physical assistance, Stand-pivot, BSC Toileting- Clothing Manipulation and Hygiene: Total assistance, Sit to/from stand Functional mobility during ADLs: Moderate assistance, +2 for physical assistance General ADL Comments: Pt with focal seizures throughout the day. Pt loaded on Keppra and Ativan per RN. Family present and instructed in  activites to drive attention to the Lt   Cognition: Cognition Overall Cognitive Status: Impaired/Different from baseline Orientation Level: Oriented X4 Cognition Arousal/Alertness: Awake/alert Behavior During Therapy: Flat affect Overall Cognitive Status: Impaired/Different from baseline Area of Impairment: Attention, Awareness, Orientation Orientation Level: Time Current Attention Level: Sustained Memory: Decreased short-term memory Following Commands: Follows one step commands consistently Safety/Judgement: Decreased awareness of deficits, Decreased awareness of safety Awareness: Intellectual Problem Solving: Slow processing, Decreased initiation, Requires tactile cues General Comments: Pt fluctuated between wakefulness and lethargy. Slow to respond at times. Lt inattention, but able to attend to Lt with min verbal cues. Pt asking same questions repetitively at times    Blood pressure 149/68, pulse 70, temperature 98.4 F (36.9 C), temperature source Oral, resp. rate 17, height 5\' 9"  (1.753 m), weight 79.6 kg (175 lb 7.8 oz), SpO2 100 %. Physical Exam  Nursing note and vitals reviewed. Constitutional: He is oriented to person, place, and time. He appears well-developed and well-nourished.  HENT:  Head: Normocephalic and atraumatic.  Right crani incision with staples intact--clean and dry.  Eyes: Conjunctivae are normal. Pupils are equal, round, and reactive to light.  Neck: Normal range of motion. Neck supple.  Cardiovascular: Normal rate and regular rhythm.  Respiratory: Effort normal and breath sounds normal. He has no wheezes.  GI: Soft. Bowel sounds are normal. He exhibits no distension. There is tenderness (RLQ with min edema and dry dressing on incision. ).  Neurological: He is alert and oriented to person, place, and time.  Motor strength is 4/5 bilateral deltoids, biceps, triceps, grip, hip flexor, knee extensor, ankle dorsi flexor and plantar flexor No evidence of  abnormal tone Difficulty with fine motor  finger to thumb opposition bilaterally  Skin: Skin is warm and dry.  Psychiatric: He has a normal mood and affect. His behavior is normal.     Lab Results Last 48 Hours    No results found for this or any previous visit (from the past 48 hour(s)).    Imaging Results (Last 48 hours)    No results found.       Medical Problem List and Plan: 1. 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 DVT Prophylaxis/Anticoagulation: Mechanical: Sequential compression devices, below knee Bilateral lower extremities 3. Pain Management: Continue Vicodin prn as effective.  4. Mood: LCSW to follow for evaluation and support.  5. Neuropsych: This patient is capable of making decisions on his own behalf. 6. Skin/Wound Care: Routine pressure relief measure in am.  7. Fluids/Electrolytes/Nutrition: Monitor I/O. Check lytes in am.  8. New onset focal motor seizures: Continue dilantin and Keppra 1000 mg bid. Check dilantin level in am. 9. HTN: Will monitor BP every 8 hours. Continue Norvasc, HCTZ and lisinopril.  10. Hyponatremia: Likely dilutional due to IVF--is improving. Will recheck labs in am.  11. Hypokalemia: On HCTZ. Supplement for now.  12. Staph UTI: On Rocephin--PCN resistant. Will change to Levaquin D #1/3-5   Post Admission Physician Evaluation: 1. 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 Patient is admitted to receive collaborative, interdisciplinary care between the physiatrist, rehab nursing staff, and therapy team. 3. Patient's level of medical complexity and substantial therapy needs in context of that medical necessity cannot be provided at a lesser intensity of care such as a SNF. 4. Patient has experienced substantial  functional loss from his/her baseline which was documented above under the "Functional History" and "Functional Status" headings. Judging by the patient's diagnosis, physical exam, and functional history, the patient has potential for functional progress which will result in measurable gains while on inpatient rehab. These gains will be of substantial and practical use upon discharge in facilitating mobility and self-care at the household level. 5. Physiatrist will provide 24 hour management of medical needs as well as oversight of the therapy plan/treatment and provide guidance as appropriate regarding the interaction of the two. 6. 24 hour rehab nursing will assist with bladder management, bowel management, safety, skin/wound care, disease management, medication administration, pain management and patient education and help integrate therapy concepts, techniques,education, etc. 7. PT will assess and treat for/with: pre gait, gait training, endurance , safety, equipment, neuromuscular re education. Goals are: Supervision. 8. OT will assess and treat for/with: ADLs, Cognitive perceptual skills, Neuromuscular re education, safety, endurance, equipment. Goals are: Supervision. Therapy may proceed with showering this patient. 9. SLP will assess and treat for/with: Memory, attention, concentration, problem solving, medication management. Goals are: Supervision with medication management. 10. Case Management and Social Worker will assess and treat for psychological issues and discharge planning. 11. Team conference will be held weekly to assess progress toward goals and to determine barriers to discharge. 12. Patient will receive at least 3 hours of therapy per day at least 5 days per week. 13. ELOS: 10-14 days  14. Prognosis: good     Charlett Blake M.D. [facility] FAAPM&R (Sports Med, Neuromuscular Med) Diplomate [place] Board of Electrodiagnostic Med  11/18/2014

## 2014-11-18 NOTE — Progress Notes (Signed)
Orthopedic Tech Progress Note Patient Details:  Martin Edwards 12/07/1945 [id number] Called [facility] for brace order. Patient ID: [name], male   DOB: [birth year], 69 y.o.   MRN: [id number]   Braulio Bosch 11/18/2014, 4:36 PM

## 2014-11-18 NOTE — Progress Notes (Addendum)
Physical Therapy Treatment Patient Details Name: Martin Edwards MRN: [id number] DOB: 04-16-1946 Today's Date: 11/18/2014    History of Present Illness Patient is a 69 year old male with a history of hypertension, hyperlipidemia, prostate cancer, and small bowel obstruction who presents to the emergency department for further evaluation of headache. Patient reports a constant, dull frontal headache 2 weeks. He had acute worsening of his headache 1 week ago for 24 hours and began to notice dizziness approximately 2 days later. Patient describes the dizziness as a sense of being off balance. He has experienced generalized weakness and unsteady gait 3 days. Gait difficulty has caused patient to fall a few times.  Pt s/p frontotemporoparietal  craniotomy. Back to sx 6/1 for craniectomy.  Flap in R stomach pouch.    PT Comments    Emphasis on gait training and challenging balance.  Continue to recommend CIR for further balance challenges, working to improve attention to Left and negotiating his environment.  Follow Up Recommendations  CIR     Equipment Recommendations  Other (comment) (TBA)    Recommendations for Other Services Rehab consult     Precautions / Restrictions Precautions Precautions: Fall Precaution Comments: L inattention improving Restrictions Weight Bearing Restrictions: No    Mobility  Bed Mobility Overal bed mobility: Needs Assistance Bed Mobility: Supine to Sit     Supine to sit: Supervision     General bed mobility comments: Able to don socks at EOB with modified independence  Transfers Overall transfer level: Needs assistance   Transfers: Sit to/from Stand;Stand Pivot Transfers Sit to Stand: Min guard Stand pivot transfers: Min assist       General transfer comment: Pt required use of hands for transfers  Ambulation/Gait Ambulation/Gait assistance: Min assist Ambulation Distance (Feet): 200 Feet Assistive device: None Gait Pattern/deviations:  Step-through pattern;Decreased stride length;Trunk flexed;Drifts right/left Gait velocity: Slow Gait velocity interpretation: Below normal speed for age/gender General Gait Details: Gait slow; unable to vary speed changes; rigid, kyphotic posture; left lateral weight shift during left stance, "waddling" appearance. Pt was able shift gaze from L to R but noted increased difficulty   Stairs            Wheelchair Mobility    Modified Rankin (Stroke Patients Only)       Balance Overall balance assessment: Needs assistance Sitting-balance support: No upper extremity supported (Min guard for sitting)                                Cognition Arousal/Alertness: Awake/alert Behavior During Therapy: Flat affect Overall Cognitive Status: Impaired/Different from baseline Area of Impairment: Attention;Awareness;Orientation   Current Attention Level: Sustained   Following Commands: Follows one step commands consistently   Awareness: Intellectual Problem Solving: Slow processing;Decreased initiation;Requires tactile cues      Exercises      General Comments General comments (skin integrity, edema, etc.): Although pt is continuing to improve, there were many times that he had to compensate to maintain balance. Overall he is at a high risk for falls per the [facility] Scale      Pertinent Vitals/Pain Pain Assessment: Faces Faces Pain Scale: No hurt    Home Living                      Prior Function            PT Goals (current goals can now be found in the care plan  section) Acute Rehab PT Goals PT Goal Formulation: With patient Time For Goal Achievement: 11/26/14 Potential to Achieve Goals: Good Progress towards PT goals: Progressing toward goals    Frequency  Min 4X/week    PT Plan Current plan remains appropriate    Co-evaluation             End of Session   Activity Tolerance: Patient tolerated treatment well Patient left: in  chair;with call bell/phone within reach     Time: 0938-1029 PT Time Calculation (min) (ACUTE ONLY): 51 min  Charges:  $Gait Training: 8-22 mins $Therapeutic Activity: 23-37 mins                    G Codes:      Harlym Gehling, Tessie Fass 11/18/2014, 11:24 AM 11/18/2014   Written by Shawna Orleans, [place], PT [phone]  (pager)

## 2014-11-18 NOTE — Progress Notes (Signed)
Rehab admissions - I have approval for acute inpatient rehab admission from [facility].  Bed available, patient and brother are agreeable, and will admit to acute inpatient rehab today.  Call me for questions.  #[phone]

## 2014-11-19 ENCOUNTER — Inpatient Hospital Stay (HOSPITAL_COMMUNITY): Payer: Medicare Other

## 2014-11-19 ENCOUNTER — Inpatient Hospital Stay (HOSPITAL_COMMUNITY): Payer: Medicare Other | Admitting: Speech Pathology

## 2014-11-19 ENCOUNTER — Inpatient Hospital Stay (HOSPITAL_COMMUNITY): Payer: Medicare Other | Admitting: Occupational Therapy

## 2014-11-19 DIAGNOSIS — R29818 Other symptoms and signs involving the nervous system: Secondary | ICD-10-CM | POA: Diagnosis present

## 2014-11-19 DIAGNOSIS — R29898 Other symptoms and signs involving the musculoskeletal system: Secondary | ICD-10-CM

## 2014-11-19 DIAGNOSIS — R449 Unspecified symptoms and signs involving general sensations and perceptions: Secondary | ICD-10-CM | POA: Diagnosis present

## 2014-11-19 DIAGNOSIS — R4189 Other symptoms and signs involving cognitive functions and awareness: Secondary | ICD-10-CM

## 2014-11-19 LAB — COMPREHENSIVE METABOLIC PANEL
ALT: 303 U/L — ABNORMAL HIGH (ref 17–63)
ANION GAP: 12 (ref 5–15)
AST: 184 U/L — ABNORMAL HIGH (ref 15–41)
Albumin: 2.9 g/dL — ABNORMAL LOW (ref 3.5–5.0)
Alkaline Phosphatase: 288 U/L — ABNORMAL HIGH (ref 38–126)
BILIRUBIN TOTAL: 0.4 mg/dL (ref 0.3–1.2)
BUN: 8 mg/dL (ref 6–20)
CALCIUM: 9 mg/dL (ref 8.9–10.3)
CO2: 28 mmol/L (ref 22–32)
CREATININE: 0.9 mg/dL (ref 0.61–1.24)
Chloride: 90 mmol/L — ABNORMAL LOW (ref 101–111)
GFR calc Af Amer: >60 mL/min (ref >60)
GFR calc non Af Amer: >60 mL/min (ref >60)
GLUCOSE: 139 mg/dL — AB (ref 65–99)
Potassium: 3.2 mmol/L — ABNORMAL LOW (ref 3.5–5.1)
SODIUM: 130 mmol/L — AB (ref 135–145)
TOTAL PROTEIN: 7.3 g/dL (ref 6.5–8.1)

## 2014-11-19 LAB — CBC WITH DIFFERENTIAL/PLATELET
BASOS PCT: 0 % (ref 0–1)
Basophils Absolute: 0 10*3/uL (ref 0.0–0.1)
EOS ABS: 0 10*3/uL (ref 0.0–0.7)
EOS PCT: 0 % (ref 0–5)
HEMATOCRIT: 36.6 % — AB (ref 39.0–52.0)
Hemoglobin: 12.7 g/dL — ABNORMAL LOW (ref 13.0–17.0)
LYMPHS PCT: 11 % — AB (ref 12–46)
Lymphs Abs: 1.2 10*3/uL (ref 0.7–4.0)
MCH: 30.9 pg (ref 26.0–34.0)
MCHC: 34.7 g/dL (ref 30.0–36.0)
MCV: 89.1 fL (ref 78.0–100.0)
MONO ABS: 1.3 10*3/uL — AB (ref 0.1–1.0)
MONOS PCT: 11 % (ref 3–12)
Neutro Abs: 8.9 10*3/uL — ABNORMAL HIGH (ref 1.7–7.7)
Neutrophils Relative %: 78 % — ABNORMAL HIGH (ref 43–77)
Platelets: 375 10*3/uL (ref 150–400)
RBC: 4.11 MIL/uL — ABNORMAL LOW (ref 4.22–5.81)
RDW: 13.3 % (ref 11.5–15.5)
WBC: 11.6 10*3/uL — AB (ref 4.0–10.5)

## 2014-11-19 LAB — PHENYTOIN LEVEL, TOTAL: PHENYTOIN LVL: 6.7 ug/mL — AB (ref 10.0–20.0)

## 2014-11-19 MED ORDER — TRIAMCINOLONE ACETONIDE 0.5 % EX CREA
TOPICAL_CREAM | Freq: Three times a day (TID) | CUTANEOUS | Status: DC
  Administered 2014-11-19 – 2014-11-22 (×11): via TOPICAL
  Administered 2014-11-23 (×2): 1 via TOPICAL
  Filled 2014-11-19 (×3): qty 15

## 2014-11-19 MED ORDER — TRAZODONE HCL 50 MG PO TABS
50.0000 mg | ORAL_TABLET | Freq: Every day | ORAL | Status: DC
  Administered 2014-11-19 – 2014-11-27 (×9): 50 mg via ORAL
  Filled 2014-11-19 (×11): qty 1

## 2014-11-19 MED ORDER — POTASSIUM CHLORIDE CRYS ER 20 MEQ PO TBCR
20.0000 meq | EXTENDED_RELEASE_TABLET | Freq: Three times a day (TID) | ORAL | Status: AC
  Administered 2014-11-19 – 2014-11-21 (×6): 20 meq via ORAL
  Filled 2014-11-19 (×6): qty 1

## 2014-11-19 MED ORDER — LORATADINE 10 MG PO TABS
10.0000 mg | ORAL_TABLET | Freq: Every day | ORAL | Status: DC
  Administered 2014-11-19 – 2014-11-28 (×10): 10 mg via ORAL
  Filled 2014-11-19 (×14): qty 1

## 2014-11-19 MED ORDER — SODIUM CHLORIDE 1 G PO TABS
1.0000 g | ORAL_TABLET | Freq: Three times a day (TID) | ORAL | Status: DC
  Administered 2014-11-19 – 2014-11-28 (×26): 1 g via ORAL
  Filled 2014-11-19 (×30): qty 1

## 2014-11-19 NOTE — Evaluation (Signed)
Occupational Therapy Assessment and Plan  Patient Details  Name: Martin Edwards MRN: [id number] Date of Birth: [birth year]  OT Diagnosis: acute pain, cognitive deficits and muscle weakness (generalized) Rehab Potential: Rehab Potential (ACUTE ONLY): Good ELOS: 10-12 days   Today's Date: 11/19/2014 OT Individual Time: 0700-0800 OT Individual Time Calculation (min): 60 min     Problem List:  Patient Active Problem List   Diagnosis Date Noted  . SDH (subdural hematoma) 11/18/2014  . Subdural hematoma 11/11/2014  . Small bowel obstruction due to adhesions 01/30/2014  . Sebaceous cyst x2 01/04/2014  . Prostate cancer 12/12/2012  . Hypertension 10/21/2011  . Hyperlipidemia 10/21/2011  . Adenomatous colon polyp 10/21/2011  . Postop check 12/29/2010    Past Medical History:  Past Medical History  Diagnosis Date  . Hypertension   . Hyperlipidemia   . Kidney stones     "passed them"  . Prostate cancer 1999  . Basal cell carcinoma of face X 2  . Small bowel obstruction due to adhesions 01/30/2014   Past Surgical History:  Past Surgical History  Procedure Laterality Date  . Prostatectomy  05/20/1998  . Colectomy  10/2010    "took out 8 inches of colon; wasn't cancer"  . Basal cell carcinoma excision  X 2    "off my face both times"  . Craniotomy Right 11/11/2014    Procedure: CRANIOTOMY HEMATOMA EVACUATION SUBDURAL;  Surgeon: Leeroy Cha, MD;  Location: [place] [facility];  Service: Neurosurgery;  Laterality: Right;  . Craniotomy Right 11/13/2014    Procedure: Removal bone flap, insertion into the abdominal wall;  Surgeon: Leeroy Cha, MD;  Location: [place] [facility];  Service: Neurosurgery;  Laterality: Right;    Assessment & Plan Clinical Impression: Patient is a 69 y.o. year old male with h/o HTN, prostate cancer, renal calculi; who slipped and fell while showering on 11/11/14. Patient with complaints of frontal HA with dizziness for a couple of weeks PTA. He reported being off  balance with multiple recent falls. 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 He was taken to OR emergently for right frontotemporal parietal crani for evacuation of SDH. He developed lethary on 06/01 and CT head revealed recurrent SDH. He was taken to OR for craniectomy with placement of bone flap in abdomen and for evacuation with recurrent SDH. Post op developed frequent focal seizures consisting of left gaze deviation with facial and occasional arm twitching. Neurology consulted and recommended adding Keppra to dilantin as well as treatment of UTI. Seizures have resolved with improvement in mentation.   Patient transferred to CIR on 11/18/2014 .    Patient currently requires min with basic self-care skills and basic mobility secondary to muscle weakness, decreased cardiorespiratoy endurance, acute pain, decreased visual motor skills, decreased attention to left, decreased attention, decreased awareness, decreased problem solving, decreased safety awareness and decreased memory and decreased standing balance, decreased postural control, decreased balance strategies and difficulty maintaining precautions.  Prior to hospitalization, patient could complete ADL with supervision.  Patient will benefit from skilled intervention to decrease level of assist with basic self-care skills and increase independence with basic self-care skills prior to discharge home with care partner.  Anticipate patient will require 24 hour supervision and follow up outpatient.  OT - End of Session Activity Tolerance: Tolerates 30+ min activity with multiple rests Endurance Deficit: Yes OT Assessment Rehab Potential (ACUTE ONLY): Good OT Patient demonstrates impairments in the following area(s): Balance;Cognition;Edema;Behavior;Endurance;Motor;Safety;Perception;Skin Integrity;Vision  OT Basic ADL's Functional Problem(s): Grooming;Bathing;Dressing;Toileting OT Transfers  Functional Problem(s): Toilet;Tub/Shower OT Additional Impairment(s): Fuctional Use of Upper Extremity OT Plan OT Intensity: Minimum of 1-2 x/day, 45 to 90 minutes OT Frequency: 5 out of 7 days OT Duration/Estimated Length of Stay: 10-12 days OT Treatment/Interventions: Balance/vestibular training;Cognitive remediation/compensation;Community reintegration;Discharge planning;Functional mobility training;Psychosocial support;Therapeutic Activities;Visual/perceptual remediation/compensation;UE/LE Coordination activities;DME/adaptive equipment instruction;Pain management;Skin care/wound managment;UE/LE Strength taining/ROM;Patient/family education;Self Care/advanced ADL retraining;Neuromuscular re-education;Therapeutic Exercise;Wheelchair propulsion/positioning OT Self Feeding Anticipated Outcome(s): no goal OT Basic Self-Care Anticipated Outcome(s): mod I  OT Toileting Anticipated Outcome(s): mod I  OT Bathroom Transfers Anticipated Outcome(s): mod I  OT Recommendation Patient destination: Home Follow Up Recommendations: Outpatient OT Equipment Recommended: To be determined   Skilled Therapeutic Intervention 1:1 OT eval initiated with OT purpose, role and goals. Self care retraining at shower level with focus on sit to stand, standing balance, shower stall transfers, short term recall, recall wearing helmet out of bed, standing endurance/ balance during dressing and grooming tasks sequencing and organizing.   OT Evaluation Precautions/Restrictions  Precautions Precautions: Fall Precaution Comments: L inattention improving; helmet when OOB Restrictions Weight Bearing Restrictions: No General Chart Reviewed: Yes Family/Caregiver Present: No Vital Signs Therapy Vitals Temp: 98.4 F (36.9 C) Temp Source: Oral Pulse Rate: 78 Resp: 18 BP: (!) 156/69 mmHg Patient Position (if appropriate): Lying Oxygen Therapy SpO2: 100 % O2 Device: Not Delivered Pain Pain Assessment Pain  Assessment: No/denies pain Home Living/Prior Functioning Home Living Available Help at Discharge: Family, Available PRN/intermittently, Other (Comment) Type of Home: House Home Access: Stairs to enter Home Layout: One level ADL ADL ADL Comments: see FIM Vision/Perception  Vision- History Baseline Vision/History: Wears glasses Wears Glasses: At all times Vision- Assessment Alignment/Gaze Preference: Head tilt (titled down to the right) Saccades: Decreased speed of saccadic movement;Additional head turns occurred during testing Convergence: Impaired - to be further tested in functional context  Cognition Arousal/Alertness: Awake/alert Orientation Level: Person;Place;Situation Person: Oriented Place: Oriented Situation: Oriented Year: 2016 Month: June Day of Week: Correct Memory: Impaired Memory Impairment: Decreased recall of new information;Decreased short term memory Immediate Memory Recall: Sock;Blue;Bed Memory Recall: Sock;Blue;Bed Memory Recall Sock: Without Cue Memory Recall Blue: Without Cue Memory Recall Bed: Without Cue Attention: Selective Selective Attention: Impaired Selective Attention Impairment: Functional basic Awareness: Impaired Awareness Impairment: Anticipatory impairment Problem Solving: Impaired Problem Solving Impairment: Functional complex Safety/Judgment: Impaired [employer] Scales of Cognitive Functioning: Purposeful/appropriate Sensation Sensation Light Touch: Appears Intact Stereognosis: Appears Intact Hot/Cold: Appears Intact Proprioception: Appears Intact Coordination Gross Motor Movements are Fluid and Coordinated: Yes Fine Motor Movements are Fluid and Coordinated: No Finger Nose Finger Test: decr [place] in functional tasks but able to compensate/ slower but able to complete Motor  Motor Motor - Skilled Clinical Observations: generalized weakness Mobility  Transfers Transfers: Sit to Stand;Stand to Sit Sit to Stand: 4: Min  guard Stand to Sit: 4: Min guard  Trunk/Postural Assessment  Cervical Assessment Cervical Assessment:  (head titled down and to the right) Thoracic Assessment Thoracic Assessment: Within Functional Limits Lumbar Assessment Lumbar Assessment: Within Functional Limits Postural Control Postural Control: Deficits on evaluation Righting Reactions: delayed  Balance Balance Balance Assessed: Yes Static Standing Balance Static Standing - Balance Support: During functional activity;No upper extremity supported Static Standing - Level of Assistance: 4: Min assist Dynamic Standing Balance Dynamic Standing - Balance Support: During functional activity;No upper extremity supported Dynamic Standing - Level of Assistance: 4: Min assist Extremity/Trunk Assessment RUE Assessment RUE Assessment: Within Functional Limits LUE Assessment LUE Assessment: Exceptions to [facility]  LUE AROM (degrees) LUE Overall AROM Comments: 110 LUE Strength LUE Overall Strength Comments: 4/5  FIM:  FIM - Eating Eating Activity: 6: More than reasonable amount of time FIM - Grooming Grooming Steps: Wash, rinse, dry face;Wash, rinse, dry hands;Oral care, brush teeth, clean dentures Grooming: 5: Set-up assist to obtain items FIM - Bathing Bathing Steps Patient Completed: Chest;Right Arm;Right lower leg (including foot);Left lower leg (including foot);Left Arm;Abdomen;Front perineal area;Buttocks;Right upper leg;Left upper leg Bathing: 4: Steadying assist FIM - Upper Body Dressing/Undressing Upper body dressing/undressing steps patient completed: Thread/unthread left sleeve of pullover shirt/dress;Thread/unthread right sleeve of pullover shirt/dresss;Pull shirt over trunk;Put head through opening of pull over shirt/dress Upper body dressing/undressing: 5: Set-up assist to: Obtain clothing/put away FIM - Lower Body Dressing/Undressing Lower body dressing/undressing steps patient completed: Thread/unthread right pants  leg;Thread/unthread left pants leg;Pull pants up/down;Don/Doff right sock;Don/Doff left sock Lower body dressing/undressing: 4: Steadying Assist FIM - Toileting Toileting steps completed by patient: Adjust clothing prior to toileting;Performs perineal hygiene;Adjust clothing after toileting Toileting: 4: Steadying assist FIM - Bed/Chair Transfer Bed/Chair Transfer: 4: Supine > Sit: Min A (steadying Pt. > 75%/lift 1 leg);4: Bed > Chair or W/C: Min A (steadying Pt. > 75%);4: Sit > Supine: Min A (steadying pt. > 75%/lift 1 leg);4: Chair or W/C > Bed: Min A (steadying Pt. > 75%) FIM - Radio producer Devices: Grab bars Toilet Transfers: 4-To toilet/BSC: Min A (steadying Pt. > 75%);4-From toilet/BSC: Min A (steadying Pt. > 75%) FIM - Systems developer Devices: Shower chair;Grab bars Tub/shower Transfers: 4-Into Tub/Shower: Min A (steadying Pt. > 75%/lift 1 leg);4-Out of Tub/Shower: Min A (steadying Pt. > 75%/lift 1 leg)   Refer to Care Plan for Long Term Goals  Recommendations for other services: None  Discharge Criteria: Patient will be discharged from OT if patient refuses treatment 3 consecutive times without medical reason, if treatment goals not met, if there is a change in medical status, if patient makes no progress towards goals or if patient is discharged from hospital.  The above assessment, treatment plan, treatment alternatives and goals were discussed and mutually agreed upon: by patient  Nicoletta Ba 11/19/2014, 8:27 AM

## 2014-11-19 NOTE — Progress Notes (Signed)
Recreational Therapy Assessment and Plan  Patient Details  Name: Martin Edwards MRN: [id number] Date of Birth: 23-Oct-1945 Today's Date: 11/19/2014  Rehab Potential: Good ELOS: 10 days   Assessment Clinical Impression:  Problem List:  Patient Active Problem List   Diagnosis Date Noted  . SDH (subdural hematoma) 11/18/2014  . Subdural hematoma 11/11/2014  . Small bowel obstruction due to adhesions 01/30/2014  . Sebaceous cyst x2 01/04/2014  . Prostate cancer 12/12/2012  . Hypertension 10/21/2011  . Hyperlipidemia 10/21/2011  . Adenomatous colon polyp 10/21/2011  . Postop check 12/29/2010    Past Medical History:  Past Medical History  Diagnosis Date  . Hypertension   . Hyperlipidemia   . Kidney stones     "passed them"  . Prostate cancer 1999  . Basal cell carcinoma of face X 2  . Small bowel obstruction due to adhesions 01/30/2014   Past Surgical History:  Past Surgical History  Procedure Laterality Date  . Prostatectomy  05/20/1998  . Colectomy  10/2010    "took out 8 inches of colon; wasn't cancer"  . Basal cell carcinoma excision  X 2    "off my face both times"  . Craniotomy Right 11/11/2014    Procedure: CRANIOTOMY HEMATOMA EVACUATION SUBDURAL; Surgeon: Leeroy Cha, MD; Location: [place] [facility]; Service: Neurosurgery; Laterality: Right;  . Craniotomy Right 11/13/2014    Procedure: Removal bone flap, insertion into the abdominal wall; Surgeon: Leeroy Cha, MD; Location: [place] [facility]; Service: Neurosurgery; Laterality: Right;    Assessment & Plan Clinical Impression: Patient is a 69 y.o. year old male with h/o HTN, prostate cancer, renal calculi; who slipped and fell while showering on 11/11/14. Patient with complaints of frontal HA with dizziness for a couple of weeks PTA. He reported being off balance with multiple recent falls. CT head done revealing large right  acute on chronic SDH with 1.5 cm right to left midline shift. He was taken to OR emergently for right frontotemporal parietal crani for evacuation of SDH. He developed lethary on 06/01 and CT head revealed recurrent SDH. He was taken to OR for craniectomy with placement of bone flap in abdomen and for evacuation with recurrent SDH. Post op developed frequent focal seizures consisting of left gaze deviation with facial and occasional arm twitching. Neurology consulted and recommended adding Keppra to dilantin as well as treatment of UTI. Seizures have resolved with improvement in mentation. Patient transferred to CIR on 11/18/2014 .      Pt presents with decreased activity tolerance, decreased functional mobility, decreased balance, left inattention, decreased attention, decreased problem solving, decreased memory, decreased safety, decreased awareness  Limiting pt's independence with leisure/community pursuits.   Leisure History/Participation Premorbid leisure interest/current participation: Medical laboratory scientific officer - Building control surveyor - Doctor, hospital - Travel (Comment);Crafts - Woodworking;Sports - Other (Comment) (cutting wood for [employer], plays tennis) Other Leisure Interests: Television;Movies;Reading Leisure Participation Style: With Family/Friends Awareness of Community Resources: Good-identify 3 post discharge leisure resources Psychosocial / Spiritual Patient agreeable to Pet Therapy: Yes Does patient have pets?: Yes Social interaction - Mood/Behavior: Cooperative Engineer, drilling for Education?: Yes Strengths/Weaknesses Patient Strengths/Abilities: Willingness to participate;Active premorbidly Patient weaknesses: Physical limitations TR Patient demonstrates impairments in the following area(s): Edema;Endurance;Motor;Pain;Safety  Plan Rec Therapy Plan Is patient appropriate for Therapeutic Recreation?: Yes Rehab Potential: Good Treatment times per week: Min  1 time per week >20 minutes Estimated Length of Stay: 10 days TR Treatment/Interventions: Adaptive equipment instruction;1:1 session;Balance/vestibular training;Functional mobility training;Community reintegration;Cognitive remediation/compensation;Patient/family education;Therapeutic activities;Recreation/leisure participation;Therapeutic exercise;UE/LE Coordination activities;Visual/perceptual remediation/compensation  Recommendations for other services: None  Discharge Criteria: Patient will be discharged from TR if patient refuses treatment 3 consecutive times without medical reason.  If treatment goals not met, if there is a change in medical status, if patient makes no progress towards goals or if patient is discharged from hospital.  The above assessment, treatment plan, treatment alternatives and goals were discussed and mutually agreed upon: by patient  [place] 11/19/2014, 3:55 PM

## 2014-11-19 NOTE — Progress Notes (Signed)
69 year old male with h/o HTN, prostate cancer, renal calculi; who slipped and fell while showering on 11/11/14. Patient with complaints of frontal HA with dizziness for a couple of weeks PTA. He reported being off balance with multiple recent falls. 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 He was taken to OR emergently for right frontotemporal parietal crani for evacuation of SDH. He developed lethary on 06/01 and CT head revealed recurrent SDH. He was taken to OR for craniectomy with placement of bone flap in abdomen and for evacuation with recurrent SDH. Post op developed frequent focal seizures consisting of left gaze deviation with facial and occasional arm twitching  Subjective/Complaints: Mild headache No seizures since rehabilitation admission Constipation  Patient wondering whether he needs to be here, "I can do this on my own" Review systems positive for constipation negative for bladder problems except has occasional leakage and urgency which he states he has had at home following his prostate cancer  Objective: Vital Signs: Blood pressure 156/69, pulse 78, temperature 98.4 F (36.9 C), temperature source Oral, resp. rate 18, SpO2 100 %. No results found. Results for orders placed or performed during the hospital encounter of 11/18/14 (from the past 72 hour(s))  Urinalysis, Routine w reflex microscopic (not at [facility])     Status: Abnormal   Collection Time: 11/18/14  5:12 PM  Result Value Ref Range   Color, Urine YELLOW YELLOW   APPearance CLEAR CLEAR   Specific Gravity, Urine 1.009 1.005 - 1.030   pH 7.0 5.0 - 8.0   Glucose, UA NEGATIVE NEGATIVE mg/dL   Hgb urine dipstick TRACE (A) NEGATIVE   Bilirubin Urine NEGATIVE NEGATIVE   Ketones, ur NEGATIVE NEGATIVE mg/dL   Protein, ur 30 (A) NEGATIVE mg/dL   Urobilinogen, UA 0.2 0.0 - 1.0 mg/dL   Nitrite NEGATIVE NEGATIVE   Leukocytes, UA NEGATIVE NEGATIVE  Urine  microscopic-add on     Status: None   Collection Time: 11/18/14  5:12 PM  Result Value Ref Range   Squamous Epithelial / LPF RARE RARE   RBC / HPF 0-2 <3 RBC/hpf   Bacteria, UA RARE RARE  Phenytoin level, total     Status: Abnormal   Collection Time: 11/19/14  6:20 AM  Result Value Ref Range   Phenytoin Lvl 6.7 (L) 10.0 - 20.0 ug/mL     HEENT: normal Cardio: RRR and no murmurs Resp: CTA B/L and unlabored GI: BS positive and nnontender nondistended Extremity:  Pulses positive and No Edema Skin:   Wound C/D/I and no drainage or erythemaright scalp as well as right lower abdominal quadrant Neuro: Alert/Oriented and Normal Motor, decreased sensation on the left side as well as decreased left hand fine motor Musc/Skel:  Other no pain with upper extremity or lower extremity range of motion Gen. no acute distress   Assessment/Plan: 1. 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 Physiatrist is providing close team supervision and 24 hour management of active medical problems listed below. Physiatrist and rehab team continue to assess barriers to discharge/monitor patient progress toward functional and medical goals. Team conference today please see physician documentation under team conference tab, met with team face-to-face to discuss problems,progress, and goals. Formulized individual treatment plan based on medical history, underlying problem and comorbidities. FIM: FIM -  Bathing Bathing Steps Patient Completed: Chest, Right Arm, Right lower leg (including foot), Left lower leg (including foot), Left Arm, Abdomen, Front perineal area, Buttocks, Right upper leg, Left upper leg Bathing: 4: Steadying assist  FIM - Upper Body Dressing/Undressing Upper body dressing/undressing steps  patient completed: Thread/unthread left sleeve of pullover shirt/dress, Thread/unthread right sleeve of pullover shirt/dresss, Pull shirt over trunk, Put head through opening of pull over shirt/dress Upper body dressing/undressing: 5: Set-up assist to: Obtain clothing/put away FIM - Lower Body Dressing/Undressing Lower body dressing/undressing steps patient completed: Thread/unthread right pants leg, Thread/unthread left pants leg, Pull pants up/down, Don/Doff right sock, Don/Doff left sock Lower body dressing/undressing: 4: Steadying Assist  FIM - Toileting Toileting steps completed by patient: Adjust clothing prior to toileting, Performs perineal hygiene, Adjust clothing after toileting Toileting: 4: Steadying assist  FIM - Radio producer Devices: Grab bars Toilet Transfers: 4-To toilet/BSC: Min A (steadying Pt. > 75%), 4-From toilet/BSC: Min A (steadying Pt. > 75%)  FIM - Bed/Chair Transfer Bed/Chair Transfer: 4: Supine > Sit: Min A (steadying Pt. > 75%/lift 1 leg), 4: Bed > Chair or W/C: Min A (steadying Pt. > 75%), 4: Sit > Supine: Min A (steadying pt. > 75%/lift 1 leg), 4: Chair or W/C > Bed: Min A (steadying Pt. > 75%)     Comprehension Comprehension Mode: Auditory Comprehension: 5-Follows basic conversation/direction: With no assist  Expression Expression Mode: Verbal Expression: 5-Expresses basic needs/ideas: With extra time/assistive device  Social Interaction Social Interaction: 6-Interacts appropriately with others with medication or extra time (anti-anxiety, antidepressant).  Problem Solving Problem Solving: 5-Solves basic 90% of the time/requires cueing < 10% of the time  Memory Memory: 4-Recognizes or recalls 75 - 89% of the time/requires cueing 10 - 24% of the time  Medical Problem List and Plan: 1. Functional deficits secondary to Traumatic brain injury with right subdural hematoma, sensory deficits, fine motor deficits and cognitive  deficits 2. DVT Prophylaxis/Anticoagulation: Mechanical: Sequential compression devices, below knee Bilateral lower extremities 3. Pain Management: Continue Vicodin prn as effective.  4. Mood: LCSW to follow for evaluation and support.  5. Neuropsych: This patient is capable of making decisions on his own behalf. 6. Skin/Wound Care: Routine pressure relief measure in am.  7. Fluids/Electrolytes/Nutrition: Monitor I/O. Check lytes in am.  8. New onset focal motor seizures: Continue dilantin and Keppra 1000 mg bid. Check dilantin level in am. 9. HTN: Will monitor BP every 8 hours. Continue Norvasc, HCTZ and lisinopril.  10. Hyponatremia: Likely dilutional due to IVF--is improving. Will recheck labs in am.  11. Hypokalemia: On HCTZ. Supplement for now.  12. Staph UTI: On Rocephin--PCN resistant change to Levaquin 5d course  LOS (Days) 1 A FACE TO FACE EVALUATION WAS PERFORMED  KIRSTEINS,ANDREW E 11/19/2014, 9:58 AM

## 2014-11-19 NOTE — Progress Notes (Signed)
Occupational Therapy Note  Patient Details  Name: Martin Edwards MRN: [id number] Date of Birth: 07-12-1945  Today's Date: 11/19/2014 OT Individual Time: 1300-1430 OT Individual Time Calculation (min): 90 min   Pt denied pain Individual Therapy  Pt resting in w/c with family present.  Pt amb with HHA to ADL apartment and engaged in functional amb with HHA for home mgmt tasks.  Pt transitioned to therapy gym and engaged in dynamic standing tasks reaching with LUE to obtain horseshoes and toss at target.  Pt required steady A for standing balance.  Pt joined by Insurance claims handler who participated in session.  Pt engaged in dynamic standing tasks tossing large ball and bouncing large ball.  Pt transitioned to walking while tossing ball to Recreational Therapist.  Pt remembered room number but required max verbal cues to locate directional signs and use appropriately to walk down correct hallway.  Pt passed room and continued to end of hallway.  Room was located on left while walking and patient did not look to left while attempting to locate room.  Pt returned to w/c in room and verbalized understanding to call staff with any needs. RN notified that patient was remaining in w/c with no QRB.   [name] [facility] 11/19/2014, 2:41 PM

## 2014-11-19 NOTE — Progress Notes (Signed)
Charlett Blake, MD Physician Addendum Physical Medicine and Rehabilitation Consult Note 11/12/2014 9:16 AM  Related encounter: ED to Hosp-Admission (Discharged) from 11/11/2014 in [place] ICU    Expand All Collapse All        Physical Medicine and Rehabilitation Consult Reason for Consult: Traumatic subdural hematoma Referring Physician: Dr. Joya Salm   HPI: Martin Edwards is a 69 y.o. right handed male with history of hypertension. Patient lives alone independent prior to admission and retired. Presented 11/11/2014 after a fall while showering. CT of the head showed a large right subdural hematoma with acute on chronic components. Underwent right frontotemporal parietal craniotomy evacuation of subdural hematoma 11/11/2014 per Dr. Joya Salm. Placed on Keppra for seizure prophylaxis. Maintain on a regular diet. Physical and occupational therapy evaluations are pending. M.D. has requested physical medicine rehabilitation consult.   Patient recalls falling in the shower, states he is retired.Loss of consciousness unknown Per nursing just received 2 hydrocodone Review of Systems  Gastrointestinal: Positive for constipation.  Musculoskeletal: Positive for myalgias and falls.  All other systems reviewed and are negative.  Past Medical History  Diagnosis Date  . Hypertension   . Hyperlipidemia   . Kidney stones     "passed them"  . Prostate cancer 1999  . Basal cell carcinoma of face X 2  . Small bowel obstruction due to adhesions 01/30/2014   Past Surgical History  Procedure Laterality Date  . Prostatectomy  05/20/1998  . Colectomy  10/2010    "took out 8 inches of colon; wasn't cancer"  . Basal cell carcinoma excision  X 2    "off my face both times"  . Craniotomy Right 11/11/2014    Procedure: CRANIOTOMY HEMATOMA EVACUATION SUBDURAL; Surgeon: Leeroy Cha, MD; Location: [facility]; Service:  Neurosurgery; Laterality: Right;   Family History  Problem Relation Age of Onset  . Cancer Father     Lung   Social History:  reports that he has never smoked. He has never used smokeless tobacco. He reports that he drinks alcohol. He reports that he does not use illicit drugs. Allergies: No Known Allergies Medications Prior to Admission  Medication Sig Dispense Refill  . amLODipine (NORVASC) 10 MG tablet Take 1 tablet (10 mg total) by mouth daily. 90 tablet 1  . atorvastatin (LIPITOR) 40 MG tablet Take 1 tablet (40 mg total) by mouth daily. 90 tablet 3  . hydrochlorothiazide (MICROZIDE) 12.5 MG capsule Take 1 capsule (12.5 mg total) by mouth daily. 90 capsule 3  . lisinopril (PRINIVIL,ZESTRIL) 40 MG tablet Take 1 tablet (40 mg total) by mouth daily. 90 tablet 1  . pravastatin (PRAVACHOL) 40 MG tablet Take 1 tablet (40 mg total) by mouth daily. 90 tablet 1    Home: Home Living Family/patient expects to be discharged to:: Private residence Living Arrangements: Alone  Functional History:   Functional Status:  Mobility:          ADL:    Cognition: Cognition Orientation Level: Oriented X4    Blood pressure 116/50, pulse 63, temperature 99.6 F (37.6 C), temperature source Oral, resp. rate 15, height 5\' 9"  (1.753 m), weight 79.6 kg (175 lb 7.8 oz), SpO2 95 %. Physical Exam  Constitutional: He is oriented to person, place, and time. He appears well-developed.  HENT:  Craniotomy site is dressed without drainage  Eyes: EOM are normal.  Neck: Normal range of motion. Neck supple. No thyromegaly present.  Cardiovascular: Normal rate and regular rhythm.  Respiratory:  Effort normal and breath sounds normal. No respiratory distress.  GI: Soft. Bowel sounds are normal. He exhibits no distension.  Neurological: He is alert and oriented to person, place, and time.  Follows commands. He could not recall the full event of his fall  He has  absent sensation to light touch in the left upper and left lower limb No evidence of diplopia on lateral gaze Motor strength is 4/5 bilateral deltoids, biceps, triceps, grip, hip flexor, knee extensor, ankle dorsi flexor and plantar flexor No evidence of abnormal tone Difficulty with fine motor finger to thumb opposition bilaterally.   Lab Results Last 24 Hours    No results found for this or any previous visit (from the past 24 hour(s)).    Imaging Results (Last 48 hours)    Ct Head (brain) Wo Contrast  11/11/2014 CLINICAL DATA: Headache for 2 weeks. Dizziness, weakness and unsteady gait for 2-3 days. Nausea and vomiting. EXAM: CT HEAD WITHOUT CONTRAST TECHNIQUE: Contiguous axial images were obtained from the base of the skull through the vertex without intravenous contrast. COMPARISON: None. FINDINGS: Large mixed density right subdural fluid collection with both acute and chronic blood products. This measures up to 2.6 cm in depth with associated mass effect and 1.5 cm right to left midline shift. Some layering is also noted about the tentorium. The basilar cisterns are patent. Bony calvarium is intact without fracture. Mild mucosal thickening of the ethmoid air cells. Mastoid air cells are well aerated. IMPRESSION: Large mixed density right subdural fluid collection with both acute and chronic blood products causing mass effect and 1.5 cm right to left midline shift. Critical Value/emergent results were called by telephone at the time of interpretation on 11/11/2014 at 4:24 am to Dr. Julianne Rice , who verbally acknowledged these results. Electronically Signed By: Jeb Levering M.D. 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 Does the need for close, 24 hr/day medical supervision in concert with the patient's rehab needs make it unreasonable for this  patient to be served in a less intensive setting? Yes 2. Co-Morbidities requiring supervision/potential complications: Hypertension 3. Due to bladder management, bowel management, safety, skin/wound care, disease management, medication administration, pain management and patient education, does the patient require 24 hr/day rehab nursing? Yes 4. Does the patient require coordinated care of a physician, rehab nurse, PT (1-2 hrs/day, 5 days/week), OT (1-2 hrs/day, 55 days/week) and SLP (0.5-1 hrs/day, 5 days/week) to address physical and functional deficits in the context of the above medical diagnosis(es)? Yes Addressing deficits in the following areas: balance, endurance, locomotion, strength, transferring, bowel/bladder control, bathing, dressing, feeding, grooming, toileting and cognition 5. Can the patient actively participate in an intensive therapy program of at least 3 hrs of therapy per day at least 5 days per week? Potentially 6. The potential for patient to make measurable gains while on inpatient rehab is good 7. Anticipated functional outcomes upon discharge from inpatient rehab are supervision with PT, supervision with OT, supervision with SLP. 8. Estimated rehab length of stay to reach the above functional goals is: 10-14 days 9. Does the patient have adequate social supports and living environment to accommodate these discharge functional goals? Potentially 10. Anticipated D/C setting: Home 11. Anticipated post D/C treatments: [place] therapy 12. Overall Rehab/Functional Prognosis: excellent  RECOMMENDATIONS: This patient's condition is appropriate for continued rehabilitative care in the following setting: CIR unless patient progresses to supervision level  while in acute care Patient has agreed to participate in recommended program. Potentially Note that insurance prior authorization may be required for reimbursement for recommended care.  Comment: Postoperative day #1 need to see PT  and OT evaluations    11/12/2014       Revision History     Date/Time User Provider Type Action   11/12/2014 4:52 PM Charlett Blake, MD Physician Addend   11/12/2014 4:50 PM Charlett Blake, MD Physician Sign   11/12/2014 9:24 AM Cathlyn Parsons, PA-C Physician Assistant [place]   View Details Report       Routing History     Date/Time From To Method   11/12/2014 4:52 PM Charlett Blake, MD Charlett Blake, MD In Basket   11/12/2014 4:52 PM Charlett Blake, MD Darlyne Russian, MD In [place]

## 2014-11-19 NOTE — Progress Notes (Signed)
Labs reveal further drop in sodium. Will d/c HCTZ. Increase K dur to tid x 2 days.  Will recheck labs in am and add FR if Na drops further.

## 2014-11-19 NOTE — Evaluation (Signed)
Physical Therapy Assessment and Plan  Patient Details  Name: Martin Edwards MRN: [id number] Date of Birth: [birth year]  PT Diagnosis: Abnormal posture, Abnormality of gait, Cognitive deficits, Coordination disorder and Muscle weakness Rehab Potential: Good ELOS: 7-10 days   Today's Date: 11/19/2014 PT Individual Time: 1749-4496 PT Individual Time Calculation (min): 67 min    Problem List:  Patient Active Problem List   Diagnosis Date Noted  . 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 Left-sided sensory deficit present 11/19/2014  . SDH (subdural hematoma) 11/18/2014  . Subdural hematoma 11/11/2014  . Small bowel obstruction due to adhesions 01/30/2014  . Sebaceous cyst x2 01/04/2014  . Prostate cancer 12/12/2012  . Hypertension 10/21/2011  . Hyperlipidemia 10/21/2011  . Adenomatous colon polyp 10/21/2011  . Postop check 12/29/2010    Past Medical History:  Past Medical History  Diagnosis Date  . Hypertension   . Hyperlipidemia   . Kidney stones     "passed them"  . Prostate cancer 1999  . Basal cell carcinoma of face X 2  . Small bowel obstruction due to adhesions 01/30/2014   Past Surgical History:  Past Surgical History  Procedure Laterality Date  . Prostatectomy  05/20/1998  . Colectomy  10/2010    "took out 8 inches of colon; wasn't cancer"  . Basal cell carcinoma excision  X 2    "off my face both times"  . Craniotomy Right 11/11/2014    Procedure: CRANIOTOMY HEMATOMA EVACUATION SUBDURAL;  Surgeon: Leeroy Cha, MD;  Location: [facility];  Service: Neurosurgery;  Laterality: Right;  . Craniotomy Right 11/13/2014    Procedure: Removal bone flap, insertion into the abdominal wall;  Surgeon: Leeroy Cha, MD;  Location: [facility];  Service: Neurosurgery;  Laterality: Right;    Assessment & Plan Clinical Impression: Patient is a 69 y.o. year old male with h/o HTN, prostate cancer, renal calculi; who slipped and fell while showering on 11/11/14. Patient with  complaints of frontal HA with dizziness for a couple of weeks PTA. He reported being off balance with multiple recent falls. 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 He was taken to OR emergently for right frontotemporal parietal crani for evacuation of SDH. He developed lethary on 06/01 and CT head revealed recurrent SDH. He was taken to OR for craniectomy with placement of bone flap in abdomen and for evacuation with recurrent SDH. Post op developed frequent focal seizures consisting of left gaze deviation with facial and occasional arm twitching. Neurology consulted and recommended adding Keppra to dilantin as well as treatment of UTI. Seizures have resolved with improvement in mentation. Patient transferred to CIR on 11/18/2014 .   Patient currently requires min with mobility secondary to impaired timing and sequencing, abnormal tone and decreased coordination, decreased visual perceptual skills and decreased awareness, decreased memory and delayed processing.  Prior to hospitalization, patient was independent  with mobility and lived alone   in a House home.  Home access is 3Stairs to enter.  Patient will benefit from skilled PT intervention to maximize safe functional mobility and minimize fall risk for planned discharge home with intermittent assist versus 24 hour assist depending on cognitive improvements.  Anticipate patient will benefit from follow up OP at discharge.  PT - End of Session Endurance Deficit: Yes PT Assessment Rehab Potential (ACUTE/IP ONLY): Good Barriers to Discharge: Decreased caregiver support PT Patient demonstrates impairments in the following area(s): Balance;Endurance;Safety;Other (comment) (awareness) PT Transfers  Functional Problem(s): Floor;Furniture;Bed to Chair;Car PT Locomotion Functional Problem(s): Ambulation;Stairs PT Plan PT Intensity: Minimum of 1-2 x/day ,45 to 90 minutes PT Frequency: 5 out of 7  days. PT Duration Estimated Length of Stay: 7-10 days PT Treatment/Interventions: Ambulation/gait training;Cognitive remediation/compensation;Balance/vestibular training;Community reintegration;Discharge planning;DME/adaptive equipment instruction;Functional mobility training;Therapeutic Activities;UE/LE Strength taining/ROM;Visual/perceptual remediation/compensation;UE/LE Coordination activities;Therapeutic Exercise;Stair training;Patient/family education;Neuromuscular re-education PT Transfers Anticipated Outcome(s): independent PT Locomotion Anticipated Outcome(s): independent (intermittent daily supervision for safety) PT Recommendation Follow Up Recommendations: Outpatient PT Patient destination: Home Equipment Recommended: None recommended by PT  Skilled Therapeutic Intervention Patient seen for focus on gait endurance, safety and functional transfers.  Performed both furniture, car and bed transfers with minguard to supervision assist.  Educated in postural awareness without evidence of carryover.  Patient requiring max verbal cues and occasional tactile cues for left side awareness. Patient negotiated 12 stairs with minguard assist except one episode near fall requiring mod assist recovery due to poor foot placement on top stair due to decreased coordination left LE.  Ambulated over 300' controlled environment with minguard to supervision assist and cues for left side awareness. Patient assisted back to room in recliner all needs in reach.  RN aware as pt without quick release belt at this time.  PT Evaluation Precautions/Restrictions Precautions Precautions: Fall Precaution Comments: L inattention improving; helmet when OOB General   Vital Signs Pain Pain Assessment Pain Assessment: No/denies pain Home Living/Prior Functioning Home Living Available Help at Discharge: Family;Available PRN/intermittently;Other (Comment) Type of Home: House Home Access: Stairs to enter [employer] of Steps: 3 Entrance Stairs-Rails: None Home Layout: One level Prior Function Level of Independence: Independent with homemaking with ambulation;Independent with gait;Independent with basic ADLs;Independent with transfers  Able to Take Stairs?: Yes Driving: Yes Vocation: Retired Leisure: Hobbies-yes (Comment) Comments: tennis Vision/Perception  Vision - Assessment Alignment/Gaze Preference: Head tilt (to right)  Cognition Overall Cognitive Status: Impaired/Different from baseline Arousal/Alertness: Awake/alert Orientation Level: Oriented X4 Attention: Selective Selective Attention: Impaired Memory: Impaired Memory Impairment: Decreased recall of new information;Decreased short term memory Awareness: Impaired Awareness Impairment: Anticipatory impairment Problem Solving: Impaired Problem Solving Impairment: Functional complex Safety/Judgment: Impaired [employer] Scales of Cognitive Functioning: Purposeful/appropriate Sensation Sensation Light Touch: Appears Intact Proprioception: Appears Intact Coordination Heel Shin Test: WNL left to right, but impaired with toe tapping left slower and poor sequencing and timing Motor  Motor Motor: Abnormal postural alignment and control Motor - Skilled Clinical Observations: generalized weakness, foward head, rounded shoulders, flexed hips, flattened lordosis, head tilt to right  Mobility Bed Mobility Bed Mobility: Supine to Sit;Sit to Supine Supine to Sit: 5: Supervision Supine to Sit Details: Verbal cues for precautions/safety;Verbal cues for technique Sit to Supine: 5: Supervision Sit to Supine - Details: Verbal cues for precautions/safety;Verbal cues for technique Transfers Transfers: Yes Sit to Stand: 4: Min guard Stand to Sit: 4: Min guard Locomotion  Ambulation Ambulation: Yes Ambulation/Gait Assistance: 4: Min assist Ambulation Distance (Feet): 300 Feet Assistive device: None Ambulation/Gait  Assistance Details: Tactile cues for posture;Tactile cues for weight shifting;Verbal cues for precautions/safety Ambulation/Gait Assistance Details: increased lateral sway, slower with negotiating in smaller space or for turns, decreased left hemipelvic movement with ankle/foot stiffness throughout gait cycle, decreased left side awareness thoughout maneuvering in gym, hall and to find car in small gym Gait Gait velocity: 1.37 ft/sec Stairs / Additional Locomotion Stairs: Yes Stairs Assistance: 3: Mod assist Stairs Assistance Details: Verbal cues for precautions/safety Stairs Assistance Details (indicate cue type and reason): one episode requiring more than min assist when  right foot almost slipped off edge of step when ascending/turining around to start descent due to left foot stuck on non slip surface of stairs Stair Management Technique: Two rails;Alternating pattern;Forwards Number of Stairs: 12  Trunk/Postural Assessment  Cervical Assessment Cervical Assessment: Exceptions to [employer] (head tilted down and to right) Lumbar Assessment Lumbar Assessment: Exceptions to [facility] (flattened lordosis) Postural Control Postural Control: Deficits on evaluation Righting Reactions: delayed  Balance Balance Balance Assessed: Yes Standardized Balance Assessment Standardized Balance Assessment: Berg Balance Test Berg Balance Test Sit to Stand: Able to stand  independently using hands Standing Unsupported: Able to stand safely 2 minutes Sitting with Back Unsupported but Feet Supported on Floor or Stool: Able to sit safely and securely 2 minutes Stand to Sit: Sits safely with minimal use of hands Transfers: Able to transfer safely, minor use of hands Standing Unsupported with Eyes Closed: Able to stand 10 seconds safely Standing Ubsupported with Feet Together: Able to place feet together independently and stand 1 minute safely From Standing, Reach Forward with Outstretched Arm: Can reach forward >12 cm  safely (5") From Standing Position, Pick up Object from Floor: Able to pick up shoe, needs supervision From Standing Position, Turn to Look Behind Over each Shoulder: Turn sideways only but maintains balance Turn 360 Degrees: Needs close supervision or verbal cueing Standing Unsupported, Alternately Place Feet on Step/Stool: Able to complete >2 steps/needs minimal assist Standing Unsupported, One Foot in Front: Able to plae foot ahead of the other independently and hold 30 seconds Standing on One Leg: Tries to lift leg/unable to hold 3 seconds but remains standing independently Total Score: 41 Static Standing Balance Static Standing - Level of Assistance: 5: Stand by assistance Static Standing - Comment/# of Minutes: 2 Extremity Assessment      RLE Assessment RLE Assessment: Within Functional Limits LLE Assessment LLE Assessment: Exceptions to WFL LLE AROM (degrees) Left Ankle Dorsiflexion:  (at least 20 degrees from neutral) LLE Strength LLE Overall Strength: Deficits (grossly 4/5) LLE Tone LLE Tone: Hypertonic;Mild (in ankle/foot)  FIM:  FIM - Bed/Chair Transfer Bed/Chair Transfer: 5: Supine > Sit: Supervision (verbal cues/safety issues);5: Sit > Supine: Supervision (verbal cues/safety issues);4: Bed > Chair or W/C: Min A (steadying Pt. > 75%);4: Chair or W/C > Bed: Min A (steadying Pt. > 75%) FIM - Locomotion: Wheelchair Locomotion: Wheelchair: 0: Activity did not occur FIM - Locomotion: Ambulation Locomotion: Ambulation Assistive Devices: Other (comment) (no device; 300') Ambulation/Gait Assistance: 4: Min assist Locomotion: Ambulation: 4: Travels 150 ft or more with minimal assistance (Pt.>75%) FIM - Locomotion: Stairs Locomotion: Scientist, physiological: Hand rail - 2 Locomotion: Stairs: 3: Up and Down 12 - 14 stairs with moderate assistance (Pt: 50 - 74%)   Refer to Care Plan for Long Term Goals  Recommendations for other services: None  Discharge Criteria: Patient  will be discharged from PT if patient refuses treatment 3 consecutive times without medical reason, if treatment goals not met, if there is a change in medical status, if patient makes no progress towards goals or if patient is discharged from hospital.  The above assessment, treatment plan, treatment alternatives and goals were discussed and mutually agreed upon: by patient  [facility] 11/19/2014, 12:31 PM  [place], [place] 11/19/2014

## 2014-11-19 NOTE — Evaluation (Signed)
Speech Language Pathology Assessment and Plan  Patient Details  Name: Martin Edwards MRN: [id number] Date of Birth: 1945-07-11  SLP Diagnosis: Cognitive Impairments  Rehab Potential: Excellent ELOS: 10-12 days    Today's Date: 11/19/2014 SLP Individual Time: 1000-1100 SLP Individual Time Calculation (min): 60 min   Problem List:  Patient Active Problem List   Diagnosis Date Noted  . 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 Left-sided sensory deficit present 11/19/2014  . SDH (subdural hematoma) 11/18/2014  . Subdural hematoma 11/11/2014  . Small bowel obstruction due to adhesions 01/30/2014  . Sebaceous cyst x2 01/04/2014  . Prostate cancer 12/12/2012  . Hypertension 10/21/2011  . Hyperlipidemia 10/21/2011  . Adenomatous colon polyp 10/21/2011  . Postop check 12/29/2010   Past Medical History:  Past Medical History  Diagnosis Date  . Hypertension   . Hyperlipidemia   . Kidney stones     "passed them"  . Prostate cancer 1999  . Basal cell carcinoma of face X 2  . Small bowel obstruction due to adhesions 01/30/2014   Past Surgical History:  Past Surgical History  Procedure Laterality Date  . Prostatectomy  05/20/1998  . Colectomy  10/2010    "took out 8 inches of colon; wasn't cancer"  . Basal cell carcinoma excision  X 2    "off my face both times"  . Craniotomy Right 11/11/2014    Procedure: CRANIOTOMY HEMATOMA EVACUATION SUBDURAL;  Surgeon: Leeroy Cha, MD;  Location: [place] [facility];  Service: Neurosurgery;  Laterality: Right;  . Craniotomy Right 11/13/2014    Procedure: Removal bone flap, insertion into the abdominal wall;  Surgeon: Leeroy Cha, MD;  Location: [place] [facility];  Service: Neurosurgery;  Laterality: Right;    Assessment / Plan / Recommendation Clinical Impression Patient is a 69 year old male with h/o HTN, prostate cancer, renal calculi; who slipped and fell while showering on 11/11/14. Patient with complaints of frontal HA with dizziness for a couple of  weeks PTA. He reported being off balance with multiple recent falls.  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 He was taken to OR emergently for right frontotemporal parietal crani for evacuation of SDH.  He developed lethargy on 06/01 and CT head revealed recurrent SDH. He was taken to OR for craniectomy with placement of bone flap in abdomen and for evacuation with recurrent SDH. Post op developed frequent focal seizures consisting of left gaze deviation with facial and occasional arm twitching.  Neurology consulted and recommended adding Keppra to Dilantin as well as treatment of UTI.  Seizures have resolved with improvement in mentation. Therapy initiated and patient continues to be limited by left inattention and unsteady gait. CIR recommended by MD and Rehab team and patient was admitted 11/18/14. Patient was administered a cognitive-linguistic evaluation and demonstrates behaviors consistent with a Rancho Level VIII and demonstrates impaired selective attention, attention to left field of environment, functional problem solving, emergent awareness and safety awareness. Patient was also administered a BSE and consumed thin liquids via straw without overt s/s of aspiration. Patient also demonstrated timely oral transit of bolus, full clearance of oral cavity, and no overt indication of aspiration with regular textures, therefore, recommend patient continue current diet of regular textures with thin liquids. SLP will follow-up for tolerance briefly. Patient would benefit from skilled SLP intervention to maximize cognitive function and overall functional independence prior to discharge.   Skilled Therapeutic Interventions  Administered a cognitive-linguistic evaluation and BSE. Please see above for details. Patient also administered the MoCA and scored a 22/30 with a score of 26 or above considered normal. Patient educated on his current cognitive  function and goals of skilled SLP intervention, he verbalized understanding.   SLP Assessment  Patient will need skilled Speech Lanaguage Pathology Services during CIR admission    Recommendations  SLP Diet Recommendations: Age appropriate regular solids;Thin Liquid Administration via: Cup;Straw Medication Administration: Whole meds with liquid Supervision: Patient able to self feed;Intermittent supervision to cue for compensatory strategies Compensations: Follow solids with liquid;Slow rate;Small sips/bites;Check for pocketing Postural Changes and/or Swallow Maneuvers: Seated upright 90 degrees Oral Care Recommendations: Oral care BID Recommendations for Other Services: Neuropsych consult Patient destination: Home Follow up Recommendations: Outpatient SLP;24 hour supervision/assistance Equipment Recommended: None recommended by SLP    SLP Frequency 3 to 5 out of 7 days   SLP Treatment/Interventions Cognitive remediation/compensation;Cueing hierarchy;Functional tasks;Environmental controls;Dysphagia/aspiration precaution training;Patient/family education;Therapeutic Activities;Internal/external aids    Pain No/Denies Pain  Short Term Goals: Week 1: SLP Short Term Goal 1 (Week 1): Patient will demonstrate functional problem solving for mildly complex tasks with Min A verbal cues.  SLP Short Term Goal 2 (Week 1): Patient will self-monitor and correct errors during functional tasks with Min A question and verbal cues.  SLP Short Term Goal 3 (Week 1): Patient will demonstrate selective attention in a moderately distracting enviornment for 60 minutes with Min A verbal cues.  SLP Short Term Goal 4 (Week 1): Patient will attend to left field of envoirnment during functional tasks with supervision verbal cues.  SLP Short Term Goal 5 (Week 1): Patient will consume current diet without overt s/s of aspiration with Mod I for use of strategies.   See FIM for current functional status Refer to Care  Plan for Long Term Goals  Recommendations for other services: Neuropsych  Discharge Criteria: Patient will be discharged from SLP if patient refuses treatment 3 consecutive times without medical reason, if treatment goals not met, if there is a change in medical status, if patient makes no progress towards goals or if patient is discharged from hospital.  The above assessment, treatment plan, treatment alternatives and goals were discussed and mutually agreed upon: by patient  Anjoli Diemer 11/19/2014, 2:52 PM

## 2014-11-19 NOTE — Progress Notes (Signed)
Retta Diones, RN Rehab Admission Coordinator Signed Physical Medicine and Rehabilitation PMR Pre-admission 11/18/2014 1:11 PM  Related encounter: ED to Hosp-Admission (Discharged) from 11/11/2014 in [facility] ICU    Expand All Collapse All   PMR Admission Coordinator Pre-Admission Assessment  Patient: Martin Edwards is an 69 y.o., male MRN: [id number] DOB: 06/13/1946 Height: 5\' 9"  (175.3 cm) Weight: 79.6 kg (175 lb 7.8 oz)  Insurance Information HMO: Yes PPO: PCP: IPA: 80/20: OTHER: Group # [id number] PRIMARY: [employer] Medicare Policy#: [id number] Subscriber: Myrene Buddy CM Name: Geryl Rankins Phone#: [phone] Fax#:  Pre-Cert#: [id number] X 7 days Employer: Retired Benefits: Phone #: [phone] Name: Automated Eff. 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 Patient with complaints of frontal HA with dizziness for a couple of weeks PTA. He reported being off balance  with multiple recent falls. 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 He was taken to OR emergently for right frontotemporal parietal crani for evacuation of SDH. He developed lethary on 06/01 and CT head revealed recurrent SDH. He was taken to OR for craniectomy with placement of bone flap in abdomen and for evacuation with recurrent SDH. Post op developed frequent focal seizures consisting of left gaze deviation with facial and occasional arm twitching. Neurology consulted and recommended adding Keppra to dilantin as well as treatment of UTI. Seizures have resolved with improvement in mentation. Therapy initiated and patient continues to be limited by left inattention and unsteady gait. CIR recommended by MD and Rehab team. Patient cleared medically for admission today.   Total: 4=NIH  Past Medical History  Past Medical History  Diagnosis Date  . Hypertension   . Hyperlipidemia   . Kidney stones     "passed them"  . Prostate cancer 1999  . Basal cell carcinoma of face X 2  . Small bowel obstruction due to adhesions 01/30/2014    Family History  family history includes Cancer in his father.  Prior Rehab/Hospitalizations:  Has the patient had major surgery during 100 days prior to admission? No  Current Medications   Current facility-administered medications:  . 0.9 % sodium chloride infusion, , Intravenous, Once, Leeroy Cha, MD . 0.9 % sodium chloride infusion, , Intravenous, Continuous, Leeroy Cha, MD, Stopped at 11/13/14 1750 . 0.9 % sodium chloride infusion, , Intravenous, Continuous, Leeroy Cha, MD, Last Rate: 75 mL/hr at 11/16/14 2014 . acetaminophen (TYLENOL) suppository 650 mg, 650 mg, Rectal, Q4H PRN, Leeroy Cha, MD, 650 mg at 11/13/14 1336 . acetaminophen (TYLENOL) tablet 650 mg, 650 mg, Oral, Q4H PRN, Leeroy Cha, MD, 650 mg at 11/16/14 1242 . amLODipine (NORVASC)  tablet 10 mg, 10 mg, Oral, Daily, Leeroy Cha, MD, 10  mg at 11/18/14 1142 . atorvastatin (LIPITOR) tablet 40 mg, 40 mg, Oral, q1800, Leeroy Cha, MD, 40 mg at 11/17/14 1838 . cefTRIAXone (ROCEPHIN) 2 g in dextrose 5 % 50 mL IVPB - Premix, 2 g, Intravenous, Q24H, Consuella Lose, MD, 2 g at 11/17/14 2022 . hydrochlorothiazide (MICROZIDE) capsule 12.5 mg, 12.5 mg, Oral, Daily, Leeroy Cha, MD, 12.5 mg at 11/18/14 1142 . HYDROcodone-acetaminophen (NORCO/VICODIN) 5-325 MG per tablet 1-2 tablet, 1-2 tablet, Oral, Q4H PRN, Leeroy Cha, MD, 2 tablet at 11/17/14 2056 . labetalol (NORMODYNE,TRANDATE) injection 10-40 mg, 10-40 mg, Intravenous, Q10 min PRN, Leeroy Cha, MD . levETIRAcetam (KEPPRA) 1,000 mg in sodium chloride 0.9 % 100 mL IVPB, 1,000 mg, Intravenous, Q12H, Greta Doom, MD, 1,000 mg at 11/18/14 1141 . lisinopril (PRINIVIL,ZESTRIL) tablet 40 mg, 40 mg, Oral, Daily, Leeroy Cha, MD, 40 mg at 11/18/14 1141 . morphine 2 MG/ML injection 1-2 mg, 1-2 mg, Intravenous, Q2H PRN, Leeroy Cha, MD . naloxone [facility]) injection 0.08 mg, 0.08 mg, Intravenous, PRN, Leeroy Cha, MD . ondansetron [facility]) tablet 4 mg, 4 mg, Oral, Q4H PRN **OR** ondansetron (ZOFRAN) injection 4 mg, 4 mg, Intravenous, Q4H PRN, Leeroy Cha, MD . pantoprazole (PROTONIX) EC tablet 40 mg, 40 mg, Oral, QHS, Leeroy Cha, MD, 40 mg at 11/17/14 2159 . phenytoin (DILANTIN) injection 100 mg, 100 mg, Intravenous, 3 times per day, Greta Doom, MD, 100 mg at 11/18/14 0512 . promethazine (PHENERGAN) tablet 12.5-25 mg, 12.5-25 mg, Oral, Q4H PRN, Leeroy Cha, MD . senna [facility]) tablet 8.6 mg, 1 tablet, Oral, BID, Consuella Lose, MD, 8.6 mg at 11/18/14 1142  Patients Current Diet: Diet regular Room service appropriate?: Yes; Fluid consistency:: Thin  Precautions / Restrictions Precautions Precautions: Fall Precaution Comments: L inattention improving Restrictions Weight  Bearing Restrictions: No   Has the patient had 2 or more falls or a fall with injury in the past year?Yes. Patient fell on 11/11/14 while in his shower at home. Then was admitted to hospital for TBI and SDH.  Prior Activity Level Community (5-7x/wk): Went out daily, was driving. Played tennis doubles. Reitred from [place] / [place] Devices/Equipment: None Home Equipment: Grab bars - tub/shower  Prior Device Use: Indicate devices/aids used by the patient prior to current illness, exacerbation or injury? None of the aboveDid not use any device PTA. Was completely independent.  Prior Functional Level Prior Function Level of Independence: Independent  Self Care: Did the patient need help bathing, dressing, using the toilet or eating? Independent  Indoor Mobility: Did the patient need assistance with walking from room to room (with or without device)? Independent  Stairs: Did the patient need assistance with internal or external stairs (with or without device)? Independent  Functional Cognition: Did the patient need help planning regular tasks such as shopping or remembering to take medications? Independent  Current Functional Level Cognition  Overall Cognitive Status: Impaired/Different from baseline Current Attention Level: Sustained Orientation Level: Oriented X4 Following Commands: Follows one step commands consistently Safety/Judgement: Decreased awareness of deficits, Decreased awareness of safety General Comments: Pt fluctuated between wakefulness and lethargy. Slow to respond at times. Lt inattention, but able to attend to Lt with min verbal cues. Pt asking same questions repetitively at times    Extremity Assessment (includes Sensation/Coordination)  Upper Extremity Assessment: LUE deficits/detail LUE Deficits / Details: no active wrist extension or finger movement noted. Did note shoulder flexion and elbow  flexion/ extension LUE Sensation: decreased proprioception LUE Coordination: decreased fine motor, decreased  gross motor  Lower Extremity Assessment: Generalized weakness (or slow recruitment of muscles or inattension)    ADLs  Overall ADL's : Needs assistance/impaired Grooming: Wash/dry hands, Wash/dry face, Moderate assistance, Sitting Grooming Details (indicate cue type and reason): mod verbla cues  Toilet Transfer: Moderate assistance, +2 for physical assistance, Stand-pivot, BSC Toileting- Clothing Manipulation and Hygiene: Total assistance, Sit to/from stand Functional mobility during ADLs: Moderate assistance, +2 for physical assistance General ADL Comments: Pt with focal seizures throughout the day. Pt loaded on Keppra and Ativan per RN. Family present and instructed in activites to drive attention to the Lt     Mobility  Overal bed mobility: Needs Assistance Bed Mobility: Supine to Sit Supine to sit: Supervision General bed mobility comments: Able to don socks at EOB with modified independence    Transfers  Overall transfer level: Needs assistance Equipment used: None Transfers: Sit to/from Stand, Stand Pivot Transfers Sit to Stand: Min guard Stand pivot transfers: Min assist General transfer comment: Pt required use of hands for transfers    Ambulation / Gait / Stairs / Wheelchair Mobility  Ambulation/Gait Ambulation/Gait assistance: Museum/gallery curator (Feet): 200 Feet Assistive device: None General Gait Details: Gait slow; unable to vary speed changes; rigid, kyphotic posture; left lateral weight shift during left stance, "waddling" appearance. Pt was able shift gaze from L to R but noted increased difficulty Gait Pattern/deviations: Step-through pattern, Decreased stride length, Trunk flexed, Drifts right/left Gait velocity: Slow Gait velocity interpretation: Below normal speed for age/gender    Posture / Balance Dynamic Sitting  Balance Sitting balance - Comments: listing L and posteriorly into post tilt with head down. Pt able to be cued to sit more upright with head up, but unable to hold without repetitive cuing. Balance Overall balance assessment: Needs assistance Sitting-balance support: No upper extremity supported (Min guard for sitting) Sitting balance-Leahy Scale: Poor Sitting balance - Comments: listing L and posteriorly into post tilt with head down. Pt able to be cued to sit more upright with head up, but unable to hold without repetitive cuing. Standing balance support: Bilateral upper extremity supported Standing balance-Leahy Scale: Poor Standing balance comment: stood for 1-2 min with graded assist/support of L knee, trunk. Working on pt attaining upright midline balance, but pt did need w/shift assist and trucal assist to facilitate coming forward. Standardized Balance Assessment Standardized Balance Assessment : Berg Balance Test Berg Balance Test Sit to Stand: Able to stand independently using hands Standing Unsupported: Able to stand 30 seconds unsupported Sitting with Back Unsupported but Feet Supported on Floor or Stool: Able to sit safely and securely 2 minutes Stand to Sit: Uses backs of legs against chair to control descent Transfers: Able to transfer with verbal cueing and /or supervision Standing Unsupported with Eyes Closed: Able to stand 3 seconds Standing Ubsupported with Feet Together: Needs help to attain position but able to stand for 30 seconds with feet together From Standing, Reach Forward with Outstretched Arm: Reaches forward but needs supervision From Standing Position, Pick up Object from Floor: Unable to pick up shoe, but reaches 2-5 cm (1-2") from shoe and balances independently From Standing Position, Turn to Look Behind Over each Shoulder: Looks behind one side only/other side shows less weight shift Turn 360 Degrees: Needs close supervision or verbal cueing Standing  Unsupported, Alternately Place Feet on Step/Stool: Able to complete >2 steps/needs minimal assist Standing Unsupported, One Foot in Front: Able to take small step independently and hold 30 seconds Standing on One Leg:  Tries to lift leg/unable to hold 3 seconds but remains standing independently Total Score: 27    Special needs/care consideration BiPAP/CPAP No CPM No Continuous Drip IV 0.9% NS at 75 ml/hr Dialysis No  Life Vest No Oxygen No Special Bed No Trach Size No Wound Vac (area) No  Skin Has stapled incision right craniotomy incision  Bowel mgmt: Last BM 11/17/14 Bladder mgmt: Catheter removed 11/17/14 and has voided without incontinence Diabetic mgmt No    Previous Home Environment Living Arrangements: Alone Available Help at Discharge: Family, Available PRN/intermittently, Other (Comment) (son/brother state they will try to come up with more help) Type of Home: House Home Layout: One level Home Access: Stairs to enter [place]: No  Discharge Living Setting Plans for Discharge Living Setting: Patient's home, Alone, House (Brother can stay with patient.) Type of Home at Discharge: House Discharge Home Layout: One level Discharge Home Access: Stairs to enter Entrance Stairs-Number of Steps: 3-4 steps up to porch entry Does the patient have any problems obtaining your medications?: No  Social/Family/Support Systems Patient Roles: Parent, Other (Comment) (Has a brother, son and a daughter.) Contact Information: Kento Gossman - son [phone] Anticipated Caregiver: Jaydenn Boccio - brother Anticipated Caregiver's Contact Information: Christiane Ha - brother [phone] Ability/Limitations of Caregiver: Brother is retired and can assist. Son works. Dtr lives in [place]. Caregiver Availability: 24/7 Discharge Plan Discussed with Primary Caregiver: Yes Is Caregiver In Agreement with Plan?: Yes Does Caregiver/Family have Issues  with Lodging/Transportation while Pt is in Rehab?: No  Goals/Additional Needs Patient/Family Goal for Rehab: PT/OT/ST supervision goals Expected length of stay: 10-14 days Cultural Considerations: [employer] Dietary Needs: Regular diet, thin liquids Equipment Needs: TBD Pt/Family Agrees to Admission and willing to participate: Yes Program Orientation Provided & Reviewed with Pt/Caregiver Including Roles & Responsibilities: Yes  Decrease burden of Care through IP rehab admission: N/A  Possible need for SNF placement upon discharge: Not anticipated  Patient Condition: This patient's medical and functional status has changed since the consult dated: 11/12/14 in which the Rehabilitation Physician determined and documented that the patient's condition is appropriate for intensive rehabilitative care in an inpatient rehabilitation facility. See "History of Present Illness" (above) for medical update. Functional changes are: Currently requiring min assist to ambulate 200 ft with no device. Patient's medical and functional status update has been discussed with the Rehabilitation physician and patient remains appropriate for inpatient rehabilitation. Will admit to inpatient rehab today.  Preadmission Screen Completed By: Retta Diones, 11/18/2014 1:32 PM ______________________________________________________________________  Discussed status with Dr. Letta Pate on 11/18/14 at 1332 and received telephone approval for admission today.  Admission Coordinator: Retta Diones, time1332/Date06/06/16          Cosigned by: Charlett Blake, MD at 11/18/2014 1:38 PM  Revision History     Date/Time User Provider Type Action   11/18/2014 1:38 PM Charlett Blake, MD Physician Cosign   11/18/2014 1:33 PM Retta Diones, RN Rehab Admission Coordinator Sign

## 2014-11-20 ENCOUNTER — Inpatient Hospital Stay (HOSPITAL_COMMUNITY): Payer: Medicare Other | Admitting: Speech Pathology

## 2014-11-20 ENCOUNTER — Inpatient Hospital Stay (HOSPITAL_COMMUNITY): Payer: Medicare Other | Admitting: Physical Therapy

## 2014-11-20 ENCOUNTER — Inpatient Hospital Stay (HOSPITAL_COMMUNITY): Payer: Medicare Other

## 2014-11-20 LAB — URINE CULTURE
Colony Count: NO GROWTH
Culture: NO GROWTH

## 2014-11-20 LAB — BASIC METABOLIC PANEL
Anion gap: 13 (ref 5–15)
BUN: 9 mg/dL (ref 6–20)
CALCIUM: 9.2 mg/dL (ref 8.9–10.3)
CHLORIDE: 89 mmol/L — AB (ref 101–111)
CO2: 29 mmol/L (ref 22–32)
Creatinine, Ser: 0.88 mg/dL (ref 0.61–1.24)
GFR calc non Af Amer: >60 mL/min (ref >60)
Glucose, Bld: 126 mg/dL — ABNORMAL HIGH (ref 65–99)
Potassium: 3.7 mmol/L (ref 3.5–5.1)
Sodium: 131 mmol/L — ABNORMAL LOW (ref 135–145)

## 2014-11-20 LAB — CULTURE, BLOOD (ROUTINE X 2)
Culture: NO GROWTH
Culture: NO GROWTH

## 2014-11-20 NOTE — Progress Notes (Signed)
Occupational Therapy Note  Patient Details  Name: Martin Edwards MRN: [id number] Date of Birth: 04/27/1946  Today's Date: 11/20/2014 OT Individual Time: 1300-1430 OT Individual Time Calculation (min): 90 min   Pt denied pain Individual Therapy  Pt resting in recliner upon arrival; son present.  Discussed pt's progress and goals with son.  Also discussed recommendation for 24 hour supervision after discharge. Pt's son verbalized understanding.  Pt engaged in functional amb with path finding.  Pt utilized hospital signage (min verbal cues) to locate solarium and main entrance to hospital.  Pt also required to return to room using signage.  Pt required min verbal cues to continue to use signs appropriately.  Pt did not require any physical assistance and no LOB observed.  Pt requested use of bathroom and located public bathroom with supervision.   [name] [facility] 11/20/2014, 2:38 PM

## 2014-11-20 NOTE — Patient Care Conference (Signed)
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 Left-sided sensory deficit present 11/19/2014  . SDH (subdural hematoma) 11/18/2014  . Subdural hematoma 11/11/2014  . Small bowel obstruction due to adhesions 01/30/2014  . Sebaceous cyst x2 01/04/2014  . Prostate cancer 12/12/2012  . Hypertension 10/21/2011  . Hyperlipidemia 10/21/2011  . Adenomatous colon polyp 10/21/2011  . Postop check 12/29/2010    Expected Discharge Date: Expected Discharge Date: 11/28/14  Team Members Present: Physician leading conference: Dr. Alysia Penna Social Worker Present: Lennart Pall, LCSW Nurse Present: Heather Roberts, RN PT Present: Carney Living, PT OT Present: Roanna Epley, Griffin Basil, OT SLP Present: Weston Anna, SLP PPS Coordinator present : Daiva Nakayama, RN, CRRN     Current Status/Progress Goal Weekly Team Focus  Medical   mild left inattention, safety awareness, helmet is too large, doing well with swallowing  Sup/Mod I level  Pt ed on safety   Bowel/Bladder   continent of bowel and bladder. LBM 6-7  manage bowel and bladder mod i  contiune POC   Swallow/Nutrition/ Hydration   Regular textures with thin liquids, Intermittent supervision  Mod I  tolerance of current diet    ADL's   overall min A   mod I   functional problem solving,  standing balance, attention, left attention in functional activities   Mobility   supervision  mod I except supervision community ambulation  standing balance, attention to L, problem solving, attention, awareness, activity tolerance, pt/family education   Communication             Safety/Cognition/ Behavioral Observations  Min-Mod A  Supervision  attention, complex problem solving, awareness and safety    Pain   no complaints of pain         Skin   Rash noted to back- kenalog cream as ordered. incision to right scalp and right abodmen staples intact. OTA  remain free of skin breakdown and free from infection min assist  assess skin q shift and apply cream as ordered to back.    Rehab Goals Patient on target to meet rehab goals: Yes *See Care Plan and progress notes for long and short-term goals.  Barriers to Discharge: pt plans to go home with brother    Possible Resolutions to Barriers:  Brother needs training    Discharge Planning/Teaching Needs:  home with brother to provide 24/7 supervision      Team Discussion:  New eval.  Pa does not feel he needs to be here.  No signs of agitation.  Decreased safety awareness and poor problem solving.  Currently light min assist with mobility.  Awaiting for helmut to be adjusted (too big).  Must have 2-3 days with brother for education.  Goals for supervision - MUST have 24/7 supervision at d/c.  Revisions to Treatment Plan:  NA  Continued Need for Acute Rehabilitation Level of Care: The patient requires daily medical management by a physician with specialized training in physical medicine and rehabilitation for the following conditions: Daily direction of a multidisciplinary physical rehabilitation program to ensure safe treatment while eliciting the highest outcome that is of practical value to the patient.: Yes Daily medical management of patient stability for increased activity during participation in an intensive rehabilitation  regime.: Yes Daily analysis of laboratory values and/or radiology reports with any subsequent need for medication adjustment of medical intervention for : Neurological problems;Other;Post surgical problems  Kamareon Sciandra 11/21/2014, 10:16 AM

## 2014-11-20 NOTE — Progress Notes (Signed)
Speech Language Pathology Daily Session Note  Patient Details  Name: Martin Edwards MRN: [id number] Date of Birth: 23-Dec-1945  Today's Date: 11/20/2014 SLP Individual Time: 0800-0900 SLP Individual Time Calculation (min): 60 min  Short Term Goals: Week 1: SLP Short Term Goal 1 (Week 1): Patient will demonstrate functional problem solving for mildly complex tasks with Min A verbal cues.  SLP Short Term Goal 2 (Week 1): Patient will self-monitor and correct errors during functional tasks with Min A question and verbal cues.  SLP Short Term Goal 3 (Week 1): Patient will demonstrate selective attention in a moderately distracting enviornment for 60 minutes with Min A verbal cues.  SLP Short Term Goal 4 (Week 1): Patient will attend to left field of envoirnment during functional tasks with supervision verbal cues.  SLP Short Term Goal 5 (Week 1): Patient will consume current diet without overt s/s of aspiration with Mod I for use of strategies.  SLP Short Term Goal 5 - Progress (Week 1): Met  Skilled Therapeutic Interventions: Skilled treatment session focused on dysphagia and cognitive goals. SLP facilitated session by providing skilled observation with breakfast meal of regular textures with thin liquids. Patient consumed meal without overt s/s of aspiration or oral residue, therefore, recommend patient continue current diet and SLP f/u is no longer needed for dysphagia at this time. SLP also facilitated session by providing extra time and supervision verbal cues for problem solving with tray set-up and use of his LUE throughout self-feeding. Patient also required Mod A verbal and visual cues for functional problem solving with a basic money management task.  Patient left in recliner with all needs within reach. Continue with current plan of care.    FIM:  Comprehension Comprehension Mode: Auditory Expression Expression Mode: Verbal Expression: 5-Expresses basic needs/ideas: With extra  time/assistive device Social Interaction Social Interaction: 6-Interacts appropriately with others with medication or extra time (anti-anxiety, antidepressant). Problem Solving Problem Solving: 3-Solves basic 50 - 74% of the time/requires cueing 25 - 49% of the time Memory Memory: 4-Recognizes or recalls 75 - 89% of the time/requires cueing 10 - 24% of the time FIM - Eating Eating Activity: 6: More than reasonable amount of time  Pain Pain Assessment Pain Assessment: No/denies pain  Therapy/Group: Individual Therapy  Wilberta Dorvil 11/20/2014, 12:18 PM

## 2014-11-20 NOTE — Progress Notes (Signed)
Occupational Therapy Session Note  Patient Details  Name: Martin Edwards MRN: [id number] Date of Birth: [birth year]  Today's Date: 11/20/2014 OT Individual Time: 0700-0800 OT Individual Time Calculation (min): 60 min    Short Term Goals: Week 1:  OT Short Term Goal 1 (Week 1): Pt will demonstrate day to day recall with min A  OT Short Term Goal 2 (Week 1): Pt will obtain own clothing with steady A and min questioning cues OT Short Term Goal 3 (Week 1): Pt will perform toileting with supervision  OT Short Term Goal 4 (Week 1): Pt will enter into tub shower with LRAD wtih supervision   Skilled Therapeutic Interventions/Progress Updates:    Pt engaged in BADL retraining including bating at shower level and dressing with sit<>stand from EOB.  Pt amb with steady A (no AD) to bathroom and voided while standing at toilet.  Pt completed bathing tasks with sit<>stand from TTB.  AFter completing dressing tasks pt amb with close supervision to day room.  Pt requested to locate room.  Although patient remembered room number with no verbal cues, pt required max verbal cues to locate room directional signs to assistance with locating room.  Pt did look on both left and right sides of hall when locating room.  Focus on activity tolerance, safety awareness, problem solving, path finding, standing balance, functional amb without AD, and cognitive remediation.  Therapy Documentation Precautions:  Precautions Precautions: Fall Precaution Comments: L inattention improving; helmet when OOB Restrictions Weight Bearing Restrictions: No General:   Pain: Pain Assessment Pain Assessment: No/denies pain ADL: ADL ADL Comments: see FIM  See FIM for current functional status  Therapy/Group: Individual Therapy  Leroy Libman 11/20/2014, 8:04 AM

## 2014-11-20 NOTE — Progress Notes (Signed)
Physical Therapy Session Note  Patient Details  Name: Martin Edwards MRN: [id number] Date of Birth: [birth year]  Today's Date: 11/20/2014 PT Individual Time: 7414-2395 PT Individual Time Calculation (min): 55 min   Short Term Goals: Week 1:  PT Short Term Goal 1 (Week 1): STG=LTG secondary to LOS  Skilled Therapeutic Interventions/Progress Updates:   Session focused on standing balance, path finding, problem solving, awareness, safety, and attention. Engaged in therapeutic conversation with patient requiring max cues for intellectual awareness of deficits. Patient ambulated to and from bathroom and performed toileting tasks with distant supervision. Patient instructed in path finding task with rule of asking anyone for help except therapist. Patient required mod multimodal cues for problem solving and max multimodal cues to utilize directions and signs to find kitchen after walking past kitchen. Performed simple kitchen task of preparing jello with mod to max step-by-step questioning cues for sequencing, obtaining items needed, accurate measurements, and following directions as well as cue to turn off burner after use. In stairwell, patient negotiated up/down flight of stairs using R rail with self-elected reciprocal pattern and supervision. Patient able to path find back to room with supervision and performed grooming standing at sink with distant supervision. Patient ambulated throughout rehab unit with supervision and no LOB. Patient left sitting in recliner with all needs within reach.   Therapy Documentation Precautions:  Precautions Precautions: Fall Precaution Comments: L inattention improving; helmet when OOB Restrictions Weight Bearing Restrictions: No Pain: Pain Assessment Pain Assessment: No/denies pain  See FIM for current functional status  Therapy/Group: Individual Therapy  Laretta Alstrom 11/20/2014, 4:21 PM

## 2014-11-21 ENCOUNTER — Inpatient Hospital Stay (HOSPITAL_COMMUNITY): Payer: Medicare Other

## 2014-11-21 ENCOUNTER — Inpatient Hospital Stay (HOSPITAL_COMMUNITY): Payer: Medicare Other | Admitting: Physical Therapy

## 2014-11-21 NOTE — Progress Notes (Signed)
Occupational Therapy Session Note  Patient Details  Name: Martin Edwards MRN: [id number] Date of Birth: 01/25/1946  Today's Date: 11/21/2014 OT Individual Time: 0700-0800 OT Individual Time Calculation (min): 60 min    Short Term Goals: Week 1:  OT Short Term Goal 1 (Week 1): Pt will demonstrate day to day recall with min A  OT Short Term Goal 2 (Week 1): Pt will obtain own clothing with steady A and min questioning cues OT Short Term Goal 3 (Week 1): Pt will perform toileting with supervision  OT Short Term Goal 4 (Week 1): Pt will enter into tub shower with LRAD wtih supervision   Skilled Therapeutic Interventions/Progress Updates:    Pt resting in bed upon arrival and agreeable to therapy.  Pt gathered clothing and supplies before amb without AD to ADL apartment to shower.  Pt able to step over into tub using wall for support and completed bathing with sit<>stand from tub transfer bench.  Pt required min verbal cues for safety.  Pt preferred to stand to bathe but agreeable to sitting except to bathe buttocks.  Pt completed dressing with sit<>stand from seat.  Pt returned to room following signs without verbal cues.  Pt requires more than a reasonable amount of time to complete tasks and continues to require min verbal cues for safety awareness.  Focus on cognitive remediation, safety awareness, functional amb without AD, and standing balance.  Therapy Documentation Precautions:  Precautions Precautions: Fall Precaution Comments: L inattention improving; helmet when OOB Restrictions Weight Bearing Restrictions: No Pain:  Pt denied pain ADL: ADL ADL Comments: see FIM  See FIM for current functional status  Therapy/Group: Individual Therapy  Leroy Libman 11/21/2014, 8:02 AM

## 2014-11-21 NOTE — Progress Notes (Signed)
Physical Therapy Session Note  Patient Details  Name: Martin Edwards MRN: [id number] Date of Birth: June 11, 1946  Today's Date: 11/21/2014 PT Individual Time: 5488-4573 PT Individual Time Calculation (min): 30 min   Short Term Goals: Week 1:  PT Short Term Goal 1 (Week 1): STG=LTG secondary to LOS      Therapy Documentation Precautions:  Precautions Precautions: Fall Precaution Comments: L inattention improving; helmet when OOB Restrictions Weight Bearing Restrictions: No   Pain: Pain Assessment Pain Assessment: No/denies pain     Patient ambulated to and from room and therapy gym 200 feet supervision with verbal cues for environmental awareness and topographical orientation. No overt loss of balance noted. Patient up and down 13 steps with right hand rail alternating supervision.  Standing ther ex: B heel raises 30x Mini squats 30x Patient performed four square step test for three trials with a score of 21.17 on one trial and two failed trials secondary to inability to clear canes on the ground.  This indicates patient is a risk for multiple falls.   Dynamic Gait Index 1. Gait Level Surface  3 out of 3 2. Change in gait speed 3 out of 3 3. Gait with Horizontal head turns 2out of 3 4. Gait with vertical head turns 3 out of 3 5. Gait and pivot turn 3out of 3 6. Step over obstacle 2 out of 3 7. Step around obstacles 2 out of 3  8. Steps  2 out of 3 Total score 20/24 indicating decreased risk for falls   Patient left in recliner chair at end of session with all needs met and call bell and phone within reach.   See FIM for current functional status  Therapy/Group: Individual Therapy  Retta Diones 11/21/2014, 10:12 AM

## 2014-11-21 NOTE — Progress Notes (Signed)
Subjective/Complaints: Patient is more accepting of rehabilitation and more cognizant of some of his deficits Daughter is visiting at bedside  No bowel incontinence, no bladder incontinence, regular bowel movements Appetite is good, fluid intake is good  Review systems positive for constipation negative for bladder problems except has occasional leakage and urgency which he states he has had at home following his prostate cancer  Objective: Vital Signs: Blood pressure 120/54, pulse 83, temperature 99.1 F (37.3 C), temperature source Oral, resp. rate 16, SpO2 100 %. No results found. Results for orders placed or performed during the hospital encounter of 11/18/14 (from the past 72 hour(s))  Culture, Urine     Status: None   Collection Time: 11/18/14  5:12 PM  Result Value Ref Range   Specimen Description URINE, CLEAN CATCH    Special Requests rocephin    Colony Count NO GROWTH Performed at [facility]     Culture NO GROWTH Performed at [facility]     Report Status 11/20/2014 FINAL   Urinalysis, Routine w reflex microscopic (not at [facility])     Status: Abnormal   Collection Time: 11/18/14  5:12 PM  Result Value Ref Range   Color, Urine YELLOW YELLOW   APPearance CLEAR CLEAR   Specific Gravity, Urine 1.009 1.005 - 1.030   pH 7.0 5.0 - 8.0   Glucose, UA NEGATIVE NEGATIVE mg/dL   Hgb urine dipstick TRACE (A) NEGATIVE   Bilirubin Urine NEGATIVE NEGATIVE   Ketones, ur NEGATIVE NEGATIVE mg/dL   Protein, ur 30 (A) NEGATIVE mg/dL   Urobilinogen, UA 0.2 0.0 - 1.0 mg/dL   Nitrite NEGATIVE NEGATIVE   Leukocytes, UA NEGATIVE NEGATIVE  Urine microscopic-add on     Status: None   Collection Time: 11/18/14  5:12 PM  Result Value Ref Range   Squamous Epithelial / LPF RARE RARE   RBC / HPF 0-2 <3 RBC/hpf   Bacteria, UA RARE RARE  Phenytoin level, total     Status: Abnormal   Collection Time: 11/19/14  6:20 AM  Result Value Ref Range   Phenytoin Lvl 6.7 (L) 10.0 -  20.0 ug/mL  Comprehensive metabolic panel     Status: Abnormal   Collection Time: 11/19/14 11:15 AM  Result Value Ref Range   Sodium 130 (L) 135 - 145 mmol/L   Potassium 3.2 (L) 3.5 - 5.1 mmol/L   Chloride 90 (L) 101 - 111 mmol/L   CO2 28 22 - 32 mmol/L   Glucose, Bld 139 (H) 65 - 99 mg/dL   BUN 8 6 - 20 mg/dL   Creatinine, Ser 0.90 0.61 - 1.24 mg/dL   Calcium 9.0 8.9 - 10.3 mg/dL   Total Protein 7.3 6.5 - 8.1 g/dL   Albumin 2.9 (L) 3.5 - 5.0 g/dL   AST 184 (H) 15 - 41 U/L   ALT 303 (H) 17 - 63 U/L   Alkaline Phosphatase 288 (H) 38 - 126 U/L   Total Bilirubin 0.4 0.3 - 1.2 mg/dL   GFR calc non Af Amer >60 >60 mL/min   GFR calc Af Amer >60 >60 mL/min    Comment: (NOTE) The eGFR has been calculated using the CKD EPI equation. This calculation has not been validated in all clinical situations. eGFR's persistently <60 mL/min signify possible Chronic Kidney Disease.    Anion gap 12 5 - 15  CBC with Differential/Platelet     Status: Abnormal   Collection Time: 11/19/14 11:15 AM  Result Value Ref Range  WBC 11.6 (H) 4.0 - 10.5 K/uL   RBC 4.11 (L) 4.22 - 5.81 MIL/uL   Hemoglobin 12.7 (L) 13.0 - 17.0 g/dL   HCT 36.6 (L) 39.0 - 52.0 %   MCV 89.1 78.0 - 100.0 fL   MCH 30.9 26.0 - 34.0 pg   MCHC 34.7 30.0 - 36.0 g/dL   RDW 13.3 11.5 - 15.5 %   Platelets 375 150 - 400 K/uL   Neutrophils Relative % 78 (H) 43 - 77 %   Neutro Abs 8.9 (H) 1.7 - 7.7 K/uL   Lymphocytes Relative 11 (L) 12 - 46 %   Lymphs Abs 1.2 0.7 - 4.0 K/uL   Monocytes Relative 11 3 - 12 %   Monocytes Absolute 1.3 (H) 0.1 - 1.0 K/uL   Eosinophils Relative 0 0 - 5 %   Eosinophils Absolute 0.0 0.0 - 0.7 K/uL   Basophils Relative 0 0 - 1 %   Basophils Absolute 0.0 0.0 - 0.1 K/uL  Basic metabolic panel     Status: Abnormal   Collection Time: 11/20/14  7:44 AM  Result Value Ref Range   Sodium 131 (L) 135 - 145 mmol/L   Potassium 3.7 3.5 - 5.1 mmol/L   Chloride 89 (L) 101 - 111 mmol/L   CO2 29 22 - 32 mmol/L    Glucose, Bld 126 (H) 65 - 99 mg/dL   BUN 9 6 - 20 mg/dL   Creatinine, Ser 0.88 0.61 - 1.24 mg/dL   Calcium 9.2 8.9 - 10.3 mg/dL   GFR calc non Af Amer >60 >60 mL/min   GFR calc Af Amer >60 >60 mL/min    Comment: (NOTE) The eGFR has been calculated using the CKD EPI equation. This calculation has not been validated in all clinical situations. eGFR's persistently <60 mL/min signify possible Chronic Kidney Disease.    Anion gap 13 5 - 15     HEENT: normal Cardio: RRR and no murmurs Resp: CTA B/L and unlabored GI: BS positive and nnontender nondistended Extremity:  Pulses positive and No Edema Skin:   Wound C/D/I and no drainage or erythemaright scalp as well as right lower abdominal quadrant Neuro: Alert/Oriented and Normal Motor, decreased sensation on the left side as well as decreased left hand fine motor Musc/Skel:  Other no pain with upper extremity or lower extremity range of motion Gen. no acute distress   Assessment/Plan: 1. 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 Physiatrist is providing close team supervision and 24 hour management of active medical problems listed below. Physiatrist and rehab team continue to assess barriers to discharge/monitor patient progress toward functional and medical goals.  FIM: FIM - Bathing Bathing Steps Patient Completed: Chest, Right Arm, Right lower leg (including foot), Left lower leg (including foot), Left Arm, Abdomen, Front perineal area, Buttocks, Right upper leg, Left upper leg Bathing: 5: Supervision: Safety issues/verbal cues  FIM - Upper Body Dressing/Undressing Upper body dressing/undressing steps patient completed: Thread/unthread left sleeve of pullover shirt/dress, Thread/unthread right sleeve of pullover shirt/dresss,  Pull shirt over trunk, Put head through opening of pull over shirt/dress Upper body dressing/undressing: 5: Set-up assist to: Obtain clothing/put away FIM - Lower Body Dressing/Undressing Lower body dressing/undressing steps patient completed: Thread/unthread right pants leg, Thread/unthread left pants leg, Pull pants up/down, Don/Doff right sock, Don/Doff left sock, Thread/unthread right underwear leg,  Thread/unthread left underwear leg, Pull underwear up/down, Don/Doff right shoe, Don/Doff left shoe Lower body dressing/undressing: 5: Supervision: Safety issues/verbal cues  FIM - Toileting Toileting steps completed by patient: Adjust clothing prior to toileting, Performs perineal hygiene, Adjust clothing after toileting Toileting Assistive Devices: Grab bar or rail for support Toileting: 5: Supervision: Safety issues/verbal cues  FIM - Radio producer Devices: Grab bars Toilet Transfers: 5-To toilet/BSC: Supervision (verbal cues/safety issues), 5-From toilet/BSC: Supervision (verbal cues/safety issues)  FIM - IT sales professional Transfer: 5: Bed > Chair or W/C: Supervision (verbal cues/safety issues), 5: Chair or W/C > Bed: Supervision (verbal cues/safety issues)  FIM - Locomotion: Wheelchair Locomotion: Wheelchair: 0: Activity did not occur FIM - Locomotion: Ambulation Locomotion: Ambulation Assistive Devices: Other (comment) (none) Ambulation/Gait Assistance: 5: Supervision Locomotion: Ambulation: 5: Travels 150 ft or more with supervision/safety issues  Comprehension Comprehension Mode: Auditory Comprehension: 5-Follows basic conversation/direction: With no assist  Expression Expression Mode: Verbal Expression: 5-Expresses basic needs/ideas: With no assist  Social Interaction Social Interaction: 6-Interacts appropriately with others with medication or extra time (anti-anxiety, antidepressant).  Problem Solving Problem Solving: 3-Solves  basic 50 - 74% of the time/requires cueing 25 - 49% of the time  Memory Memory: 2-Recognizes or recalls 25 - 49% of the time/requires cueing 51 - 75% of the time  Medical Problem List and Plan: 1. 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 DVT Prophylaxis/Anticoagulation: Mechanical: Sequential compression devices, below knee Bilateral lower extremities 3. Pain Management: Continue Vicodin prn as effective.  4. Mood: LCSW to follow for evaluation and support.  5. Neuropsych: This patient is capable of making decisions on his own behalf. 6. Skin/Wound Care: Routine pressure relief measure in am.  7. Fluids/Electrolytes/Nutrition: Monitor I/O. Check lytes in am.  8. New onset focal motor seizures: Continue dilantin and Keppra 1000 mg bid. Slightly low  dilantin level but serum alb low at 2.9, no seizures since rehabilitation admission 9. HTN: Will monitor BP every 8 hours. Continue Norvasc, HCTZ and lisinopril. Good control at present 10. Hyponatremia: Likely dilutional due to IVF--is improving. Will recheck labs in am.  11. Hypokalemia: On HCTZ. Supplement for now.  12. Staph UTI: On Rocephin--PCN resistant change to Levaquin 5d course      LOS (Days) 3 A FACE TO FACE EVALUATION WAS PERFORMED  Ralph Brouwer E 11/21/2014, 2:42 PM

## 2014-11-21 NOTE — Progress Notes (Signed)
Physical Therapy Session Note  Patient Details  Name: Martin Edwards MRN: [id number] Date of Birth: 1945/07/30  Today's Date: 11/21/2014 PT Individual Time: 1005-1100 PT Individual Time Calculation (min): 55 min   Short Term Goals: Week 1:  PT Short Term Goal 1 (Week 1): STG=LTG secondary to LOS  Skilled Therapeutic Interventions/Progress Updates:   Session focused on community/outdoor ambulation, path finding, problem solving, awareness, attention, and recall. Patient sitting in recliner, performed toileting with distant supervision before leaving room. Patient required max A for intellectual awareness of deficits and cues for recall of functional task after approx 1 min. Patient required mod-max multimodal A cues with increased time for utilization and interpretation of external aides (hospital signs) to locate gift shop including questioning cues to initiate pressing buttons for elevator. Patient ambulated throughout hospital, on/off elevators, in mildly crowded hospital lobby, over thresholds, outdoors on uneven surfaces including concrete, brick, asphalt, and grass, negotiated up/down curb x 2, up/down flight of brick stairs using 1 rail, inclines/declines, and negotiated small spaces in carpeted gift shop with supervision and no LOB. Patient located 2/2 items in gift shop with increased time and supervision cues. Patient demonstrated slow gait speed without AD and downward gaze with minimal change with cues for increased pace and forward gaze. Upon returning to rehab unit, patient performed simulated car transfers to sedan and SUV height with supervision. Patient performed floor transfer using couch for UE support with supervision. Education provided on falls safety with patient able to state one situation in which he may need to call EMS before attempting to get up. Patient ambulated back to room with S and left sitting in recliner with all needs within reach.   Therapy  Documentation Precautions:  Precautions Precautions: Fall Precaution Comments: L inattention improving; helmet when OOB Restrictions Weight Bearing Restrictions: No Pain: Pain Assessment Pain Assessment: No/denies pain Locomotion : Ambulation Ambulation/Gait Assistance: 5: Supervision   See FIM for current functional status  Therapy/Group: Individual Therapy  Laretta Alstrom 11/21/2014, 10:48 AM

## 2014-11-21 NOTE — Progress Notes (Signed)
Occupational Therapy Session Note  Patient Details  Name: Martin Edwards MRN: [id number] Date of Birth: 03-17-1946  Today's Date: 11/21/2014 OT Individual Time: 1500-1600 OT Individual Time Calculation (min): 60 min    Short Term Goals: Week 1:  OT Short Term Goal 1 (Week 1): Pt will demonstrate day to day recall with min A  OT Short Term Goal 2 (Week 1): Pt will obtain own clothing with steady A and min questioning cues OT Short Term Goal 3 (Week 1): Pt will perform toileting with supervision  OT Short Term Goal 4 (Week 1): Pt will enter into tub shower with LRAD wtih supervision   Skilled Therapeutic Interventions/Progress Updates:    Pt seen for 1:1 OT session with focus on dynamic standing balance, cognitive remediation, functional mobility, attention, and activity tolerance. Pt received supine in bed. Ambulated to bathroom and completed toileting at supervision level. Pt ambulated to [facility] gym and completed complex pipe tree design with min cues for problem solving and more than reasonable amount of time. Pt ambulated to gym with no cues for path finding. Engaged in [place] task activity of hitting numbers on punching bag in standing with supervision for balance. Pt able to follow 4 sequenced number with slight increased time and 100% accuracy. Increased cognitive demand by transitioning to punching letters to spell out words with 100% accuracy. Pt stood and tossed 3# weighted ball while identifying foods with increased time and mod cues. Engaged in [place] task activity of tossing 3# ball at rebounder with LUE while counting backwards by 3 from 50 and 30 with more than reasonable amount of time. Engaged in high level balance activity of ambulating while balance ball on tennis racket at supervision level for approx 200' without dropping ball. Increased physical demand and pt hit ball against ground while ambulating apprx 150' at supervision level for balance. Pt returned to room with no cues for path  finding and left supine in bed with all needs in reach.   Therapy Documentation Precautions:  Precautions Precautions: Fall Precaution Comments: L inattention improving; helmet when OOB Restrictions Weight Bearing Restrictions: No General:   Vital Signs: Therapy Vitals Temp: 99.1 F (37.3 C) Temp Source: Oral Pulse Rate: 83 Resp: 16 BP: (!) 120/54 mmHg Patient Position (if appropriate): Sitting Oxygen Therapy SpO2: 100 % O2 Device: Not Delivered Pain:   ADL: ADL ADL Comments: see FIM Exercises:   Other Treatments:    See FIM for current functional status  Therapy/Group: Individual Therapy  Duayne Cal 11/21/2014, 4:06 PM

## 2014-11-21 NOTE — Progress Notes (Signed)
Occupational Therapy Note  Patient Details  Name: Martin Edwards MRN: [id number] Date of Birth: [birth year]  Today's Date: 11/21/2014 OT Individual Time: 1100-1200 OT Individual Time Calculation (min): 60 min   Pt denied pain Individual Therapy  Pt engaged in dynamic standing activities including standing on compliant surface while tossing weighted ball against mini-trampoline and Biodex activities.  Pt exhibited increased difficulty with Biodex activities and required min A to initiate weight shifts and maintain balance.  Pt exhibits delayed righting reactions. Pt amb without AD to room and remained in recliner awaiting lunch with daughter present.   [name] [facility] 11/21/2014, 12:12 PM

## 2014-11-21 NOTE — IPOC Note (Signed)
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  Hospital Problems: Active Problems:   SDH (subdural hematoma)   Fine motor impairment   Left-sided sensory deficit present     Functional Problem List: Nursing Endurance, Medication Management, Perception, Safety, Skin Integrity  PT Balance, Endurance, Safety, Other (comment) (awareness)  OT Balance, Cognition, Edema, Behavior, Endurance, Motor, Safety, Perception, Skin Integrity, Vision  SLP Cognition  TR Edema, Endurance, Motor, Pain, Safety       Basic ADL's: OT Grooming, Bathing, Dressing, Toileting     Advanced  ADL's: OT       Transfers: PT Floor, Furniture, Bed to Chair, Teacher, early years/pre, Metallurgist: PT Ambulation, Stairs     Additional Impairments: OT Fuctional Use of Upper Extremity  SLP Social Cognition   Problem Solving, Attention, Awareness  TR      Anticipated Outcomes Item Anticipated Outcome  Self Feeding no goal  Swallowing  Mod I   Basic self-care  mod I   Toileting  mod I    Bathroom Transfers mod I   Bowel/Bladder  manage bowel and bladder mod I  Transfers  independent  Locomotion  independent (intermittent daily supervision for safety)  Communication     Cognition  Supervision   Pain  3 or less  Safety/Judgment  Mod I   Therapy Plan: PT Intensity: Minimum of 1-2 x/day ,45 to 90 minutes PT Frequency: 5 out of 7 days PT Duration Estimated Length of Stay: 7-10 days OT Intensity: Minimum of 1-2 x/day, 45 to 90 minutes OT Frequency: 5 out of 7 days OT Duration/Estimated Length of Stay: 10-12 days SLP Intensity: Minumum of 1-2 x/day, 30 to 90 minutes SLP Frequency: 3 to 5 out of 7 days SLP Duration/Estimated Length of Stay: 10-12 days       Team Interventions: Nursing Interventions Patient/Family Education, Disease Management/Prevention,  Medication Management, Skin Care/Wound Management, Cognitive Remediation/Compensation  PT interventions Ambulation/gait training, Cognitive remediation/compensation, Training and development officer, Community reintegration, Discharge planning, DME/adaptive equipment instruction, Functional mobility training, Therapeutic Activities, UE/LE Strength taining/ROM, Visual/perceptual remediation/compensation, UE/LE Coordination activities, Therapeutic Exercise, Stair training, Patient/family education, Neuromuscular re-education  OT Interventions Training and development officer, Cognitive remediation/compensation, Community reintegration, Discharge planning, Functional mobility training, Psychosocial support, Therapeutic Activities, Visual/perceptual remediation/compensation, UE/LE Coordination activities, DME/adaptive equipment instruction, Pain management, Skin care/wound managment, UE/LE Strength taining/ROM, Patient/family education, Self Care/advanced ADL retraining, Neuromuscular re-education, Therapeutic Exercise, Wheelchair propulsion/positioning  SLP Interventions Cognitive remediation/compensation, Cueing hierarchy, Functional tasks, Environmental controls, Dysphagia/aspiration precaution training, Patient/family education, Therapeutic Activities, Internal/external aids  TR Interventions Adaptive equipment instruction, 1:1 session, Training and development officer, Functional mobility training, Community reintegration, Cognitive remediation/compensation, Barrister's clerk education, Therapeutic activities, Recreation/leisure participation, Therapeutic exercise, UE/LE Coordination activities, Visual/perceptual remediation/compensation  SW/CM Interventions      Team Discharge Planning: Destination: PT-Home ,OT- Home , SLP-Home Projected Follow-up: PT-Outpatient PT, OT-  Outpatient OT, SLP-Outpatient SLP, 24 hour supervision/assistance Projected Equipment Needs: PT-None recommended by PT, OT- To be determined, SLP-None  recommended by SLP Equipment Details: PT- , OT-  Patient/family involved in discharge planning: PT- Patient,  OT-Patient, SLP-Patient  MD ELOS: 10-14d Medical Rehab Prognosis:  Good Assessment: 69 year old male with h/o HTN, prostate cancer, renal calculi; who slipped and fell while showering on 11/11/14. Patient with complaints of frontal HA with dizziness for a couple of weeks PTA. He reported being off balance with multiple recent falls. 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 He was taken to OR emergently for right frontotemporal parietal crani for evacuation of SDH. He developed lethary on 06/01 and CT head revealed recurrent SDH. He was taken to OR for craniectomy with placement of bone flap in abdomen and for evacuation with recurrent SDH. Post op developed frequent focal seizures consisting of left gaze deviation with facial and occasional arm twitching  Now requiring 24/7 Rehab RN,MD, as well as CIR level PT, OT and SLP.  Treatment team will focus on ADLs and mobility with goals set at Sup/Mod I   See Team Conference Notes for weekly updates to the plan of care

## 2014-11-21 NOTE — Progress Notes (Signed)
Speech Language Pathology Daily Session Note  Patient Details  Name: Martin Edwards MRN: [id number] Date of Birth: 02-Aug-1945  Today's Date: 11/21/2014 SLP Individual Time: 0930-1000 SLP Individual Time Calculation (min): 30 min  Short Term Goals: Week 1: SLP Short Term Goal 1 (Week 1): Patient will demonstrate functional problem solving for mildly complex tasks with Min A verbal cues.  SLP Short Term Goal 2 (Week 1): Patient will self-monitor and correct errors during functional tasks with Min A question and verbal cues.  SLP Short Term Goal 3 (Week 1): Patient will demonstrate selective attention in a moderately distracting enviornment for 60 minutes with Min A verbal cues.  SLP Short Term Goal 4 (Week 1): Patient will attend to left field of envoirnment during functional tasks with supervision verbal cues.  SLP Short Term Goal 5 (Week 1): Patient will consume current diet without overt s/s of aspiration with Mod I for use of strategies.  SLP Short Term Goal 5 - Progress (Week 1): Met  Skilled Therapeutic Interventions: Skilled treatment session focused on cognitive goals. Marland Kitchen SLP facilitated session by providing education to the patient in regards to her current medications and their functions. Patient was able to independently recall 40% of his medications, therefore, a visual aid was created to reinforce information.  Patient also required Total A multimodal cues for complex problem solving and organization while organizing a 4 time per day pill box. Patient left upright in recliner with all needs within reach. Continue with current plan of care.    FIM:  Comprehension Comprehension Mode: Auditory Comprehension: 5-Follows basic conversation/direction: With no assist Expression Expression Mode: Verbal Expression: 5-Expresses basic needs/ideas: With no assist Social Interaction Social Interaction: 6-Interacts appropriately with others with medication or extra time (anti-anxiety,  antidepressant). Problem Solving Problem Solving: 3-Solves basic 50 - 74% of the time/requires cueing 25 - 49% of the time Memory Memory: 2-Recognizes or recalls 25 - 49% of the time/requires cueing 51 - 75% of the time  Pain Pain Assessment Pain Assessment: No/denies pain  Therapy/Group: Individual Therapy  Martin Edwards 11/21/2014, 4:43 PM

## 2014-11-22 ENCOUNTER — Inpatient Hospital Stay (HOSPITAL_COMMUNITY): Payer: Medicare Other | Admitting: Speech Pathology

## 2014-11-22 ENCOUNTER — Inpatient Hospital Stay (HOSPITAL_COMMUNITY): Payer: Medicare Other

## 2014-11-22 ENCOUNTER — Inpatient Hospital Stay (HOSPITAL_COMMUNITY): Payer: Medicare Other | Admitting: Physical Therapy

## 2014-11-22 LAB — BASIC METABOLIC PANEL
Anion gap: 10 (ref 5–15)
BUN: 11 mg/dL (ref 6–20)
CALCIUM: 8.6 mg/dL — AB (ref 8.9–10.3)
CHLORIDE: 101 mmol/L (ref 101–111)
CO2: 24 mmol/L (ref 22–32)
Creatinine, Ser: 0.88 mg/dL (ref 0.61–1.24)
GFR calc Af Amer: >60 mL/min (ref >60)
GFR calc non Af Amer: >60 mL/min (ref >60)
Glucose, Bld: 105 mg/dL — ABNORMAL HIGH (ref 65–99)
POTASSIUM: 4.6 mmol/L (ref 3.5–5.1)
Sodium: 135 mmol/L (ref 135–145)

## 2014-11-22 NOTE — Progress Notes (Signed)
Subjective/Complaints: Denies pain, headaches are minimal. ROS: Pt denies fever, rash/itching, headache, blurred or double vision, nausea, vomiting, abdominal pain, diarrhea, chest pain, shortness of breath, palpitations, dysuria, dizziness, neck or back pain, bleeding, anxiety, or depression   Objective: Vital Signs: Blood pressure 114/71, pulse 70, temperature 97.9 F (36.6 C), temperature source Oral, resp. rate 16, SpO2 100 %. No results found. Results for orders placed or performed during the hospital encounter of 11/18/14 (from the past 72 hour(s))  Comprehensive metabolic panel     Status: Abnormal   Collection Time: 11/19/14 11:15 AM  Result Value Ref Range   Sodium 130 (L) 135 - 145 mmol/L   Potassium 3.2 (L) 3.5 - 5.1 mmol/L   Chloride 90 (L) 101 - 111 mmol/L   CO2 28 22 - 32 mmol/L   Glucose, Bld 139 (H) 65 - 99 mg/dL   BUN 8 6 - 20 mg/dL   Creatinine, Ser 0.90 0.61 - 1.24 mg/dL   Calcium 9.0 8.9 - 10.3 mg/dL   Total Protein 7.3 6.5 - 8.1 g/dL   Albumin 2.9 (L) 3.5 - 5.0 g/dL   AST 184 (H) 15 - 41 U/L   ALT 303 (H) 17 - 63 U/L   Alkaline Phosphatase 288 (H) 38 - 126 U/L   Total Bilirubin 0.4 0.3 - 1.2 mg/dL   GFR calc non Af Amer >60 >60 mL/min   GFR calc Af Amer >60 >60 mL/min    Comment: (NOTE) The eGFR has been calculated using the CKD EPI equation. This calculation has not been validated in all clinical situations. eGFR's persistently <60 mL/min signify possible Chronic Kidney Disease.    Anion gap 12 5 - 15  CBC with Differential/Platelet     Status: Abnormal   Collection Time: 11/19/14 11:15 AM  Result Value Ref Range   WBC 11.6 (H) 4.0 - 10.5 K/uL   RBC 4.11 (L) 4.22 - 5.81 MIL/uL   Hemoglobin 12.7 (L) 13.0 - 17.0 g/dL   HCT 36.6 (L) 39.0 - 52.0 %   MCV 89.1 78.0 - 100.0 fL   MCH 30.9 26.0 - 34.0 pg   MCHC 34.7 30.0 - 36.0 g/dL   RDW 13.3 11.5 - 15.5 %   Platelets 375 150 - 400 K/uL   Neutrophils Relative % 78 (H) 43 - 77 %   Neutro Abs 8.9 (H)  1.7 - 7.7 K/uL   Lymphocytes Relative 11 (L) 12 - 46 %   Lymphs Abs 1.2 0.7 - 4.0 K/uL   Monocytes Relative 11 3 - 12 %   Monocytes Absolute 1.3 (H) 0.1 - 1.0 K/uL   Eosinophils Relative 0 0 - 5 %   Eosinophils Absolute 0.0 0.0 - 0.7 K/uL   Basophils Relative 0 0 - 1 %   Basophils Absolute 0.0 0.0 - 0.1 K/uL  Basic metabolic panel     Status: Abnormal   Collection Time: 11/20/14  7:44 AM  Result Value Ref Range   Sodium 131 (L) 135 - 145 mmol/L   Potassium 3.7 3.5 - 5.1 mmol/L   Chloride 89 (L) 101 - 111 mmol/L   CO2 29 22 - 32 mmol/L   Glucose, Bld 126 (H) 65 - 99 mg/dL   BUN 9 6 - 20 mg/dL   Creatinine, Ser 0.88 0.61 - 1.24 mg/dL   Calcium 9.2 8.9 - 10.3 mg/dL   GFR calc non Af Amer >60 >60 mL/min   GFR calc Af Amer >60 >60 mL/min    Comment: (  NOTE) The eGFR has been calculated using the CKD EPI equation. This calculation has not been validated in all clinical situations. eGFR's persistently <60 mL/min signify possible Chronic Kidney Disease.    Anion gap 13 5 - 15     HEENT: normal Cardio: RRR and no murmurs Resp: CTA B/L and unlabored GI: BS positive and nnontender nondistended Extremity:  Pulses positive and No Edema Skin:   Wound C/D/I and no drainage or erythemaright scalp as well as right lower abdominal quadrant Neuro: Alert/Oriented. Sensory intact. Motor 4-5/5 in all 4's. Fair coordination Musc/Skel:  Other no pain with upper extremity or lower extremity range of motion Gen. no acute distress   Assessment/Plan: 1. 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 Physiatrist is providing close team supervision and 24 hour management of active medical problems listed below. Physiatrist and rehab team continue to assess barriers to discharge/monitor patient  progress toward functional and medical goals.  FIM: FIM - Bathing Bathing Steps Patient Completed: Chest, Right Arm, Right lower leg (including foot), Left lower leg (including foot), Left Arm, Abdomen, Front perineal area, Buttocks, Right upper leg, Left upper leg Bathing: 5: Supervision: Safety issues/verbal cues  FIM - Upper Body Dressing/Undressing Upper body dressing/undressing steps patient completed: Thread/unthread left sleeve of pullover shirt/dress, Thread/unthread right sleeve of pullover shirt/dresss, Pull shirt over trunk, Put head through opening of pull over shirt/dress Upper body dressing/undressing: 6: More than reasonable amount of time FIM - Lower Body Dressing/Undressing Lower body dressing/undressing steps patient completed: Thread/unthread right pants leg, Thread/unthread left pants leg, Pull pants up/down, Don/Doff right sock, Don/Doff left sock, Thread/unthread right underwear leg, Thread/unthread left underwear leg, Pull underwear up/down, Don/Doff right shoe, Don/Doff left shoe Lower body dressing/undressing: 5: Supervision: Safety issues/verbal cues  FIM - Toileting Toileting steps completed by patient: Adjust clothing prior to toileting, Performs perineal hygiene, Adjust clothing after toileting Toileting Assistive Devices: Grab bar or rail for support Toileting: 5: Supervision: Safety issues/verbal cues  FIM - Radio producer Devices: Grab bars Toilet Transfers: 5-To toilet/BSC: Supervision (verbal cues/safety issues), 5-From toilet/BSC: Supervision (verbal cues/safety issues)  FIM - IT sales professional Transfer: 5: Bed > Chair or W/C: Supervision (verbal cues/safety issues), 5: Chair or W/C > Bed: Supervision (verbal cues/safety issues)  FIM - Locomotion: Wheelchair Locomotion: Wheelchair: 0: Activity did not occur FIM - Locomotion: Ambulation Locomotion: Ambulation Assistive Devices: Other (comment)  (none) Ambulation/Gait Assistance: 5: Supervision Locomotion: Ambulation: 5: Travels 150 ft or more with supervision/safety issues  Comprehension Comprehension Mode: Auditory Comprehension: 5-Follows basic conversation/direction: With no assist  Expression Expression Mode: Verbal Expression: 5-Expresses basic needs/ideas: With no assist  Social Interaction Social Interaction: 6-Interacts appropriately with others with medication or extra time (anti-anxiety, antidepressant).  Problem Solving Problem Solving: 3-Solves basic 50 - 74% of the time/requires cueing 25 - 49% of the time  Memory Memory: 3-Recognizes or recalls 50 - 74% of the time/requires cueing 25 - 49% of the time  Medical Problem List and Plan: 1. 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 DVT Prophylaxis/Anticoagulation: Mechanical: Sequential compression devices, below knee Bilateral lower extremities 3. Pain Management: Continue Vicodin prn as effective.  4. Mood: LCSW to follow for evaluation and support.  5. Neuropsych: This patient is capable of making decisions  on his own behalf. 6. Skin/Wound Care: Routine pressure relief.  7. Fluids/Electrolytes/Nutrition: keep an eye on potasssium 8. New onset focal motor seizures: Continue dilantin and Keppra 1000 mg bid.   -dilantin level low, albumin low too---no sx. Continue current rx 9. HTN: Will monitor BP every 8 hours. Continue Norvasc, HCTZ and lisinopril. Good control at present 10. Hyponatremia: Likely dilutional due to IVF--is improving. Will recheck labs in am.  11. Hypokalemia: On HCTZ. Supplement for now.  12. Staph UTI: On Rocephin--PCN resistant change to Levaquin 5d course      LOS (Days) 4 A FACE TO FACE EVALUATION WAS PERFORMED  Sayra Frisby T 11/22/2014, 9:34 AM

## 2014-11-22 NOTE — Progress Notes (Signed)
Occupational Therapy Note  Patient Details  Name: NESTOR WIENEKE MRN: [id number] Date of Birth: 1946-01-23  Today's Date: 11/22/2014 OT Individual Time: 1300-1400 OT Individual Time Calculation (min): 60 min   Pt denied pain Individual Therapy  Pt engaged in path finding activity - exiting unit and hospital via [facility] and reentering hospital via a different entrance.  Pt recognized entrance from previous day but required min verbal cues to locate elevators.  Pt exhibited difficulty problem solving directions on signage to find path to therapy gym/room.  Pt engaged in dynamic standing tasks with Wii Sports.  Pt returned to room and remained in recliner with all needs within reach.   [name] [facility] 11/22/2014, 3:06 PM

## 2014-11-22 NOTE — Progress Notes (Signed)
Speech Language Pathology Daily Session Note  Patient Details  Name: Martin Edwards MRN: [id number] Date of Birth: February 15, 1946  Today's Date: 11/22/2014 SLP Individual Time: 1000-1100 SLP Individual Time Calculation (min): 60 min  Short Term Goals: Week 1: SLP Short Term Goal 1 (Week 1): Patient will demonstrate functional problem solving for mildly complex tasks with Min A verbal cues.  SLP Short Term Goal 2 (Week 1): Patient will self-monitor and correct errors during functional tasks with Min A question and verbal cues.  SLP Short Term Goal 3 (Week 1): Patient will demonstrate selective attention in a moderately distracting enviornment for 60 minutes with Min A verbal cues.  SLP Short Term Goal 4 (Week 1): Patient will attend to left field of envoirnment during functional tasks with supervision verbal cues.  SLP Short Term Goal 5 (Week 1): Patient will consume current diet without overt s/s of aspiration with Mod I for use of strategies.  SLP Short Term Goal 5 - Progress (Week 1): Met  Skilled Therapeutic Interventions: Skilled treatment session focused on cognitive goals. Upon arrival, patient was sitting upright in recliner and requested to use the bathroom. Patient completed all self-care tasks with extra time and supervision. Patient ambulated to and from the SLP office with supervision verbal cues and extra time.  SLP also facilitated session by providing Mod A verbal and visual cues for functional problem solving during a mildly complex task of organizing a 4 time per day pill box. Patient 's performance with task was much improved compared to yesterday's session.  Patient left upright in recliner with all needs within reach. Continue with current plan of care.    FIM:  Comprehension Comprehension Mode: Auditory Comprehension: 5-Follows basic conversation/direction: With no assist Expression Expression Mode: Verbal Expression: 5-Expresses basic needs/ideas: With no assist Social  Interaction Social Interaction: 6-Interacts appropriately with others with medication or extra time (anti-anxiety, antidepressant). Problem Solving Problem Solving: 4-Solves basic 75 - 89% of the time/requires cueing 10 - 24% of the time Memory Memory: 4-Recognizes or recalls 75 - 89% of the time/requires cueing 10 - 24% of the time  Pain Pain Assessment Pain Assessment: No/denies pain  Therapy/Group: Individual Therapy  Martin Edwards 11/22/2014, 4:21 PM

## 2014-11-22 NOTE — Progress Notes (Addendum)
Physical Therapy Session Note  Patient Details  Name: Martin Edwards MRN: [id number] Date of Birth: Nov 08, 1945  Today's Date: 11/22/2014 PT Individual Time: 1100-1200 and 1157-2620 PT Individual Time Calculation (min): 60 min and 30 min    Short Term Goals: Week 1:  PT Short Term Goal 1 (Week 1): STG=LTG secondary to LOS  Skilled Therapeutic Interventions/Progress Updates:   1: Session focused on dynamic standing balance, balance strategies, and cognitive remediation. Patient sitting in recliner, ambulated in room and performed toileting with distant supervision. Patient ambulated throughout rehab unit without device with supervision with slow gait speed and downward gaze. Standing dynamic balance with supervision - tandem gait, side stepping pushing BOSU ball to R and L x 30 ft each direction for hip abductor strengthening. Patient retrieved BOSU ball from floor with good technique for squatting and carried into gym. In parallel bars with BUE support to step on/off flat side of BOSU, patient performed static standing without UE support on BOSU ball flat side up with min guard > supervision x 3-4 min progessed to mini squats x 10 on BOSU ball with min guard for balance. In standing, patient performed sorting task with more than reasonable amount of time and mod multimodal cues with 80% accuracy. After completing 3/4 of activity, patient required min questioning cues for higher level organizational task. Patient left sitting in recliner with ex wife present, needs within reach.   2: Patient received sitting in recliner visiting with friend. Patient at supervision level without device for all mobility, 2 x 250 ft. Patient ambulated to bathroom to urinate before leaving room and performed path finding to/from rehab gym. NuStep using BUE/BLE at level 7 x 10 min with cues for increased pace, able to keep steps per minute > 55. Patient negotiated up/down flight of stairs in stairwell using 1 rail with  supervision. Performed dynamic standing balance with dual tasking, single leg stance while throwing small weighted ball at rebounder and naming items in category. Patient required cues to initiate naming task while throwing ball, S-min A for balance. Patient returned to room and left sitting in recliner with all needs within reach.   Therapy Documentation Precautions:  Precautions Precautions: Fall Precaution Comments: L inattention improving; helmet when OOB Restrictions Weight Bearing Restrictions: No Pain: Pain Assessment Pain Assessment: No/denies pain Locomotion : Ambulation Ambulation/Gait Assistance: 5: Supervision   See FIM for current functional status  Therapy/Group: Individual Therapy  Laretta Alstrom 11/22/2014, 12:25 PM

## 2014-11-22 NOTE — Progress Notes (Signed)
Occupational Therapy Session Note  Patient Details  Name: Martin Edwards MRN: [id number] Date of Birth: 05-Dec-1945  Today's Date: 11/22/2014 OT Individual Time: 0700-0800 OT Individual Time Calculation (min): 60 min    Short Term Goals: Week 1:  OT Short Term Goal 1 (Week 1): Pt will demonstrate day to day recall with min A  OT Short Term Goal 2 (Week 1): Pt will obtain own clothing with steady A and min questioning cues OT Short Term Goal 3 (Week 1): Pt will perform toileting with supervision  OT Short Term Goal 4 (Week 1): Pt will enter into tub shower with LRAD wtih supervision   Skilled Therapeutic Interventions/Progress Updates:    Pt engaged in BADL retraining including bathing at shower level and dressing with sit<>stand from EOB. Pt amb in room without AD to gather clothing and supplies before entering bathroom for shower.  Pt used grab bars when standing in shower.  Pt required min verbal cues to don helmet when ambulating.  Pt is not currently exhibiting any left inattention.  Pt performed home mgmt tasks in room including carrying tray into room and setting on bedside table.  Pt requires more than reasonable amount of time to complete tasks.    Therapy Documentation Precautions:  Precautions Precautions: Fall Precaution Comments: L inattention improving; helmet when OOB Restrictions Weight Bearing Restrictions: No   ADL: ADL ADL Comments: see FIM  See FIM for current functional status  Therapy/Group: Individual Therapy  Leroy Libman 11/22/2014, 8:01 AM

## 2014-11-23 ENCOUNTER — Inpatient Hospital Stay (HOSPITAL_COMMUNITY): Payer: Medicare Other | Admitting: Speech Pathology

## 2014-11-23 ENCOUNTER — Inpatient Hospital Stay (HOSPITAL_COMMUNITY): Payer: Medicare Other

## 2014-11-23 ENCOUNTER — Inpatient Hospital Stay (HOSPITAL_COMMUNITY): Payer: Medicare Other | Admitting: Occupational Therapy

## 2014-11-23 MED ORDER — TRIAMCINOLONE ACETONIDE 0.5 % EX OINT
TOPICAL_OINTMENT | Freq: Three times a day (TID) | CUTANEOUS | Status: DC
  Administered 2014-11-23: 1 via TOPICAL
  Administered 2014-11-24: 14:00:00 via TOPICAL
  Administered 2014-11-24: 1 via TOPICAL
  Administered 2014-11-24 – 2014-11-28 (×11): via TOPICAL
  Filled 2014-11-23 (×3): qty 15

## 2014-11-23 NOTE — Care Management Note (Signed)
[facility] Individual Statement of Services  Patient Name:  Martin Edwards  Date:  11/21/2014  Welcome to the [facility].  Our goal is to provide you with an individualized program based on your diagnosis and situation, designed to meet your specific needs.  With this comprehensive rehabilitation program, you will be expected to participate in at least 3 hours of rehabilitation therapies Monday-Friday, with modified therapy programming on the weekends.  Your rehabilitation program will include the following services:  Physical Therapy (PT), Occupational Therapy (OT), Speech Therapy (ST), 24 hour per day rehabilitation nursing, Therapeutic Recreaction (TR), Neuropsychology, Case Management (Social Worker), Rehabilitation Medicine, Nutrition Services and Pharmacy Services  Weekly team conferences will be held on Tuesdays to discuss your progress.  Your Social Worker will talk with you frequently to get your input and to update you on team discussions.  Team conferences with you and your family in attendance may also be held.  Expected length of stay: 10-14 days  Overall anticipated outcome: supervision  Depending on your progress and recovery, your program may change. Your Social Worker will coordinate services and will keep you informed of any changes. Your Social Worker's name and contact numbers are listed  below.  The following services may also be recommended but are not provided by the [facility] will be made to provide these services after discharge if needed.  Arrangements include referral to agencies that provide these services.  Your insurance has been verified to be:  [facility] Medicare Your primary doctor is:  Arlyss Queen  Pertinent information will be shared with your doctor and your insurance company.  Social  Worker:  [place], [place] or ([phone]   Information discussed with and copy given to patient by: Lennart Pall, 11/21/2014, 2:15 PM

## 2014-11-23 NOTE — Progress Notes (Addendum)
Subjective/Complaints: Feels well. Pain controlled. Therapy  Progressing well ROS: Pt denies fever, rash/itching, headache, blurred or double vision, nausea, vomiting, abdominal pain, diarrhea, chest pain, shortness of breath, palpitations, dysuria, dizziness, neck or back pain, bleeding, anxiety, or depression   Objective: Vital Signs: Blood pressure 133/61, pulse 68, temperature 98.4 F (36.9 C), temperature source Oral, resp. rate 13, SpO2 100 %. No results found. Results for orders placed or performed during the hospital encounter of 11/18/14 (from the past 72 hour(s))  Basic metabolic panel     Status: Abnormal   Collection Time: 11/22/14  4:48 AM  Result Value Ref Range   Sodium 135 135 - 145 mmol/L   Potassium 4.6 3.5 - 5.1 mmol/L    Comment: SPECIMEN HEMOLYZED. HEMOLYSIS MAY AFFECT INTEGRITY OF RESULTS.   Chloride 101 101 - 111 mmol/L   CO2 24 22 - 32 mmol/L   Glucose, Bld 105 (H) 65 - 99 mg/dL   BUN 11 6 - 20 mg/dL   Creatinine, Ser 0.88 0.61 - 1.24 mg/dL   Calcium 8.6 (L) 8.9 - 10.3 mg/dL   GFR calc non Af Amer >60 >60 mL/min   GFR calc Af Amer >60 >60 mL/min    Comment: (NOTE) The eGFR has been calculated using the CKD EPI equation. This calculation has not been validated in all clinical situations. eGFR's persistently <60 mL/min signify possible Chronic Kidney Disease.    Anion gap 10 5 - 15     HEENT: normal Cardio: RRR and no murmurs Resp: CTA B/L and unlabored GI: BS positive and nnontender nondistended Extremity:  Pulses positive and No Edema Skin:   Scalp wound well as right lower abdominal quadrant wounds intact Neuro: Alert/Oriented. Sensory intact. Motor 4-5/5 in all 4's. [place] coordination. Has fair insight and awareness Musc/Skel:  Other no pain with upper extremity or lower extremity range of motion Gen. no acute distress   Assessment/Plan: 1. 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 Physiatrist is providing close team supervision and 24 hour management of active medical problems listed below. Physiatrist and rehab team continue to assess barriers to discharge/monitor patient progress toward functional and medical goals.  FIM: FIM - Bathing Bathing Steps Patient Completed: Chest, Right Arm, Right lower leg (including foot), Left lower leg (including foot), Left Arm, Abdomen, Front perineal area, Buttocks, Right upper leg, Left upper leg Bathing: 5: Supervision: Safety issues/verbal cues  FIM - Upper Body Dressing/Undressing Upper body dressing/undressing steps patient completed: Thread/unthread left sleeve of pullover shirt/dress, Thread/unthread right sleeve of pullover shirt/dresss, Pull shirt over trunk, Put head through opening of pull over shirt/dress Upper body dressing/undressing: 6: More than reasonable amount of time FIM - Lower Body Dressing/Undressing Lower body dressing/undressing steps patient completed: Thread/unthread right pants leg, Thread/unthread left pants leg, Pull pants up/down, Don/Doff right sock, Don/Doff left sock, Thread/unthread right underwear leg, Thread/unthread left underwear leg, Pull underwear up/down, Don/Doff right shoe, Don/Doff left shoe Lower body dressing/undressing: 5: Supervision: Safety issues/verbal cues  FIM - Toileting Toileting steps completed by patient: Adjust clothing prior to toileting, Performs perineal hygiene, Adjust clothing after toileting Toileting Assistive Devices: Grab bar or rail for support Toileting: 5: Supervision: Safety issues/verbal cues  FIM - Radio producer Devices: Grab bars Toilet Transfers: 5-To toilet/BSC: Supervision (verbal cues/safety issues), 5-From toilet/BSC: Supervision (verbal cues/safety issues)  FIM - Bed/Chair  Transfer Bed/Chair Transfer: 5: Bed > Chair or W/C: Supervision (verbal cues/safety issues), 5: Chair or W/C > Bed: Supervision (verbal cues/safety issues)  FIM - Locomotion: Wheelchair Locomotion: Wheelchair: 0: Activity did not occur FIM - Locomotion: Ambulation Locomotion: Ambulation Assistive Devices: Other (comment) (none) Ambulation/Gait Assistance: 5: Supervision Locomotion: Ambulation: 5: Travels 150 ft or more with supervision/safety issues  Comprehension Comprehension Mode: Auditory Comprehension: 5-Follows basic conversation/direction: With no assist  Expression Expression Mode: Verbal Expression: 5-Expresses basic needs/ideas: With no assist  Social Interaction Social Interaction: 6-Interacts appropriately with others with medication or extra time (anti-anxiety, antidepressant).  Problem Solving Problem Solving: 4-Solves basic 75 - 89% of the time/requires cueing 10 - 24% of the time  Memory Memory: 4-Recognizes or recalls 75 - 89% of the time/requires cueing 10 - 24% of the time  Medical Problem List and Plan: 1. 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 DVT Prophylaxis/Anticoagulation: Mechanical: Sequential compression devices, below knee Bilateral lower extremities 3. Pain Management: Continue Vicodin prn as effective.  4. Mood: LCSW to follow for evaluation and support.  5. Neuropsych: This patient is capable of making decisions on his own behalf. 6. Skin/Wound Care: Remove staples on monday 7. Fluids/Electrolytes/Nutrition: keep an eye on potasssium 8. New onset focal motor seizures: Continue dilantin and Keppra 1000 mg bid.   -continue current rx 9. HTN: Will monitor BP every 8 hours. Continue Norvasc, HCTZ and lisinopril. Good control at present 10. Hyponatremia: resolved off IVF.    11. Hypokalemia: On HCTZ. Supplement for now.  12. Staph  UTI: abx completed      LOS (Days) 5 A FACE TO FACE EVALUATION WAS PERFORMED  SWARTZ,ZACHARY T 11/23/2014, 8:39 AM

## 2014-11-23 NOTE — Progress Notes (Signed)
Speech Language Pathology Daily Session Note  Patient Details  Name: Martin Edwards MRN: [id number] Date of Birth: 11-04-1945  Today's Date: 11/23/2014 SLP Individual Time: 0915-1000 SLP Individual Time Calculation (min): 45 min  Short Term Goals: Week 1: SLP Short Term Goal 1 (Week 1): Patient will demonstrate functional problem solving for mildly complex tasks with Min A verbal cues.  SLP Short Term Goal 1 - Progress (Week 1): Progressing toward goal SLP Short Term Goal 2 (Week 1): Patient will self-monitor and correct errors during functional tasks with Min A question and verbal cues.  SLP Short Term Goal 2 - Progress (Week 1): Progressing toward goal SLP Short Term Goal 3 (Week 1): Patient will demonstrate selective attention in a moderately distracting enviornment for 60 minutes with Min A verbal cues.  SLP Short Term Goal 3 - Progress (Week 1): Progressing toward goal SLP Short Term Goal 4 (Week 1): Patient will attend to left field of envoirnment during functional tasks with supervision verbal cues.  SLP Short Term Goal 4 - Progress (Week 1): Progressing toward goal SLP Short Term Goal 5 (Week 1): Patient will consume current diet without overt s/s of aspiration with Mod I for use of strategies.  SLP Short Term Goal 5 - Progress (Week 1): Met  Skilled Therapeutic Interventions: Skilled treatment session focused on cognitive goals. Patient ambulated to and from the SLP office with supervision verbal cues and extra time. Pt participated in problem-solving, planning task involving medication distribution, scheduling using seven-day pill box.  He required min verbal/visual cues to achieve success.  Higher-level attention (divided) during newspaper article reading task required min verbal cues. Continue with current plan of care.      FIM:   not populating   Pain Pain Assessment Pain Assessment: No/denies pain  Therapy/Group: Individual Therapy  Juan Quam Laurice 11/23/2014,  12:34 PM

## 2014-11-23 NOTE — Progress Notes (Addendum)
Social Work  Social Work Assessment and Plan  Patient Details  Name: Martin Edwards MRN: [id number] Date of Birth: [birth year]  Today's Date: 11/23/2014  Problem List:  Patient Active Problem List   Diagnosis Date Noted  . 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 Left-sided sensory deficit present 11/19/2014  . SDH (subdural hematoma) 11/18/2014  . Subdural hematoma 11/11/2014  . Small bowel obstruction due to adhesions 01/30/2014  . Sebaceous cyst x2 01/04/2014  . Prostate cancer 12/12/2012  . Hypertension 10/21/2011  . Hyperlipidemia 10/21/2011  . Adenomatous colon polyp 10/21/2011  . Postop check 12/29/2010   Past Medical History:  Past Medical History  Diagnosis Date  . Hypertension   . Hyperlipidemia   . Kidney stones     "passed them"  . Prostate cancer 1999  . Basal cell carcinoma of face X 2  . Small bowel obstruction due to adhesions 01/30/2014   Past Surgical History:  Past Surgical History  Procedure Laterality Date  . Prostatectomy  05/20/1998  . Colectomy  10/2010    "took out 8 inches of colon; wasn't cancer"  . Basal cell carcinoma excision  X 2    "off my face both times"  . Craniotomy Right 11/11/2014    Procedure: CRANIOTOMY HEMATOMA EVACUATION SUBDURAL;  Surgeon: Leeroy Cha, MD;  Location: [facility];  Service: Neurosurgery;  Laterality: Right;  . Craniotomy Right 11/13/2014    Procedure: Removal bone flap, insertion into the abdominal wall;  Surgeon: Leeroy Cha, MD;  Location: [facility];  Service: Neurosurgery;  Laterality: Right;   Social History:  reports that he has never smoked. He has never used smokeless tobacco. He reports that he drinks alcohol. He reports that he does not use illicit drugs.  Family / Support Systems Marital Status: Divorced How Long?: 1998 Patient Roles: Parent, Other (Comment) (sibling) Children: daughter, Martin Edwards) @ [phone]; son, Martin Edwards (lives 5 mins from pt) @ [phone] Other  Supports: brother, Martin Edwards (also local to pt) @ [phone] Anticipated Caregiver: still being determined - likely either brother or daughter Ability/Limitations of Caregiver: Brother is retired and can assist.  Son works.  Dtr lives in [place]. Caregiver Availability: 24/7 Family Dynamics: Pt and daughter appear very close and daughter very concerned for her father's welfare.  Good support from multiple family members and daughter's boyfriend very supportive of possiblity pt would stay with them at d/c.  Social History Preferred language: English Religion: Baptist Cultural Background: NA Read: Yes Write: Yes Employment Status: Retired Date Retired/Disabled/Unemployed: 57 yrs Freight forwarder Issues: None Guardian/Conservator: None - per MD, pt capable of making decisions on his own behalf   Abuse/Neglect Physical Abuse: Denies Verbal Abuse: Denies Sexual Abuse: Denies Exploitation of patient/patient's resources: Denies Self-Neglect: Denies  Emotional Status Pt's affect, behavior adn adjustment status: pt very pleasant, soft-spoken and answers questions slowly but directly.  He admits feeling frustrated with the circumstances, however, denies any significant emotional distress.  Denies any s/s of depression or anxiety, therefore , will defer any formal depression screen at this time.  Will refer to neuropsychology next week. Recent Psychosocial Issues: None Pyschiatric History: None Substance Abuse History: None  Patient / Family Perceptions, Expectations & Goals Pt/Family understanding of illness & functional limitations: pt able to give basic detail, information as he understands his TBI.  Aware need for further surgery to replace bone flap.  He denies any significant cognitive deficits, however, daughter does describe him as "slower".  Daughter with good understanding  of his injury and likely the extent of care at d/c. Premorbid pt/family roles/activities: pt was  completely independent and very active;  reports playing tennis 2-3 days a week. Anticipated changes in roles/activities/participation: Goals are set for supervision, therefore, either daughter or brother will need to assume this caregiver support role. Pt/family expectations/goals: "I don't know....guess I just want to get better and be able to go home..."  [employer]: None Premorbid Home Care/DME Agencies: None Transportation available at discharge: yes Resource referrals recommended: Neuropsychology  Discharge Planning Living Arrangements: Alone Support Systems: Children, Other relatives Type of Residence: Private residence Insurance Resources: [employer] (([place] Medicare)) Financial Resources: Radio broadcast assistant Screen Referred: No Living Expenses: Own Money Management: Patient Does the patient have any problems obtaining your medications?: No Home Management: pt was independent PTA Patient/Family Preliminary Plans: Pt reports that he is still in discussion with brother and daughter about which home he will d/c to Social Work Anticipated Follow Up Needs: HH/OP Expected length of stay: 10-14 days  Clinical Impression Unfortunate gentleman here following a recent fall with resulting SDH and s/p craniectomy.  Was living alone and independent but does have supportive family and a couple of d/c options where he can receive 24/7 supervision.  No noted emotional distress.  Will monitor and plan to refer to neuropsychology next week.  Will follow for support and d/c planning needs.  Martin Edwards 11/21/2014, 2:00 PM

## 2014-11-23 NOTE — Progress Notes (Signed)
Occupational Therapy Session Note  Patient Details  Name: Martin Edwards MRN: [id number] Date of Birth: Jan 20, 1946  Today's Date: 11/23/2014 OT Individual Time:  - 1300-1330  (30 min)      Short Term Goals: Week 1:  OT Short Term Goal 1 (Week 1): Pt will demonstrate day to day recall with min A  OT Short Term Goal 2 (Week 1): Pt will obtain own clothing with steady A and min questioning cues OT Short Term Goal 3 (Week 1): Pt will perform toileting with supervision  OT Short Term Goal 4 (Week 1): Pt will enter into tub shower with LRAD wtih supervision  Week 2:     Skilled Therapeutic Interventions/Progress Updates:    Pt ambulated to gym.  Engaged in balance activities and coordination activities using tennis game with pt having to switch back and forth betwenn back hand and forehand.  Pt had not loss of Balance.   Did 10 0 30 tennis ball bounces with left and right hand gor 5 minutes.  Ambulated back to room and left with all needs in place.    Therapy Documentation Precautions:  Precautions Precautions: Fall Precaution Comments: L inattention improving; helmet when OOB Restrictions Weight Bearing Restrictions: No       Pain: Pain Assessment Pain Assessment: No/denies pain ADL: ADL ADL Comments: see FIM       See FIM for current functional status  Therapy/Group: Individual Therapy  Lisa Roca 11/23/2014, 1:08 PM

## 2014-11-23 NOTE — Progress Notes (Signed)
Physical Therapy Note  Patient Details  Name: ERASMUS BISTLINE MRN: [id number] Date of Birth: 1946-01-07 Today's Date: 11/23/2014    Time: 1100-1155 55 minutes  1:1 No c/o pain.  Pt ambulated outside with supervision on various surfaces, up/down ramp and curb, ascend/descend stairs with no LOB. Pt requires min cues for pathfinding back to unit after being outside.  Walking with ball toss forward/backward/ sideways.  Pt with 1 LOB that required he hold onto wall.  Standing on trampoline balance activities with supervision.  Static balance tandem and SLS much improved, pt able to self correct LOB.  Nu step for endurance and strength x 8 minutes level 5. Pt demos improved balance reactions this session.   Chinmayi Rumer 11/23/2014, 11:53 AM

## 2014-11-23 NOTE — Progress Notes (Signed)
Social Work Patient ID: Martin Edwards, male   DOB: 1945/10/21, 69 y.o.   MRN: [id number]   Able to meet with pt and his daughter today to review team conference with them both.  Both aware and agreeable with targeted d/c date of 6/16 with supervision goals.  Daughter with some concerns about pt's consideration of d/c to his brother's home.  While she does not doubt that his brother cares very much for pt, she is concerned that he may not truly understand pt's supervision needs.  Daughter is currently speaking with her father about coming to her home in [place] where she and her boyfriend (who is very close with her father) can share the 24/7 supervision.  Plan to follow up with daughter and pt at the beginning of next week to see if they have reached a decision.  Noelle Hoogland, LCSW

## 2014-11-23 NOTE — Progress Notes (Signed)
Occupational Therapy Session Note  Patient Details  Name: Martin Edwards MRN: [id number] Date of Birth: December 07, 1945  Today's Date: 11/23/2014 OT Individual Time: 1000-1100 OT Individual Time Calculation (min): 60 min    Skilled Therapeutic Interventions/Progress Updates: patient participated in shower with focus on safety and left environmental attention, functional mobility and body awareness.    Patient without signs of dizziness or left environment or body challenges during functional mobility or self care.   Patient wore safety helmet during functional mobility and abdomenal sutures were covered and kept dry during shower.    Patient was handed off to physical therapy immediately at the conclusion of this OT session.     Therapy Documentation Precautions:  Precautions Precautions: Fall Precaution Comments: L inattention improving; helmet when OOB Restrictions Weight Bearing Restrictions: No   Pain: Pain Assessment Pain Assessment: No/denies pain  See FIM for current functional status  Therapy/Group: Individual Therapy  Alfredia Ferguson [facility] 11/23/2014, 2:25 PM

## 2014-11-24 ENCOUNTER — Inpatient Hospital Stay (HOSPITAL_COMMUNITY): Payer: Medicare Other | Admitting: Occupational Therapy

## 2014-11-24 ENCOUNTER — Inpatient Hospital Stay (HOSPITAL_COMMUNITY): Payer: Medicare Other | Admitting: Physical Therapy

## 2014-11-24 DIAGNOSIS — S065X4S Traumatic subdural hemorrhage with loss of consciousness of 6 hours to 24 hours, sequela: Secondary | ICD-10-CM

## 2014-11-24 DIAGNOSIS — R6889 Other general symptoms and signs: Secondary | ICD-10-CM

## 2014-11-24 NOTE — Progress Notes (Signed)
Physical Therapy Session Note  Patient Details  Name: Martin Edwards MRN: [id number] Date of Birth: 1946-02-04  Today's Date: 11/24/2014 PT Individual Time: 1300-1415 PT Individual Time Calculation (min): 75 min   Short Term Goals: Week 1:  PT Short Term Goal 1 (Week 1): STG=LTG secondary to LOS  Skilled Therapeutic Interventions/Progress Updates:   Patient sitting in recliner, ex wife Hassan Rowan present for part of session. Patient requested to brush teeth but dropped tooth brush on floor, ambulated to retrieve new tooth brush and performed oral care standing at sink with supervision. Path finding task to locate cafeteria with max multimodal A cues to utilize and interpret external aids. Performed outdoor ambulation on uneven surfaces, inclines/declines, and brick and concrete surfaces with supervision, no LOB noted. Patient able to return to rehab gym from elevators with min cues. Participated in 2 games of "Connect 4" with mod cues for game strategy and attending to L. NuStep using BUE/BLE at level 7 x 10 min with initial cues for increased pace for activity tolerance and generalized strengthening. Patient ambulated back to room bouncing basketball back and forth to therapist with dual task naming challenge with mod multimodal cues and increased time. Patient left sitting in recliner with needs within reach and family member present.    Therapy Documentation Precautions:  Precautions Precautions: Fall Precaution Comments: L inattention improving; helmet when OOB Restrictions Weight Bearing Restrictions: No Pain: Pain Assessment Pain Assessment: No/denies pain Locomotion : Ambulation Ambulation/Gait Assistance: 5: Supervision   See FIM for current functional status  Therapy/Group: Individual Therapy  Laretta Alstrom 11/24/2014, 1:55 PM

## 2014-11-24 NOTE — Progress Notes (Signed)
Subjective/Complaints: Continues to do well. Pleased with progress. No sleep issues. ROS: Pt denies fever, rash/itching, headache, blurred or double vision, nausea, vomiting, abdominal pain, diarrhea, chest pain, shortness of breath, palpitations, dysuria, dizziness, neck or back pain, bleeding, anxiety, or depression   Objective: Vital Signs: Blood pressure 126/68, pulse 75, temperature 98.8 F (37.1 C), temperature source Oral, resp. rate 20, SpO2 100 %. No results found. Results for orders placed or performed during the hospital encounter of 11/18/14 (from the past 72 hour(s))  Basic metabolic panel     Status: Abnormal   Collection Time: 11/22/14  4:48 AM  Result Value Ref Range   Sodium 135 135 - 145 mmol/L   Potassium 4.6 3.5 - 5.1 mmol/L    Comment: SPECIMEN HEMOLYZED. HEMOLYSIS MAY AFFECT INTEGRITY OF RESULTS.   Chloride 101 101 - 111 mmol/L   CO2 24 22 - 32 mmol/L   Glucose, Bld 105 (H) 65 - 99 mg/dL   BUN 11 6 - 20 mg/dL   Creatinine, Ser 4.46 0.61 - 1.24 mg/dL   Calcium 8.6 (L) 8.9 - 10.3 mg/dL   GFR calc non Af Amer >60 >60 mL/min   GFR calc Af Amer >60 >60 mL/min    Comment: (NOTE) The eGFR has been calculated using the CKD EPI equation. This calculation has not been validated in all clinical situations. eGFR's persistently <60 mL/min signify possible Chronic Kidney Disease.    Anion gap 10 5 - 15     HEENT: normal Cardio: RRR and no murmurs Resp: CTA B/L and unlabored GI: BS positive and nnontender nondistended Extremity:  Pulses positive and No Edema Skin:   Scalp wound well as right lower abdominal quadrant wounds intact with staples--mild redness around staples Neuro: Alert/Oriented. Sensory intact. Motor 4-5/5 in all 4's. Fair coordination. Has fair insight and awareness Musc/Skel:  Other no pain with upper extremity or lower extremity range of motion Gen. no acute distress   Assessment/Plan: 1. 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 Physiatrist is providing close team supervision and 24 hour management of active medical problems listed below. Physiatrist and rehab team continue to assess barriers to discharge/monitor patient progress toward functional and medical goals.  FIM: FIM - Bathing Bathing Steps Patient Completed: Chest, Left upper leg, Right Arm, Right lower leg (including foot), Left lower leg (including foot), Left Arm, Abdomen, Front perineal area, Buttocks, Right upper leg Bathing: 5: Supervision: Safety issues/verbal cues  FIM - Upper Body Dressing/Undressing Upper body dressing/undressing steps patient completed: Thread/unthread left sleeve of pullover shirt/dress, Pull shirt over trunk, Thread/unthread right sleeve of pullover shirt/dresss, Put head through opening of pull over shirt/dress Upper body dressing/undressing: 5: Supervision: Safety issues/verbal cues FIM - Lower Body Dressing/Undressing Lower body dressing/undressing steps patient completed: Thread/unthread right underwear leg, Thread/unthread left pants leg, Don/Doff left sock, Thread/unthread left underwear leg, Pull pants up/down, Pull underwear up/down, Fasten/unfasten pants, Thread/unthread right pants leg, Don/Doff right sock Lower body dressing/undressing: 5: Supervision: Safety issues/verbal cues  FIM - Toileting Toileting steps completed by patient: Adjust clothing prior to toileting, Performs perineal hygiene, Adjust clothing after toileting Toileting Assistive Devices: Grab bar or rail for support Toileting: 5: Supervision: Safety issues/verbal cues  FIM - Diplomatic Services operational officer Devices: Grab bars Toilet Transfers: 5-To toilet/BSC: Supervision (verbal cues/safety issues), 5-From toilet/BSC: Supervision (verbal  cues/safety issues)  FIM - Bed/Chair Transfer Bed/Chair Transfer: 0: Activity did not occur  FIM - Locomotion: Wheelchair Locomotion: Wheelchair: 0: Activity did not occur FIM - Locomotion: Ambulation Locomotion: Ambulation Assistive Devices: Other (comment) (none) Ambulation/Gait Assistance: 5: Supervision Locomotion: Ambulation: 5: Travels 150 ft or more with supervision/safety issues  Comprehension Comprehension Mode: Auditory Comprehension: 5-Follows basic conversation/direction: With no assist  Expression Expression Mode: Verbal Expression: 5-Expresses basic needs/ideas: With extra time/assistive device  Social Interaction Social Interaction: 6-Interacts appropriately with others with medication or extra time (anti-anxiety, antidepressant).  Problem Solving Problem Solving: 4-Solves basic 75 - 89% of the time/requires cueing 10 - 24% of the time  Memory Memory: 4-Recognizes or recalls 75 - 89% of the time/requires cueing 10 - 24% of the time  Medical Problem List and Plan: 1. 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 DVT Prophylaxis/Anticoagulation: Mechanical: Sequential compression devices, below knee Bilateral lower extremities 3. Pain Management: Continue Vicodin prn as effective.  4. Mood: LCSW to follow for evaluation and support.  5. Neuropsych: This patient is capable of making decisions on his own behalf. 6. Skin/Wound Care: Remove staples on monday 7. Fluids/Electrolytes/Nutrition: recheck labs this week 8. New onset focal motor seizures: Continue dilantin and Keppra 1000 mg bid.   -continue current rx despite lower dilantin levels 9. HTN: Will monitor BP every 8 hours. Continue Norvasc, HCTZ and lisinopril. Good control at present 10. Hyponatremia: resolved off IVF.    11. Hypokalemia: On HCTZ. Supplement for now.  12. Staph UTI: abx  completed      LOS (Days) 6 A FACE TO FACE EVALUATION WAS PERFORMED  SWARTZ,ZACHARY T 11/24/2014, 9:01 AM

## 2014-11-25 ENCOUNTER — Inpatient Hospital Stay (HOSPITAL_COMMUNITY): Payer: Medicare Other

## 2014-11-25 ENCOUNTER — Inpatient Hospital Stay (HOSPITAL_COMMUNITY): Payer: Medicare Other | Admitting: Speech Pathology

## 2014-11-25 NOTE — Progress Notes (Signed)
Physical Therapy Session Note  Patient Details  Name: Martin Edwards MRN: [id number] Date of Birth: 06-01-1946  Today's Date: 11/25/2014 PT Individual Time: 0832-1000 PT Individual Time Calculation (min): 88 min   Short Term Goals: Week 1:  PT Short Term Goal 1 (Week 1): STG=LTG secondary to LOS  Skilled Therapeutic Interventions/Progress Updates:    Patient in room in bathroom.  Completed hand washing and teeth brushing at sink supervision.  Patient ambulated over level indoor tile, outdoor paved inclined and grassy surfaces over about 20 minutes and up flight of cement stairs with supervision to minguard assist on unlevel grassy terrain with cues to look up to read signs (patient would stop to read signs.)  Patient also participating in wayfinding with mod verbal cues for use of directional signs and questioning cues for which floor for exit and entry.  Patient performing sequencing/timing tasks with forward and side quick step ups onto 6" step with bilat UE support.  Forward and side lunges onto BOSU in parallel bars with UE support and mod cues/demo for technique.  Gait speed 2.23 ft/sec with head/neck flexion, decreased arm swing, decreased heel strike, increased thoracic kyphosis, posterior pelvic tilt and hip external rotation.  Patient performed coordination and timing activities including side stepping with increasing speeds with UE support, stepping "in, in, out, out" of tile square with alternating feet, then "up, up, back, back" with alternating feet with single UE support. Cues and demo needed during task for proper sequence, technique.  Noted slower with left LE with all tasks.  Patient on treadmill 2 x 3 minute bouts with UE support, min assist and cues for posture, step length, heel strike and decreased UE support for speed and gait mechanics up to 0.8-1.0 mph.  Patient ambulated back to room seated in recliner all needs in reach.    Therapy Documentation Precautions:   Precautions Precautions: Fall Precaution Comments: L inattention improving; helmet when OOB Restrictions Weight Bearing Restrictions: No Vital Signs: Therapy Vitals Pulse Rate: (!) 106 BP: (!) 142/61 mmHg (after exercise) Patient Position (if appropriate): Sitting Oxygen Therapy SpO2: 100 % O2 Device: Not Delivered Pain: Pain Assessment Pain Assessment: No/denies pain Locomotion : Ambulation Ambulation/Gait Assistance: 5: Supervision    Therapy/Group: Individual Therapy  Lee, [place] 11/25/2014  11/25/2014, 12:32 PM

## 2014-11-25 NOTE — Progress Notes (Signed)
Subjective/Complaints: Had a good day. Sleeping well. Pain minimal. Feels he's doing well ROS: Pt denies fever, rash/itching, headache, blurred or double vision, nausea, vomiting, abdominal pain, diarrhea, chest pain, shortness of breath, palpitations, dysuria, dizziness, neck or back pain, bleeding, anxiety, or depression   Objective: Vital Signs: Blood pressure 114/67, pulse 74, temperature 98.5 F (36.9 C), temperature source Oral, resp. rate 18, SpO2 99 %. No results found. No results found for this or any previous visit (from the past 72 hour(s)).   HEENT: normal Cardio: RRR and no murmurs Resp: CTA B/L and unlabored GI: BS positive and nnontender nondistended Extremity:  Pulses positive and No Edema Skin:   Scalp wound well as right lower abdominal quadrant wounds intact with staples--mild redness around staples persists Neuro: Alert/Oriented. Sensory intact. Motor 4-5/5 in all 4's. [place] coordination. Has fair insight and awareness Musc/Skel:  Other no pain with upper extremity or lower extremity range of motion Gen. no acute distress   Assessment/Plan: 1. 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 Physiatrist is providing close team supervision and 24 hour management of active medical problems listed below. Physiatrist and rehab team continue to assess barriers to discharge/monitor patient progress toward functional and medical goals.  FIM: FIM - Bathing Bathing Steps Patient Completed: Chest, Left upper leg, Right Arm, Right lower leg (including foot), Left lower leg (including foot), Left Arm, Abdomen, Front perineal area, Buttocks, Right upper leg Bathing: 5: Supervision: Safety issues/verbal cues  FIM - Upper Body Dressing/Undressing Upper body  dressing/undressing steps patient completed: Thread/unthread left sleeve of pullover shirt/dress, Pull shirt over trunk, Thread/unthread right sleeve of pullover shirt/dresss, Put head through opening of pull over shirt/dress Upper body dressing/undressing: 5: Supervision: Safety issues/verbal cues FIM - Lower Body Dressing/Undressing Lower body dressing/undressing steps patient completed: Thread/unthread right underwear leg, Thread/unthread left pants leg, Don/Doff left sock, Thread/unthread left underwear leg, Pull pants up/down, Pull underwear up/down, Fasten/unfasten pants, Thread/unthread right pants leg, Don/Doff right sock, Don/Doff right shoe, Don/Doff left shoe Lower body dressing/undressing: 5: Supervision: Safety issues/verbal cues  FIM - Toileting Toileting steps completed by patient: Adjust clothing prior to toileting, Performs perineal hygiene, Adjust clothing after toileting Toileting Assistive Devices: Grab bar or rail for support Toileting: 5: Supervision: Safety issues/verbal cues  FIM - Radio producer Devices: Grab bars Toilet Transfers: 5-To toilet/BSC: Supervision (verbal cues/safety issues), 5-From toilet/BSC: Supervision (verbal cues/safety issues)  FIM - IT sales professional Transfer: 5: Bed > Chair or W/C: Supervision (verbal cues/safety issues)  FIM - Locomotion: Wheelchair Locomotion: Wheelchair: 0: Activity did not occur FIM - Locomotion: Ambulation Locomotion: Ambulation Assistive Devices: Other (comment) (none) Ambulation/Gait Assistance: 5: Supervision Locomotion: Ambulation: 5: Travels 150 ft or more with supervision/safety issues  Comprehension Comprehension Mode: Auditory Comprehension: 5-Follows basic conversation/direction: With no assist  Expression Expression Mode: Verbal Expression: 5-Expresses basic needs/ideas: With extra time/assistive device  Social Interaction Social Interaction: 6-Interacts  appropriately with others with medication or extra time (anti-anxiety, antidepressant).  Problem Solving Problem Solving: 4-Solves basic 75 - 89% of the time/requires cueing 10 - 24% of the time  Memory Memory: 4-Recognizes or recalls 75 - 89% of the time/requires cueing 10 - 24% of the time  Medical Problem List and Plan: 1. 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 DVT Prophylaxis/Anticoagulation: Mechanical: Sequential compression devices, below knee Bilateral lower extremities 3. Pain Management: Continue Vicodin prn as effective.  4. Mood: in good spirits.  5. Neuropsych: This patient is capable of making decisions on his own behalf. 6. Skin/Wound Care: Remove staples today 7. Fluids/Electrolytes/Nutrition: recheck labs this week 8. New onset focal motor seizures: Continue dilantin and Keppra 1000 mg bid.   -continue current rx despite lower dilantin levels 9. HTN: Will monitor BP every 8 hours. Continue Norvasc, HCTZ and lisinopril. Good control at present 10. Hyponatremia: resolved     11. Hypokalemia: On HCTZ. continue kcl supp 12. Staph UTI: abx completed      LOS (Days) 7 A FACE TO FACE EVALUATION WAS PERFORMED  Tyreka Henneke T 11/25/2014, 7:55 AM

## 2014-11-25 NOTE — Progress Notes (Signed)
Occupational Therapy Weekly Progress Note  Patient Details  Name: Martin Edwards MRN: [id number] Date of Birth: October 05, 1945  Beginning of progress report period: November 19, 2014 End of progress report period: November 25, 2014   Patient has met 4 of 4 short term goals.  Pt has made steady progress with BADLs since admission.  Pt is supervision for BADLs but continues to require min verbal cues for donning helmet when ambulating and for safety awareness.  Patient continues to demonstrate the following deficits: decreased strength, L inattention, decreased safety awareness, decreased coordination, decreased balance, decreased anticipatory and emergent awareness, decreased problem solving, decreased memory, decreased attention, decreased activity tolerance, decreased insight into deficits and therefore will continue to benefit from skilled OT intervention to enhance overall performance with BADLs, cognition, safety, attention to L, balance, and strength.  Patient progressing toward long term goals..  Continue plan of care.  OT Short Term Goals Week 1:  OT Short Term Goal 1 (Week 1): Pt will demonstrate day to day recall with min A  OT Short Term Goal 1 - Progress (Week 1): Met OT Short Term Goal 2 (Week 1): Pt will obtain own clothing with steady A and min questioning cues OT Short Term Goal 3 (Week 1): Pt will perform toileting with supervision  OT Short Term Goal 3 - Progress (Week 1): Met OT Short Term Goal 4 (Week 1): Pt will enter into tub shower with LRAD wtih supervision  OT Short Term Goal 4 - Progress (Week 1): Met Week 2:  OT Short Term Goal 1 (Week 2): STG=LTG secondary to ELOS   Therapy Documentation Precautions:  Precautions Precautions: Fall Precaution Comments: L inattention improving; helmet when OOB Restrictions Weight Bearing Restrictions: No ADL: ADL ADL Comments: see FIM  Leroy Libman 11/25/2014, 7:48 AM

## 2014-11-25 NOTE — Progress Notes (Signed)
Speech Language Pathology Daily Session Note  Patient Details  Name: Martin Edwards MRN: [id number] Date of Birth: Jul 14, 1945  Today's Date: 11/25/2014 SLP Individual Time: 1405-1500 SLP Individual Time Calculation (min): 55 min  Short Term Goals: Week 1: SLP Short Term Goal 1 (Week 1): Patient will demonstrate functional problem solving for mildly complex tasks with Min A verbal cues.  SLP Short Term Goal 1 - Progress (Week 1): Progressing toward goal SLP Short Term Goal 2 (Week 1): Patient will self-monitor and correct errors during functional tasks with Min A question and verbal cues.  SLP Short Term Goal 2 - Progress (Week 1): Progressing toward goal SLP Short Term Goal 3 (Week 1): Patient will demonstrate selective attention in a moderately distracting enviornment for 60 minutes with Min A verbal cues.  SLP Short Term Goal 3 - Progress (Week 1): Progressing toward goal SLP Short Term Goal 4 (Week 1): Patient will attend to left field of envoirnment during functional tasks with supervision verbal cues.  SLP Short Term Goal 4 - Progress (Week 1): Progressing toward goal SLP Short Term Goal 5 (Week 1): Patient will consume current diet without overt s/s of aspiration with Mod I for use of strategies.  SLP Short Term Goal 5 - Progress (Week 1): Met  Skilled Therapeutic Interventions: Skilled treatment session focused on cognitive goals. SLP facilitated session by providing Min A verbal cues and extra time for functional problem solving and organization during a mildly complex, unfamiliar money management task. Patient was able to demonstrate selective attention to task in a moderately distracting environment for 60 minutes with Mod I. Patient ambulated to and from his room with supervision. Patient left in recliner with all needs within reach. Continue with current plan of care.    FIM:  Comprehension Comprehension Mode: Auditory Comprehension: 5-Follows basic conversation/direction: With  no assist Expression Expression Mode: Verbal Expression: 5-Expresses basic needs/ideas: With extra time/assistive device Social Interaction Social Interaction: 6-Interacts appropriately with others with medication or extra time (anti-anxiety, antidepressant). Problem Solving Problem Solving: 5-Solves complex 90% of the time/cues < 10% of the time Memory Memory: 5-Recognizes or recalls 90% of the time/requires cueing < 10% of the time  Pain Pain Assessment Pain Assessment: No/denies pain  Therapy/Group: Individual Therapy  Martin Edwards 11/25/2014, 3:59 PM

## 2014-11-25 NOTE — Progress Notes (Signed)
Speech Language Pathology Daily Session Note  Patient Details  Name: Martin Edwards MRN: [id number] Date of Birth: May 09, 1946  Today's Date: 11/25/2014 SLP Individual Time: 1000-1100 SLP Individual Time Calculation (min): 60 min  Short Term Goals: Week 1: SLP Short Term Goal 1 (Week 1): Patient will demonstrate functional problem solving for mildly complex tasks with Min A verbal cues.  SLP Short Term Goal 1 - Progress (Week 1): Progressing toward goal SLP Short Term Goal 2 (Week 1): Patient will self-monitor and correct errors during functional tasks with Min A question and verbal cues.  SLP Short Term Goal 2 - Progress (Week 1): Progressing toward goal SLP Short Term Goal 3 (Week 1): Patient will demonstrate selective attention in a moderately distracting enviornment for 60 minutes with Min A verbal cues.  SLP Short Term Goal 3 - Progress (Week 1): Progressing toward goal SLP Short Term Goal 4 (Week 1): Patient will attend to left field of envoirnment during functional tasks with supervision verbal cues.  SLP Short Term Goal 4 - Progress (Week 1): Progressing toward goal SLP Short Term Goal 5 (Week 1): Patient will consume current diet without overt s/s of aspiration with Mod I for use of strategies.  SLP Short Term Goal 5 - Progress (Week 1): Met  Skilled Therapeutic Interventions: Skilled treatment session focused on cognitive goals. SLP facilitated session by providing Min A verbal cues and extra time for functional problem solving and organization during a mildly complex, unfamiliar money management task. Patient was able to demonstrate selective attention to task in a moderately distracting environment for 60 minutes with Mod I. Patient ambulated to and from his room with supervision. Patient left in recliner with all needs within reach. Continue with current plan of care. Pt was seen for skilled ST targeting cognitive goals.  Upon arrival, pt was seated upright in recliner, awake,  alert, and agreeable to participate in [place].  Pt ambulated to therapy room with min-supervision cues for route recall.  SLP facilitated the session with continued practice for medication management to target functional problem solving for home management.  Pt recalled function of ~75% of scheduled medications with min-supervision question cues and without the use of a written list.  He was able to identify that he was on medication for seizures, blood pressure, and cholesterol but required min-mod cues for recall of frequency and names of medications.  Pt's accuracy for loading pills into a pill box based on frequency and time was noted to have improved from previously targeted session and he made only 1 error over the course of 8 targeted medications.  He recognized error with min verbal cues and was able to correct mistake with supervision.  SLP recommended that pt have assistance for medication and financial management at discharge.  Per pt's report, he will be staying with his daughter and her boyfriend who will be able to provide the abovementioned recommended level of assistance.  Continue per current plan of care.    FIM:  Comprehension Comprehension Mode: Auditory Comprehension: 5-Follows basic conversation/direction: With no assist Expression Expression Mode: Verbal Expression: 5-Expresses basic needs/ideas: With extra time/assistive device Social Interaction Social Interaction: 6-Interacts appropriately with others with medication or extra time (anti-anxiety, antidepressant). Problem Solving Problem Solving: 5-Solves complex 90% of the time/cues < 10% of the time Memory Memory: 5-Recognizes or recalls 90% of the time/requires cueing < 10% of the time  Pain Pain Assessment Pain Assessment: No/denies pain  Therapy/Group: Individual Therapy  Hanifah Royse, Selinda Orion 11/25/2014, 4:20  PM

## 2014-11-25 NOTE — Progress Notes (Signed)
Occupational Therapy Session Note  Patient Details  Name: Martin Edwards MRN: [id number] Date of Birth: [birth year]  Today's Date: 11/25/2014 OT Individual Time: 0700-0800 OT Individual Time Calculation (min): 60 min    Short Term Goals: Week 2:  OT Short Term Goal 1 (Week 2): STG=LTG secondary to ELOS  Skilled Therapeutic Interventions/Progress Updates:    Pt engaged in BADL retraining including bathing at shower level, toileting, and dressing with sit<>stand from EOB.  Pt continues to require min verbal cues to don helmet when ambulating and max verbal cues to loosen strap when doffing helmet.  Pt completes all tasks with supervision.  Pt amb to therapy gym to retrieve more coverings for abdominal incision.  Pt was able to locate gym and return to room without verbal cues.  Pt retrieved breakfast tray from cart and carried to room with close supervision.  Focus on activity tolerance, safety awareness, standing balance, functional amb, and pathfinding.  Therapy Documentation Precautions:  Precautions Precautions: Fall Precaution Comments: L inattention improving; helmet when OOB Restrictions Weight Bearing Restrictions: No Pain: Pain Assessment Pain Assessment: No/denies pain ADL: ADL ADL Comments: see FIM  See FIM for current functional status  Therapy/Group: Individual Therapy  Leroy Libman 11/25/2014, 8:02 AM

## 2014-11-26 ENCOUNTER — Inpatient Hospital Stay (HOSPITAL_COMMUNITY): Payer: Medicare Other

## 2014-11-26 ENCOUNTER — Encounter (HOSPITAL_COMMUNITY): Payer: Medicare Other

## 2014-11-26 ENCOUNTER — Inpatient Hospital Stay (HOSPITAL_COMMUNITY): Payer: Medicare Other | Admitting: Physical Therapy

## 2014-11-26 ENCOUNTER — Inpatient Hospital Stay (HOSPITAL_COMMUNITY): Payer: Medicare Other | Admitting: Speech Pathology

## 2014-11-26 DIAGNOSIS — S065X4A Traumatic subdural hemorrhage with loss of consciousness of 6 hours to 24 hours, initial encounter: Secondary | ICD-10-CM

## 2014-11-26 NOTE — Progress Notes (Signed)
Physical Therapy Session Note  Patient Details  Name: Martin Edwards MRN: [id number] Date of Birth: 05-03-1946  Today's Date: 11/26/2014 PT Individual Time: 0900-1000 and 1500-1600 PT Individual Time Calculation (min): 60 min and 60 min   Short Term Goals: Week 1:  PT Short Term Goal 1 (Week 1): STG=LTG secondary to LOS  Skilled Therapeutic Interventions/Progress Updates:   Session 1: Focus on functional mobility, cognitive remediation, dynamic balance, coordination, and increased gait speed. Patient sitting in recliner, performed oral hygiene and toileting with mod I before exiting room. Patient ambulated throughout rehab unit without device with downward gaze and decreased gait speed, mod I. In stairwell, patient negotiated up/down flight of steps using R rail with reciprocal pattern and S. Patient performed floor transfer with mod I, no cues for sequencing. Patient participated in previously completed simple sorting task with good carryover of previously learned strategy and information with independence and simple meal planning task with min cues. Treadmill training x 3 min for total of 345 ft with BUE support, min assist and cues for posture, step length, heel strike and decreased UE support for increased gait speed and mechanics at 1.3 mph. Patient unable to safely continue task with BUE support removed. Recreational therapist joined for second half of session with focus on dynamic balance, coordination, and gait speed. Patient participated in passing tennis ball back and forth with RT using hockey sticks on tile and carpeted surfaces and dribbled ball to self with close supervision. Patient performed side galloping to R and L, and light jog forwards and backwards while tossing/catching ball with good increased in speed noted. Patient returned to room and left sitting in recliner with all needs within reach.   Session 2: Focus on organization, attention, problem solving, mental flexibility, and  safety awareness. Patient received asleep in recliner, easily aroused and ambulated in room to perform toileting and hand hygiene with mod I. Patient ambulated throughout rehab unit from room <> ADL apartment with mod I, decreased gait speed and downward gaze with minimal change when cued to increase pace. Patient participated in simple organizational task to put away various items in ADL kitchen with min multimodal A cues to complete task and more than reasonable amount of time. Patient participated in baking task of making muffins from mix with max multimodal A cues with therapist reading directions aloud to patient due to small font on muffin mix. Patient requested reassurance from therapist with each step of task and no awareness of errors. Continue to recommend 24/7 supervision upon discharge due to cognitive impairments. Patient returned to room and left sitting in recliner with dinner tray and needs within reach.     Therapy Documentation Precautions:  Precautions Precautions: Fall Precaution Comments: L inattention improving; helmet when OOB Restrictions Weight Bearing Restrictions: No Pain: Pain Assessment Pain Assessment: No/denies pain  See FIM for current functional status  Therapy/Group: Individual Therapy  Laretta Alstrom 11/26/2014, 10:39 AM

## 2014-11-26 NOTE — Progress Notes (Signed)
Occupational Therapy Session Note  Patient Details  Name: Martin Edwards MRN: [id number] Date of Birth: 17-Feb-1946  Today's Date: 11/26/2014 OT Concurrent Time: 1405-1435 OT Concurrent Time Calculation (min): 30 min   Short Term Goals: Week 2:  OT Short Term Goal 1 (Week 2): STG=LTG secondary to ELOS  Skilled Therapeutic Interventions/Progress Updates:    Pt seen for concurrent OT session with focus on functional mobility, path finding, attention to L, and cognitive remediation. Pt received sitting in recliner chair with helmet donned however required assist to fasten. Pt ambulated to bathroom and completed toileting at Mod I level. Engaged in card game of "spot it" with another pt focusing on attention, categorization, and problem solving. Pt demonstrated good new learning of activity, requiring min cues for adherence to rules. Pt required min cues and increased time for problem solving and categorization to locate matching item on left side. At end of session pt returned game back to therapy gym with no cues for path finding then returned to room. Pt left sitting in recliner with all needs in reach. Pt demonstrated socially appropriate behaviors throughout therapy session.   Therapy Documentation Precautions:  Precautions Precautions: Fall Precaution Comments: L inattention improving; helmet when OOB Restrictions Weight Bearing Restrictions: No General:   Vital Signs:   Pain: No report of pain  Other Treatments:    See FIM for current functional status  Therapy/Group: Concurrent Therapy  Ethon Wymer N 11/26/2014, 2:49 PM

## 2014-11-26 NOTE — Progress Notes (Signed)
Occupational Therapy Session Note  Patient Details  Name: Martin Edwards MRN: [id number] Date of Birth: 1945/10/31  Today's Date: 11/26/2014 OT Individual Time: 0700-0800 OT Individual Time Calculation (min): 60 min    Short Term Goals: Week 2:  OT Short Term Goal 1 (Week 2): STG=LTG secondary to ELOS  Skilled Therapeutic Interventions/Progress Updates:    Pt initially engaged in BADL retraining including bathing at shower level and dressing with sit<>stand from EOB.  Pt amb in room without AD to gather clothing and supplies prior to entering bathroom to use toilet and take shower.  Pt completed approx 25% of shower while standing with no LOB noted.  Pt transitioned to therapy gym and completed some [employer] activities. Pt continues to exhibit delayed weight shifts in response to external stimuli.  Pt returned to room, stopping at meal cart to retrieve his breakfast tray.  Pt carried his tray to his room with no LOB.  Focus on functional amb for home mgmt tasks, BADL retraining, sit<>stand, standing balance, path finding, and safety awareness.  Therapy Documentation Precautions:  Precautions Precautions: Fall Precaution Comments: L inattention improving; helmet when OOB Restrictions Weight Bearing Restrictions: No Pain: Pain Assessment Pain Assessment: No/denies pain ADL: ADL ADL Comments: see FIM  See FIM for current functional status  Therapy/Group: Individual Therapy  Leroy Libman 11/26/2014, 7:59 AM

## 2014-11-26 NOTE — Progress Notes (Signed)
Speech Language Pathology Daily Session Note  Patient Details  Name: Martin Edwards MRN: [id number] Date of Birth: 07/31/1945  Today's Date: 11/26/2014 SLP Individual Time: 1000-1030 SLP Individual Time Calculation (min): 30 min  Short Term Goals: Week 1: SLP Short Term Goal 1 (Week 1): Patient will demonstrate functional problem solving for mildly complex tasks with Min A verbal cues.  SLP Short Term Goal 1 - Progress (Week 1): Progressing toward goal SLP Short Term Goal 2 (Week 1): Patient will self-monitor and correct errors during functional tasks with Min A question and verbal cues.  SLP Short Term Goal 2 - Progress (Week 1): Progressing toward goal SLP Short Term Goal 3 (Week 1): Patient will demonstrate selective attention in a moderately distracting enviornment for 60 minutes with Min A verbal cues.  SLP Short Term Goal 3 - Progress (Week 1): Progressing toward goal SLP Short Term Goal 4 (Week 1): Patient will attend to left field of envoirnment during functional tasks with supervision verbal cues.  SLP Short Term Goal 4 - Progress (Week 1): Progressing toward goal SLP Short Term Goal 5 (Week 1): Patient will consume current diet without overt s/s of aspiration with Mod I for use of strategies.  SLP Short Term Goal 5 - Progress (Week 1): Met  Skilled Therapeutic Interventions: Skilled treatment session focused on cognitive goals.  Subtests o the Cognitive Linguistic Quick Test were administered in preparation for DC this week. Pt scores overall indicate moderate impairment, with language skills WNL, Memory moderately impaired, and Significantly impaired attention, executive function and visuospatial skills. Recommend supervision at home at DC. Pt was left in recliner with needs in reach.   FIM:  Comprehension Comprehension Mode: Auditory Comprehension: 5-Follows basic conversation/direction: With no assist Expression Expression Mode: Verbal Expression: 5-Expresses basic  needs/ideas: With extra time/assistive device Social Interaction Social Interaction: 6-Interacts appropriately with others with medication or extra time (anti-anxiety, antidepressant). Problem Solving Problem Solving: 4-Solves basic 75 - 89% of the time/requires cueing 10 - 24% of the time Memory Memory: 4-Recognizes or recalls 75 - 89% of the time/requires cueing 10 - 24% of the time  Pain Pain Assessment Pain Assessment: No/denies pain Pain Score: 0-No pain  Therapy/Group: Individual Therapy   Sanyla Summey B. [place], [facility], CCC-SLP [phone]  Shonna Chock 11/26/2014, 3:11 PM

## 2014-11-26 NOTE — Progress Notes (Signed)
Subjective/Complaints: Happy to have staples out-----"44 in all!". Feeling well ROS: Pt denies fever, rash/itching, headache, blurred or double vision, nausea, vomiting, abdominal pain, diarrhea, chest pain, shortness of breath, palpitations, dysuria, dizziness, neck or back pain, bleeding, anxiety, or depression   Objective: Vital Signs: Blood pressure 135/67, pulse 77, temperature 98.3 F (36.8 C), temperature source Oral, resp. rate 18, SpO2 100 %. No results found. No results found for this or any previous visit (from the past 72 hour(s)).   HEENT: normal Cardio: RRR and no murmurs Resp: CTA B/L and unlabored GI: BS positive and nnontender nondistended Extremity:  Pulses positive and No Edema Skin:   Scalp and abdominal wounds intact Neuro: Alert/Oriented. Sensory intact. Motor 4-5/5 in all 4's. [place] coordination. Has fair insight and awareness Musc/Skel:  Other no pain with upper extremity or lower extremity range of motion Gen. no acute distress   Assessment/Plan: 1. 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 Physiatrist is providing close team supervision and 24 hour management of active medical problems listed below. Physiatrist and rehab team continue to assess barriers to discharge/monitor patient progress toward functional and medical goals.  FIM: FIM - Bathing Bathing Steps Patient Completed: Chest, Left upper leg, Right Arm, Right lower leg (including foot), Left lower leg (including foot), Left Arm, Abdomen, Front perineal area, Buttocks, Right upper leg Bathing: 5: Supervision: Safety issues/verbal cues  FIM - Upper Body Dressing/Undressing Upper body dressing/undressing steps patient completed: Thread/unthread left sleeve of pullover shirt/dress, Pull shirt  over trunk, Thread/unthread right sleeve of pullover shirt/dresss, Put head through opening of pull over shirt/dress Upper body dressing/undressing: 5: Supervision: Safety issues/verbal cues FIM - Lower Body Dressing/Undressing Lower body dressing/undressing steps patient completed: Thread/unthread right underwear leg, Thread/unthread left pants leg, Don/Doff left sock, Thread/unthread left underwear leg, Pull pants up/down, Pull underwear up/down, Fasten/unfasten pants, Thread/unthread right pants leg, Don/Doff right sock, Don/Doff right shoe, Don/Doff left shoe Lower body dressing/undressing: 5: Supervision: Safety issues/verbal cues  FIM - Toileting Toileting steps completed by patient: Adjust clothing prior to toileting, Performs perineal hygiene, Adjust clothing after toileting Toileting Assistive Devices: Grab bar or rail for support Toileting: 5: Supervision: Safety issues/verbal cues  FIM - Radio producer Devices: Grab bars Toilet Transfers: 5-To toilet/BSC: Supervision (verbal cues/safety issues), 5-From toilet/BSC: Supervision (verbal cues/safety issues)  FIM - IT sales professional Transfer: 5: Chair or W/C > Bed: Supervision (verbal cues/safety issues)  FIM - Locomotion: Wheelchair Locomotion: Wheelchair: 0: Activity did not occur FIM - Locomotion: Ambulation Locomotion: Ambulation Assistive Devices: Other (comment) (none) Ambulation/Gait Assistance: 5: Supervision Locomotion: Ambulation: 4: Travels 150 ft or more with minimal assistance (Pt.>75%)  Comprehension Comprehension Mode: Auditory Comprehension: 5-Follows basic conversation/direction: With no assist  Expression Expression Mode: Verbal Expression: 5-Expresses basic needs/ideas: With extra time/assistive device  Social Interaction Social Interaction: 6-Interacts appropriately with others with medication or extra time (anti-anxiety, antidepressant).  Problem Solving Problem  Solving: 5-Solves complex 90% of the time/cues < 10% of the time  Memory Memory: 5-Recognizes or recalls 90% of the time/requires cueing < 10% of the time  Medical Problem List and Plan: 1. 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 DVT Prophylaxis/Anticoagulation: Mechanical: Sequential compression devices, below knee Bilateral lower extremities 3. Pain Management: minimal pain at  this point---continue plan 4. Mood: in good spirits.  5. Neuropsych: This patient is capable of making decisions on his own behalf. 6. Skin/Wound Care: staples out without issue 7. Fluids/Electrolytes/Nutrition: recheck labs tomorrow 8. New onset focal motor seizures: Continue dilantin and Keppra 1000 mg bid.   -continue current rx despite lower dilantin levels 9. HTN: Will monitor BP every 8 hours. Continue Norvasc, HCTZ and lisinopril. Good control at present 10. Hyponatremia: resolved     11. Hypokalemia: On HCTZ. continue kcl supp--check labs tomorrow 12. Staph UTI: abx completed      LOS (Days) 8 A FACE TO FACE EVALUATION WAS PERFORMED  SWARTZ,ZACHARY T 11/26/2014, 8:36 AM

## 2014-11-27 ENCOUNTER — Inpatient Hospital Stay (HOSPITAL_COMMUNITY): Payer: Medicare Other

## 2014-11-27 ENCOUNTER — Encounter (HOSPITAL_COMMUNITY): Payer: Medicare Other | Admitting: *Deleted

## 2014-11-27 DIAGNOSIS — A499 Bacterial infection, unspecified: Secondary | ICD-10-CM | POA: Diagnosis present

## 2014-11-27 DIAGNOSIS — R7989 Other specified abnormal findings of blood chemistry: Secondary | ICD-10-CM | POA: Diagnosis present

## 2014-11-27 DIAGNOSIS — R945 Abnormal results of liver function studies: Secondary | ICD-10-CM | POA: Diagnosis present

## 2014-11-27 DIAGNOSIS — R569 Unspecified convulsions: Secondary | ICD-10-CM

## 2014-11-27 DIAGNOSIS — N390 Urinary tract infection, site not specified: Secondary | ICD-10-CM

## 2014-11-27 LAB — COMPREHENSIVE METABOLIC PANEL
ALBUMIN: 2.9 g/dL — AB (ref 3.5–5.0)
ALT: 154 U/L — AB (ref 17–63)
AST: 53 U/L — AB (ref 15–41)
Alkaline Phosphatase: 207 U/L — ABNORMAL HIGH (ref 38–126)
Anion gap: 7 (ref 5–15)
BUN: 10 mg/dL (ref 6–20)
CALCIUM: 9.1 mg/dL (ref 8.9–10.3)
CO2: 28 mmol/L (ref 22–32)
Chloride: 100 mmol/L — ABNORMAL LOW (ref 101–111)
Creatinine, Ser: 0.9 mg/dL (ref 0.61–1.24)
GFR calc Af Amer: >60 mL/min (ref >60)
Glucose, Bld: 109 mg/dL — ABNORMAL HIGH (ref 65–99)
Potassium: 4.4 mmol/L (ref 3.5–5.1)
SODIUM: 135 mmol/L (ref 135–145)
Total Bilirubin: 0.3 mg/dL (ref 0.3–1.2)
Total Protein: 6.4 g/dL — ABNORMAL LOW (ref 6.5–8.1)

## 2014-11-27 NOTE — Discharge Instructions (Signed)
Inpatient Rehab Discharge Instructions  LADISLAO COHENOUR Discharge date and time:  11/28/14  Activities/Precautions/ Functional Status: Activity: no lifting, driving, or strenuous exercise till cleared by MD Diet: regular diet Wound Care: keep wound clean and dry Functional status:  ___ No restrictions     ___ Walk up steps independently _X_ 24/7 supervision/assistance   ___ Walk up steps with assistance ___ Intermittent supervision/assistance  ___ Bathe/dress independently ___ Walk with walker     _X__ Bathe/dress with assistance ___ Walk Independently    ___ Shower independently ___ Walk with assistance    ___ Shower with assistance _X__ No alcohol     ___ Return to work/school ________    COMMUNITY REFERRALS UPON DISCHARGE:    Outpatient: PT       ST                  Agency: [facility]    Phone:  [phone]               Appointment Date/Time:   They will contact you with appointment information      Special Instructions:    My questions have been answered and I understand these instructions. I will adhere to these goals and the provided educational materials after my discharge from the hospital.  Patient/Caregiver Signature _______________________________ Date __________  Clinician Signature _______________________________________ Date __________  Please bring this form and your medication list with you to all your follow-up doctor's appointments.

## 2014-11-27 NOTE — Progress Notes (Signed)
Occupational Therapy Session Note  Patient Details  Name: Martin Edwards MRN: [id number] Date of Birth: 12/03/1945  Today's Date: 11/27/2014 OT Individual Time: 1330-1400 OT Individual Time Calculation (min): 30 min    Short Term Goals: Week 2:  OT Short Term Goal 1 (Week 2): STG=LTG secondary to ELOS  Skilled Therapeutic Interventions/Progress Updates:    Pt seen for 1:1 OT session with focus on path finding, dynamic balance, functional mobility, and safety awareness. Pt received sitting in recliner with daughter present. Re-educated pt and daughter on wearing schedule of helmet. Pt required min cues to don before ambulating to bathroom and total A to fasten strap. Pt completed toileting at mod I level. Ambulated on/off unit and outside with min cues for path finding. Pt ambulated up 2 flights of steps at mod I level for endurance training. Pt ambulated down hallway while kicking ball with no LOB during speed adjustments. Pt returned to room and left sitting in recliner with all needs in reach.   Therapy Documentation Precautions:  Precautions Precautions: Fall Precaution Comments: L inattention improving; helmet when OOB Restrictions Weight Bearing Restrictions: No General:   Vital Signs:   Pain: No report of pain  See FIM for current functional status  Therapy/Group: Individual Therapy  Duayne Cal 11/27/2014, 2:23 PM

## 2014-11-27 NOTE — Discharge Summary (Signed)
Physician Discharge Summary  Patient ID: Martin Edwards MRN: [id number] DOB/AGE: 1945-11-22 69 y.o.  Admit date: 11/18/2014 Discharge date: 11/27/2014  Discharge Diagnoses:  Principal Problem:   Traumatic subdural bleed with LOC of 6 hours to 24 hours Active Problems:   Fine motor impairment   Left-sided sensory deficit present   Seizures   Abnormal LFTs   UTI (urinary tract infection), bacterial   Discharged Condition: stable   Labs:  Basic Metabolic Panel:  Recent Labs Lab 11/22/14 0448 11/27/14 0520  NA 135 135  K 4.6 4.4  CL 101 100*  CO2 24 28  GLUCOSE 105* 109*  BUN 11 10  CREATININE 0.88 0.90  CALCIUM 8.6* 9.1    CBC: CBC Latest Ref Rng 11/19/2014 11/11/2014 06/18/2014  WBC 4.0 - 10.5 K/uL 11.6(H) 8.8 5.5  Hemoglobin 13.0 - 17.0 g/dL 12.7(L) 14.1 16.0  Hematocrit 39.0 - 52.0 % 36.6(L) 41.6 46.1  Platelets 150 - 400 K/uL 375 172 212     CBG: No results for input(s): GLUCAP in the last 168 hours.   Brief HPI:   Martin Edwards is a 69 year old male with h/o HTN, prostate cancer, renal calculi; who slipped and fell while showering on 11/11/14. Patient reported complaints of frontal HA with dizziness X few weeks as well as multiple recent falls. 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 He was taken to OR emergently for right frontotemporal parietal crani for evacuation of SDH. He developed lethary on 06/01 due to recurrent SDH and was taken to OR for craniectomy with placement of bone flap in abdomen and for evacuation with recurrent SDH. Post op developed frequent focal seizures consisting of left gaze deviation with facial and occasional arm twitching. Neurology consulted and recommended adding Keppra to dilantin as well as treatment of UTI. mentation has improved and patient continues to be limited by left inattention and unsteady gait. CIR recommended for follow up therapy.     Hospital Course: Martin Edwards was admitted to rehab 11/18/2014 for inpatient therapies to consist of PT, ST and OT at least three hours five days a week. Past admission physiatrist, therapy team and rehab RN have worked together to provide customized collaborative inpatient rehab. Crani incision has healed well without signs or symptoms of infection. Blood pressures were monitored on tid basis and were noted to be low therefore HCTZ was discontinued. Mild hypokalemia has resolved with supplementation. Blood pressures are currently controlled. Po intake has been good and he is continent of bowel and bladder.  Rocephin was changed to Levaquin for treatment of staph UTI. He has been seizure free during his rehab stay.  Dilantin level was at admission was 6.7 with low albumin at 2.9.   He has made good gains during his rehab stay. He continues to require min cues to attend to the left and as well as increased time for problem solving.  He is independent in supervised setting and 24 hours supervision is recommended due to safety. He will continue to receive outpatient PT and ST at [facility] past discharge.    Rehab course: During patient's stay in rehab weekly team conferences were held to monitor patient's progress, set goals and discuss barriers to discharge. At admission, patient required min assist with self care tasks and basic mobility. He demonstrated behavior consistent with RLA VIII with impairments in selective attention, problem solving, emergent awareness, safety awareness as well as left  inattention. He had has had improvement in activity tolerance, balance, postural control, as well as ability to compensate for deficits. He is able to complete ADL tasks independently. He is able to ambulate  300 feet without AD. He is able to climb 2 flights of stairs independently.  He is currently requires supervision for alternating attention, recall of new information, functional problem solving as well as emergent awareness of errors.  Family education was done with daughter regarding all aspects of care and need for supervision with medication and financial management.     Disposition: Home  Diet: Regular.  Special Instructions: 1. No swimming, baths, hot tubs, high altitude. Avoid flashing lights. No driving.  2. Wear helmet when ambulating.       Medication List    ASK your doctor about these medications        amLODipine 10 MG tablet  Commonly known as:  NORVASC  Take 1 tablet (10 mg total) by mouth daily.     atorvastatin 40 MG tablet  Commonly known as:  LIPITOR  Take 1 tablet (40 mg total) by mouth daily.     hydrochlorothiazide 12.5 MG capsule  Commonly known as:  MICROZIDE  Take 1 capsule (12.5 mg total) by mouth daily.     lisinopril 40 MG tablet  Commonly known as:  PRINIVIL,ZESTRIL  Take 1 tablet (40 mg total) by mouth daily.     pravastatin 40 MG tablet  Commonly known as:  PRAVACHOL  Take 1 tablet (40 mg total) by mouth daily.       Follow-up Information    Follow up with Meredith Staggers, MD On 01/29/2015.   Specialty:  Physical Medicine and Rehabilitation   Why:  Be there at 11:20  for 11;40  appointment   Contact information:   [address], [place] [address] [phone]       Follow up with Floyce Stakes, MD. Call today.   Specialty:  Neurosurgery   Why:  for follow up appointment   Contact information:   [address] [place] [address] [place] [address] [phone]       Follow up with Jenny Reichmann, MD On 12/03/2014.   Specialty:  Family Medicine   Why:  @ 10:15 am   Contact information:   [address] [place] [address] [phone]       Signed: Bary Leriche 11/27/2014, 4:33 PM

## 2014-11-27 NOTE — Progress Notes (Signed)
Social Work Patient ID: Martin Edwards, male   DOB: Dec 26, 1945, 69 y.o.   MRN: [id number]  Have reviewed team conference with pt and daughter today.  Daughter here to attend therapy on outing with her father.  She continues to be very supportive and ready for pt to come to her home.  Arranging f/u OP tx at [facility].  No further concerns.  Lyne Khurana, LCSW

## 2014-11-27 NOTE — Consult Note (Signed)
NEUROBEHAVIORAL STATUS EXAM - [facility]   MEDICAL NECESSITY:  Sharrieff Spratlin was seen on the [facility] for a neurobehavioral status exam owing to the patient's diagnosis of TBI, and to assist in treatment planning during admission.   According to medical records, Mr. 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 Patient with complaints of frontal HA with dizziness for a couple of weeks PTA. He reported being off balance with multiple recent falls.  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 He was taken to OR emergently for right frontotemporal parietal crani for evacuation of SDH.  He developed lethargy on 06/01 and CT head revealed recurrent SDH. He was taken to OR for craniectomy with placement of bone flap in abdomen and for evacuation with recurrent SDH. Post op developed frequent focal seizures consisting of left gaze deviation with facial and occasional arm twitching.  Neurology consulted and recommended adding Keppra to Dilantin as well as treatment of UTI.  Seizures have resolved with improvement in mentation. Therapy initiated and patient continues to be limited by left inattention and unsteady gait."    During today's visit, Mr. Yoo denied suffering from any cognitive deficits pre or post-TBI. He has little memory of the actual event but said that soon after the head injury he was able to get out of the shower and call his son. No retrograde amnesia reported.    From an emotional standpoint, Mr. Carras described himself as being in good spirits. He has no history of mental  health issues or treatment. No adjustment issues endorsed. Suicidal/homicidal ideation, plan or intent was denied. No manic or hypomanic episodes were reported. The patient denied ever experiencing any auditory/visual hallucinations. No major behavioral or personality changes were endorsed.    Mr. Santana feels that he is making good progress in therapy. No barriers identified. He described the rehab staff as "good." His son appears to be the biggest avenue of social support, though he plans to transition home with his daughter who lives in [place].    PROCEDURES ADMINISTERED: [2 units 46803] Diagnostic clinical interview  Review of available records Montreal Cognitive Assessment (MoCA-Blind)  MENTAL STATUS: Mr. Mokry mental status exam score of 16/22 is below the cutoff suggested to indicate cognitive impairment. Memory was intact, as was fluency, simple attention, and orientation. Complex attention, mental calculation, and processing speed were reduced.    SUMMARY & IMPRESSION: Overall, Mr. Dearinger denied suffering from cognitive deficits, though mental status exam suggests the presence of slow processing speed, and decreased complex attention and working memory. There are no major signs of clinical psychopathology.   Mr. 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 Time was spent discussing TBI recovery as well as compensatory strategies that can be used to circumvent these inefficiencies. Patient may want to consider undergoing thorough cognitive testing as an outpatient in approximately 8 months to assess for interval change. In the meantime, it might be valuable to repeat mental status exam prior to discharge to assess for improvement.     Rutha Bouchard, Psy.D.  Clinical Neuropsychologist

## 2014-11-27 NOTE — Patient Care Conference (Signed)
Inpatient RehabilitationTeam Conference and Plan of Care Update Date: 11/26/2014   Time: 2:50 PM    Patient Name: Martin Edwards      Medical Record Number: [id number]  Date of Birth: 07/12/1945 Sex: Male         Room/Bed: 4W04C/4W04C-01 Payor Info: Payor: [employer]vertising copy[employer] MEDICARE / Plan: [employer] MEDICARE / Product Type: *No Product type* /    Admitting Diagnosis: TBI with SDH craniotomy with skull flap in abdomen  Admit Date/Time:  11/18/2014  4:19 PM Admission Comments: No comment available   Primary Diagnosis:  Traumatic subdural bleed with LOC of 6 hours to 24 hours Principal Problem: Traumatic subdural bleed with LOC of 6 hours to 24 hours  Patient Active Problem List   Diagnosis Date Noted  . 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 Left-sided sensory deficit present 11/19/2014  . Traumatic subdural bleed with LOC of 6 hours to 24 hours 11/18/2014  . Subdural hematoma 11/11/2014  . Small bowel obstruction due to adhesions 01/30/2014  . Sebaceous cyst x2 01/04/2014  . Prostate cancer 12/12/2012  . Hypertension 10/21/2011  . Hyperlipidemia 10/21/2011  . Adenomatous colon polyp 10/21/2011  . Postop check 12/29/2010    Expected Discharge Date: Expected Discharge Date: 11/28/14  Team Members Present: Physician leading conference: Dr. Faith Rogue Social Worker Present: Amada Jupiter, LCSW Nurse Present: Carmie End, RN PT Present: Bayard Hugger, PT OT Present: Ardis Rowan, Darolyn Rua, OT SLP Present: Feliberto Gottron, SLP PPS Coordinator present : Tora Duck, RN, CRRN     Current Status/Progress Goal Weekly Team Focus  Medical   improving attention, staples out. balance better. no pain  stabilize medically for dc  med mgt. wound care   Bowel/Bladder   continent of bowel and bladder.  manage bowel and bladder mod i  contiune POC   Swallow/Nutrition/ Hydration   regular textures with thin liquids, Mod I  Mod I  Goals Met   ADL's   supervision overall  mod I overall   functional problem solving, dynamic standing balance, safety awareness, family education   Mobility   supervision   Mod I-supervision   L attention, standing balance, coordination, cognitive remediation, problem solving, attention, awareness, pt/family education    Communication             Safety/Cognition/ Behavioral Observations  Min A for complex problem solving, supervision for recall, Mod I for attention  Supervision  complex problem solving, awareness   Pain   no complaints         Skin   rash to back-kenalog cream applied as scheduled. Hypoallergenic sheets in use. incision to right scalp and right abdomen OTA no staples present  remain free of skin breakdown and free from infection min assist  assess skin q shift. contiune POC for rash to back    Rehab Goals Patient on target to meet rehab goals: Yes *See Care Plan and progress notes for long and short-term goals.  Barriers to Discharge: safety, decision making    Possible Resolutions to Barriers:  daughter ed, going home with daughter    Discharge Planning/Teaching Needs:  plan now to d/c home with daughter in [place] with 24/7 supervision  to be completed tomorrow   Team Discussion:  Making very good progress and meeting supervision goals.  Still requires cues for helmut. SW reports d/c plan changed to pt d/c to daughter's home in [place].  Recommending OPPT and ST.  Daughter to go on community outing with pt tomorrow for education  Revisions to Treatment Plan:  None   Continued Need for Acute Rehabilitation Level of Care: The patient requires daily medical management by a physician with specialized training in physical medicine and rehabilitation for the following conditions: Daily direction of a multidisciplinary physical rehabilitation program to ensure safe treatment while eliciting the highest outcome that is of practical value to the patient.: Yes Daily medical management of patient stability for increased  activity during participation in an intensive rehabilitation regime.: Yes Daily analysis of laboratory values and/or radiology reports with any subsequent need for medication adjustment of medical intervention for : Post surgical problems;Neurological problems  Jarvis Knodel 11/27/2014, 3:57 PM

## 2014-11-27 NOTE — Progress Notes (Signed)
Recreational Therapy Session Note  Patient Details  Name: Martin Edwards MRN: [id number] Date of Birth: 01-19-1946 Today's Date: 11/27/2014 LATE ENTRY from 11/26/14  Pain: no c/o Skilled Therapeutic Interventions/Progress Updates: Session focused on activity tolerance, dynamic standing balance, increasing gait speed, coordination, dual tasking during co-treat with PT.  Pt participated in modified hockey activity walking and passing or dribbline the "puck"-a tennis ball with close supervision.  Transitioned to side stepping/galloping during ball toss and then slow job forward and backwards with close supervision.  Therapy/Group: Co-Treatment Martin Edwards 11/27/2014, 3:35 PM

## 2014-11-27 NOTE — Progress Notes (Signed)
Physical Therapy Discharge Summary  Patient Details  Name: Martin Edwards MRN: [id number] Date of Birth: August 20, 1945  Today's Date: 11/27/2014 PT Individual Time: 1505-1600 PT Individual Time Calculation (min): 55 min    Patient has met 8 of 8 long term goals due to improved balance, improved postural control, increased strength, improved attention and improved awareness.  Patient to discharge at an ambulatory level Supervision.   Patient's care partner is independent to provide the necessary cognitive assistance at discharge.  Reasons goals not met: N/A  Recommendation:  Patient will benefit from ongoing skilled PT services in outpatient setting to continue to advance safe functional mobility, address ongoing impairments in cognition and high level balance/coordination, and minimize fall risk.  Equipment: No equipment provided  Reasons for discharge: treatment goals met  Patient/family agrees with progress made and goals achieved: Yes  PT Discharge Precautions/Restrictions Precautions Precaution Comments: helmet when OOB Vital Signs Therapy Vitals Temp: 98.3 F (36.8 C) Temp Source: Oral Pulse Rate: 84 Resp: 18 BP: (!) 110/55 mmHg Patient Position (if appropriate): Sitting Oxygen Therapy SpO2: 99 % O2 Device: Not Delivered Pain Pain Assessment Pain Assessment: No/denies pain Cognition Orientation Level: Oriented X4 Sensation Sensation Light Touch: Appears Intact Proprioception: Appears Intact Coordination Gross Motor Movements are Fluid and Coordinated: Yes Heel Shin Test: WNL as well as toe tapping Motor  Motor Motor: Abnormal postural alignment and control Motor - Discharge Observations: forward head, rounded shoulders  Mobility Bed Mobility Supine to Sit: 7: Independent Sit to Supine: 7: Independent Transfers Transfers: Yes Sit to Stand: 6: Modified independent (Device/Increase time) Stand to Sit: 6: Modified independent (Device/Increase  time) Locomotion  Ambulation Ambulation/Gait Assistance: 6: Modified independent (Device/Increase time) Ambulation Distance (Feet): 300 Feet Ambulation/Gait Assistance Details: increased left arm swing and left pelvic protraction Gait Gait: Yes Gait velocity: 3.3 ft/sec Stairs / Additional Locomotion Stairs: Yes Stairs Assistance: 6: Modified independent (Device/Increase time) Stair Management Technique: Two rails;Alternating pattern;Forwards Number of Stairs: 12 Height of Stairs: 6 Ramp: 6: Modified independent (Device) (inclined pavement) Wheelchair Mobility Wheelchair Mobility: No  Trunk/Postural Assessment  Cervical Assessment Cervical Assessment: Exceptions to [facility] (continues to keep head tilted down and forward)  Balance Berg Balance Test Sit to Stand: Able to stand without using hands and stabilize independently Standing Unsupported: Able to stand safely 2 minutes Sitting with Back Unsupported but Feet Supported on Floor or Stool: Able to sit safely and securely 2 minutes Stand to Sit: Sits safely with minimal use of hands Transfers: Able to transfer safely, minor use of hands Standing Unsupported with Eyes Closed: Able to stand 10 seconds safely Standing Ubsupported with Feet Together: Able to place feet together independently and stand 1 minute safely From Standing, Reach Forward with Outstretched Arm: Can reach forward >12 cm safely (5") From Standing Position, Pick up Object from Floor: Able to pick up shoe safely and easily From Standing Position, Turn to Look Behind Over each Shoulder: Looks behind from both sides and weight shifts well Turn 360 Degrees: Able to turn 360 degrees safely but slowly Standing Unsupported, Alternately Place Feet on Step/Stool: Able to stand independently and safely and complete 8 steps in 20 seconds Standing Unsupported, One Foot in Front: Able to place foot tandem independently and hold 30 seconds Standing on One Leg: Able to lift leg  independently and hold > 10 seconds Total Score: 53 Extremity Assessment      RLE Assessment RLE Assessment: Within Functional Limits LLE Assessment LLE Assessment: Within Functional Limits  See FIM for  current functional status  Shavar Gorka,CYNDI 11/27/2014, 5:06 PM  [place], [place] 11/27/2014

## 2014-11-27 NOTE — Progress Notes (Signed)
Occupational Therapy Session Note  Patient Details  Name: Martin Edwards MRN: [id number] Date of Birth: [birth year]  Today's Date: 11/27/2014 OT Individual Time: 0700-0800 OT Individual Time Calculation (min): 60 min    Short Term Goals: Week 2:  OT Short Term Goal 1 (Week 2): STG=LTG secondary to ELOS  Skilled Therapeutic Interventions/Progress Updates:    Pt engaged in BADL retraining including gathering all clothing and supplies prior to entering bathroom for shower.  Pt completed bathing with sit<>stand from shower seat, standing approx 25% of time.  Pt returned to room for dressing with sit<>stand from EOB.  Pt amb to therapy gym to retrieve soccer ball and "dribbled" ball with feet while walking in hallway.  Pt was able to make appropriate adjustments in speed and direction without assistance.  No LOB noted.  Pt returned to room after retrieving breakfast from cart and carrying tray without assistance.  Focus on activity tolerance, dynamic standing balance, functional amb without AD, and safety awareness.  Therapy Documentation Precautions:  Precautions Precautions: Fall Precaution Comments: L inattention improving; helmet when OOB Restrictions Weight Bearing Restrictions: No Pain: Pain Assessment Pain Assessment: No/denies pain ADL: ADL ADL Comments: see FIM  See FIM for current functional status  Therapy/Group: Individual Therapy  Leroy Libman 11/27/2014, 8:02 AM

## 2014-11-27 NOTE — Progress Notes (Addendum)
Recreational Therapy Session Note  Patient Details  Name: CLERENCE GUBSER MRN: [id number] Date of Birth: 1946-04-24 Today's Date: 11/27/2014  Pain: no c/o Skilled Therapeutic Interventions/Progress Updates: pt participated in community reintegration/outing to [employer] at overall supervision ambulatory level without AD.  Goals focused on safe functional mobility on various community surfaces, locating public restroom, memory, visual scanning, functional problem solving, maintaining precautions (wearing helmet), safe meal consumption, & family education with daughter.  See outing goal sheet for full details.  Pt participated in animal assisted activity/therapy seated with supervision.   Therapy/Group: [employer]   Kaileigh Viswanathan 11/27/2014, 3:42 PM

## 2014-11-27 NOTE — Progress Notes (Signed)
Speech Language Pathology Session Note & Discharge Summary  Patient Details  Name: Martin Edwards MRN: [id number] Date of Birth: 1946-06-11  Today's Date: 11/27/2014 SLP Concurrent Time: 1100-1300 SLP Concurrent Time Calculation (min): 120 min    Skilled Therapeutic Interventions:  Skilled treatment session focused on cognitive goals. Patient participated in community reintegration/outing to [employer] at an overall supervision ambulatory level without AD.  Goals focused on safe functional mobility on various community surfaces, locating public restroom, memory, visual scanning, functional problem solving, maintaining precautions (wearing helmet), safe meal consumption, & family education with daughter. See outing goal sheet for full details.  In regards to SLP goals, patient consumed regular textures with thin liquids with minimal overt s/s of aspiration and was Mod I for use of swallowing compensatory strategies. Patient was also Mod I for problem solving while ordering his meal and for recall of donning his helmet prior to ambulation. Patient did require Min A verbal and question cues for path navigation from [place] parking to room. Family education was also completed with the patient's daughter in regards to his current cognitive function and strategies to improve his overall safety at home in regards to recall, medication management and need for 24 hour supervision. She verbalized understanding of all information and all questions were answered at this time. Patient will discharge home tomorrow with 24 hour supervision from family.    Patient has met 5 of 5 long term goals.  Patient to discharge at overall Supervision level.   Reasons goals not met: N/A   Clinical Impression/Discharge Summary: Patient has made functional gains and has met 5 of 5 LTG's this admission due to increased swallowing function, attention, problem solving, recall and awareness. Currently, patient is demonstrating  behaviors consistent with a [place] Level VIII and requires supervision verbal and question cues for alternating attention, recall of new, functional information, functional problem solving and emergent awareness of errors. Patient and family education is complete and patient will discharge home with 24 hour supervision from family. Patient would benefit from f/u SLP services to maximize his cognitive function and overall functional independence to reduce caregiver burden.   Care Partner:  Caregiver Able to Provide Assistance: Yes  Type of Caregiver Assistance: Cognitive;Physical  Recommendation:  Outpatient SLP;24 hour supervision/assistance  Rationale for SLP Follow Up: Maximize cognitive function and independence;Reduce caregiver burden   Equipment: Helmet    Reasons for discharge: Treatment goals met;Discharged from hospital   Patient/Family Agrees with Progress Made and Goals Achieved: Yes   See FIM for current functional status  Jamani Bearce 11/27/2014, 5:16 PM

## 2014-11-27 NOTE — Progress Notes (Signed)
Subjective/Complaints: No complaints. Denies pain. Excited to be going home tomorrow ROS: Pt denies fever, rash/itching, headache, blurred or double vision, nausea, vomiting, abdominal pain, diarrhea, chest pain, shortness of breath, palpitations, dysuria, dizziness, neck or back pain, bleeding, anxiety, or depression   Objective: Vital Signs: Blood pressure 108/60, pulse 70, temperature 98.5 F (36.9 C), temperature source Oral, resp. rate 18, weight 76.34 kg (168 lb 4.8 oz), SpO2 98 %. No results found. Results for orders placed or performed during the hospital encounter of 11/18/14 (from the past 72 hour(s))  Comprehensive metabolic panel     Status: Abnormal   Collection Time: 11/27/14  5:20 AM  Result Value Ref Range   Sodium 135 135 - 145 mmol/L   Potassium 4.4 3.5 - 5.1 mmol/L   Chloride 100 (L) 101 - 111 mmol/L   CO2 28 22 - 32 mmol/L   Glucose, Bld 109 (H) 65 - 99 mg/dL   BUN 10 6 - 20 mg/dL   Creatinine, Ser 0.90 0.61 - 1.24 mg/dL   Calcium 9.1 8.9 - 10.3 mg/dL   Total Protein 6.4 (L) 6.5 - 8.1 g/dL   Albumin 2.9 (L) 3.5 - 5.0 g/dL   AST 53 (H) 15 - 41 U/L   ALT 154 (H) 17 - 63 U/L   Alkaline Phosphatase 207 (H) 38 - 126 U/L   Total Bilirubin 0.3 0.3 - 1.2 mg/dL   GFR calc non Af Amer >60 >60 mL/min   GFR calc Af Amer >60 >60 mL/min    Comment: (NOTE) The eGFR has been calculated using the CKD EPI equation. This calculation has not been validated in all clinical situations. eGFR's persistently <60 mL/min signify possible Chronic Kidney Disease.    Anion gap 7 5 - 15     HEENT: normal Cardio: RRR and no murmurs Resp: CTA B/L and unlabored GI: BS positive and nnontender nondistended Extremity:  Pulses positive and No Edema Skin:   Scalp and abdominal wounds intact Neuro: Alert/Oriented. Sensory intact. Motor  5/5 in all 4's. [place] coordination. Has fair insight and awareness. Processing delays witnessed when patient attempted to don his helmet. Musc/Skel:   Other no pain with upper extremity or lower extremity range of motion Gen. no acute distress   Assessment/Plan: 1. 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 Physiatrist is providing close team supervision and 24 hour management of active medical problems listed below. Physiatrist and rehab team continue to assess barriers to discharge/monitor patient progress toward functional and medical goals.  FIM: FIM - Bathing Bathing Steps Patient Completed: Chest, Left upper leg, Right Arm, Right lower leg (including foot), Left lower leg (including foot), Left Arm, Abdomen, Front perineal area, Buttocks, Right upper leg Bathing: 6: Assistive device (Comment)  FIM - Upper Body Dressing/Undressing Upper body dressing/undressing steps patient completed: Thread/unthread left sleeve of pullover shirt/dress, Pull shirt over trunk, Thread/unthread right sleeve of pullover shirt/dresss, Put head through opening of pull over shirt/dress Upper body dressing/undressing: 7: Complete Independence: No helper FIM - Lower Body Dressing/Undressing Lower body dressing/undressing steps patient completed: Thread/unthread right underwear leg, Thread/unthread left pants leg, Don/Doff left sock, Thread/unthread left underwear leg, Pull pants up/down, Pull underwear up/down, Fasten/unfasten pants, Thread/unthread right pants leg, Don/Doff right sock, Don/Doff right shoe, Don/Doff left shoe Lower body dressing/undressing: 6: More than reasonable amount of time  FIM - Toileting Toileting steps completed by patient: Adjust clothing prior to toileting, Performs perineal hygiene, Adjust clothing after toileting Toileting Assistive Devices: Grab bar or rail for support Toileting: 6: Assistive device: No helper  FIM -  Radio producer Devices: Grab bars Toilet Transfers: 6-To toilet/ BSC, 6-From toilet/BSC  FIM - IT sales professional Transfer: 6: Bed > Chair or W/C: No assist  FIM - Locomotion: Wheelchair Locomotion: Wheelchair: 0: Activity did not occur FIM - Locomotion: Ambulation Locomotion: Ambulation Assistive Devices: Other (comment) (none) Ambulation/Gait Assistance: 6: Modified independent (Device/Increase time) Locomotion: Ambulation: 6: Travels 150 ft or more independently/takes more than reasonable amount of time  Comprehension Comprehension Mode: Auditory Comprehension: 5-Follows basic conversation/direction: With no assist  Expression Expression Mode: Verbal Expression: 5-Expresses basic needs/ideas: With extra time/assistive device  Social Interaction Social Interaction: 6-Interacts appropriately with others with medication or extra time (anti-anxiety, antidepressant).  Problem Solving Problem Solving: 4-Solves basic 75 - 89% of the time/requires cueing 10 - 24% of the time  Memory Memory: 4-Recognizes or recalls 75 - 89% of the time/requires cueing 10 - 24% of the time  Medical Problem List and Plan: 1. 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 DVT Prophylaxis/Anticoagulation: Mechanical: Sequential compression devices, below knee Bilateral lower extremities 3. Pain Management: minimal pain at this point---continue plan 4. Mood: in good spirits.  5. Neuropsych: This patient is capable of making decisions on his own behalf. 6. Skin/Wound Care: staples out without issue 7. Fluids/Electrolytes/Nutrition: labs reviewed and showing improvement 8. New onset focal motor seizures: Continue dilantin and Keppra 1000 mg bid.   -continue current rx despite lower dilantin levels  -LFT's all improving 9. HTN: Will monitor BP every 8 hours.  Continue Norvasc, HCTZ and lisinopril. Good control at present 10. Hyponatremia: resolved     11. Hypokalemia: On HCTZ. continue kcl supp--K+ normal 12. Staph UTI: abx completed      LOS (Days) 9 A FACE TO FACE EVALUATION WAS PERFORMED  Shaheen Star T 11/27/2014, 8:21 AM

## 2014-11-28 DIAGNOSIS — R569 Unspecified convulsions: Secondary | ICD-10-CM

## 2014-11-28 MED ORDER — LORATADINE 10 MG PO TABS: 10.0000 mg | ORAL_TABLET | Freq: Every day | ORAL | Status: DC

## 2014-11-28 MED ORDER — PANTOPRAZOLE SODIUM 40 MG PO TBEC: 40.0000 mg | DELAYED_RELEASE_TABLET | Freq: Every day | ORAL | Status: DC

## 2014-11-28 MED ORDER — PANTOPRAZOLE SODIUM 40 MG PO TBEC
40.0000 mg | DELAYED_RELEASE_TABLET | Freq: Every day | ORAL | Status: DC
Start: 2014-11-28 — End: 2015-01-17

## 2014-11-28 MED ORDER — SENNOSIDES-DOCUSATE SODIUM 8.6-50 MG PO TABS: 2.0000 | ORAL_TABLET | Freq: Every day | ORAL | Status: DC

## 2014-11-28 MED ORDER — PHENYTOIN 50 MG PO CHEW: 100.0000 mg | CHEWABLE_TABLET | Freq: Three times a day (TID) | ORAL | Status: DC

## 2014-11-28 MED ORDER — LEVETIRACETAM 1000 MG PO TABS: 1000.0000 mg | ORAL_TABLET | Freq: Two times a day (BID) | ORAL | Status: DC

## 2014-11-28 MED ORDER — AMLODIPINE BESYLATE 10 MG PO TABS: 10.0000 mg | ORAL_TABLET | Freq: Every day | ORAL | Status: DC

## 2014-11-28 NOTE — Progress Notes (Signed)
Subjective/Complaints: Had questions about activity levels at home. Overall feeling well  ROS: Pt denies fever, rash/itching, headache, blurred or double vision, nausea, vomiting, abdominal pain, diarrhea, chest pain, shortness of breath, palpitations, dysuria, dizziness, neck or back pain, bleeding, anxiety, or depression   Objective: Vital Signs: Blood pressure 99/56, pulse 74, temperature 98.5 F (36.9 C), temperature source Oral, resp. rate 18, weight 76.34 kg (168 lb 4.8 oz), SpO2 100 %. No results found. Results for orders placed or performed during the hospital encounter of 11/18/14 (from the past 72 hour(s))  Comprehensive metabolic panel     Status: Abnormal   Collection Time: 11/27/14  5:20 AM  Result Value Ref Range   Sodium 135 135 - 145 mmol/L   Potassium 4.4 3.5 - 5.1 mmol/L   Chloride 100 (L) 101 - 111 mmol/L   CO2 28 22 - 32 mmol/L   Glucose, Bld 109 (H) 65 - 99 mg/dL   BUN 10 6 - 20 mg/dL   Creatinine, Ser 0.90 0.61 - 1.24 mg/dL   Calcium 9.1 8.9 - 10.3 mg/dL   Total Protein 6.4 (L) 6.5 - 8.1 g/dL   Albumin 2.9 (L) 3.5 - 5.0 g/dL   AST 53 (H) 15 - 41 U/L   ALT 154 (H) 17 - 63 U/L   Alkaline Phosphatase 207 (H) 38 - 126 U/L   Total Bilirubin 0.3 0.3 - 1.2 mg/dL   GFR calc non Af Amer >60 >60 mL/min   GFR calc Af Amer >60 >60 mL/min    Comment: (NOTE) The eGFR has been calculated using the CKD EPI equation. This calculation has not been validated in all clinical situations. eGFR's persistently <60 mL/min signify possible Chronic Kidney Disease.    Anion gap 7 5 - 15     HEENT: normal Cardio: RRR and no murmurs Resp: CTA B/L and unlabored GI: BS positive and nontender/ nondistended Extremity:  Pulses positive and No Edema Skin:   Scalp and abdominal wounds intact Neuro: Alert/Oriented. Sensory intact. Motor  5/5 in all 4's. [place] coordination. Has fair insight and awareness.   Musc/Skel:  Other no pain with upper extremity or lower extremity range of  motion Gen. No  Distress Psych: pleasant mood   Assessment/Plan: 1. 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 Physiatrist is providing close team supervision and 24 hour management of active medical problems listed below. Physiatrist and rehab team continue to assess barriers to discharge/monitor patient progress toward functional and medical goals.  Home today  FIM: FIM - Bathing Bathing Steps Patient Completed: Chest, Left upper leg, Right Arm, Right lower leg (including foot), Left lower leg (including foot), Left Arm, Abdomen, Front perineal area, Buttocks, Right upper leg Bathing: 6: Assistive device (Comment)  FIM - Upper Body Dressing/Undressing Upper body dressing/undressing steps patient completed: Thread/unthread left sleeve of pullover shirt/dress, Pull shirt over trunk, Thread/unthread right sleeve of pullover shirt/dresss, Put head through opening of pull over shirt/dress Upper body dressing/undressing: 7: Complete Independence: No helper FIM - Lower Body Dressing/Undressing Lower body dressing/undressing steps patient completed: Thread/unthread right underwear leg, Thread/unthread left pants leg, Don/Doff left sock, Thread/unthread left underwear leg, Pull pants up/down, Pull underwear up/down, Fasten/unfasten pants, Thread/unthread right pants leg, Don/Doff right sock, Don/Doff right shoe, Don/Doff left shoe Lower body dressing/undressing: 6: More than reasonable amount of time  FIM -  Toileting Toileting steps completed by patient: Adjust clothing prior to toileting, Performs perineal hygiene, Adjust clothing after toileting Toileting Assistive Devices: Grab bar or rail for support Toileting: 6: Assistive device: No helper  FIM - Engineer, structural Devices: Product manager Transfers: 6-To toilet/ BSC, 6-From toilet/BSC  FIM - Control and instrumentation engineer Devices: Arm rests Bed/Chair Transfer: 6: Assistive device: no helper, 7: Sit > Supine: No assist, 7: Supine > Sit: No assist, 6: Bed > Chair or W/C: No assist, 6: Chair or W/C > Bed: No assist  FIM - Locomotion: Wheelchair Locomotion: Wheelchair: 0: Activity did not occur FIM - Locomotion: Ambulation Locomotion: Ambulation Assistive Devices: Other (comment) (none) Ambulation/Gait Assistance: 6: Modified independent (Device/Increase time) Locomotion: Ambulation: 6: Travels 150 ft or more independently/takes more than reasonable amount of time  Comprehension Comprehension Mode: Auditory Comprehension: 5-Understands basic 90% of the time/requires cueing < 10% of the time  Expression Expression Mode: Verbal Expression: 5-Expresses basic 90% of the time/requires cueing < 10% of the time.  Social Interaction Social Interaction: 6-Interacts appropriately with others with medication or extra time (anti-anxiety, antidepressant).  Problem Solving Problem Solving: 5-Solves complex 90% of the time/cues < 10% of the time  Memory Memory: 5-Recognizes or recalls 90% of the time/requires cueing < 10% of the time  Medical Problem List and Plan: 1. 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 DVT Prophylaxis/Anticoagulation: Mechanical: Sequential compression devices, below knee Bilateral lower extremities 3. Pain Management: minimal pain at this point---continue plan 4. Mood: in good spirits.  5. Neuropsych: This patient is capable of making decisions on his own behalf. 6. Skin/Wound Care: staples out without issue 7. Fluids/Electrolytes/Nutrition: labs reviewed  8. New onset focal motor seizures: Continue dilantin and Keppra 1000 mg bid.   -continue  current rx despite lower dilantin levels  -LFT's improving 9. HTN: Will monitor BP every 8 hours. Continue Norvasc, HCTZ and lisinopril. Good control at present 10. Hyponatremia: resolved     11. Hypokalemia: On HCTZ. continue kcl supp--K+ normal 12. Staph UTI: abx completed      LOS (Days) 10 A FACE TO FACE EVALUATION WAS PERFORMED  Carmichael Burdette T 11/28/2014, 8:19 AM

## 2014-11-28 NOTE — Progress Notes (Signed)
Recreational Therapy Discharge Summary Patient Details  Name: Martin Edwards MRN: [id number] Date of Birth: [birth year] Today's Date: 11/28/2014  Long term goals set: 2  Long term goals met: 2  Comments on progress toward goals: Pt had made excellent progress during LOS and is ready for discharge to daughter's home with 24 hour supervision.  Pt participated in community reintegration/outing and mod-complex TR task at supervision level, cuing for safety.  Education with daughter on potential leisure activities for participation at discharge along with safety concerns associated with each.  Both pt & daughter stated understanding.  Reasons for discharge: discharge from hospital  Patient/family agrees with progress made and goals achieved: Yes  Martin Edwards 11/28/2014, 8:21 AM

## 2014-11-28 NOTE — Progress Notes (Signed)
Social Work  Discharge Note  The overall goal for the admission was met for:   Discharge location: Yes - home with daughter  Length of Stay: Yes - 10 days  Discharge activity level: Yes - supervision  Home/community participation: Yes  Services provided included: MD, RD, PT, OT, SLP, RN, TR, Pharmacy, Neuropsych and SW  Financial Services: Medicare  Follow-up services arranged: Outpatient: PT and ST via [facility] and Patient/Family has no preference for HH/DME agencies  Comments (or additional information):  Patient/Family verbalized understanding of follow-up arrangements: Yes  Individual responsible for coordination of the follow-up plan: pt/daughter  Confirmed correct DME delivered: NA    Nikiesha Milford

## 2014-11-28 NOTE — Progress Notes (Signed)
1236 11/28/14 nsg Patient discharged to home per wheelchair accompanied by NT and daughter. Discharge instructions given by PA. No other questions noted.

## 2014-11-28 NOTE — Progress Notes (Signed)
Occupational Therapy Discharge Summary  Patient Details  Name: Martin Edwards MRN: [id number] Date of Birth: 1946-03-12   Patient has met 55 of 13 long term goals due to improved activity tolerance, improved balance, postural control, ability to compensate for deficits, improved attention, improved awareness and improved coordination.  Pt made steady progress with BADLs during this admission.  Pt completes all BADLs without physical assistance but continues to require min verbal cues to don helmet when ambulating.  Pt's daughter has been present for family education and has verbalized understanding of recommendation for 24 hour supervision.  Pt exhibits behaviors consistent with [place]. Patient to discharge at overall Modified Independent level for self-care tasks with 24/7 supervision due to cognitive deficits.  Patient's care partner is independent to provide the necessary physical and cognitive assistance at discharge.      Recommendation:  No skilled occupational therapy recommended at this time.  Equipment: Tub seat recommended.  Pt's daughter to purchase after discharge   Reasons for discharge: treatment goals met and discharge from hospital  Patient/family agrees with progress made and goals achieved: Yes  OT Discharge     ADL ADL ADL Comments: see FIM Vision/Perception  Vision- History Baseline Vision/History: Wears glasses Wears Glasses: At all times Patient Visual Report: No change from baseline Vision- Assessment Vision Assessment?: Yes Ocular Range of Motion: Restricted on the left Alignment/Gaze Preference: Within Defined Limits Saccades: Decreased speed of saccadic movement;Additional head turns occurred during testing Convergence: Within functional limits Visual Fields: No apparent deficits  Cognition Overall Cognitive Status: Impaired/Different from baseline Arousal/Alertness: Awake/alert Orientation Level: Oriented X4 Attention:  Selective;Alternating;Divided Selective Attention: Appears intact Selective Attention Impairment: Functional basic Alternating Attention: Appears intact Divided Attention: Impaired Divided Attention Impairment: Functional complex Memory: Impaired Memory Impairment: Decreased recall of new information Awareness: Impaired Awareness Impairment: Emergent impairment Problem Solving: Impaired Problem Solving Impairment: Functional complex Safety/Judgment: Impaired [place] [employer] Scales of Cognitive Functioning: Purposeful/appropriate Sensation Sensation Light Touch: Appears Intact Stereognosis: Appears Intact Hot/Cold: Appears Intact Proprioception: Appears Intact Coordination Gross Motor Movements are Fluid and Coordinated: Yes Fine Motor Movements are Fluid and Coordinated: Yes Motor  Motor Motor: Abnormal postural alignment and control Motor - Skilled Clinical Observations: generalized weakness, foward head, rounded shoulders, flexed hips, flattened lordosis, head tilt to right Motor - Discharge Observations: forward head, rounded shoulders     Trunk/Postural Assessment  Cervical Assessment Cervical Assessment: Exceptions to [facility] (forward head) Thoracic Assessment Thoracic Assessment: Within Functional Limits Lumbar Assessment Lumbar Assessment: Exceptions to WFL (flattened lordosis) Postural Control Righting Reactions: delayed    Extremity/Trunk Assessment RUE Assessment RUE Assessment: Within Functional Limits LUE Assessment LUE Assessment: Within Functional Limits  See FIM for current functional status  Leroy Libman 11/28/2014, 6:40 AM

## 2014-12-03 ENCOUNTER — Ambulatory Visit: Payer: Medicare Other | Admitting: Emergency Medicine

## 2014-12-04 DIAGNOSIS — R262 Difficulty in walking, not elsewhere classified: Secondary | ICD-10-CM | POA: Diagnosis not present

## 2014-12-04 DIAGNOSIS — R41841 Cognitive communication deficit: Secondary | ICD-10-CM | POA: Diagnosis not present

## 2014-12-04 DIAGNOSIS — S069X1S Unspecified intracranial injury with loss of consciousness of 30 minutes or less, sequela: Secondary | ICD-10-CM | POA: Diagnosis not present

## 2014-12-04 DIAGNOSIS — R293 Abnormal posture: Secondary | ICD-10-CM | POA: Diagnosis not present

## 2014-12-05 DIAGNOSIS — R293 Abnormal posture: Secondary | ICD-10-CM | POA: Diagnosis not present

## 2014-12-05 DIAGNOSIS — S069X1S Unspecified intracranial injury with loss of consciousness of 30 minutes or less, sequela: Secondary | ICD-10-CM | POA: Diagnosis not present

## 2014-12-05 DIAGNOSIS — R41841 Cognitive communication deficit: Secondary | ICD-10-CM | POA: Diagnosis not present

## 2014-12-05 DIAGNOSIS — R262 Difficulty in walking, not elsewhere classified: Secondary | ICD-10-CM | POA: Diagnosis not present

## 2014-12-10 DIAGNOSIS — R41841 Cognitive communication deficit: Secondary | ICD-10-CM | POA: Diagnosis not present

## 2014-12-10 DIAGNOSIS — R293 Abnormal posture: Secondary | ICD-10-CM | POA: Diagnosis not present

## 2014-12-10 DIAGNOSIS — R262 Difficulty in walking, not elsewhere classified: Secondary | ICD-10-CM | POA: Diagnosis not present

## 2014-12-10 DIAGNOSIS — S069X1S Unspecified intracranial injury with loss of consciousness of 30 minutes or less, sequela: Secondary | ICD-10-CM | POA: Diagnosis not present

## 2014-12-11 DIAGNOSIS — S069X1S Unspecified intracranial injury with loss of consciousness of 30 minutes or less, sequela: Secondary | ICD-10-CM | POA: Diagnosis not present

## 2014-12-11 DIAGNOSIS — R293 Abnormal posture: Secondary | ICD-10-CM | POA: Diagnosis not present

## 2014-12-11 DIAGNOSIS — R41841 Cognitive communication deficit: Secondary | ICD-10-CM | POA: Diagnosis not present

## 2014-12-11 DIAGNOSIS — R262 Difficulty in walking, not elsewhere classified: Secondary | ICD-10-CM | POA: Diagnosis not present

## 2014-12-17 ENCOUNTER — Other Ambulatory Visit: Payer: Self-pay | Admitting: Neurosurgery

## 2014-12-17 ENCOUNTER — Ambulatory Visit (INDEPENDENT_AMBULATORY_CARE_PROVIDER_SITE_OTHER): Payer: Medicare Other | Admitting: Emergency Medicine

## 2014-12-17 ENCOUNTER — Encounter: Payer: Self-pay | Admitting: Emergency Medicine

## 2014-12-17 ENCOUNTER — Ambulatory Visit
Admission: RE | Admit: 2014-12-17 | Discharge: 2014-12-17 | Disposition: A | Discharge status: Home / Self Care | Payer: Medicare Other | Source: Ambulatory Visit | Attending: Neurosurgery | Admitting: Neurosurgery

## 2014-12-17 VITALS — BP 160/76 | HR 88 | Temp 98.3°F | Resp 16 | Ht 69.0 in | Wt 164.0 lb

## 2014-12-17 DIAGNOSIS — R413 Other amnesia: Secondary | ICD-10-CM | POA: Diagnosis not present

## 2014-12-17 DIAGNOSIS — S065X9A Traumatic subdural hemorrhage with loss of consciousness of unspecified duration, initial encounter: Secondary | ICD-10-CM

## 2014-12-17 DIAGNOSIS — Z0000 Encounter for general adult medical examination without abnormal findings: Secondary | ICD-10-CM | POA: Diagnosis not present

## 2014-12-17 DIAGNOSIS — S065XAA Traumatic subdural hemorrhage with loss of consciousness status unknown, initial encounter: Secondary | ICD-10-CM

## 2014-12-17 DIAGNOSIS — E785 Hyperlipidemia, unspecified: Secondary | ICD-10-CM | POA: Diagnosis not present

## 2014-12-17 DIAGNOSIS — R569 Unspecified convulsions: Secondary | ICD-10-CM | POA: Diagnosis not present

## 2014-12-17 DIAGNOSIS — I6200 Nontraumatic subdural hemorrhage, unspecified: Secondary | ICD-10-CM

## 2014-12-17 DIAGNOSIS — I10 Essential (primary) hypertension: Secondary | ICD-10-CM

## 2014-12-17 LAB — CBC WITH DIFFERENTIAL/PLATELET
Basophils Absolute: 0 10*3/uL (ref 0.0–0.1)
Basophils Relative: 0 % (ref 0–1)
Eosinophils Absolute: 0 10*3/uL (ref 0.0–0.7)
Eosinophils Relative: 0 % (ref 0–5)
HCT: 40.7 % (ref 39.0–52.0)
HEMOGLOBIN: 13.7 g/dL (ref 13.0–17.0)
LYMPHS ABS: 1.1 10*3/uL (ref 0.7–4.0)
LYMPHS PCT: 20 % (ref 12–46)
MCH: 30 pg (ref 26.0–34.0)
MCHC: 33.7 g/dL (ref 30.0–36.0)
MCV: 89.3 fL (ref 78.0–100.0)
MONOS PCT: 7 % (ref 3–12)
MPV: 11.4 fL (ref 8.6–12.4)
Monocytes Absolute: 0.4 10*3/uL (ref 0.1–1.0)
Neutro Abs: 3.9 10*3/uL (ref 1.7–7.7)
Neutrophils Relative %: 73 % (ref 43–77)
PLATELETS: 228 10*3/uL (ref 150–400)
RBC: 4.56 MIL/uL (ref 4.22–5.81)
RDW: 14.2 % (ref 11.5–15.5)
WBC: 5.3 10*3/uL (ref 4.0–10.5)

## 2014-12-17 LAB — POCT URINALYSIS DIPSTICK
Bilirubin, UA: NEGATIVE
Glucose, UA: NEGATIVE
KETONES UA: NEGATIVE
LEUKOCYTES UA: NEGATIVE
Nitrite, UA: NEGATIVE
PH UA: 5.5
PROTEIN UA: NEGATIVE
RBC UA: NEGATIVE
Spec Grav, UA: <=1.005
Urobilinogen, UA: 0.2

## 2014-12-17 LAB — LIPID PANEL
Cholesterol: 159 mg/dL (ref 0–200)
HDL: 53 mg/dL (ref >=40)
LDL Cholesterol: 86 mg/dL (ref 0–99)
Total CHOL/HDL Ratio: 3 Ratio
Triglycerides: 101 mg/dL (ref <150)
VLDL: 20 mg/dL (ref 0–40)

## 2014-12-17 LAB — BASIC METABOLIC PANEL WITH GFR
BUN: 9 mg/dL (ref 6–23)
CO2: 24 mEq/L (ref 19–32)
CREATININE: 0.74 mg/dL (ref 0.50–1.35)
Calcium: 9.2 mg/dL (ref 8.4–10.5)
Chloride: 102 mEq/L (ref 96–112)
GFR, Est African American: >89 mL/min
Glucose, Bld: 116 mg/dL — ABNORMAL HIGH (ref 70–99)
Potassium: 4.3 mEq/L (ref 3.5–5.3)
Sodium: 137 mEq/L (ref 135–145)

## 2014-12-17 NOTE — Progress Notes (Signed)
   Subjective:  This chart was scribed for Nena Jordan, MD by [facility], medical scribe at [facility].The patient was seen in exam room 21 and the patient's care was started at 9:39 AM.   Patient ID: Martin Edwards, male    DOB: 1946-05-09, 69 y.o.   MRN: [id number] Chief Complaint  Patient presents with  . Annual Exam   HPI HPI Comments: Martin Edwards is a 69 y.o. male who presents to [facility] for an annual exam.  Follow up for Subdural hematoma: Fall on 11/11/2014, since his fall he has had two surgeries, one left which has not been scheduled yet. He has also undergone rehab, physical therapy and speech therapy which has been helpful. He is wearing a protective helmet, and staying with his children. While in the hospital he began having seizures and is now on medication. He will need a referral to a neurologist. Last CT scan of his head was 11/15/2014. He has another CT scheduled. He was told he is weaker on the left side but strength has been improving.  Notes feeling "funny" on Friday, he has hand was numb and weak. He was concerned about this, first time this happened since coming home from the hospital. This episode lasted 30 min to one hour. Since his surgery his children have noticed short term memory loss and cognitive loss.   He noticed more frequent burping after taking his medications and eating.  Hypertension: His blood pressure is up today, he says blood pressure has been fine before. Recheck was 160/76. Blood pressure outside the office has been around 130/60s.  BP Readings from Last 3 Encounters:  12/17/14 191/83  11/28/14 128/63  11/18/14 123/65   Review of Systems  Neurological: Positive for numbness.      Objective:  BP 191/83 mmHg  Pulse 88  Temp(Src) 98.3 F (36.8 C)  Resp 16  Ht 5\' 9"  (1.753 m)  Wt 164 lb (74.39 kg)  BMI 24.21 kg/m2 Physical Exam  Vitals reviewed. CONSTITUTIONAL: Well developed/well  nourished HEAD: Normocephalic/atraumatic, large scar on the right side of his head. EYES: EOMI/PERRL ENMT: Mucous membranes moist NECK: supple no meningeal signs SPINE/BACK:entire spine nontender CV: S1/S2 noted, no murmurs/rubs/gallops noted LUNGS: Lungs are clear to auscultation bilaterally, no apparent distress ABDOMEN: soft, nontender, no rebound or guarding, bowel sounds noted throughout abdomen. There is a scar in the RUQ with a bony prominence.  GU:no cva tenderness NEURO: Pt is awake/alert/appropriate, moves all extremitiesx4.  No facial droop.   EXTREMITIES: pulses normal/equal, full ROM SKIN: warm, color normal PSYCH: no abnormalities of mood noted, alert and oriented to situation    Assessment & Plan:  Patient is status post recent subdural hematoma. He had evacuation of the hematoma and then a second surgery and a piece of bone was taken and placed in his abdominal cavity. He has had one episode where he had weakness in his left arm with some numbness on Friday which lasted about one hour. I called Dr. Harl Bowie office and he was very helpful and will arrange scanning today along with his already scheduled follow-up appointment on Thursday. I made a referral to neurology to follow his seizure medications. His blood pressure over time improved. His blood pressures during his hospitalization were normal.

## 2014-12-17 NOTE — Progress Notes (Deleted)
   Subjective:    Patient ID: Martin Edwards, male    DOB: [birth year], 69 y.o.   MRN: [id number]  HPI    Review of Systems     Objective:   Physical Exam        Assessment & Plan:

## 2014-12-18 DIAGNOSIS — R293 Abnormal posture: Secondary | ICD-10-CM | POA: Diagnosis not present

## 2014-12-18 DIAGNOSIS — R262 Difficulty in walking, not elsewhere classified: Secondary | ICD-10-CM | POA: Diagnosis not present

## 2014-12-18 DIAGNOSIS — R41841 Cognitive communication deficit: Secondary | ICD-10-CM | POA: Diagnosis not present

## 2014-12-18 DIAGNOSIS — S069X1S Unspecified intracranial injury with loss of consciousness of 30 minutes or less, sequela: Secondary | ICD-10-CM | POA: Diagnosis not present

## 2014-12-20 ENCOUNTER — Telehealth: Payer: Self-pay

## 2014-12-20 LAB — LEVETIRACETAM LEVEL: KEPPRA (LEVETIRACETAM): 23.3 ug/mL

## 2014-12-20 NOTE — Telephone Encounter (Signed)
Pt's daughter called to ask if pt needs to start taking the "hctz", a dieuretic, again? During the pt's recent hospital visit he was not taking this medication neither has he been taking it since then. Pt's daughter is concerned.

## 2014-12-20 NOTE — Telephone Encounter (Signed)
Dr. Daub, please advise.   

## 2014-12-20 NOTE — Telephone Encounter (Signed)
Pt's daughter called again.  [phone]- she says its okay to leave a VM if there is no answer. If Dr. Everlene Farrier does decide to send in the Rx, please send it in to the [facility] on [address].

## 2014-12-21 NOTE — Telephone Encounter (Signed)
Yes go ahead and please call in a prescription for HCTZ 12.5 one a day. Call and let the daughter know.

## 2014-12-24 MED ORDER — HYDROCHLOROTHIAZIDE 12.5 MG PO CAPS: 12.5000 mg | ORAL_CAPSULE | Freq: Every day | ORAL | Status: DC

## 2014-12-24 NOTE — Telephone Encounter (Signed)
Rx sent in. Daughter notified.

## 2014-12-30 ENCOUNTER — Other Ambulatory Visit: Payer: Self-pay | Admitting: Emergency Medicine

## 2015-01-01 DIAGNOSIS — S069X1S Unspecified intracranial injury with loss of consciousness of 30 minutes or less, sequela: Secondary | ICD-10-CM | POA: Diagnosis not present

## 2015-01-01 DIAGNOSIS — R41841 Cognitive communication deficit: Secondary | ICD-10-CM | POA: Diagnosis not present

## 2015-01-01 DIAGNOSIS — R293 Abnormal posture: Secondary | ICD-10-CM | POA: Diagnosis not present

## 2015-01-01 DIAGNOSIS — R262 Difficulty in walking, not elsewhere classified: Secondary | ICD-10-CM | POA: Diagnosis not present

## 2015-01-01 NOTE — Telephone Encounter (Signed)
Dr Everlene Farrier, it looks like pt was started on Claritin in hosp. Do you want to Rx it for him, doesn't look like you have in the past?

## 2015-01-02 DIAGNOSIS — R41841 Cognitive communication deficit: Secondary | ICD-10-CM | POA: Diagnosis not present

## 2015-01-02 DIAGNOSIS — R262 Difficulty in walking, not elsewhere classified: Secondary | ICD-10-CM | POA: Diagnosis not present

## 2015-01-02 DIAGNOSIS — S069X1S Unspecified intracranial injury with loss of consciousness of 30 minutes or less, sequela: Secondary | ICD-10-CM | POA: Diagnosis not present

## 2015-01-02 DIAGNOSIS — R293 Abnormal posture: Secondary | ICD-10-CM | POA: Diagnosis not present

## 2015-01-06 DIAGNOSIS — R262 Difficulty in walking, not elsewhere classified: Secondary | ICD-10-CM | POA: Diagnosis not present

## 2015-01-06 DIAGNOSIS — S069X1D Unspecified intracranial injury with loss of consciousness of 30 minutes or less, subsequent encounter: Secondary | ICD-10-CM | POA: Diagnosis not present

## 2015-01-06 DIAGNOSIS — R293 Abnormal posture: Secondary | ICD-10-CM | POA: Diagnosis not present

## 2015-01-06 DIAGNOSIS — R41841 Cognitive communication deficit: Secondary | ICD-10-CM | POA: Diagnosis not present

## 2015-01-08 ENCOUNTER — Encounter: Payer: Self-pay | Admitting: Neurology

## 2015-01-08 ENCOUNTER — Ambulatory Visit (INDEPENDENT_AMBULATORY_CARE_PROVIDER_SITE_OTHER): Payer: Medicare Other | Admitting: Neurology

## 2015-01-08 VITALS — BP 157/89 | HR 97 | Ht 69.0 in | Wt 165.0 lb

## 2015-01-08 DIAGNOSIS — Z5181 Encounter for therapeutic drug level monitoring: Secondary | ICD-10-CM

## 2015-01-08 DIAGNOSIS — R569 Unspecified convulsions: Secondary | ICD-10-CM

## 2015-01-08 MED ORDER — PHENYTOIN SODIUM EXTENDED 100 MG PO CAPS: 300.0000 mg | ORAL_CAPSULE | Freq: Every day | ORAL | Status: DC

## 2015-01-08 MED ORDER — LEVETIRACETAM 1000 MG PO TABS: 1000.0000 mg | ORAL_TABLET | Freq: Two times a day (BID) | ORAL | Status: DC

## 2015-01-08 NOTE — Patient Instructions (Addendum)
   We will check a Dilantin level today, he will continue the Keppra and the Dilantin for now. We will check back in about 4 months, no driving until further notice. Please let us know if any further seizures occur.  Seizure, Adult A seizure is abnormal electrical activity in the brain. Seizures usually last from 30 seconds to 2 minutes. There are various types of seizures. Before a seizure, you may have a warning sensation (aura) that a seizure is about to occur. An aura may include the following symptoms:   Fear or anxiety.  Nausea.  Feeling like the room is spinning (vertigo).  Vision changes, such as seeing flashing lights or spots. Common symptoms during a seizure include:  A change in attention or behavior (altered mental status).  Convulsions with rhythmic jerking movements.  Drooling.  Rapid eye movements.  Grunting.  Loss of bladder and bowel control.  Bitter taste in the mouth.  Tongue biting. After a seizure, you may feel confused and sleepy. You may also have an injury resulting from convulsions during the seizure. HOME CARE INSTRUCTIONS   If you are given medicines, take them exactly as prescribed by your health care provider.  Keep all follow-up appointments as directed by your health care provider.  Do not swim or drive or engage in risky activity during which a seizure could cause further injury to you or others until your health care provider says it is OK.  Get adequate rest.  Teach friends and family what to do if you have a seizure. They should:  Lay you on the ground to prevent a fall.  Put a cushion under your head.  Loosen any tight clothing around your neck.  Turn you on your side. If vomiting occurs, this helps keep your airway clear.  Stay with you until you recover.  Know whether or not you need emergency care. SEEK IMMEDIATE MEDICAL CARE IF:  The seizure lasts longer than 5 minutes.  The seizure is severe or you do not wake up  immediately after the seizure.  You have an altered mental status after the seizure.  You are having more frequent or worsening seizures. Someone should drive you to the emergency department or call local emergency services (911 in [place] MAKE SURE YOU:  Understand these instructions.  Will watch your condition.  Will get help right away if you are not doing well or get worse. Document Released: 05/28/2000 Document Revised: 03/21/2013 Document Reviewed: 01/10/2013 [facility] Patient Information 2015 [place], [place]. This information is not intended to replace advice given to you by your health care provider. Make sure you discuss any questions you have with your health care provider.

## 2015-01-08 NOTE — Progress Notes (Signed)
Reason for visit: Seizures  Referring physician: Dr. Sanjuana Letters Martin Edwards is a 69 y.o. male  History of present illness:  Martin Edwards is a 69 year old right-handed white male with a history of a fall that occurred on 11/11/2014. The patient went to the emergency room that evening as he was having severe headaches. He was found to have a large right subdural hematoma that required a craniotomy. 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 The patient was treated with Dilantin and Keppra, and the seizures came under control. The patient was seen by Dr. Joya Salm, and he is getting serial CT scan evaluations of the brain. The patient has a cranial flap in his abdomen, this has not yet been replaced. The patient is not operating a motor vehicle. He is living with his daughter currently in the [place], [place] area. The patient normally lives alone in the [place], [place] area. The patient is on Dilantin and Keppra currently, he is tolerating medications well. The last Dilantin level was in June 2016. He denies any headaches, vision changes, or residual numbness or weakness of the face, arms, and legs. He does have some mild short-term memory issues and concentration changes since the fall. He comes to this office for an evaluation.  Past Medical History  Diagnosis Date  . Hypertension   . Hyperlipidemia   . Kidney stones     "passed them"  . Prostate cancer 1999  . Basal cell carcinoma of face X 2  . Small bowel obstruction due to adhesions 01/30/2014    Past Surgical History  Procedure Laterality Date  . Prostatectomy  05/20/1998  . Colectomy  10/2010    "took out 8 inches of colon; wasn't cancer"  . Basal cell carcinoma excision  X 2    "off my face both times"  . Craniotomy Right 11/11/2014    Procedure: CRANIOTOMY HEMATOMA EVACUATION  SUBDURAL;  Surgeon: Leeroy Cha, MD;  Location: [facility];  Service: Neurosurgery;  Laterality: Right;  . Craniotomy Right 11/13/2014    Procedure: Removal bone flap, insertion into the abdominal wall;  Surgeon: Leeroy Cha, MD;  Location: [place] [facility];  Service: Neurosurgery;  Laterality: Right;    Family History  Problem Relation Age of Onset  . Cancer Father     Lung    Social history:  reports that he has never smoked. He has never used smokeless tobacco. He reports that he does not drink alcohol or use illicit drugs.  Medications:  Prior to Admission medications   Medication Sig Start Date End Date Taking? Authorizing Provider  amLODipine (NORVASC) 10 MG tablet take 1 tablet by mouth once daily 01/01/15  Yes Darlyne Russian, MD  atorvastatin (LIPITOR) 40 MG tablet Take 1 tablet (40 mg total) by mouth daily. 06/19/14  Yes Darlyne Russian, MD  hydrochlorothiazide (MICROZIDE) 12.5 MG capsule Take 1 capsule (12.5 mg total) by mouth daily. 12/24/14  Yes Darlyne Russian, MD  levETIRAcetam (KEPPRA) 1000 MG tablet Take 1 tablet (1,000 mg total) by mouth 2 (two) times daily. 11/28/14  Yes Ivan Anchors Love, PA-C  lisinopril (PRINIVIL,ZESTRIL) 40 MG tablet Take 1 tablet (40 mg total) by mouth daily. 06/05/14  Yes Darlyne Russian, MD  pantoprazole (PROTONIX) 40 MG tablet Take 1 tablet (40 mg total) by mouth at bedtime. 11/28/14  Yes Bary Leriche, PA-C  phenytoin (DILANTIN) 50 MG tablet Chew 2 tablets (100 mg total) by mouth 3 (three) times daily. 11/28/14  Yes Ivan Anchors Love, PA-C  senna-docusate (SENOKOT-S) 8.6-50 MG per tablet Take 2 tablets by mouth at bedtime. 11/28/14  Yes Ivan Anchors Love, PA-C     No Known Allergies  ROS:  Out of a complete 14 system review of symptoms, the patient complains only of the following symptoms, and all other reviewed systems are negative.  Seizures Stress incontinence of the bladder  Blood pressure 157/89, pulse 97, height 5\' 9"  (1.753 m), weight 165 lb (74.844  kg).  Physical Exam  General: The patient is alert and cooperative at the time of the examination. The patient is wearing a protective helmet.  Eyes: Pupils are equal, round, and reactive to light. Discs are flat bilaterally.  Neck: The neck is supple, no carotid bruits are noted.  Respiratory: The respiratory examination is clear.  Cardiovascular: The cardiovascular examination reveals a regular rate and rhythm, no obvious murmurs or rubs are noted.  Skin: Extremities are without significant edema.  Neurologic Exam  Mental status: The patient is alert and oriented x 3 at the time of the examination. The patient has apparent normal recent and remote memory, with an apparently normal attention span and concentration ability.  Cranial nerves: Facial symmetry is present. There is good sensation of the face to pinprick and soft touch bilaterally. The strength of the facial muscles and the muscles to head turning and shoulder shrug are normal bilaterally. Speech is well enunciated, no aphasia or dysarthria is noted. Extraocular movements are full. Visual fields are full. The tongue is midline, and the patient has symmetric elevation of the soft palate. No obvious hearing deficits are noted.  Motor: The motor testing reveals 5 over 5 strength of all 4 extremities. Good symmetric motor tone is noted throughout.  Sensory: Sensory testing is intact to pinprick, soft touch, vibration sensation, and position sense on all 4 extremities. No evidence of extinction is noted.  Coordination: Cerebellar testing reveals good finger-nose-finger and heel-to-shin bilaterally.  Gait and station: Gait is normal. Tandem gait is normal. Romberg is negative. No drift is seen.  Reflexes: Deep tendon reflexes are symmetric and normal bilaterally. Toes are downgoing bilaterally.  EEG 11/17/14:  Interpretation: This EEG recording was abnormal generalized continuous slowing of cerebral activity which was more  pronounced on the side. PLEDs are nonspecific and can be seen with a wide variety of hemispheric encephalopathies, although most commonly seen with strokes. No evidence of ongoing epileptic activity was recorded.   CT head 12/17/14:  IMPRESSION: Interval decrease in size of previously described right-sided subdural hematoma, which is predominantly mixed in attenuation. Bandlike high attenuation material underlying the craniectomy site may represent postprocedural changes or shifting/evolving blood products.  Interval decrease in right to left midline shift measuring 1.5 mm, previously 9 mm.  Low attenuation within the right cerebral white matter may represent edema or sequelae of infarct.  * CT scan images were reviewed online. I agree with the written report.   Assessment/Plan:  1. Right subdural hematoma  2. 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 He has no obvious focal deficits from the prior injury. He has not had any further seizures since coming out of the hospital. He is not to operate a motor vehicle for least 6 months following the last seizure. He will remain on Dilantin and Keppra for now, we will check a Dilantin level today. Prescriptions  were called in for the [place] and Dilantin. The patient will follow-up in 4 months. We will plan on keeping him on at least one anticonvulsive medication for one year following the initial injury. He will contact our office if any seizure recurrence is noted. I have indicated that I have no problem with him going back to living independently, but he is still not able to operate a motor vehicle. His son lives within 5-10 minutes of his house.   [name] 01/09/2015 7:50 AM  [facility] [address] [place], [place] [address]  Phone [phone] Fax [phone]

## 2015-01-09 ENCOUNTER — Telehealth: Payer: Self-pay | Admitting: Neurology

## 2015-01-09 LAB — PHENYTOIN LEVEL, TOTAL: Phenytoin (Dilantin), Serum: 7.7 ug/mL — ABNORMAL LOW (ref 10.0–20.0)

## 2015-01-09 NOTE — Telephone Encounter (Signed)
I called the patient, talk with the daughter. The Dilantin level 7.7. The patient is well controlled on this medication along with Keppra. I will not alter dosing. They will call me if they believe that he has had another seizure event.

## 2015-01-17 ENCOUNTER — Telehealth: Payer: Self-pay

## 2015-01-17 DIAGNOSIS — I10 Essential (primary) hypertension: Secondary | ICD-10-CM

## 2015-01-17 DIAGNOSIS — K5900 Constipation, unspecified: Secondary | ICD-10-CM

## 2015-01-17 DIAGNOSIS — E785 Hyperlipidemia, unspecified: Secondary | ICD-10-CM

## 2015-01-17 DIAGNOSIS — K219 Gastro-esophageal reflux disease without esophagitis: Secondary | ICD-10-CM

## 2015-01-17 NOTE — Telephone Encounter (Signed)
Dr Everlene Farrier, I pended 6 meds, the 4 that you have been Rxing and also pantoprazole and Senokot. You haven't Rx'd these two for pt before but thought that you might as PCP. I did not pend the Keppra and Dilantin that the neurologist has been Rxing since you referred pt there and I think you prefer for them to manage these. I pended the 6 meds for a whole year since I know you often do this, but please change # of RFs if you would like less.

## 2015-01-17 NOTE — Telephone Encounter (Signed)
Please send all future medications to [facility].     Send all eight of his medications now.   Daughter  [phone]

## 2015-01-18 MED ORDER — AMLODIPINE BESYLATE 10 MG PO TABS: 10.0000 mg | ORAL_TABLET | Freq: Every day | ORAL | Status: DC

## 2015-01-18 MED ORDER — ATORVASTATIN CALCIUM 40 MG PO TABS: 40.0000 mg | ORAL_TABLET | Freq: Every day | ORAL | Status: DC

## 2015-01-18 MED ORDER — SENNOSIDES-DOCUSATE SODIUM 8.6-50 MG PO TABS: 2.0000 | ORAL_TABLET | Freq: Every day | ORAL | Status: DC

## 2015-01-18 MED ORDER — PANTOPRAZOLE SODIUM 40 MG PO TBEC: 40.0000 mg | DELAYED_RELEASE_TABLET | Freq: Every day | ORAL | Status: DC

## 2015-01-18 MED ORDER — LISINOPRIL 40 MG PO TABS
40.0000 mg | ORAL_TABLET | Freq: Every day | ORAL | Status: DC
Start: 2015-01-18 — End: 2015-04-09

## 2015-01-18 MED ORDER — HYDROCHLOROTHIAZIDE 12.5 MG PO CAPS: 12.5000 mg | ORAL_CAPSULE | Freq: Every day | ORAL | Status: DC

## 2015-01-18 NOTE — Telephone Encounter (Signed)
I have sent prescriptions in

## 2015-01-22 NOTE — Telephone Encounter (Signed)
Called daughter to make sure she was aware that the Rxs had been sent to [facility].

## 2015-01-29 ENCOUNTER — Inpatient Hospital Stay: Payer: Medicare Other | Admitting: Physical Medicine & Rehabilitation

## 2015-02-03 ENCOUNTER — Telehealth: Payer: Self-pay

## 2015-02-03 NOTE — Telephone Encounter (Signed)
Martin Edwards states they cancelled her dad's appt for tomorrow but wanted to speak with someone to see if it is necessary for him to come in Please call [phone]

## 2015-02-04 ENCOUNTER — Ambulatory Visit: Payer: Medicare Other | Admitting: Emergency Medicine

## 2015-02-08 NOTE — Telephone Encounter (Signed)
Dr. Daub please advise. 

## 2015-02-09 NOTE — Telephone Encounter (Signed)
Call patient and schedule patient to come in Thursday around 1 for recheck.. Be sure scheduling knowspatient is coming in next Thursday. Be sure you speak with the daughter.

## 2015-02-10 NOTE — Telephone Encounter (Signed)
Left message for pt to call back  °

## 2015-02-11 NOTE — Telephone Encounter (Signed)
Patient's daughter returned call. She is fine with bringing her father in on Thursday at 1pm. Can someone schedule this time slot for the patient?

## 2015-02-12 ENCOUNTER — Other Ambulatory Visit: Payer: Self-pay | Admitting: Neurosurgery

## 2015-02-12 DIAGNOSIS — S065X9A Traumatic subdural hemorrhage with loss of consciousness of unspecified duration, initial encounter: Secondary | ICD-10-CM

## 2015-02-12 DIAGNOSIS — S065XAA Traumatic subdural hemorrhage with loss of consciousness status unknown, initial encounter: Secondary | ICD-10-CM

## 2015-02-13 ENCOUNTER — Emergency Department (HOSPITAL_COMMUNITY): Payer: Medicare Other

## 2015-02-13 ENCOUNTER — Ambulatory Visit (INDEPENDENT_AMBULATORY_CARE_PROVIDER_SITE_OTHER): Payer: Medicare Other | Admitting: Emergency Medicine

## 2015-02-13 ENCOUNTER — Encounter (HOSPITAL_COMMUNITY): Payer: Self-pay | Admitting: Emergency Medicine

## 2015-02-13 ENCOUNTER — Encounter: Payer: Self-pay | Admitting: Emergency Medicine

## 2015-02-13 ENCOUNTER — Ambulatory Visit (INDEPENDENT_AMBULATORY_CARE_PROVIDER_SITE_OTHER): Payer: Medicare Other

## 2015-02-13 ENCOUNTER — Emergency Department (HOSPITAL_COMMUNITY)
Admission: EM | Admit: 2015-02-13 | Discharge: 2015-02-13 | Disposition: A | Discharge status: Home / Self Care | Payer: Medicare Other | Attending: Emergency Medicine | Admitting: Emergency Medicine

## 2015-02-13 VITALS — BP 151/84 | HR 123 | Temp 99.7°F | Resp 16 | Ht 69.0 in | Wt 165.2 lb

## 2015-02-13 DIAGNOSIS — Z8719 Personal history of other diseases of the digestive system: Secondary | ICD-10-CM | POA: Insufficient documentation

## 2015-02-13 DIAGNOSIS — R000 Tachycardia, unspecified: Secondary | ICD-10-CM

## 2015-02-13 DIAGNOSIS — I10 Essential (primary) hypertension: Secondary | ICD-10-CM | POA: Diagnosis not present

## 2015-02-13 DIAGNOSIS — R51 Headache: Secondary | ICD-10-CM | POA: Diagnosis not present

## 2015-02-13 DIAGNOSIS — R509 Fever, unspecified: Secondary | ICD-10-CM

## 2015-02-13 DIAGNOSIS — Z87442 Personal history of urinary calculi: Secondary | ICD-10-CM | POA: Diagnosis not present

## 2015-02-13 DIAGNOSIS — Z8546 Personal history of malignant neoplasm of prostate: Secondary | ICD-10-CM | POA: Insufficient documentation

## 2015-02-13 DIAGNOSIS — Z85828 Personal history of other malignant neoplasm of skin: Secondary | ICD-10-CM | POA: Diagnosis not present

## 2015-02-13 DIAGNOSIS — Z79899 Other long term (current) drug therapy: Secondary | ICD-10-CM | POA: Insufficient documentation

## 2015-02-13 DIAGNOSIS — I6200 Nontraumatic subdural hemorrhage, unspecified: Secondary | ICD-10-CM | POA: Diagnosis not present

## 2015-02-13 DIAGNOSIS — E785 Hyperlipidemia, unspecified: Secondary | ICD-10-CM | POA: Diagnosis not present

## 2015-02-13 LAB — POCT CBC
Granulocyte percent: 71.2 %G (ref 37–80)
HCT, POC: 46.7 % (ref 43.5–53.7)
Hemoglobin: 15.4 g/dL (ref 14.1–18.1)
LYMPH, POC: 1.5 (ref 0.6–3.4)
MCH, POC: 29.7 pg (ref 27–31.2)
MCHC: 33 g/dL (ref 31.8–35.4)
MCV: 90 fL (ref 80–97)
MID (cbc): 0.2 (ref 0–0.9)
MPV: 8.9 fL (ref 0–99.8)
POC Granulocyte: 4.2 (ref 2–6.9)
POC LYMPH %: 25.6 % (ref 10–50)
POC MID %: 3.2 %M (ref 0–12)
Platelet Count, POC: 189 10*3/uL (ref 142–424)
RBC: 5.19 M/uL (ref 4.69–6.13)
RDW, POC: 14.3 %
WBC: 5.9 10*3/uL (ref 4.6–10.2)

## 2015-02-13 LAB — COMPREHENSIVE METABOLIC PANEL
ALT: 33 U/L (ref 17–63)
AST: 29 U/L (ref 15–41)
Albumin: 4 g/dL (ref 3.5–5.0)
Alkaline Phosphatase: 99 U/L (ref 38–126)
Anion gap: 7 (ref 5–15)
BUN: 7 mg/dL (ref 6–20)
CHLORIDE: 101 mmol/L (ref 101–111)
CO2: 29 mmol/L (ref 22–32)
CREATININE: 1.02 mg/dL (ref 0.61–1.24)
Calcium: 9.2 mg/dL (ref 8.9–10.3)
Glucose, Bld: 106 mg/dL — ABNORMAL HIGH (ref 65–99)
POTASSIUM: 3.9 mmol/L (ref 3.5–5.1)
Sodium: 137 mmol/L (ref 135–145)
TOTAL PROTEIN: 6.9 g/dL (ref 6.5–8.1)
Total Bilirubin: 0.4 mg/dL (ref 0.3–1.2)

## 2015-02-13 LAB — CBC WITH DIFFERENTIAL/PLATELET
Basophils Absolute: 0 10*3/uL (ref 0.0–0.1)
Basophils Relative: 0 % (ref 0–1)
Eosinophils Absolute: 0 10*3/uL (ref 0.0–0.7)
Eosinophils Relative: 0 % (ref 0–5)
HCT: 43.1 % (ref 39.0–52.0)
HEMOGLOBIN: 14.7 g/dL (ref 13.0–17.0)
LYMPHS ABS: 1.3 10*3/uL (ref 0.7–4.0)
LYMPHS PCT: 26 % (ref 12–46)
MCH: 30.6 pg (ref 26.0–34.0)
MCHC: 34.1 g/dL (ref 30.0–36.0)
MCV: 89.8 fL (ref 78.0–100.0)
Monocytes Absolute: 0.6 10*3/uL (ref 0.1–1.0)
Monocytes Relative: 11 % (ref 3–12)
Neutro Abs: 3.3 10*3/uL (ref 1.7–7.7)
Neutrophils Relative %: 63 % (ref 43–77)
Platelets: 178 10*3/uL (ref 150–400)
RBC: 4.8 MIL/uL (ref 4.22–5.81)
RDW: 13.1 % (ref 11.5–15.5)
WBC: 5.2 10*3/uL (ref 4.0–10.5)

## 2015-02-13 LAB — URINALYSIS, ROUTINE W REFLEX MICROSCOPIC
Bilirubin Urine: NEGATIVE
Glucose, UA: NEGATIVE mg/dL
KETONES UR: NEGATIVE mg/dL
LEUKOCYTES UA: NEGATIVE
Nitrite: NEGATIVE
PROTEIN: NEGATIVE mg/dL
Specific Gravity, Urine: 1.006 (ref 1.005–1.030)
UROBILINOGEN UA: 0.2 mg/dL (ref 0.0–1.0)
pH: 6 (ref 5.0–8.0)

## 2015-02-13 LAB — URINE MICROSCOPIC-ADD ON

## 2015-02-13 NOTE — Progress Notes (Addendum)
Patient ID: Martin Edwards, male   DOB: March 09, 1946, 69 y.o.   MRN: [id number]     This chart was scribed for Martin Queen, MD by Zola Button, Medical Scribe. This patient was seen in room 28 and the patient's care was started at 1:06 PM.   Chief Complaint:  Chief Complaint  Patient presents with  . Follow-up  . abdominal issues    HPI: Martin Edwards is a 69 y.o. male with a history of prostate cancer who reports to [facility] today for a follow-up. Patient has been having some abdominal bloating/gas that started 3 days ago. His temperature today is 99.7 F. He denies abdominal pain, diarrhea, dysuria, loss of bladder control, difficulty urinating, and appetite change.  Patient had two craniotomies 3 months ago following a subdural hematoma due to a fall. He states he has been wearing his helmet most of the time. He did have a right-sided headache 3 days ago which resolved the following day. He has not had any headaches since then. He does not typically have headaches. Daughter notes that he has been staying with her brother for the past few weeks, but will be staying with her again starting tonight.  Past Medical History  Diagnosis Date  . Hypertension   . Hyperlipidemia   . Kidney stones     "passed them"  . Prostate cancer 1999  . Basal cell carcinoma of face X 2  . Small bowel obstruction due to adhesions 01/30/2014   Past Surgical History  Procedure Laterality Date  . Prostatectomy  05/20/1998  . Colectomy  10/2010    "took out 8 inches of colon; wasn't cancer"  . Basal cell carcinoma excision  X 2    "off my face both times"  . Craniotomy Right 11/11/2014    Procedure: CRANIOTOMY HEMATOMA EVACUATION SUBDURAL;  Surgeon: Leeroy Cha, MD;  Location: [facility];  Service: Neurosurgery;  Laterality: Right;  . Craniotomy Right 11/13/2014    Procedure: Removal bone flap, insertion into the abdominal wall;  Surgeon: Leeroy Cha, MD;  Location: [place];  Service: Neurosurgery;   Laterality: Right;   Social History   Social History  . Marital Status: Divorced    Spouse Name: N/A  . Number of Children: 2  . Years of Education: 12   Occupational History  . retired    Social History Main Topics  . Smoking status: Never Smoker   . Smokeless tobacco: Never Used  . Alcohol Use: No     Comment: 01/30/2014 "maybe a beer twice/yr"  . Drug Use: No  . Sexual Activity: Not Currently   Other Topics Concern  . None   Social History Narrative   Exercise: 2-3 times a wk      Patient drinks 2 cups of caffeine daily.   Patient is right handed.   Family History  Problem Relation Age of Onset  . Cancer Father     Lung   No Known Allergies Prior to Admission medications   Medication Sig Start Date End Date Taking? Authorizing Provider  amLODipine (NORVASC) 10 MG tablet Take 1 tablet (10 mg total) by mouth daily. 01/18/15  Yes Darlyne Russian, MD  atorvastatin (LIPITOR) 40 MG tablet Take 1 tablet (40 mg total) by mouth daily. 01/18/15  Yes Darlyne Russian, MD  hydrochlorothiazide (MICROZIDE) 12.5 MG capsule Take 1 capsule (12.5 mg total) by mouth daily. 01/18/15  Yes Darlyne Russian, MD  levETIRAcetam (KEPPRA) 1000 MG tablet Take 1 tablet (  1,000 mg total) by mouth 2 (two) times daily. 01/08/15  Yes Kathrynn Ducking, MD  lisinopril (PRINIVIL,ZESTRIL) 40 MG tablet Take 1 tablet (40 mg total) by mouth daily. 01/18/15  Yes Darlyne Russian, MD  pantoprazole (PROTONIX) 40 MG tablet Take 1 tablet (40 mg total) by mouth at bedtime. 01/18/15  Yes Darlyne Russian, MD  phenytoin (DILANTIN) 100 MG ER capsule Take 3 capsules (300 mg total) by mouth at bedtime. 01/08/15  Yes Kathrynn Ducking, MD     ROS: The patient denies unintentional weight loss, chest pain, palpitations, wheezing, dyspnea on exertion, nausea, vomiting, abdominal pain, dysuria, hematuria, melena, numbness, weakness, or tingling.   All other systems have been reviewed and were otherwise negative with the exception of those  mentioned in the HPI and as above.    PHYSICAL EXAM: Filed Vitals:   02/13/15 1257  BP: 151/84  Pulse: 123  Temp: 99.7 F (37.6 C)  Resp: 16   Body mass index is 24.38 kg/(m^2).   General: Alert, no acute distress HEENT:  Oropharynx patent. 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 Eye: [facility], [facility] Cardiovascular:  Tachycardic, no rubs murmurs or gallops.  No Carotid bruits, radial pulse intact. No pedal edema.  Respiratory: Clear to auscultation bilaterally.  No wheezes, rales, or rhonchi.  No cyanosis, no use of accessory musculature Abdominal: Palpable implanted skull right upper abdomen. No tenderness on left side. Musculoskeletal: Gait intact. No edema, tenderness Skin: No rashes. Neurologic: Facial musculature symmetric. Psychiatric: Patient acts appropriately throughout our interaction. Lymphatic: No cervical or submandibular lymphadenopathy Genitourinary/Anorectal: No acute findings    LABS: Results for orders placed or performed in visit on 02/13/15  POCT CBC  Result Value Ref Range   WBC 5.9 4.6 - 10.2 K/uL   Lymph, poc 1.5 0.6 - 3.4   POC LYMPH PERCENT 25.6 10 - 50 %L   MID (cbc) 0.2 0 - 0.9   POC MID % 3.2 0 - 12 %M   POC Granulocyte 4.2 2 - 6.9   Granulocyte percent 71.2 37 - 80 %G   RBC 5.19 4.69 - 6.13 M/uL   Hemoglobin 15.4 14.1 - 18.1 g/dL   HCT, POC 46.7 43.5 - 53.7 %   MCV 90.0 80 - 97 fL   MCH, POC 29.7 27 - 31.2 pg   MCHC 33.0 31.8 - 35.4 g/dL   RDW, POC 14.3 %   Platelet Count, POC 189.0 142 - 424 K/uL   MPV 8.9 0 - 99.8 fL      EKG/XRAY:   Primary read interpreted by Dr. Everlene Farrier at [facility]. There are inferior ST changes. There is a sinus tachycardia. [facility] reading (PRIMARY) by  Elzena Muston chest x-ray shows no evidence of consolidated pneumonia.   ASSESSMENT/PLAN: Patient is status post evacuation of a subdural hematoma. The skull flap was placed in his abdomen. He presents today for follow-up. He is tachycardic with low-grade  fever. He had a headache on Monday which is now gone. CBC was done .white count was normal which is reassuring.he was not able to get a urine.Marland Kitchen He does have ST changes on his EKG primarily inferiorly which may be rate related. He was sent to the hospital because of the inferior ST changes. No definite source for his fever was found. Dr. Joya Salm his neurosurgeon was called regarding this patient.   Gross sideeffects, risk and benefits, and alternatives of medications d/w patient. Patient is aware that all medications have potential sideeffects and we  are unable to predict every sideeffect or drug-drug interaction that may occur.  Martin Queen MD 02/13/2015 1:14 PM

## 2015-02-13 NOTE — Discharge Instructions (Signed)
As discussed, your evaluation today has been largely reassuring.  But, it is important that you monitor your condition carefully, and do not hesitate to return to the ED if you develop new, or concerning changes in your condition. ? ?Otherwise, please follow-up with your physician for appropriate ongoing care. ? ?

## 2015-02-13 NOTE — ED Notes (Addendum)
Per EMS- pt sent here from Urgent Care. Pt went to Urgent care for a follow up appointment post surgery for his subdural hematoma in may. PT reports his only complaint is a headache Monday No headache presently. MD sent him here for a slight fever and tachycardia 120s.

## 2015-02-13 NOTE — ED Provider Notes (Signed)
CSN: [id number]     Arrival date & time 02/13/15  1438 History   First MD Initiated Contact with Patient 02/13/15 1504     Chief Complaint  Patient presents with  . Tachycardia     (Consider location/radiation/quality/duration/timing/severity/associated sxs/prior Treatment) HPI Patient presents with his daughter who assists with the history of present illness. Over the past 2 days patient has had headache, fever, and today went to his primary care physician's office. There is mildly tachycardic, sent here for evaluation. Patient states that prior to 2 days ago he was generally well, recovering from procedure 3 months ago, without complication. Over the past 2 days the headache has been diffuse, mild, intermittent, with no new nausea, vomiting, visual changes. Fever has been subjective. No confusion, disorientation, vomiting, diarrhea. From a scope patient had spontaneous subdural hematoma, requiring craniotomy. Patient is scheduled for neurosurgery follow-up this week. Currently the patient denies focal pain, including no current headache, chest pain. He also denies any chills palpitations, dyspnea.  Past Medical History  Diagnosis Date  . Hypertension   . Hyperlipidemia   . Kidney stones     "passed them"  . Prostate cancer 1999  . Basal cell carcinoma of face X 2  . Small bowel obstruction due to adhesions 01/30/2014   Past Surgical History  Procedure Laterality Date  . Prostatectomy  05/20/1998  . Colectomy  10/2010    "took out 8 inches of colon; wasn't cancer"  . Basal cell carcinoma excision  X 2    "off my face both times"  . Craniotomy Right 11/11/2014    Procedure: CRANIOTOMY HEMATOMA EVACUATION SUBDURAL;  Surgeon: Leeroy Cha, MD;  Location: [place] [facility];  Service: Neurosurgery;  Laterality: Right;  . Craniotomy Right 11/13/2014    Procedure: Removal bone flap, insertion into the abdominal wall;  Surgeon: Leeroy Cha, MD;  Location: [place] [facility];  Service:  Neurosurgery;  Laterality: Right;   Family History  Problem Relation Age of Onset  . Cancer Father     Lung   Social History  Substance Use Topics  . Smoking status: Never Smoker   . Smokeless tobacco: Never Used  . Alcohol Use: No     Comment: 01/30/2014 "maybe a beer twice/yr"    Review of Systems  Constitutional:       Per HPI, otherwise negative  HENT:       Per HPI, otherwise negative  Respiratory:       Per HPI, otherwise negative  Cardiovascular:       Per HPI, otherwise negative  Gastrointestinal: Negative for nausea and vomiting.  Endocrine:       Negative aside from HPI  Genitourinary:       Neg aside from HPI   Musculoskeletal:       Per HPI, otherwise negative  Skin: Negative.  Negative for wound.  Neurological: Negative for syncope.      Allergies  Review of patient's allergies indicates no known allergies.  Home Medications   Prior to Admission medications   Medication Sig Start Date End Date Taking? Authorizing Provider  amLODipine (NORVASC) 10 MG tablet Take 1 tablet (10 mg total) by mouth daily. 01/18/15   Darlyne Russian, MD  atorvastatin (LIPITOR) 40 MG tablet Take 1 tablet (40 mg total) by mouth daily. 01/18/15   Darlyne Russian, MD  hydrochlorothiazide (MICROZIDE) 12.5 MG capsule Take 1 capsule (12.5 mg total) by mouth daily. 01/18/15   Darlyne Russian, MD  levETIRAcetam (KEPPRA) 1000 MG  tablet Take 1 tablet (1,000 mg total) by mouth 2 (two) times daily. 01/08/15   Kathrynn Ducking, MD  lisinopril (PRINIVIL,ZESTRIL) 40 MG tablet Take 1 tablet (40 mg total) by mouth daily. 01/18/15   Darlyne Russian, MD  pantoprazole (PROTONIX) 40 MG tablet Take 1 tablet (40 mg total) by mouth at bedtime. 01/18/15   Darlyne Russian, MD  phenytoin (DILANTIN) 100 MG ER capsule Take 3 capsules (300 mg total) by mouth at bedtime. 01/08/15   Kathrynn Ducking, MD   Pulse 94  Temp(Src) 98.8 F (37.1 C) (Oral)  Resp 12  Ht 5\' 10"  (1.778 m)  Wt 165 lb (74.844 kg)  BMI 23.68 kg/m2  SpO2  98% Physical Exam  Constitutional: He is oriented to person, place, and time. He appears well-developed. He has a sickly appearance. No distress.  HENT:  Head: Normocephalic and atraumatic.    Eyes: Conjunctivae and EOM are normal.  Neck: Neck supple. No rigidity. No edema, no erythema and normal range of motion present.  Cardiovascular: Normal rate and regular rhythm.   Pulmonary/Chest: Effort normal. No stridor. No respiratory distress.  Abdominal: He exhibits no distension. There is no tenderness.  Musculoskeletal: He exhibits no edema.  Neurological: He is alert and oriented to person, place, and time. He displays atrophy. He displays no tremor. No cranial nerve deficit or sensory deficit. He exhibits normal muscle tone. He displays no seizure activity. Coordination normal.  Skin: Skin is warm and dry.  Psychiatric: He has a normal mood and affect.  Nursing note and vitals reviewed.   ED Course  Procedures (including critical care time) Labs Review Labs Reviewed  CBC WITH DIFFERENTIAL/PLATELET  COMPREHENSIVE METABOLIC PANEL  URINALYSIS, ROUTINE W REFLEX MICROSCOPIC (NOT AT [facility])    Imaging Review Dg Chest 2 View  02/13/2015   CLINICAL DATA:  Fever.  EXAM: CHEST  2 VIEW  COMPARISON:  11/14/2014  FINDINGS: Midline trachea. Normal heart size. Atherosclerosis in the transverse aorta. No pleural effusion or pneumothorax. Clear lungs.  IMPRESSION: No acute cardiopulmonary disease.  Aortic atherosclerosis.   Electronically Signed   By: Abigail Miyamoto M.D.   On: 02/13/2015 15:26   Ct Head Wo Contrast  02/13/2015   CLINICAL DATA:  69 year old who underwent evacuation of a right cerebral convexity subdural hematoma in June, 2016. Patient had a headache three days ago which has subsequently resolved. He presents today with fever and tachycardia.  EXAM: CT HEAD WITHOUT CONTRAST  TECHNIQUE: Contiguous axial images were obtained from the base of the skull through the vertex without intravenous  contrast.  COMPARISON:  12/17/2014 dating back to 11/11/2014.  FINDINGS: Prior right frontotemporal craniectomy. Low-attenuation involving the right basal ganglia and the deep white matter of the right frontal and temporal lobes, likely the residual of the prior cerebral edema at the time of the acute subdural hematoma earlier this year. Ventricular system normal in size and appearance for age. No mass lesion. No midline shift. No acute hemorrhage or hematoma. No extra-axial fluid collections. No evidence of acute infarction.  Visualized paranasal sinuses, bilateral mastoid air cells and bilateral middle ear cavities well-aerated. Severe bilateral carotid siphon atherosclerosis.  IMPRESSION: 1. No acute intracranial abnormality. 2. Low attenuation involving the right basal ganglia in the deep white matter of the right frontal and temporal lobes, likely the residua of the prior right hemispheric cerebral edema at the time of the acute subdural hematoma in June, 2016. 3. Prior right frontotemporal craniectomy.   Electronically Signed  By: Evangeline Dakin M.D.   On: 02/13/2015 16:15   I have personally reviewed and evaluated these images and lab results as part of my medical decision-making.   EKG Interpretation   Date/Time:  Thursday February 13 2015 14:43:52 EDT Ventricular Rate:  100 PR Interval:    QRS Duration: 101 QT Interval:  324 QTC Calculation: 418 R Axis:   91 Text Interpretation:  Atrial fibrillation Right axis deviation Borderline  repolarization abnormality Baseline wander in lead(s) V2 Sinus rhythm  Artifact Abnormal ekg Confirmed by Carmin Muskrat  MD [phone]) on 02/13/2015  3:41:33 PM     EKG at the patient's physician's office had rate 107, minimal ST depressions inferiorly, abnormal I reviewed the x-ray performed of the physicians office, unremarkable    MDM  Patient presents with concern of palpitations, recent fever. Here the patient is awake, alert, interacting  appropriate, with no evidence for infection, neurologic compromise. Patient has upcoming follow-up with his neurosurgeon, was discharged in stable condition to follow-up.  Carmin Muskrat, MD 02/14/15 [phone]

## 2015-02-13 NOTE — Addendum Note (Signed)
Addended by: Kem Boroughs D on: 02/13/2015 02:50 PM   Modules accepted: Orders

## 2015-02-14 ENCOUNTER — Telehealth: Payer: Self-pay

## 2015-02-14 ENCOUNTER — Telehealth: Payer: Self-pay | Admitting: Emergency Medicine

## 2015-02-14 LAB — TSH: TSH: 0.886 u[IU]/mL (ref 0.350–4.500)

## 2015-02-14 NOTE — Telephone Encounter (Signed)
See labs 

## 2015-02-14 NOTE — Telephone Encounter (Signed)
Please call the patient's daughter. See if they can drop off a urine specimen at some time in the near future. They can drop this off at 104 today are one day next week.

## 2015-02-14 NOTE — Telephone Encounter (Signed)
-----   Message from Darlyne Russian, MD sent at 02/14/2015  8:44 AM EDT ----- Thyroid test is normal B sure patient brings by urine you need to speak to his daughter who he is staying with he should follow-up next week to check his potassium he can be an add on either Tuesday or Thursday.

## 2015-02-14 NOTE — Telephone Encounter (Signed)
Tried to call Pt. Daughter but was unable to reach her. Left a message to call back to relay Dr. Perfecto Kingdom message.

## 2015-02-14 NOTE — Telephone Encounter (Signed)
Notes Recorded by Serita Kyle, Rad Tech on 02/14/2015 at 1:37 PM I spoke to his daughter. She said he had a urine done at the hospital yesterday so she did not understand why he needed to drop off a urine. She said his potassium levels were fine so she wanted to know why he was supposed to follow up.  She said they live in [place] so it is not easy to get him back and forth to [place].

## 2015-02-15 NOTE — Telephone Encounter (Signed)
That is fine. As long as they were done at the hospital. I just wanted to be sure they have been done.

## 2015-02-19 ENCOUNTER — Telehealth: Payer: Self-pay | Admitting: Emergency Medicine

## 2015-02-19 ENCOUNTER — Other Ambulatory Visit: Payer: Self-pay | Admitting: Neurosurgery

## 2015-02-19 NOTE — Telephone Encounter (Signed)
Left message for pt to call back  °

## 2015-02-19 NOTE — Telephone Encounter (Signed)
Please call daughter and see if we can schedule carotid Dopplers. I have scheduled this twice in the past in 2014 and 2015. If he is willing schedule bilateral carotid Dopplers with diagnosis of carotid bruit.

## 2015-02-20 ENCOUNTER — Other Ambulatory Visit: Payer: Medicare Other

## 2015-02-20 NOTE — Telephone Encounter (Signed)
Left message for pt to call back  °

## 2015-02-26 NOTE — Telephone Encounter (Signed)
Sent UTR letter.

## 2015-03-03 ENCOUNTER — Encounter (HOSPITAL_COMMUNITY)
Admission: RE | Admit: 2015-03-03 | Discharge: 2015-03-03 | Disposition: A | Discharge status: Home / Self Care | Payer: Medicare Other | Source: Ambulatory Visit | Attending: Neurosurgery | Admitting: Neurosurgery

## 2015-03-03 ENCOUNTER — Encounter (HOSPITAL_COMMUNITY): Payer: Self-pay

## 2015-03-03 DIAGNOSIS — Z79899 Other long term (current) drug therapy: Secondary | ICD-10-CM | POA: Insufficient documentation

## 2015-03-03 DIAGNOSIS — Z01812 Encounter for preprocedural laboratory examination: Secondary | ICD-10-CM | POA: Insufficient documentation

## 2015-03-03 DIAGNOSIS — I10 Essential (primary) hypertension: Secondary | ICD-10-CM | POA: Insufficient documentation

## 2015-03-03 DIAGNOSIS — Z8546 Personal history of malignant neoplasm of prostate: Secondary | ICD-10-CM | POA: Diagnosis not present

## 2015-03-03 DIAGNOSIS — R000 Tachycardia, unspecified: Secondary | ICD-10-CM | POA: Diagnosis not present

## 2015-03-03 DIAGNOSIS — E785 Hyperlipidemia, unspecified: Secondary | ICD-10-CM | POA: Insufficient documentation

## 2015-03-03 DIAGNOSIS — R9431 Abnormal electrocardiogram [ECG] [EKG]: Secondary | ICD-10-CM | POA: Insufficient documentation

## 2015-03-03 DIAGNOSIS — Z01818 Encounter for other preprocedural examination: Secondary | ICD-10-CM | POA: Insufficient documentation

## 2015-03-03 HISTORY — DX: Personal history of urinary calculi: Z87.442

## 2015-03-03 HISTORY — DX: Traumatic subdural hemorrhage with loss of consciousness status unknown, initial encounter: S06.5XAA

## 2015-03-03 HISTORY — DX: Unspecified convulsions: R56.9

## 2015-03-03 HISTORY — DX: Traumatic subdural hemorrhage with loss of consciousness of unspecified duration, initial encounter: S06.5X9A

## 2015-03-03 LAB — CBC
HCT: 43.2 % (ref 39.0–52.0)
Hemoglobin: 15 g/dL (ref 13.0–17.0)
MCH: 31.1 pg (ref 26.0–34.0)
MCHC: 34.7 g/dL (ref 30.0–36.0)
MCV: 89.6 fL (ref 78.0–100.0)
PLATELETS: 192 10*3/uL (ref 150–400)
RBC: 4.82 MIL/uL (ref 4.22–5.81)
RDW: 13.1 % (ref 11.5–15.5)
WBC: 5 10*3/uL (ref 4.0–10.5)

## 2015-03-03 LAB — BASIC METABOLIC PANEL
Anion gap: 9 (ref 5–15)
BUN: 7 mg/dL (ref 6–20)
CALCIUM: 9.8 mg/dL (ref 8.9–10.3)
CO2: 29 mmol/L (ref 22–32)
CREATININE: 1 mg/dL (ref 0.61–1.24)
Chloride: 101 mmol/L (ref 101–111)
GFR calc Af Amer: >60 mL/min (ref >60)
GLUCOSE: 162 mg/dL — AB (ref 65–99)
Potassium: 3.6 mmol/L (ref 3.5–5.1)
Sodium: 139 mmol/L (ref 135–145)

## 2015-03-03 NOTE — Progress Notes (Signed)
Patient denies chest pain, shob, palpitations, cardiologist. Reports PCP Dr. Everlene Farrier. Reports that he had a stress test > 10 years ago and that PCP was about to arrange another stress test for routine check until he fell and had a subdural hematoma. Notified Ebony Hail regarding tachycardia at PAT visit and recent ER visit for same

## 2015-03-03 NOTE — Pre-Procedure Instructions (Signed)
[facility]  03/03/2015       Your procedure is scheduled on September 23.  Report to [facility] Admitting at 8:45 A.M.  Call this number if you have problems the morning of surgery:  [phone]   Remember:  Do not eat food or drink liquids after midnight.  Take these medicines the morning of surgery with A SIP OF WATER: Amlodipine, Keppra, Pantoprazole,     STOP/ Do not take Aspirin, Aleve, Naproxen, Advil, Ibuprofen, Motrin, Vitamins, Herbs, or Supplements starting today   Do not wear jewelry, make-up or nail polish.  Do not wear lotions, powders, or perfumes.  You may wear deodorant.  Do not shave 48 hours prior to surgery.  Men may shave face and neck.  Do not bring valuables to the hospital.  [facility] is not responsible for any belongings or valuables.  Contacts, dentures or bridgework may not be worn into surgery.  Leave your suitcase in the car.  After surgery it may be brought to your room.  For patients admitted to the hospital, discharge time will be determined by your treatment team.  Patients discharged the day of surgery will not be allowed to drive home.    - Preparing for Surgery  Before surgery, you can play an important role.  Because skin is not sterile, your skin needs to be as free of germs as possible.  You can reduce the number of germs on you skin by washing with CHG (chlorahexidine gluconate) soap before surgery.  CHG is an antiseptic cleaner which kills germs and bonds with the skin to continue killing germs even after washing.  Please DO NOT use if you have an allergy to CHG or antibacterial soaps.  If your skin becomes reddened/irritated stop using the CHG and inform your nurse when you arrive at Short Stay.  Do not shave (including legs and underarms) for at least 48 hours prior to the first CHG shower.  You may shave your face.  Please follow these instructions carefully:   1.  Shower with CHG Soap the night before  surgery and the morning of Surgery.  2.  If you choose to wash your hair, wash your hair first as usual with your normal shampoo.  3.  After you shampoo, rinse your hair and body thoroughly to remove the shampoo.  4.  Use CHG as you would any other liquid soap.  You can apply CHG directly to the skin and wash gently with scrungie or a clean washcloth.  5.  Apply the CHG Soap to your body ONLY FROM THE NECK DOWN.  Do not use on open wounds or open sores.  Avoid contact with your eyes, ears, mouth and genitals (private parts).  Wash genitals (private parts) with your normal soap.  6.  Wash thoroughly, paying special attention to the area where your surgery will be performed.  7.  Thoroughly rinse your body with warm water from the neck down.  8.  DO NOT shower/wash with your normal soap after using and rinsing off the CHG Soap.  9.  Pat yourself dry with a clean towel.            10.  Wear clean pajamas.            11.  Place clean sheets on your bed the night of your first shower and do not sleep with pets.  Day of Surgery  Do not apply any lotions the morning of  surgery.  Please wear clean clothes to the hospital/surgery center.   Please read over the following fact sheets that you were given. Pain Booklet, Coughing and Deep Breathing, Blood Transfusion Information and Surgical Site Infection Prevention

## 2015-03-03 NOTE — Progress Notes (Signed)
Anesthesia Chart Review: Patient is a 69 year old male scheduled for cranioplasty using bone flap in abdomen on 03/07/15 by Dr. Joya Salm.  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 His PCP Dr. Everlene Farrier sent him to the ED via EMS on 02/13/15 for tachycardia (120's) with low grade fever and new inferior EKG changes. HR was down to 100 bpm in the ED, and there were no evidence of infection or neurologic compromise, so he was discharged home. I don't see that a troponin was done.   PCP is Dr. Everlene Farrier.  Neurologist is Dr. Floyde Parkins.  He does not have a cardiologist, but saw Dr. Doreatha Lew (retired from [facility] which is now a part of [facility]) in 2011 for a stress echo (see below). Patient denies history of chest pain, SOB, edema, palpitations. No syncope that he is aware of, but initially presented to the ED on 11/11/14 with feeling dizzy with history of falls (although he doesn't remember actual head trauma). His activity is limited to walking since the bone flap was removed, but he is able to walk 1/2 mile twice daily without CV symptoms. Prior to his SDH he was playing tennis three times a week.    PAT Vitals: HR 105, BP 164/83, T 36.9C, O2 sat 100%, RR 18.  Heart RRR, no murmurs noted. Lungs clear. No carotid bruits noted. 2+ radial pulses bilateral. No pre-tibial edema.   Meds include amlodipine, Lipitor, HCTZ, Keppra, lisinopril, Protonix, Dilantin.   Neck CTA was done 07/25/09 (ordered by cardiologist Dr. Doreatha Lew) to evaluation for loud supraclavicular bruit and showed:  IMPRESSION: 1. Slightly less than 50% stenosis at the origin of  the left subclavian artery. 2. Minimal atherosclerotic calcifications at the carotid bifurcations bilaterally without significant stenosis. 3. Calcifications at the origins of the vertebral arteries bilaterally with less than 50% stenosis at either origin. The left vertebral artery is the dominant vessel. 4. Degenerative endplate changes at P7-1 and C6-7. Osseous foraminal narrowing is greatest at C5-6.  11/17/14 EEG: Interpretation: This EEG recording was abnormal generalized continuous slowing of cerebral activity which was more pronounced on the side. PLEDs are nonspecific and can be seen with a wide variety of hemispheric encephalopathies, although most commonly seen with strokes. No evidence of ongoing epileptic activity was recorded.  02/13/15 CXR: IMPRESSION: No acute cardiopulmonary disease. Aortic atherosclerosis.  Preoperative labs noted.   Stress echo negative ischemia 08/03/09.   03/03/15 EKG: NSR, possible LAE, ST/T wave abnormality, consider inferior ischemia. Inferior T wave abnormality appears stable since 02/13/15 tracing (which was new when compared to 11/11/14 tracing).  These three EKGs reviewed with anesthesiologists Dr. Ermalene Postin and Dr. Lissa Hoard. There was concern that patient may have underlying aflutter. Cardiologist Dr. Angelena Form is reading EKGs today, and was paged to review these prior to patient leaving PAT. He reviewed 02/13/15 and 03/03/15 tracings and felt that they were in fact SR. He confirmed with one of his EP partners. Discussed again with Dr. Lissa Hoard. Plan to repeat EKG on the day of surgery to re-evaluate rhythm. Patient notified of need to repeat EKG on the day of surgery. We discussed CV symptoms that could warrant an ED visit, but currently he is asymptomatic without  significant tachycardia.   George Hugh [facility] Short Stay Center/Anesthesiology Phone [phone] 03/03/2015 3:57 PM

## 2015-03-03 NOTE — Progress Notes (Addendum)
Spoke to [place] in [place] and requested that hypoallergenic cart arrive in short stay surgical at 8:30 Friday d/t questionable reaction to laundry detergent last time he was here. Notified Janett Billow, RN in Neuro OR regarding linen.

## 2015-03-06 DIAGNOSIS — I6200 Nontraumatic subdural hemorrhage, unspecified: Secondary | ICD-10-CM | POA: Diagnosis not present

## 2015-03-06 DIAGNOSIS — I10 Essential (primary) hypertension: Secondary | ICD-10-CM | POA: Diagnosis not present

## 2015-03-06 NOTE — H&P (Signed)
[facility] is an 69 y.o. male.   Chief Complaint: cranial defect post op. HPI: patient who was admitted for emergency craniotomy in may of this year because a subdural hematoma with drainage and a second procedure for recurrence leaving a bone defect with harvesting of the flap in the abdominal wall. He had seizures and he is being f/u by neurology and rehabilitation medicie. i saw him today and a ct head showed normal post op changes. He is being admitted to repair the cranial defect.  Past Medical History  Diagnosis Date  . Hypertension   . Hyperlipidemia   . Prostate cancer 1999  . Basal cell carcinoma of face X 2  . Small bowel obstruction due to adhesions 01/30/2014  . History of kidney stones   . Subdural hematoma   . Seizures     Past Surgical History  Procedure Laterality Date  . Prostatectomy  05/20/1998  . Colectomy  10/2010    "took out 8 inches of colon; wasn't cancer"  . Basal cell carcinoma excision  X 2    "off my face both times"  . Craniotomy Right 11/11/2014    Procedure: CRANIOTOMY HEMATOMA EVACUATION SUBDURAL;  Surgeon: Leeroy Cha, MD;  Location: [place] [facility];  Service: Neurosurgery;  Laterality: Right;  . Craniotomy Right 11/13/2014    Procedure: Removal bone flap, insertion into the abdominal wall;  Surgeon: Leeroy Cha, MD;  Location: [place] [facility];  Service: Neurosurgery;  Laterality: Right;    Family History  Problem Relation Age of Onset  . Cancer Father     Lung   Social History:  reports that he has never smoked. He has never used smokeless tobacco. He reports that he does not drink alcohol or use illicit drugs.  Allergies:  Allergies  Allergen Reactions  . Other Rash    Needs Hypoallergenic Sheets/ Laundry Cart had questionable allergic reaction to detergent last time he was here.    No prescriptions prior to admission    No results found for this or any previous visit (from the past 48 hour(s)). No results found.  Review of Systems   Constitutional: Negative.   HENT: Negative.   Eyes: Negative.   Respiratory: Negative.   Cardiovascular: Negative.   Gastrointestinal: Negative.   Genitourinary: Negative.   Musculoskeletal: Negative.   Skin: Negative.   Neurological: Positive for seizures.  Endo/Heme/Allergies: Negative.   Psychiatric/Behavioral: Negative.     There were no vitals taken for this visit. Physical Exam hent, skull defect right side. Neck, nl. Cv, nl. Lungs, clear. Abdomen ,scar in the ruq with bone flap in the abdominal wall. Extremiteis, n. NEURO ORIENTED X3. NO WEAKNESS. CN, NL.  Assessment/Plan Repair of the cranial defect. He and the lady who came with him are aware of risks such as infection, csf leak, hematona, need of further surgery  BOTERO,ERNESTO M 03/06/2015, 6:06 PM

## 2015-03-07 ENCOUNTER — Encounter (HOSPITAL_COMMUNITY): Payer: Self-pay | Admitting: General Practice

## 2015-03-07 ENCOUNTER — Inpatient Hospital Stay (HOSPITAL_COMMUNITY): Payer: Medicare Other | Admitting: Vascular Surgery

## 2015-03-07 ENCOUNTER — Inpatient Hospital Stay (HOSPITAL_COMMUNITY): Payer: Medicare Other | Admitting: Certified Registered Nurse Anesthetist

## 2015-03-07 ENCOUNTER — Encounter (HOSPITAL_COMMUNITY)
Admission: RE | Disposition: A | Discharge status: Home / Self Care | Payer: Self-pay | Source: Ambulatory Visit | Attending: Neurosurgery

## 2015-03-07 ENCOUNTER — Inpatient Hospital Stay (HOSPITAL_COMMUNITY)
Admission: RE | Admit: 2015-03-07 | Discharge: 2015-03-10 | DRG: 517 | Disposition: A | Discharge status: Home / Self Care | Payer: Medicare Other | Source: Ambulatory Visit | Attending: Neurosurgery | Admitting: Neurosurgery

## 2015-03-07 ENCOUNTER — Inpatient Hospital Stay: Payer: Medicare Other | Admitting: Physical Medicine & Rehabilitation

## 2015-03-07 DIAGNOSIS — S065X0D Traumatic subdural hemorrhage without loss of consciousness, subsequent encounter: Secondary | ICD-10-CM

## 2015-03-07 DIAGNOSIS — Z9049 Acquired absence of other specified parts of digestive tract: Secondary | ICD-10-CM | POA: Diagnosis not present

## 2015-03-07 DIAGNOSIS — Z87442 Personal history of urinary calculi: Secondary | ICD-10-CM

## 2015-03-07 DIAGNOSIS — Z8546 Personal history of malignant neoplasm of prostate: Secondary | ICD-10-CM

## 2015-03-07 DIAGNOSIS — Z9889 Other specified postprocedural states: Secondary | ICD-10-CM

## 2015-03-07 DIAGNOSIS — M952 Other acquired deformity of head: Principal | ICD-10-CM | POA: Diagnosis present

## 2015-03-07 DIAGNOSIS — Z85828 Personal history of other malignant neoplasm of skin: Secondary | ICD-10-CM

## 2015-03-07 DIAGNOSIS — R569 Unspecified convulsions: Secondary | ICD-10-CM | POA: Diagnosis not present

## 2015-03-07 DIAGNOSIS — E785 Hyperlipidemia, unspecified: Secondary | ICD-10-CM | POA: Diagnosis present

## 2015-03-07 DIAGNOSIS — Z9079 Acquired absence of other genital organ(s): Secondary | ICD-10-CM | POA: Diagnosis not present

## 2015-03-07 DIAGNOSIS — Z91048 Other nonmedicinal substance allergy status: Secondary | ICD-10-CM | POA: Diagnosis not present

## 2015-03-07 DIAGNOSIS — I10 Essential (primary) hypertension: Secondary | ICD-10-CM | POA: Diagnosis present

## 2015-03-07 DIAGNOSIS — I6200 Nontraumatic subdural hemorrhage, unspecified: Secondary | ICD-10-CM | POA: Diagnosis not present

## 2015-03-07 HISTORY — PX: CRANIOTOMY: SHX93

## 2015-03-07 SURGERY — CRANIOTOMY BONE FLAP/PROSTHETIC PLATE
Anesthesia: General

## 2015-03-07 MED ORDER — SODIUM CHLORIDE 0.9 % IV SOLN
INTRAVENOUS | Status: DC | PRN
  Administered 2015-03-07 (×2): via INTRAVENOUS

## 2015-03-07 MED ORDER — LIDOCAINE HCL (CARDIAC) 20 MG/ML IV SOLN
INTRAVENOUS | Status: DC | PRN
  Administered 2015-03-07: 80 mg via INTRAVENOUS

## 2015-03-07 MED ORDER — NEOSTIGMINE METHYLSULFATE 10 MG/10ML IV SOLN
INTRAVENOUS | Status: AC
  Filled 2015-03-07: qty 2

## 2015-03-07 MED ORDER — HYDROCODONE-ACETAMINOPHEN 5-325 MG PO TABS
1.0000 | ORAL_TABLET | ORAL | Status: DC | PRN
  Administered 2015-03-07 – 2015-03-09 (×8): 1 via ORAL
  Filled 2015-03-07 (×10): qty 1

## 2015-03-07 MED ORDER — BACITRACIN ZINC 500 UNIT/GM EX OINT
TOPICAL_OINTMENT | CUTANEOUS | Status: DC | PRN
  Administered 2015-03-07: 1 via TOPICAL

## 2015-03-07 MED ORDER — PROPOFOL 10 MG/ML IV BOLUS
INTRAVENOUS | Status: DC | PRN
  Administered 2015-03-07: 150 mg via INTRAVENOUS

## 2015-03-07 MED ORDER — CEFAZOLIN SODIUM 1-5 GM-% IV SOLN
1.0000 g | Freq: Three times a day (TID) | INTRAVENOUS | Status: AC
  Administered 2015-03-07 – 2015-03-08 (×2): 1 g via INTRAVENOUS
  Filled 2015-03-07 (×2): qty 50

## 2015-03-07 MED ORDER — LABETALOL HCL 5 MG/ML IV SOLN: 10.0000 mg | INTRAVENOUS | Status: DC | PRN

## 2015-03-07 MED ORDER — PANTOPRAZOLE SODIUM 40 MG PO TBEC
40.0000 mg | DELAYED_RELEASE_TABLET | Freq: Every day | ORAL | Status: DC
  Administered 2015-03-07 – 2015-03-10 (×4): 40 mg via ORAL
  Filled 2015-03-07 (×4): qty 1

## 2015-03-07 MED ORDER — ROCURONIUM BROMIDE 50 MG/5ML IV SOLN
INTRAVENOUS | Status: AC
  Filled 2015-03-07: qty 2

## 2015-03-07 MED ORDER — LEVETIRACETAM 500 MG PO TABS
1000.0000 mg | ORAL_TABLET | Freq: Two times a day (BID) | ORAL | Status: DC
  Administered 2015-03-07 – 2015-03-10 (×6): 1000 mg via ORAL
  Filled 2015-03-07 (×6): qty 2

## 2015-03-07 MED ORDER — PHENYLEPHRINE HCL 10 MG/ML IJ SOLN
INTRAMUSCULAR | Status: DC | PRN
  Administered 2015-03-07: 80 ug via INTRAVENOUS

## 2015-03-07 MED ORDER — PHENYLEPHRINE HCL 10 MG/ML IJ SOLN
10.0000 mg | INTRAVENOUS | Status: DC | PRN
  Administered 2015-03-07: 20 ug/min via INTRAVENOUS

## 2015-03-07 MED ORDER — FENTANYL CITRATE (PF) 100 MCG/2ML IJ SOLN
INTRAMUSCULAR | Status: DC | PRN
  Administered 2015-03-07: 100 ug via INTRAVENOUS
  Administered 2015-03-07 (×3): 50 ug via INTRAVENOUS

## 2015-03-07 MED ORDER — ATORVASTATIN CALCIUM 40 MG PO TABS
40.0000 mg | ORAL_TABLET | Freq: Every day | ORAL | Status: DC
  Administered 2015-03-07 – 2015-03-09 (×3): 40 mg via ORAL
  Filled 2015-03-07 (×3): qty 1

## 2015-03-07 MED ORDER — ROCURONIUM BROMIDE 100 MG/10ML IV SOLN
INTRAVENOUS | Status: DC | PRN
  Administered 2015-03-07 (×2): 10 mg via INTRAVENOUS
  Administered 2015-03-07: 40 mg via INTRAVENOUS

## 2015-03-07 MED ORDER — NEOSTIGMINE METHYLSULFATE 10 MG/10ML IV SOLN
INTRAVENOUS | Status: DC | PRN
  Administered 2015-03-07: 4 mg via INTRAVENOUS

## 2015-03-07 MED ORDER — ONDANSETRON HCL 4 MG/2ML IJ SOLN
INTRAMUSCULAR | Status: AC
  Filled 2015-03-07: qty 4

## 2015-03-07 MED ORDER — HYDROCHLOROTHIAZIDE 12.5 MG PO CAPS
12.5000 mg | ORAL_CAPSULE | Freq: Every day | ORAL | Status: DC
  Administered 2015-03-07 – 2015-03-10 (×3): 12.5 mg via ORAL
  Filled 2015-03-07 (×4): qty 1

## 2015-03-07 MED ORDER — GLYCOPYRROLATE 0.2 MG/ML IJ SOLN
INTRAMUSCULAR | Status: DC | PRN
  Administered 2015-03-07: 0.6 mg via INTRAVENOUS

## 2015-03-07 MED ORDER — LACTATED RINGERS IV SOLN
INTRAVENOUS | Status: DC
  Administered 2015-03-07 – 2015-03-08 (×2): via INTRAVENOUS

## 2015-03-07 MED ORDER — LIDOCAINE HCL 4 % MT SOLN
OROMUCOSAL | Status: DC | PRN
  Administered 2015-03-07: 4 mL via TOPICAL

## 2015-03-07 MED ORDER — FENTANYL CITRATE (PF) 250 MCG/5ML IJ SOLN
INTRAMUSCULAR | Status: AC
  Filled 2015-03-07: qty 5

## 2015-03-07 MED ORDER — HEMOSTATIC AGENTS (NO CHARGE) OPTIME
TOPICAL | Status: DC | PRN
  Administered 2015-03-07: 1 via TOPICAL

## 2015-03-07 MED ORDER — LISINOPRIL 20 MG PO TABS
40.0000 mg | ORAL_TABLET | Freq: Every day | ORAL | Status: DC
  Administered 2015-03-07 – 2015-03-10 (×3): 40 mg via ORAL
  Filled 2015-03-07 (×2): qty 2
  Filled 2015-03-07: qty 1

## 2015-03-07 MED ORDER — PROMETHAZINE HCL 25 MG PO TABS: 12.5000 mg | ORAL_TABLET | ORAL | Status: DC | PRN

## 2015-03-07 MED ORDER — AMLODIPINE BESYLATE 10 MG PO TABS
10.0000 mg | ORAL_TABLET | Freq: Every day | ORAL | Status: DC
  Administered 2015-03-09 – 2015-03-10 (×2): 10 mg via ORAL
  Filled 2015-03-07 (×2): qty 1

## 2015-03-07 MED ORDER — PHENYTOIN SODIUM EXTENDED 100 MG PO CAPS
300.0000 mg | ORAL_CAPSULE | Freq: Every day | ORAL | Status: DC
Start: 2015-03-07 — End: 2015-03-10
  Administered 2015-03-07 – 2015-03-09 (×3): 300 mg via ORAL
  Filled 2015-03-07 (×4): qty 3

## 2015-03-07 MED ORDER — DEXAMETHASONE SODIUM PHOSPHATE 4 MG/ML IJ SOLN
INTRAMUSCULAR | Status: DC | PRN
  Administered 2015-03-07: 4 mg via INTRAVENOUS

## 2015-03-07 MED ORDER — CEFAZOLIN SODIUM-DEXTROSE 2-3 GM-% IV SOLR
2.0000 g | INTRAVENOUS | Status: AC
  Administered 2015-03-07: 2 g via INTRAVENOUS
  Filled 2015-03-07: qty 50

## 2015-03-07 MED ORDER — SODIUM CHLORIDE 0.9 % IV SOLN: 500.0000 mg | Freq: Two times a day (BID) | INTRAVENOUS | Status: DC

## 2015-03-07 MED ORDER — 0.9 % SODIUM CHLORIDE (POUR BTL) OPTIME
TOPICAL | Status: DC | PRN
  Administered 2015-03-07 (×3): 1000 mL

## 2015-03-07 MED ORDER — EPHEDRINE SULFATE 50 MG/ML IJ SOLN
INTRAMUSCULAR | Status: DC | PRN
  Administered 2015-03-07: 10 mg via INTRAVENOUS
  Administered 2015-03-07: 5 mg via INTRAVENOUS

## 2015-03-07 MED ORDER — MORPHINE SULFATE (PF) 2 MG/ML IV SOLN: 1.0000 mg | INTRAVENOUS | Status: DC | PRN

## 2015-03-07 MED ORDER — LIDOCAINE-EPINEPHRINE 1 %-1:100000 IJ SOLN
INTRAMUSCULAR | Status: DC | PRN
  Administered 2015-03-07: 12 mL

## 2015-03-07 MED ORDER — GLYCOPYRROLATE 0.2 MG/ML IJ SOLN
INTRAMUSCULAR | Status: AC
  Filled 2015-03-07: qty 2

## 2015-03-07 MED ORDER — PANTOPRAZOLE SODIUM 40 MG IV SOLR: 40.0000 mg | Freq: Every day | INTRAVENOUS | Status: DC

## 2015-03-07 MED ORDER — ONDANSETRON HCL 4 MG/2ML IJ SOLN: 4.0000 mg | INTRAMUSCULAR | Status: DC | PRN

## 2015-03-07 MED ORDER — ONDANSETRON HCL 4 MG PO TABS: 4.0000 mg | ORAL_TABLET | ORAL | Status: DC | PRN

## 2015-03-07 MED ORDER — THROMBIN 5000 UNITS EX SOLR
CUTANEOUS | Status: DC | PRN
  Administered 2015-03-07 (×2): 5000 [IU] via TOPICAL

## 2015-03-07 MED ORDER — ONDANSETRON HCL 4 MG/2ML IJ SOLN
INTRAMUSCULAR | Status: DC | PRN
  Administered 2015-03-07 (×2): 4 mg via INTRAVENOUS

## 2015-03-07 SURGICAL SUPPLY — 69 items
BAG DECANTER FOR FLEXI CONT (MISCELLANEOUS) ×3 IMPLANT
BENZOIN TINCTURE PRP APPL 2/3 (GAUZE/BANDAGES/DRESSINGS) ×3 IMPLANT
BLADE CLIPPER SURG (BLADE) ×6 IMPLANT
BLADE SURG 10 STRL SS (BLADE) IMPLANT
BLADE SURG 15 STRL LF DISP TIS (BLADE) IMPLANT
BLADE SURG 15 STRL SS (BLADE)
BRUSH SCRUB EZ 1% IODOPHOR (MISCELLANEOUS) IMPLANT
CANISTER SUCT 3000ML PPV (MISCELLANEOUS) ×3 IMPLANT
CLIP RANEY DISP (INSTRUMENTS) ×9 IMPLANT
CLOSURE WOUND 1/2 X4 (GAUZE/BANDAGES/DRESSINGS)
CORDS BIPOLAR (ELECTRODE) IMPLANT
COVER BACK TABLE 60X90IN (DRAPES) IMPLANT
DRAIN JACKSON PRATT 1/4 1325 (MISCELLANEOUS) IMPLANT
DRAPE INCISE IOBAN 66X45 STRL (DRAPES) ×3 IMPLANT
DRAPE NEUROLOGICAL W/INCISE (DRAPES) ×6 IMPLANT
DRAPE PROXIMA HALF (DRAPES) ×3 IMPLANT
DRAPE WARM FLUID 44X44 (DRAPE) ×3 IMPLANT
DRSG OPSITE POSTOP 4X10 (GAUZE/BANDAGES/DRESSINGS) ×3 IMPLANT
DRSG OPSITE POSTOP 4X6 (GAUZE/BANDAGES/DRESSINGS) ×3 IMPLANT
DRSG OPSITE POSTOP 4X8 (GAUZE/BANDAGES/DRESSINGS) ×3 IMPLANT
DURAPREP 26ML APPLICATOR (WOUND CARE) ×3 IMPLANT
ELECT CAUTERY BLADE 6.4 (BLADE) IMPLANT
ELECT REM PT RETURN 9FT ADLT (ELECTROSURGICAL) ×3
ELECTRODE REM PT RTRN 9FT ADLT (ELECTROSURGICAL) ×1 IMPLANT
EVACUATOR SILICONE 100CC (DRAIN) IMPLANT
GAUZE SPONGE 4X4 12PLY STRL (GAUZE/BANDAGES/DRESSINGS) ×3 IMPLANT
GAUZE SPONGE 4X4 16PLY XRAY LF (GAUZE/BANDAGES/DRESSINGS) IMPLANT
GLOVE BIOGEL M 8.0 STRL (GLOVE) ×3 IMPLANT
GLOVE EXAM NITRILE LRG STRL (GLOVE) IMPLANT
GLOVE EXAM NITRILE MD LF STRL (GLOVE) IMPLANT
GLOVE EXAM NITRILE XL STR (GLOVE) IMPLANT
GLOVE EXAM NITRILE XS STR PU (GLOVE) IMPLANT
GOWN STRL REUS W/ TWL LRG LVL3 (GOWN DISPOSABLE) ×2 IMPLANT
GOWN STRL REUS W/ TWL XL LVL3 (GOWN DISPOSABLE) ×1 IMPLANT
GOWN STRL REUS W/TWL 2XL LVL3 (GOWN DISPOSABLE) ×3 IMPLANT
GOWN STRL REUS W/TWL LRG LVL3 (GOWN DISPOSABLE) ×4
GOWN STRL REUS W/TWL XL LVL3 (GOWN DISPOSABLE) ×2
GRAFT DURAGEN MATRIX 2WX2L ×3 IMPLANT
HEMOSTAT SURGICEL 2X14 (HEMOSTASIS) IMPLANT
KIT BASIN OR (CUSTOM PROCEDURE TRAY) ×3 IMPLANT
KIT ROOM TURNOVER OR (KITS) ×3 IMPLANT
NEEDLE HYPO 22GX1.5 SAFETY (NEEDLE) ×3 IMPLANT
NEEDLE HYPO 25X1 1.5 SAFETY (NEEDLE) ×3 IMPLANT
NS IRRIG 1000ML POUR BTL (IV SOLUTION) ×3 IMPLANT
PACK CRANIOTOMY (CUSTOM PROCEDURE TRAY) ×3 IMPLANT
PAD ARMBOARD 7.5X6 YLW CONV (MISCELLANEOUS) ×9 IMPLANT
PENCIL BUTTON HOLSTER BLD 10FT (ELECTRODE) IMPLANT
PIN MAYFIELD SKULL DISP (PIN) IMPLANT
PLATE 1.5  2HOLE LNG NEURO (Plate) ×4 IMPLANT
PLATE 1.5 2HOLE LNG NEURO (Plate) ×2 IMPLANT
PLATE 1.5/0.5 18.5MM BURR HOLE (Plate) ×6 IMPLANT
RUBBERBAND STERILE (MISCELLANEOUS) IMPLANT
SCREW SELF DRILL HT 1.5/4MM (Screw) ×36 IMPLANT
SPONGE LAP 4X18 X RAY DECT (DISPOSABLE) ×3 IMPLANT
SPONGE SURGIFOAM ABS GEL SZ50 (HEMOSTASIS) IMPLANT
STAPLER SKIN PROX WIDE 3.9 (STAPLE) ×3 IMPLANT
STOCKINETTE 6  STRL (DRAPES)
STOCKINETTE 6 STRL (DRAPES) IMPLANT
STRIP CLOSURE SKIN 1/2X4 (GAUZE/BANDAGES/DRESSINGS) IMPLANT
SUT VIC AB 2-0 CP2 18 (SUTURE) ×6 IMPLANT
SUT VIC AB 2-0 CT2 18 VCP726D (SUTURE) ×3 IMPLANT
SYR CONTROL 10ML LL (SYRINGE) ×3 IMPLANT
TOWEL OR 17X24 6PK STRL BLUE (TOWEL DISPOSABLE) ×6 IMPLANT
TOWEL OR 17X26 10 PK STRL BLUE (TOWEL DISPOSABLE) ×3 IMPLANT
TRAY FOLEY W/METER SILVER 14FR (SET/KITS/TRAYS/PACK) IMPLANT
TUBE CONNECTING 12'X1/4 (SUCTIONS)
TUBE CONNECTING 12X1/4 (SUCTIONS) IMPLANT
UNDERPAD 30X30 INCONTINENT (UNDERPADS AND DIAPERS) IMPLANT
WATER STERILE IRR 1000ML POUR (IV SOLUTION) ×3 IMPLANT

## 2015-03-07 NOTE — Anesthesia Preprocedure Evaluation (Addendum)
Anesthesia Evaluation  Patient identified by MRN, date of birth, ID band Patient awake    Reviewed: Allergy & Precautions, NPO status , Patient's Chart, lab work & pertinent test results, reviewed documented beta blocker date and time   Airway Mallampati: I  TM Distance: >3 FB Neck ROM: Full    Dental no notable dental hx. (+) Teeth Intact, Dental Advisory Given   Pulmonary neg pulmonary ROS,    Pulmonary exam normal breath sounds clear to auscultation       Cardiovascular hypertension, Pt. 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   Abdominal   Peds  Hematology negative hematology ROS (+)   Anesthesia Other Findings   Reproductive/Obstetrics                           Anesthesia Physical Anesthesia Plan  ASA: III  Anesthesia Plan: General   Post-op Pain Management:    Induction: Intravenous  Airway Management Planned: Oral ETT  Additional Equipment:   Intra-op Plan:   Post-operative Plan: Extubation in OR  Informed Consent: I have reviewed the patients History and Physical, chart, labs and discussed the procedure including the risks, benefits and alternatives for the proposed anesthesia with the patient or authorized representative who has indicated his/her understanding and acceptance.   Dental advisory given  Plan Discussed with: CRNA, Anesthesiologist and Surgeon  Anesthesia Plan Comments:         Anesthesia Quick Evaluation

## 2015-03-07 NOTE — Anesthesia Postprocedure Evaluation (Signed)
  Anesthesia Post-op Note  Patient: [facility]  Procedure(s) Performed: Procedure(s) with comments: Cranioplasty using Bone flap in abdomen (N/A) - Cranioplasty using Bone flap in abdomen  Patient Location: PACU  Anesthesia Type:General  Level of Consciousness: awake  Airway and Oxygen Therapy: Patient Spontanous Breathing  Post-op Pain: none  Post-op Assessment: Post-op Vital signs reviewed, Patient's Cardiovascular Status Stable, Respiratory Function Stable, Patent Airway, No signs of Nausea or vomiting and Pain level controlled LLE Motor Response: Purposeful movement LLE Sensation: No tingling, No numbness RLE Motor Response: Purposeful movement RLE Sensation: No tingling, No numbness      Post-op Vital Signs: Reviewed and stable  Last Vitals:  Filed Vitals:   03/07/15 1545  BP: 134/71  Pulse: 90  Temp:   Resp: 17    Complications: No apparent anesthesia complications

## 2015-03-07 NOTE — Transfer of Care (Signed)
Immediate Anesthesia Transfer of Care Note  Patient: [facility]  Procedure(s) Performed: Procedure(s) with comments: Cranioplasty using Bone flap in abdomen (N/A) - Cranioplasty using Bone flap in abdomen  Patient Location: PACU  Anesthesia Type:General  Level of Consciousness: awake, alert  and oriented  Airway & Oxygen Therapy: Patient Spontanous Breathing and Patient connected to nasal cannula oxygen  Post-op Assessment: Report given to RN and Post -op Vital signs reviewed and stable  Post vital signs: Reviewed and stable  Last Vitals:  Filed Vitals:   03/07/15 1527  BP:   Pulse:   Temp: 36.4 C  Resp:     Complications: No apparent anesthesia complications

## 2015-03-07 NOTE — Plan of Care (Signed)
Problem: Consults Goal: Diagnosis - Craniotomy Cranioplasty for previous R SDH

## 2015-03-07 NOTE — Anesthesia Procedure Notes (Signed)
Procedure Name: Intubation Date/Time: 03/07/2015 2:07 PM Performed by: Merdis Delay Pre-anesthesia Checklist: Patient identified, Timeout performed, Emergency Drugs available, Suction available and Patient being monitored Patient Re-evaluated:Patient Re-evaluated prior to inductionOxygen Delivery Method: Circle system utilized Preoxygenation: Pre-oxygenation with 100% oxygen Intubation Type: IV induction Ventilation: Mask ventilation without difficulty and Oral airway inserted - appropriate to patient size Laryngoscope Size: Mac and 3 Grade View: Grade II Tube type: Oral Tube size: 7.5 mm Number of attempts: 1 Airway Equipment and Method: Stylet and LTA kit utilized Placement Confirmation: ETT inserted through vocal cords under direct vision,  breath sounds checked- equal and bilateral,  positive ETCO2 and CO2 detector Secured at: 22 cm Tube secured with: Tape Dental Injury: Teeth and Oropharynx as per pre-operative assessment

## 2015-03-08 NOTE — Progress Notes (Signed)
Patient transferred to 5C06. Patient is alert and oriented, cognition appropriate, NIHHS 0, honeycomb dressing on head has old bloody marked drainage. Honeycomb dressing on right lower abdomen is clean, dry, and intact. Patient denies any pain. VSS. Patient and family oriented to room, unit, staff. Call bell within patient's reach, safety measures in place. Will continue to monitor closely.

## 2015-03-08 NOTE — Evaluation (Signed)
Physical Therapy Evaluation Patient Details Name: Martin Edwards MRN: [id number] DOB: 01-Sep-1945 Today's Date: 03/08/2015   History of Present Illness  69 y.o. male with history of craniotomy, seen for Repair of the skull defect using the bone flap which was harvested in the abdominal wall.  Clinical Impression  Patient is seen following the above procedure and presents with functional limitations due to the deficits listed below (see PT Problem List). Limited ambulatory distance today due to limited activity order in place at time of evaluation. However, pt demonstrates good dynamic and static stability without loss of balance during most tasks. Some difficulty in single leg stance on Lt and tandem stances. Anticipate he will progress very quickly and likely will not have need for follow-up PT at discharge. Will practice stairs and higher level balance activities prior to discharge. Patient will benefit from skilled PT to increase their independence and safety with mobility to allow discharge to the venue listed below.       Follow Up Recommendations  (TBD - Likely no need for follow-up PT at d/c)    Equipment Recommendations  None recommended by PT    Recommendations for Other Services       Precautions / Restrictions Precautions Precautions: None Restrictions Weight Bearing Restrictions: No      Mobility  Bed Mobility Overal bed mobility: Modified Independent             General bed mobility comments: extra time  Transfers Overall transfer level: Needs assistance Equipment used: None Transfers: Sit to/from Stand Sit to Stand: Supervision         General transfer comment: supervision for safety. Slow to rise but no loss of balance  Ambulation/Gait Ambulation/Gait assistance: Supervision Ambulation Distance (Feet): 50 Feet Assistive device: None Gait Pattern/deviations: Step-through pattern;Decreased stride length Gait velocity: decreased Gait velocity  interpretation: Below normal speed for age/gender General Gait Details: VC for forward gaze. Moderately guarded but overall stable without any overt balance loss. Distance limited due to limited activity order at this time. Will increase with MD clearance.  Stairs            Wheelchair Mobility    Modified Rankin (Stroke Patients Only)       Balance Overall balance assessment: Needs assistance Sitting-balance support: No upper extremity supported;Feet supported Sitting balance-Leahy Scale: Normal     Standing balance support: No upper extremity supported Standing balance-Leahy Scale: Good   Single Leg Stance - Right Leg: 30 Single Leg Stance - Left Leg: 5 Tandem Stance - Right Leg: 5 Tandem Stance - Left Leg: 10 Rhomberg - Eyes Opened: 30 Rhomberg - Eyes Closed: 30                 Pertinent Vitals/Pain Pain Assessment: 0-10 Pain Score: 2  Pain Location: head, surgical area Pain Descriptors / Indicators: Throbbing Pain Intervention(s): Monitored during session;Repositioned    Home Living Family/patient expects to be discharged to:: Private residence Living Arrangements: Children Available Help at Discharge: Family;Available 24 hours/day Type of Home: House Home Access: Stairs to enter Entrance Stairs-Rails: None Entrance Stairs-Number of Steps: 3 Home Layout: One level Home Equipment: None      Prior Function Level of Independence: Independent         Comments: walking 1/2 mile/day     Hand Dominance   Dominant Hand: Right    Extremity/Trunk Assessment   Upper Extremity Assessment: Defer to OT evaluation           Lower Extremity  Assessment: Overall WFL for tasks assessed         Communication   Communication: No difficulties  Cognition Arousal/Alertness: Awake/alert Behavior During Therapy: WFL for tasks assessed/performed Overall Cognitive Status: Within Functional Limits for tasks assessed                       General Comments General comments (skin integrity, edema, etc.): Family in room, very supportive. Pt reports he feels near his baseline with mobility prior to the operation on this admission. Eager to return to his daily activites.    Exercises        Assessment/Plan    PT Assessment Patient needs continued PT services  PT Diagnosis Abnormality of gait;Acute pain   PT Problem List Decreased balance;Decreased mobility;Pain  PT Treatment Interventions Gait training;Stair training;Functional mobility training;Therapeutic activities;Therapeutic exercise;Balance training;Neuromuscular re-education;Patient/family education   PT Goals (Current goals can be found in the Care Plan section) Acute Rehab PT Goals Patient Stated Goal: Get back to my walking routine PT Goal Formulation: With patient Time For Goal Achievement: 03/22/15 Potential to Achieve Goals: Good    Frequency Min 4X/week   Barriers to discharge        Co-evaluation               End of Session   Activity Tolerance: Patient tolerated treatment well Patient left: in bed;with call bell/phone within reach;with family/visitor present;with SCD's reapplied Nurse Communication: Mobility status         Time: 7588-3254 PT Time Calculation (min) (ACUTE ONLY): 18 min   Charges:   PT Evaluation $Initial PT Evaluation Tier I: 1 Procedure     PT G CodesEllouise Newer 03/08/2015, 4:24 PM Elayne Snare, [place]

## 2015-03-08 NOTE — Progress Notes (Signed)
Subjective: Patient reports Patient doing well minimal headache no nausea  Objective: Vital signs in last 24 hours: Temp:  [97.5 F (36.4 C)-98.3 F (36.8 C)] 97.5 F (36.4 C) (09/24 0331) Pulse Rate:  [65-102] 75 (09/24 0600) Resp:  [11-22] 15 (09/24 0700) BP: (94-145)/(53-84) 99/79 mmHg (09/24 0700) SpO2:  [96 %-100 %] 99 % (09/24 0600) Weight:  [74.571 kg (164 lb 6.4 oz)] 74.571 kg (164 lb 6.4 oz) (09/23 1107)  Intake/Output from previous day: 09/23 0701 - 09/24 0700 In: 1880 [P.O.:480; I.V.:1300; IV Piggyback:100] Out: 1450 [Urine:1400; Blood:50] Intake/Output this shift:    Awake alert oriented incision clean dry and intact  Lab Results: No results for input(s): WBC, HGB, HCT, PLT in the last 72 hours. BMET No results for input(s): NA, K, CL, CO2, GLUCOSE, BUN, CREATININE, CALCIUM in the last 72 hours.  Studies/Results: No results found.  Assessment/Plan: Doing well postop day 1 from cranioplasty replacement of bone flap from the abdominal wall. Transfer the floor. Continue to work on physical therapy  LOS: 1 day     CRAM,GARY P 03/08/2015, 7:17 AM

## 2015-03-08 NOTE — Op Note (Signed)
Martin Edwards, Martin Edwards NO.:  [id number]  MEDICAL RECORD NO.:  [id number]  LOCATION:  3M05C                        FACILITY:  [facility]  PHYSICIAN:  Leeroy Cha, M.D.   DATE OF BIRTH:  1946/06/13  DATE OF PROCEDURE:  03/07/2015 DATE OF DISCHARGE:                              OPERATIVE REPORT   PREOPERATIVE DIAGNOSIS:  Status post right subdural hematoma, craniotomy.  POSTOPERATIVE DIAGNOSIS:  Status post right subdural hematoma, craniotomy.  PROCEDURE:  Repair of the skull defect using the bone flap which was harvested in the right abdominal wall.  SURGEON:  Leeroy Cha, M.D.  ASSISTANT:  Kary Kos, M.D.  CLINICAL HISTORY:  [name] is a gentleman who underwent surgery for subdural hematoma.  Because of recurrence of the bleeding, we decided to leave the bone flap out that was harvested in the right abdominal wall. Yesterday, he came and he is doing much better.  CT scan showed good postoperative changes.  We talked about repairing the skull defect.  He and the person who was with him knew the risk with the surgery including possibility of infection, hematoma, and need for further surgery.  DESCRIPTION OF PROCEDURE:  The patient was taken to the OR.  After intubation, the head was shaved and prepped with Betadine and DuraPrep later on.  The same procedure was done in the abdominal wall.  Drapes were applied.  We started our incision following the previous one in the abdominal wall through the skin and subcutaneous tissue, and the bone flap was removed from the abdominal wall.  The area was irrigated. Hemostasis was done, and the wound in the abdominal wall was closed with several layers of Vicryl and staples.  Then in the head, we opened the defect following the previous incision.  Hemostasis was done with bipolar.  We were able to dissect around the cranial defect until we had good visualization of the defect.  Then using 2 round plate and 2  flat one, the bone flap was pulled back in place using several screws and the 4 plates as above.  The area looks solid.  The area was irrigated. Hemostasis was done with bipolar, and the scalp was closed using Vicryl and staples.  The patient woke up, awake, talking, moving with no neurological findings.  He is going to be admitted overnight to the intensive care unit because of the history of seizure.          ______________________________ Leeroy Cha, M.D.     EB/MEDQ  D:  03/07/2015  T:  03/07/2015  Job:  [id number]

## 2015-03-09 MED ORDER — DOCUSATE SODIUM 100 MG PO CAPS
100.0000 mg | ORAL_CAPSULE | Freq: Two times a day (BID) | ORAL | Status: DC
  Administered 2015-03-09 – 2015-03-10 (×2): 100 mg via ORAL
  Filled 2015-03-09 (×3): qty 1

## 2015-03-09 NOTE — Progress Notes (Signed)
Physical Therapy Treatment Patient Details Name: Martin Edwards MRN: [id number] DOB: [birth year] Today's Date: 03/09/2015    History of Present Illness 69 y.o. male with history of craniotomy, seen for Repair of the skull defect using the bone flap which was harvested in the abdominal wall.    PT Comments    Continues to make good progress towards functional goals. Tolerated higher level dynamic gait activities today without need of an assistive device. Safely completed stair training without physical assist. Plans to stay with son temporarily at d/c prior to returning home alone. Will follow and progress until discharge.  Follow Up Recommendations  No PT follow up;Supervision - Intermittent     Equipment Recommendations  None recommended by PT    Recommendations for Other Services       Precautions / Restrictions Precautions Precautions: None Restrictions Weight Bearing Restrictions: No    Mobility  Bed Mobility Overal bed mobility: Modified Independent             General bed mobility comments: extra time  Transfers Overall transfer level: Needs assistance Equipment used: None Transfers: Sit to/from Stand Sit to Stand: Supervision         General transfer comment: supervision for safety. Slow to rise but no loss of balance  Ambulation/Gait Ambulation/Gait assistance: Supervision Ambulation Distance (Feet): 250 Feet Assistive device: None Gait Pattern/deviations: Step-through pattern Gait velocity: decreased Gait velocity interpretation: Below normal speed for age/gender General Gait Details: Decreased gait speed. Intermittent cues for forward gaze. Tolerated higher level balance activities with minimal drift from straight line path while ambulating. These tasks included high marching, vertical/horizontal head turns, side stepping, and backwards stepping. No overt loss of balance or need for physical assist with these tasks.   Stairs Stairs: Yes Stairs  assistance: Supervision Stair Management: No rails;Step to pattern;Forwards Number of Stairs: 3 (x3) General stair comments: Educated on safe stair navigation techniques without use of rail similar to home environment. Performed with supervision and no loss of balance.  Wheelchair Mobility    Modified Rankin (Stroke Patients Only)       Balance                                    Cognition Arousal/Alertness: Awake/alert Behavior During Therapy: WFL for tasks assessed/performed Overall Cognitive Status: Within Functional Limits for tasks assessed                      Exercises      General Comments        Pertinent Vitals/Pain Pain Assessment: No/denies pain Pain Intervention(s): Monitored during session    Home Living                      Prior Function            PT Goals (current goals can now be found in the care plan section) Acute Rehab PT Goals Patient Stated Goal: Get back to my walking routine PT Goal Formulation: With patient Time For Goal Achievement: 03/22/15 Potential to Achieve Goals: Good Progress towards PT goals: Progressing toward goals    Frequency  Min 4X/week    PT Plan Current plan remains appropriate    Co-evaluation             End of Session   Activity Tolerance: Patient tolerated treatment well Patient left: with call bell/phone within reach;with SCD's  reapplied;in chair     Time: 9728-2060 PT Time Calculation (min) (ACUTE ONLY): 18 min  Charges:  $Gait Training: 8-22 mins                    G Codes:      Ellouise Newer 2015-04-04, 3:59 PM  Camille Bal [facility], [place]

## 2015-03-09 NOTE — Progress Notes (Signed)
Patient ID: Martin Edwards, male   DOB: [birth year], 69 y.o.   MRN: [id number] Doing well minimal headache  Awake alert oriented strength 5 out of 5 wound clean dry and intact  Mobilized patient get him out of bed physical therapy possible discharge tomorrow.

## 2015-03-10 ENCOUNTER — Encounter (HOSPITAL_COMMUNITY): Payer: Self-pay | Admitting: Neurosurgery

## 2015-03-10 NOTE — Care Management Important Message (Signed)
Important Message  Patient Details  Name: Martin Edwards MRN: [id number] Date of Birth: Mar 01, 1946   Medicare Important Message Given:  Yes-second notification given    Delorse Lek 03/10/2015, 3:15 PM

## 2015-03-10 NOTE — Discharge Summary (Signed)
Physician Discharge Summary  Patient ID: Martin Edwards MRN: [id number] DOB/AGE: 1946-03-16 69 y.o.  Admit date: 03/07/2015 Discharge date: 03/10/2015  Admission Diagnoses:cranial defect  Post op  Discharge Diagnoses:  Active Problems:   History of cranioplasty   Discharged Condition: stable  Hospital Course: surgery Consults: none  Significant Diagnostic Studies: ct head  Treatments:repair cranial defect  Discharge Exam: Blood pressure 117/69, pulse 70, temperature 98.8 F (37.1 C), temperature source Oral, resp. rate 18, height 5\' 10"  (1.778 m), weight 74.571 kg (164 lb 6.4 oz), SpO2 98 %. No changes  Disposition: 01-Home or Self Care     Medication List    ASK your doctor about these medications        amLODipine 10 MG tablet  Commonly known as:  NORVASC  Take 1 tablet (10 mg total) by mouth daily.     atorvastatin 40 MG tablet  Commonly known as:  LIPITOR  Take 1 tablet (40 mg total) by mouth daily.     hydrochlorothiazide 12.5 MG capsule  Commonly known as:  MICROZIDE  Take 1 capsule (12.5 mg total) by mouth daily.     levETIRAcetam 1000 MG tablet  Commonly known as:  KEPPRA  Take 1 tablet (1,000 mg total) by mouth 2 (two) times daily.     lisinopril 40 MG tablet  Commonly known as:  PRINIVIL,ZESTRIL  Take 1 tablet (40 mg total) by mouth daily.     pantoprazole 40 MG tablet  Commonly known as:  PROTONIX  Take 1 tablet (40 mg total) by mouth at bedtime.     phenytoin 100 MG ER capsule  Commonly known as:  DILANTIN  Take 3 capsules (300 mg total) by mouth at bedtime.         Signed: Floyce Stakes 03/10/2015, 9:15 AM

## 2015-03-10 NOTE — Progress Notes (Signed)
Physical Therapy Treatment Patient Details Name: Martin Edwards MRN: [id number] DOB: 1946-06-01 Today's Date: 03/10/2015    History of Present Illness 69 y.o. male with history of craniotomy, seen for Repair of the skull defect using the bone flap which was harvested in the abdominal wall.    PT Comments    Pt moving well and eager for D/C to his son's home.  Pt having mild deficits with following multi step directions and then finding his room despite being able to state his room number.  Feel pt will need family support intermittently at D/C.    Follow Up Recommendations  No PT follow up;Supervision - Intermittent     Equipment Recommendations  None recommended by PT    Recommendations for Other Services       Precautions / Restrictions Precautions Precautions: None Restrictions Weight Bearing Restrictions: No    Mobility  Bed Mobility Overal bed mobility: Modified Independent                Transfers Overall transfer level: Needs assistance Equipment used: None Transfers: Sit to/from Stand Sit to Stand: Supervision         General transfer comment: supervision for safety. Slow to rise but no loss of balance  Ambulation/Gait Ambulation/Gait assistance: Supervision Ambulation Distance (Feet): 300 Feet Assistive device: None Gait Pattern/deviations: Step-through pattern;Decreased stride length     General Gait Details: pt has difficulty correcting forward head posture adn is unsure if he did this prior to surgery.  pt able to perform high level balance activies with minimal difficulties.     Stairs Stairs: Yes Stairs assistance: Supervision Stair Management: One rail Left;Alternating pattern;Forwards Number of Stairs: 11 General stair comments: Demonstrates good use of rail and no deficits.    Wheelchair Mobility    Modified Rankin (Stroke Patients Only)       Balance Overall balance assessment: Needs assistance         Standing balance  support: No upper extremity supported;During functional activity Standing balance-Leahy Scale: Good                      Cognition Arousal/Alertness: Awake/alert Behavior During Therapy: WFL for tasks assessed/performed Overall Cognitive Status: No family/caregiver present to determine baseline cognitive functioning                      Exercises      General Comments        Pertinent Vitals/Pain Pain Assessment: No/denies pain    Home Living                      Prior Function            PT Goals (current goals can now be found in the care plan section) Acute Rehab PT Goals Patient Stated Goal: Get back to my walking routine PT Goal Formulation: With patient Time For Goal Achievement: 03/22/15 Potential to Achieve Goals: Good Progress towards PT goals: Progressing toward goals    Frequency  Min 4X/week    PT Plan Current plan remains appropriate    Co-evaluation             End of Session Equipment Utilized During Treatment: Gait belt Activity Tolerance: Patient tolerated treatment well Patient left: in bed;with call bell/phone within reach;with bed alarm set     Time: 0932-3557 PT Time Calculation (min) (ACUTE ONLY): 14 min  Charges:  $Gait Training: 8-22 mins  G CodesCatarina Hartshorn, [place] 03/10/2015, 9:19 AM

## 2015-03-18 ENCOUNTER — Encounter: Payer: Self-pay | Admitting: Emergency Medicine

## 2015-04-09 ENCOUNTER — Other Ambulatory Visit: Payer: Self-pay | Admitting: Emergency Medicine

## 2015-04-09 ENCOUNTER — Other Ambulatory Visit: Payer: Self-pay | Admitting: Neurology

## 2015-05-12 ENCOUNTER — Ambulatory Visit: Payer: Medicare Other | Admitting: Adult Health

## 2015-05-13 ENCOUNTER — Ambulatory Visit (INDEPENDENT_AMBULATORY_CARE_PROVIDER_SITE_OTHER): Payer: Medicare Other | Admitting: Adult Health

## 2015-05-13 ENCOUNTER — Encounter: Payer: Self-pay | Admitting: Adult Health

## 2015-05-13 VITALS — BP 142/84 | HR 98 | Ht 70.0 in | Wt 168.5 lb

## 2015-05-13 DIAGNOSIS — Z79899 Other long term (current) drug therapy: Secondary | ICD-10-CM | POA: Diagnosis not present

## 2015-05-13 DIAGNOSIS — Z5181 Encounter for therapeutic drug level monitoring: Secondary | ICD-10-CM

## 2015-05-13 DIAGNOSIS — R569 Unspecified convulsions: Secondary | ICD-10-CM | POA: Diagnosis not present

## 2015-05-13 DIAGNOSIS — Z8679 Personal history of other diseases of the circulatory system: Secondary | ICD-10-CM

## 2015-05-13 NOTE — Patient Instructions (Addendum)
Continue Dilantin and Keppra. We will check blood work today We will continue to monitor memory. Ok to drive after you get an eye exam. If your symptoms worsen or you develop new symptoms please let us know.

## 2015-05-13 NOTE — Progress Notes (Signed)
PATIENT: OSMOND CEDOTAL DOB: Oct 27, 1945  REASON FOR VISIT: follow up- Seizures HISTORY FROM: patient  HISTORY OF PRESENT ILLNESS: Mr. Gueli  Is a 69 year old male with a history of subdural hematoma occurring after a fall And subsequent seizures. He returns today for follow-up visit. He is currently on Dilantin and Keppra. He denies any additional seizure events. He continues to live with his daughter in [place]. He is able to complete all ADLs independently. He does not operate a motor vehicle at this time. Patient reports that his cranial flap has been replaced.  He reports that he has a follow-up visit with Dr. Joya Salm  December 5.  Son reports that he wears his reading glasses all day long. He states that he's tried to encourage his father to get an eye exam.He denies any new neurological symptoms. He returns today for a follow-up visit.  HISTORY 01/08/15 (WILLIS): Mr. Fronheiser is a 69 year old right-handed white male with a history of a fall that occurred on 11/11/2014. The patient went to the emergency room that evening as he was having severe headaches. He was found to have a large right subdural hematoma that required a craniotomy. 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 The patient was treated with Dilantin and Keppra, and the seizures came under control. The patient was seen by Dr. Joya Salm, and he is getting serial CT scan evaluations of the brain. The patient has a cranial flap in his abdomen, this has not yet been replaced. The patient is not operating a motor vehicle. He is living with his daughter currently in the [place], [place] area. The patient normally lives alone in the [place], [place] area. The patient is on Dilantin and Keppra currently, he is tolerating medications well. The last Dilantin level was in June 2016. He denies any  headaches, vision changes, or residual numbness or weakness of the face, arms, and legs. He does have some mild short-term memory issues and concentration changes since the fall. He comes to this office for an evaluation  REVIEW OF SYSTEMS: Out of a complete 14 system review of symptoms, the patient complains only of the following symptoms, and all other reviewed systems are negative.   incontinence of bladder  ALLERGIES: Allergies  Allergen Reactions  . Other Rash    Needs Hypoallergenic Sheets/ Laundry Cart had questionable allergic reaction to detergent last time he was here.    HOME MEDICATIONS: Outpatient Prescriptions Prior to Visit  Medication Sig Dispense Refill  . amLODipine (NORVASC) 10 MG tablet Take 1 tablet (10 mg total) by mouth daily. 90 tablet 3  . atorvastatin (LIPITOR) 40 MG tablet Take 1 tablet (40 mg total) by mouth daily. 90 tablet 3  . hydrochlorothiazide (MICROZIDE) 12.5 MG capsule Take 1 capsule (12.5 mg total) by mouth daily. 90 capsule 3  . levETIRAcetam (KEPPRA) 1000 MG tablet Take 1 tablet by mouth two  times daily 180 tablet 0  . lisinopril (PRINIVIL,ZESTRIL) 40 MG tablet Take 1 tablet by mouth  daily 90 tablet 0  . pantoprazole (PROTONIX) 40 MG tablet Take 1 tablet (40 mg total) by mouth at bedtime. 90 tablet 3  . phenytoin (DILANTIN) 100 MG ER capsule Take 3 capsules by mouth at bedtime 270 capsule 0   No facility-administered medications prior to visit.    PAST MEDICAL HISTORY: Past Medical History  Diagnosis Date  . Hypertension   .  Hyperlipidemia   . Prostate cancer ([place]) 1999  . Basal cell carcinoma of face X 2  . Small bowel obstruction due to adhesions ([place]) 01/30/2014  . History of kidney stones   . Subdural hematoma ([facility])   . Seizures ([place])     PAST SURGICAL HISTORY: Past Surgical History  Procedure Laterality Date  . Prostatectomy  05/20/1998  . Colectomy  10/2010    "took out 8 inches of colon; wasn't cancer"  . Basal cell carcinoma  excision  X 2    "off my face both times"  . Craniotomy Right 11/11/2014    Procedure: CRANIOTOMY HEMATOMA EVACUATION SUBDURAL;  Surgeon: Leeroy Cha, MD;  Location: [facility];  Service: Neurosurgery;  Laterality: Right;  . Craniotomy Right 11/13/2014    Procedure: Removal bone flap, insertion into the abdominal wall;  Surgeon: Leeroy Cha, MD;  Location: [facility];  Service: Neurosurgery;  Laterality: Right;  . Craniotomy N/A 03/07/2015    Procedure: Cranioplasty using Bone flap in abdomen;  Surgeon: Leeroy Cha, MD;  Location: [facility];  Service: Neurosurgery;  Laterality: N/A;  Cranioplasty using Bone flap in abdomen    FAMILY HISTORY: Family History  Problem Relation Age of Onset  . Cancer Father     Lung    SOCIAL HISTORY: Social History   Social History  . Marital Status: Divorced    Spouse Name: N/A  . Number of Children: 2  . Years of Education: 12   Occupational History  . retired    Social History Main Topics  . Smoking status: Never Smoker   . Smokeless tobacco: Never Used  . Alcohol Use: No     Comment: 01/30/2014 "maybe a beer twice/yr"  . Drug Use: No  . Sexual Activity: Not Currently   Other Topics Concern  . Not on file   Social History Narrative   Exercise: 2-3 times a wk      Patient drinks 2 cups of caffeine daily.   Patient is right handed.      PHYSICAL EXAM  Filed Vitals:   05/13/15 1047  BP: 142/84  Pulse: 98  Height: 5\' 10"  (1.778 m)  Weight: 168 lb 8 oz (76.431 kg)   Body mass index is 24.18 kg/(m^2).  Generalized: Well developed, in no acute distress   Neurological examination  Mentation: Alert oriented to time, place, history taking. Follows all commands speech and language fluent.  MMSE 28/30 Cranial nerve II-XII: Pupils were equal round reactive to light. Extraocular movements were full, visual field were full on confrontational test. Facial sensation and strength were normal. Uvula tongue midline. Head turning  and shoulder shrug  were normal and symmetric. Motor: The motor testing reveals 5 over 5 strength of all 4 extremities. Good symmetric motor tone is noted throughout.  Sensory: Sensory testing is intact to soft touch on all 4 extremities. No evidence of extinction is noted.  Coordination: Cerebellar testing reveals good finger-nose-finger and heel-to-shin bilaterally.  Gait and station: Gait is normal. Tandem gait is normal. Romberg is negative. No drift is seen.  Reflexes: Deep tendon reflexes are symmetric and normal bilaterally.   DIAGNOSTIC DATA (LABS, IMAGING, TESTING) - I reviewed patient records, labs, notes, testing and imaging myself where available.  Lab Results  Component Value Date   WBC 5.0 03/03/2015   HGB 15.0 03/03/2015   HCT 43.2 03/03/2015   MCV 89.6 03/03/2015   PLT 192 03/03/2015      Component Value Date/Time   NA  139 03/03/2015 1409   K 3.6 03/03/2015 1409   CL 101 03/03/2015 1409   CO2 29 03/03/2015 1409   GLUCOSE 162* 03/03/2015 1409   BUN 7 03/03/2015 1409   CREATININE 1.00 03/03/2015 1409   CREATININE 0.74 12/17/2014 1037   CALCIUM 9.8 03/03/2015 1409   PROT 6.9 02/13/2015 1537   ALBUMIN 4.0 02/13/2015 1537   AST 29 02/13/2015 1537   ALT 33 02/13/2015 1537   ALKPHOS 99 02/13/2015 1537   BILITOT 0.4 02/13/2015 1537   GFRNONAA >60 03/03/2015 1409   GFRNONAA >89 12/17/2014 1037   GFRAA >60 03/03/2015 1409   GFRAA >89 12/17/2014 1037   Lab Results  Component Value Date   CHOL 159 12/17/2014   HDL 53 12/17/2014   LDLCALC 86 12/17/2014   TRIG 101 12/17/2014   CHOLHDL 3.0 12/17/2014   Lab Results  Component Value Date   HGBA1C 5.5 12/12/2012   No results found for: VITAMINB12 Lab Results  Component Value Date   TSH 0.886 02/13/2015      ASSESSMENT AND PLAN 69 y.o. year old male  has a past medical history of Hypertension; Hyperlipidemia; Prostate cancer ([place]) (1999); Basal cell carcinoma of face (X 2); Small bowel obstruction due to  adhesions ([facility]) (01/30/2014); History of kidney stones; Subdural hematoma ([place]); and Seizures ([place]). here with:   1. Seizures  2. History of subdural hematoma   Overall the patient is doing well. He will continue on Dilantin and Keppra. I will check blood work today. Patient has been seizure-free for 6 months. From our standpoint he is okay to drive however I have encouraged him to get an eye exam first for clearance from his surgeon Dr. Joya Salm.  Patient advised that if he has any seizure events he should let us know. He will follow-up in 6 months or sooner if needed.  Ward Givens, MSN, NP-C 05/13/2015, 11:00 AM [facility] [address], [place], [place] [address] [phone]  \

## 2015-05-13 NOTE — Progress Notes (Signed)
I have read the note, and I agree with the clinical assessment and plan.  WILLIS,CHARLES KEITH   

## 2015-05-14 LAB — CBC WITH DIFFERENTIAL/PLATELET
BASOS ABS: 0 10*3/uL (ref 0.0–0.2)
Basos: 0 %
EOS (ABSOLUTE): 0 10*3/uL (ref 0.0–0.4)
Eos: 0 %
HEMOGLOBIN: 14.8 g/dL (ref 12.6–17.7)
Hematocrit: 43 % (ref 37.5–51.0)
IMMATURE GRANS (ABS): 0 10*3/uL (ref 0.0–0.1)
Immature Granulocytes: 0 %
LYMPHS: 25 %
Lymphocytes Absolute: 1.4 10*3/uL (ref 0.7–3.1)
MCH: 31.4 pg (ref 26.6–33.0)
MCHC: 34.4 g/dL (ref 31.5–35.7)
MCV: 91 fL (ref 79–97)
MONOCYTES: 9 %
Monocytes Absolute: 0.5 10*3/uL (ref 0.1–0.9)
NEUTROS ABS: 3.6 10*3/uL (ref 1.4–7.0)
Neutrophils: 66 %
PLATELETS: 225 10*3/uL (ref 150–379)
RBC: 4.71 x10E6/uL (ref 4.14–5.80)
RDW: 15.1 % (ref 12.3–15.4)
WBC: 5.5 10*3/uL (ref 3.4–10.8)

## 2015-05-14 LAB — COMPREHENSIVE METABOLIC PANEL
ALT: 47 IU/L — AB (ref 0–44)
AST: 25 IU/L (ref 0–40)
Albumin/Globulin Ratio: 1.7 (ref 1.1–2.5)
Albumin: 4.5 g/dL (ref 3.6–4.8)
Alkaline Phosphatase: 112 IU/L (ref 39–117)
BILIRUBIN TOTAL: 0.3 mg/dL (ref 0.0–1.2)
BUN/Creatinine Ratio: 8 — ABNORMAL LOW (ref 10–22)
BUN: 8 mg/dL (ref 8–27)
CALCIUM: 9.6 mg/dL (ref 8.6–10.2)
CHLORIDE: 97 mmol/L (ref 97–106)
CO2: 24 mmol/L (ref 18–29)
Creatinine, Ser: 1 mg/dL (ref 0.76–1.27)
GFR calc non Af Amer: 76 mL/min/{1.73_m2} (ref >59)
GFR, EST AFRICAN AMERICAN: 88 mL/min/{1.73_m2} (ref >59)
GLUCOSE: 107 mg/dL — AB (ref 65–99)
Globulin, Total: 2.6 g/dL (ref 1.5–4.5)
Potassium: 4.5 mmol/L (ref 3.5–5.2)
Sodium: 138 mmol/L (ref 136–144)
TOTAL PROTEIN: 7.1 g/dL (ref 6.0–8.5)

## 2015-05-14 LAB — PHENYTOIN LEVEL, TOTAL: PHENYTOIN (DILANTIN), SERUM: 6.5 ug/mL — AB (ref 10.0–20.0)

## 2015-05-15 ENCOUNTER — Telehealth: Payer: Self-pay

## 2015-05-15 NOTE — Telephone Encounter (Signed)
-----   Message from Ward Givens, NP sent at 05/14/2015  9:28 AM EST ----- Lab work is unremarkable. Please call the patient.

## 2015-05-15 NOTE — Telephone Encounter (Signed)
Spoke to daughter [place]. Went over results. Daughter verbalized understanding. Daughter request office encourage dad to have eye exam. Advised would speak with NP-Megan to set up. Daughter was grateful.

## 2015-05-15 NOTE — Telephone Encounter (Signed)
Spoke to patient. Gave lab results. Advised will contact ophthalmologist office for him. Patient was very grateful.

## 2015-05-15 NOTE — Telephone Encounter (Signed)
Called Dr. Zenia Resides office and scheduled appt for patient on  05/30/15 @ 10AM. Called daughter and informed. Daughter was very grateful.

## 2015-05-30 DIAGNOSIS — H2513 Age-related nuclear cataract, bilateral: Secondary | ICD-10-CM | POA: Diagnosis not present

## 2015-05-30 DIAGNOSIS — H11043 Peripheral pterygium, stationary, bilateral: Secondary | ICD-10-CM | POA: Diagnosis not present

## 2015-05-30 DIAGNOSIS — H40013 Open angle with borderline findings, low risk, bilateral: Secondary | ICD-10-CM | POA: Diagnosis not present

## 2015-06-24 ENCOUNTER — Telehealth: Payer: Self-pay | Admitting: Adult Health

## 2015-06-24 ENCOUNTER — Telehealth: Payer: Self-pay | Admitting: Neurology

## 2015-06-24 NOTE — Telephone Encounter (Signed)
Pt's daughter called said pt is having some slight numbness bil hands, but hands and fingertips will turn white intermittently since November 2016. This happens when he touches something cold or is outside in the cold. Once the extremity is warmed it returns to natural color. The episodes are happening more frequently. She is inquiring if this could be side effect of seizure medication.

## 2015-06-24 NOTE — Telephone Encounter (Addendum)
Spoke to daughter. Gave advice per NP-Megan Millikan's previous note.Daughter agreed.

## 2015-06-24 NOTE — Telephone Encounter (Signed)
Tried to reach daughter. No answer.  

## 2015-06-24 NOTE — Telephone Encounter (Signed)
Returned call. No answer.  

## 2015-07-02 NOTE — Telephone Encounter (Signed)
errror

## 2015-08-06 ENCOUNTER — Other Ambulatory Visit: Payer: Self-pay | Admitting: Neurology

## 2015-08-06 ENCOUNTER — Other Ambulatory Visit: Payer: Self-pay | Admitting: Emergency Medicine

## 2015-11-01 ENCOUNTER — Other Ambulatory Visit: Payer: Self-pay | Admitting: Emergency Medicine

## 2015-11-11 ENCOUNTER — Ambulatory Visit (INDEPENDENT_AMBULATORY_CARE_PROVIDER_SITE_OTHER): Payer: Medicare Other | Admitting: Adult Health

## 2015-11-11 ENCOUNTER — Encounter: Payer: Self-pay | Admitting: Adult Health

## 2015-11-11 VITALS — BP 134/60 | HR 80 | Resp 16 | Ht 70.0 in | Wt 165.0 lb

## 2015-11-11 DIAGNOSIS — Z8679 Personal history of other diseases of the circulatory system: Secondary | ICD-10-CM

## 2015-11-11 DIAGNOSIS — Z5181 Encounter for therapeutic drug level monitoring: Secondary | ICD-10-CM | POA: Diagnosis not present

## 2015-11-11 DIAGNOSIS — R569 Unspecified convulsions: Secondary | ICD-10-CM | POA: Diagnosis not present

## 2015-11-11 MED ORDER — LEVETIRACETAM 1000 MG PO TABS: ORAL_TABLET | ORAL | Status: DC

## 2015-11-11 MED ORDER — PHENYTOIN SODIUM EXTENDED 100 MG PO CAPS: 300.0000 mg | ORAL_CAPSULE | Freq: Every day | ORAL | Status: DC

## 2015-11-11 NOTE — Patient Instructions (Signed)
Continue Keppra and Dilantin Blood work today If you have any seizures let us know

## 2015-11-11 NOTE — Progress Notes (Signed)
PATIENT: Martin Edwards DOB: 09/05/1945  REASON FOR VISIT: follow up- subdural hematoma, subsequent seizures HISTORY FROM: patient  HISTORY OF PRESENT ILLNESS: Martin Edwards is a 70 year old male with a history of subdural hematoma after a fall and subsequent seizures. He returns today for follow-up. He remains on Dilantin and Keppra. He reports that he has not had any additional seizure events. He is now driving. He denies any difficulty with driving. He is able to complete all ADLs independently. The patient is questioning whether he can reduce his medications. He states that he has been tolerating the medication well. He returns today for an evaluation   05/13/15 (MM): Martin Edwards Is a 69 year old male with a history of subdural hematoma occurring after a fall And subsequent seizures. He returns today for follow-up visit. He is currently on Dilantin and Keppra. He denies any additional seizure events. He continues to live with his daughter in [place]. He is able to complete all ADLs independently. He does not operate a motor vehicle at this time. Patient reports that his cranial flap has been replaced. He reports that he has a follow-up visit with Dr. Joya Salm December 5. Son reports that he wears his reading glasses all day long. He states that he's tried to encourage his father to get an eye exam.He denies any new neurological symptoms. He returns today for a follow-up visit.  HISTORY 01/08/15 (WILLIS): Martin Edwards is a 70 year old right-handed white male with a history of a fall that occurred on 11/11/2014. The patient went to the emergency room that evening as he was having severe headaches. He was found to have a large right subdural hematoma that required a craniotomy. 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 The patient was treated with Dilantin and  Keppra, and the seizures came under control. The patient was seen by Dr. Joya Salm, and he is getting serial CT scan evaluations of the brain. The patient has a cranial flap in his abdomen, this has not yet been replaced. The patient is not operating a motor vehicle. He is living with his daughter currently in the [place], [place] area. The patient normally lives alone in the [place], [place] area. The patient is on Dilantin and Keppra currently, he is tolerating medications well. The last Dilantin level was in June 2016. He denies any headaches, vision changes, or residual numbness or weakness of the face, arms, and legs. He does have some mild short-term memory issues and concentration changes since the fall. He comes to this office for an evaluation  REVIEW OF SYSTEMS: Out of a complete 14 system review of symptoms, the patient complains only of the following symptoms, and all other reviewed systems are negative.  See history of present illness  ALLERGIES: Allergies  Allergen Reactions  . Other Rash    Needs Hypoallergenic Sheets/ Laundry Cart had questionable allergic reaction to detergent last time he was here.    HOME MEDICATIONS: Outpatient Prescriptions Prior to Visit  Medication Sig Dispense Refill  . amLODipine (NORVASC) 10 MG tablet Take 1 tablet by mouth  daily 90 tablet 0  . atorvastatin (LIPITOR) 40 MG tablet Take 1 tablet by mouth  daily 90 tablet 0  . hydrochlorothiazide (MICROZIDE) 12.5 MG capsule Take 1 capsule by mouth  daily 90 capsule 0  . lisinopril (PRINIVIL,ZESTRIL) 40 MG tablet Take 1 tablet by mouth  daily 90  tablet 0  . pantoprazole (PROTONIX) 40 MG tablet Take 1 tablet by mouth at  bedtime 90 tablet 0  . levETIRAcetam (KEPPRA) 1000 MG tablet Take 1 tablet by mouth two  times daily 180 tablet 1  . phenytoin (DILANTIN) 100 MG ER capsule Take 3 capsules by mouth at bedtime 270 capsule 1   No facility-administered medications prior to visit.    PAST  MEDICAL HISTORY: Past Medical History  Diagnosis Date  . Hypertension   . Hyperlipidemia   . Prostate cancer ([place]) 1999  . Basal cell carcinoma of face X 2  . Small bowel obstruction due to adhesions ([facility]) 01/30/2014  . History of kidney stones   . Subdural hematoma ([place])   . Seizures ([place])     PAST SURGICAL HISTORY: Past Surgical History  Procedure Laterality Date  . Prostatectomy  05/20/1998  . Colectomy  10/2010    "took out 8 inches of colon; wasn't cancer"  . Basal cell carcinoma excision  X 2    "off my face both times"  . Craniotomy Right 11/11/2014    Procedure: CRANIOTOMY HEMATOMA EVACUATION SUBDURAL;  Surgeon: Leeroy Cha, MD;  Location: [facility];  Service: Neurosurgery;  Laterality: Right;  . Craniotomy Right 11/13/2014    Procedure: Removal bone flap, insertion into the abdominal wall;  Surgeon: Leeroy Cha, MD;  Location: [facility];  Service: Neurosurgery;  Laterality: Right;  . Craniotomy N/A 03/07/2015    Procedure: Cranioplasty using Bone flap in abdomen;  Surgeon: Leeroy Cha, MD;  Location: [facility];  Service: Neurosurgery;  Laterality: N/A;  Cranioplasty using Bone flap in abdomen    FAMILY HISTORY: Family History  Problem Relation Age of Onset  . Cancer Father     Lung    SOCIAL HISTORY: Social History   Social History  . Marital Status: Divorced    Spouse Name: N/A  . Number of Children: 2  . Years of Education: 12   Occupational History  . retired    Social History Main Topics  . Smoking status: Never Smoker   . Smokeless tobacco: Never Used  . Alcohol Use: No     Comment: 01/30/2014 "maybe a beer twice/yr"  . Drug Use: No  . Sexual Activity: Not Currently   Other Topics Concern  . Not on file   Social History Narrative   Exercise: 2-3 times a wk      Patient drinks 2 cups of caffeine daily.   Patient is right handed.      PHYSICAL EXAM  Filed Vitals:   11/11/15 1534  BP: 134/60  Pulse: 80  Resp: 16  Height:  5\' 10"  (1.778 m)  Weight: 165 lb (74.844 kg)   Body mass index is 23.68 kg/(m^2).  Generalized: Well developed, in no acute distress   Neurological examination  Mentation: Alert oriented to time, place, history taking. Follows all commands speech and language fluent Cranial nerve II-XII: Pupils were equal round reactive to light. Extraocular movements were full, visual field were full on confrontational test. Facial sensation and strength were normal. Uvula tongue midline. Head turning and shoulder shrug  were normal and symmetric. Motor: The motor testing reveals 5 over 5 strength of all 4 extremities. Good symmetric motor tone is noted throughout.  Sensory: Sensory testing is intact to soft touch on all 4 extremities. No evidence of extinction is noted.  Coordination: Cerebellar testing reveals good finger-nose-finger and heel-to-shin bilaterally.  Gait and station: Gait is normal. Tandem gait is  normal. Romberg is negative. No drift is seen.  Reflexes: Deep tendon reflexes are symmetric and normal bilaterally.   DIAGNOSTIC DATA (LABS, IMAGING, TESTING) - I reviewed patient records, labs, notes, testing and imaging myself where available.  Lab Results  Component Value Date   WBC 5.5 05/13/2015   HGB 15.0 03/03/2015   HCT 43.0 05/13/2015   MCV 91 05/13/2015   PLT 225 05/13/2015      Component Value Date/Time   NA 138 05/13/2015 1144   NA 139 03/03/2015 1409   K 4.5 05/13/2015 1144   CL 97 05/13/2015 1144   CO2 24 05/13/2015 1144   GLUCOSE 107* 05/13/2015 1144   GLUCOSE 162* 03/03/2015 1409   BUN 8 05/13/2015 1144   BUN 7 03/03/2015 1409   CREATININE 1.00 05/13/2015 1144   CREATININE 0.74 12/17/2014 1037   CALCIUM 9.6 05/13/2015 1144   PROT 7.1 05/13/2015 1144   PROT 6.9 02/13/2015 1537   ALBUMIN 4.5 05/13/2015 1144   ALBUMIN 4.0 02/13/2015 1537   AST 25 05/13/2015 1144   ALT 47* 05/13/2015 1144   ALKPHOS 112 05/13/2015 1144   BILITOT 0.3 05/13/2015 1144   BILITOT 0.4  02/13/2015 1537   GFRNONAA 76 05/13/2015 1144   GFRNONAA >89 12/17/2014 1037   GFRAA 88 05/13/2015 1144   GFRAA >89 12/17/2014 1037   Lab Results  Component Value Date   CHOL 159 12/17/2014   HDL 53 12/17/2014   LDLCALC 86 12/17/2014   TRIG 101 12/17/2014   CHOLHDL 3.0 12/17/2014        ASSESSMENT AND PLAN 70 y.o. year old male  has a past medical history of Hypertension; Hyperlipidemia; Prostate cancer ([place]) (1999); Basal cell carcinoma of face (X 2); Small bowel obstruction due to adhesions ([place]) (01/30/2014); History of kidney stones; Subdural hematoma ([place]); and Seizures ([place]). here with:  1. Seizures  Overall the patient is doing well. He will continue on Dilantin and Keppra. I will check blood work today. Pending blood work we may consider decreasing his medication. I advised that changes in his dosing may result in a seizure. Patient voiced understanding. He will follow-up in 6 months with Dr. Jannifer Franklin.     Ward Givens, MSN, NP-C 11/11/2015, 4:04 PM [facility] [address], [place], [place] [address] [phone]

## 2015-11-11 NOTE — Progress Notes (Signed)
I have read the note, and I agree with the clinical assessment and plan.  WILLIS,CHARLES KEITH   

## 2015-11-12 ENCOUNTER — Telehealth: Payer: Self-pay

## 2015-11-12 LAB — COMPREHENSIVE METABOLIC PANEL
ALBUMIN: 4.4 g/dL (ref 3.5–4.8)
ALT: 42 IU/L (ref 0–44)
AST: 31 IU/L (ref 0–40)
Albumin/Globulin Ratio: 1.4 (ref 1.2–2.2)
Alkaline Phosphatase: 115 IU/L (ref 39–117)
BUN/Creatinine Ratio: 11 (ref 10–24)
BUN: 11 mg/dL (ref 8–27)
Bilirubin Total: <0.2 mg/dL (ref 0.0–1.2)
CALCIUM: 9.5 mg/dL (ref 8.6–10.2)
CO2: 25 mmol/L (ref 18–29)
Chloride: 95 mmol/L — ABNORMAL LOW (ref 96–106)
Creatinine, Ser: 0.98 mg/dL (ref 0.76–1.27)
GFR, EST AFRICAN AMERICAN: 90 mL/min/{1.73_m2} (ref >59)
GFR, EST NON AFRICAN AMERICAN: 78 mL/min/{1.73_m2} (ref >59)
Globulin, Total: 3.1 g/dL (ref 1.5–4.5)
Glucose: 98 mg/dL (ref 65–99)
POTASSIUM: 4.5 mmol/L (ref 3.5–5.2)
Sodium: 140 mmol/L (ref 134–144)
TOTAL PROTEIN: 7.5 g/dL (ref 6.0–8.5)

## 2015-11-12 LAB — CBC WITH DIFFERENTIAL/PLATELET
BASOS: 0 %
Basophils Absolute: 0 10*3/uL (ref 0.0–0.2)
EOS (ABSOLUTE): 0 10*3/uL (ref 0.0–0.4)
Eos: 0 %
HEMOGLOBIN: 14.6 g/dL (ref 12.6–17.7)
Hematocrit: 43.9 % (ref 37.5–51.0)
IMMATURE GRANS (ABS): 0 10*3/uL (ref 0.0–0.1)
Immature Granulocytes: 0 %
LYMPHS: 31 %
Lymphocytes Absolute: 1.6 10*3/uL (ref 0.7–3.1)
MCH: 30.9 pg (ref 26.6–33.0)
MCHC: 33.3 g/dL (ref 31.5–35.7)
MCV: 93 fL (ref 79–97)
MONOCYTES: 10 %
Monocytes Absolute: 0.5 10*3/uL (ref 0.1–0.9)
NEUTROS ABS: 3.1 10*3/uL (ref 1.4–7.0)
Neutrophils: 59 %
Platelets: 204 10*3/uL (ref 150–379)
RBC: 4.73 x10E6/uL (ref 4.14–5.80)
RDW: 14.5 % (ref 12.3–15.4)
WBC: 5.3 10*3/uL (ref 3.4–10.8)

## 2015-11-12 LAB — PHENYTOIN LEVEL, TOTAL: Phenytoin (Dilantin), Serum: 6.2 ug/mL — ABNORMAL LOW (ref 10.0–20.0)

## 2015-11-12 NOTE — Telephone Encounter (Signed)
-----   Message from Ward Givens, NP sent at 11/12/2015  8:25 AM EDT ----- Blood work is ok. Please call patient.

## 2015-11-12 NOTE — Telephone Encounter (Signed)
LM that labs are ok and no changes need to be made at this time. No specifics given. Left call back number for further questions.

## 2016-01-08 ENCOUNTER — Ambulatory Visit (INDEPENDENT_AMBULATORY_CARE_PROVIDER_SITE_OTHER): Payer: Medicare Other | Admitting: Emergency Medicine

## 2016-01-08 ENCOUNTER — Encounter: Payer: Self-pay | Admitting: Emergency Medicine

## 2016-01-08 VITALS — BP 124/70 | HR 85 | Temp 98.9°F | Resp 16 | Ht 70.0 in | Wt 164.0 lb

## 2016-01-08 DIAGNOSIS — Z1159 Encounter for screening for other viral diseases: Secondary | ICD-10-CM | POA: Diagnosis not present

## 2016-01-08 DIAGNOSIS — I7300 Raynaud's syndrome without gangrene: Secondary | ICD-10-CM

## 2016-01-08 DIAGNOSIS — E785 Hyperlipidemia, unspecified: Secondary | ICD-10-CM | POA: Diagnosis not present

## 2016-01-08 DIAGNOSIS — I6200 Nontraumatic subdural hemorrhage, unspecified: Secondary | ICD-10-CM | POA: Diagnosis not present

## 2016-01-08 DIAGNOSIS — L570 Actinic keratosis: Secondary | ICD-10-CM | POA: Diagnosis not present

## 2016-01-08 DIAGNOSIS — K219 Gastro-esophageal reflux disease without esophagitis: Secondary | ICD-10-CM

## 2016-01-08 DIAGNOSIS — Z0000 Encounter for general adult medical examination without abnormal findings: Secondary | ICD-10-CM

## 2016-01-08 DIAGNOSIS — R569 Unspecified convulsions: Secondary | ICD-10-CM | POA: Diagnosis not present

## 2016-01-08 DIAGNOSIS — C61 Malignant neoplasm of prostate: Secondary | ICD-10-CM | POA: Diagnosis not present

## 2016-01-08 DIAGNOSIS — S065X9A Traumatic subdural hemorrhage with loss of consciousness of unspecified duration, initial encounter: Secondary | ICD-10-CM

## 2016-01-08 DIAGNOSIS — S065XAA Traumatic subdural hemorrhage with loss of consciousness status unknown, initial encounter: Secondary | ICD-10-CM

## 2016-01-08 DIAGNOSIS — I10 Essential (primary) hypertension: Secondary | ICD-10-CM

## 2016-01-08 LAB — COMPLETE METABOLIC PANEL WITH GFR
ALT: 45 U/L (ref 9–46)
AST: 25 U/L (ref 10–35)
Albumin: 4.5 g/dL (ref 3.6–5.1)
Alkaline Phosphatase: 92 U/L (ref 40–115)
BUN: 10 mg/dL (ref 7–25)
CHLORIDE: 102 mmol/L (ref 98–110)
CO2: 29 mmol/L (ref 20–31)
CREATININE: 0.97 mg/dL (ref 0.70–1.18)
Calcium: 9.5 mg/dL (ref 8.6–10.3)
GFR, Est African American: >89 mL/min (ref >=60)
GFR, Est Non African American: 79 mL/min (ref >=60)
Glucose, Bld: 107 mg/dL — ABNORMAL HIGH (ref 65–99)
POTASSIUM: 4.3 mmol/L (ref 3.5–5.3)
SODIUM: 139 mmol/L (ref 135–146)
Total Bilirubin: 0.4 mg/dL (ref 0.2–1.2)
Total Protein: 7.5 g/dL (ref 6.1–8.1)

## 2016-01-08 LAB — POC MICROSCOPIC URINALYSIS (UMFC)

## 2016-01-08 LAB — POCT URINALYSIS DIP (MANUAL ENTRY)
BILIRUBIN UA: NEGATIVE
BILIRUBIN UA: NEGATIVE
Glucose, UA: NEGATIVE
Leukocytes, UA: NEGATIVE
NITRITE UA: NEGATIVE
PH UA: 6
RBC UA: NEGATIVE
Spec Grav, UA: 1.015
Urobilinogen, UA: 0.2

## 2016-01-08 LAB — LIPID PANEL
CHOL/HDL RATIO: 2.5 ratio (ref <=5.0)
Cholesterol: 160 mg/dL (ref 125–200)
HDL: 64 mg/dL (ref >=40)
LDL Cholesterol: 80 mg/dL (ref <130)
TRIGLYCERIDES: 78 mg/dL (ref <150)
VLDL: 16 mg/dL (ref <30)

## 2016-01-08 LAB — POCT CBC
Granulocyte percent: 68.5 %G (ref 37–80)
HCT, POC: 40.8 % — AB (ref 43.5–53.7)
HEMOGLOBIN: 14.7 g/dL (ref 14.1–18.1)
Lymph, poc: 1.4 (ref 0.6–3.4)
MCH: 32.9 pg — AB (ref 27–31.2)
MCHC: 36 g/dL — AB (ref 31.8–35.4)
MCV: 91.4 fL (ref 80–97)
MID (cbc): 0.2 (ref 0–0.9)
MPV: 9.2 fL (ref 0–99.8)
POC Granulocyte: 3.6 (ref 2–6.9)
POC LYMPH PERCENT: 26.9 %L (ref 10–50)
POC MID %: 4.6 % (ref 0–12)
Platelet Count, POC: 160 10*3/uL (ref 142–424)
RBC: 4.46 M/uL — AB (ref 4.69–6.13)
RDW, POC: 13.2 %
WBC: 5.2 10*3/uL (ref 4.6–10.2)

## 2016-01-08 LAB — HEPATITIS C ANTIBODY: HCV AB: NEGATIVE

## 2016-01-08 LAB — TSH: TSH: 1.49 m[IU]/L (ref 0.40–4.50)

## 2016-01-08 MED ORDER — AMLODIPINE BESYLATE 10 MG PO TABS: 10.0000 mg | ORAL_TABLET | Freq: Every day | ORAL | 3 refills | Status: DC

## 2016-01-08 MED ORDER — HYDROCHLOROTHIAZIDE 12.5 MG PO CAPS: 12.5000 mg | ORAL_CAPSULE | Freq: Every day | ORAL | 3 refills | Status: DC

## 2016-01-08 MED ORDER — ATORVASTATIN CALCIUM 40 MG PO TABS: 40.0000 mg | ORAL_TABLET | Freq: Every day | ORAL | 3 refills | Status: DC

## 2016-01-08 MED ORDER — LISINOPRIL 40 MG PO TABS: 40.0000 mg | ORAL_TABLET | Freq: Every day | ORAL | 3 refills | Status: DC

## 2016-01-08 NOTE — Progress Notes (Signed)
By signing my name below, I, Moises Blood, attest that this documentation has been prepared under the direction and in the presence of Arlyss Queen, MD. Electronically Signed: Moises Blood, [place]. 01/08/2016 , 9:54 AM .  Patient was seen in room 1 .  Chief Complaint:  Chief Complaint  Patient presents with  . Annual Exam    HPI: Martin Edwards is a 70 y.o. male who h/o subdural hematoma reports to [facility] today for an annual physical.  He had a history of a fall on 11/11/14 while he was showering. He was brought into the hospital and CT head showed a large right subdural hematoma.   Patient was seen at cardiologist office recently but was informed that a stress test wasn't ordered for him, so it wasn't done that day. He's still taking Keppra and dilantin. He denies smoking or drinking.   He's followed by neurologist Ward Givens, NP. He denies having dermatologist or cardiologist.   He has history of prostatectomy. His last colonoscopy was done last year, 09/25/14.   Past Medical History:  Diagnosis Date  . Basal cell carcinoma of face X 2  . History of kidney stones   . Hyperlipidemia   . Hypertension   . Prostate cancer ([place]) 1999  . Seizures ([place])   . Small bowel obstruction due to adhesions ([place]) 01/30/2014  . Subdural hematoma [facility])    Past Surgical History:  Procedure Laterality Date  . BASAL CELL CARCINOMA EXCISION  X 2   "off my face both times"  . COLECTOMY  10/2010   "took out 8 inches of colon; wasn't cancer"  . CRANIOTOMY Right 11/11/2014   Procedure: CRANIOTOMY HEMATOMA EVACUATION SUBDURAL;  Surgeon: Leeroy Cha, MD;  Location: [place] [facility];  Service: Neurosurgery;  Laterality: Right;  . CRANIOTOMY Right 11/13/2014   Procedure: Removal bone flap, insertion into the abdominal wall;  Surgeon: Leeroy Cha, MD;  Location: [place] [facility] OR[facility];  Service: Neurosurgery;  Laterality: Right;  . CRANIOTOMY N/A 03/07/2015   Procedure: Cranioplasty using Bone flap in  abdomen;  Surgeon: Leeroy Cha, MD;  Location: [place] [facility] OR[facility];  Service: Neurosurgery;  Laterality: N/A;  Cranioplasty using Bone flap in abdomen  . PROSTATECTOMY  05/20/1998   Social History   Social History  . Marital status: Divorced    Spouse name: N/A  . Number of children: 2  . Years of education: 12   Occupational History  . retired    Social History Main Topics  . Smoking status: Never Smoker  . Smokeless tobacco: Never Used  . Alcohol use No     Comment: 01/30/2014 "maybe a beer twice/yr"  . Drug use: No  . Sexual activity: Not Currently   Other Topics Concern  . None   Social History Narrative   Exercise: 2-3 times a wk      Patient drinks 2 cups of caffeine daily.   Patient is right handed.   Family History  Problem Relation Age of Onset  . Cancer Father     Lung   Allergies  Allergen Reactions  . Other Rash    Needs Hypoallergenic Sheets/ Laundry Cart had questionable allergic reaction to detergent last time he was here.   Prior to Admission medications   Medication Sig Start Date End Date Taking? Authorizing Provider  amLODipine (NORVASC) 10 MG tablet Take 1 tablet by mouth  daily 11/01/15  Yes Darlyne Russian, MD  atorvastatin (LIPITOR) 40 MG tablet Take 1 tablet by mouth  daily 11/01/15  Yes Darlyne Russian, MD  hydrochlorothiazide (MICROZIDE) 12.5 MG capsule Take 1 capsule by mouth  daily 11/01/15  Yes Darlyne Russian, MD  levETIRAcetam (KEPPRA) 1000 MG tablet Take 1 tablet by mouth two  times daily 11/11/15  Yes Ward Givens, NP  lisinopril (PRINIVIL,ZESTRIL) 40 MG tablet Take 1 tablet by mouth  daily 11/01/15  Yes Darlyne Russian, MD  phenytoin (DILANTIN) 100 MG ER capsule Take 3 capsules (300 mg total) by mouth at bedtime. 11/11/15  Yes Ward Givens, NP  pantoprazole (PROTONIX) 40 MG tablet Take 1 tablet by mouth at  bedtime Patient not taking: Reported on 01/08/2016 11/01/15   Darlyne Russian, MD     ROS:  Constitutional: negative for fever, chills, night  sweats, weight changes, or fatigue  HEENT: negative for vision changes, hearing loss, congestion, rhinorrhea, ST, epistaxis, or sinus pressure Cardiovascular: negative for chest pain or palpitations Respiratory: negative for hemoptysis, wheezing, shortness of breath, or cough Abdominal: negative for abdominal pain, nausea, vomiting, diarrhea, or constipation Dermatological: negative for rash Neurologic: negative for headache, dizziness, or syncope All other systems reviewed and are otherwise negative with the exception to those above and in the HPI.  PHYSICAL EXAM: Vitals:   01/08/16 0917  BP: 124/70  Pulse: 85  Resp: 16  Temp: 98.9 F (37.2 C)   Body mass index is 23.53 kg/m.   General: Alert, no acute distress HEENT:  Normocephalic, atraumatic, oropharynx patent; Right craniectomy scar with skull defect Eye: [facility], [facility] Cardiovascular:  Regular rate and rhythm, no rubs murmurs or gallops.  No Carotid bruits, radial pulse intact. No pedal edema.  Respiratory: Clear to auscultation bilaterally.  No wheezes, rales, or rhonchi.  No cyanosis, no use of accessory musculature Abdominal: Well healed surgical scar over his right upper quadrant from bone flap surgery, No organomegaly, abdomen is soft and non-tender, positive bowel sounds. No masses. Musculoskeletal: Gait intact. No edema, tenderness; Absent left great toe - due to lawnmower accident when he was in the 6th grade Skin: No rashes. Neurologic: Facial musculature symmetric. Psychiatric: Patient acts appropriately throughout our interaction.  Lymphatic: No cervical or submandibular lymphadenopathy Genitourinary/Anorectal: Surgically removed prostate with no rectal masses  LABS: Results for orders placed or performed in visit on 01/08/16  POCT CBC  Result Value Ref Range   WBC 5.2 4.6 - 10.2 K/uL   Lymph, poc 1.4 0.6 - 3.4   POC LYMPH PERCENT 26.9 10 - 50 %L   MID (cbc) 0.2 0 - 0.9   POC MID % 4.6 0 - 12 %M   POC  Granulocyte 3.6 2 - 6.9   Granulocyte percent 68.5 37 - 80 %G   RBC 4.46 (A) 4.69 - 6.13 M/uL   Hemoglobin 14.7 14.1 - 18.1 g/dL   HCT, POC 40.8 (A) 43.5 - 53.7 %   MCV 91.4 80 - 97 fL   MCH, POC 32.9 (A) 27 - 31.2 pg   MCHC 36.0 (A) 31.8 - 35.4 g/dL   RDW, POC 13.2 %   Platelet Count, POC 160 142 - 424 K/uL   MPV 9.2 0 - 99.8 fL  POCT urinalysis dipstick  Result Value Ref Range   Color, UA yellow yellow   Clarity, UA clear clear   Glucose, UA negative negative   Bilirubin, UA negative negative   Ketones, POC UA negative negative   Spec Grav, UA 1.015    Blood, UA negative negative   pH, UA 6.0  Protein Ur, POC trace (A) negative   Urobilinogen, UA 0.2    Nitrite, UA Negative Negative   Leukocytes, UA Negative Negative  POCT Microscopic Urinalysis ([facility])  Result Value Ref Range   WBC,UR,HPF,POC Few (A) None WBC/hpf   RBC,UR,HPF,POC None None RBC/hpf   Bacteria Moderate (A) None, Too numerous to count   Mucus Present (A) Absent   Epithelial Cells, UR Per Microscopy Few (A) None, Too numerous to count cells/hpf     EKG/XRAY:     ASSESSMENT/PLAN: Patient looks great labs were done. Referral made back to cardio as he did not have his work up [place]. All meds were refilled.I personally performed the services described in this documentation, which was scribed in my presence. The recorded information has been reviewed and is accurate.  Gross sideeffects, risk and benefits, and alternatives of medications d/w patient. Patient is aware that all medications have potential sideeffects and we are unable to predict every sideeffect or drug-drug interaction that may occur.  Arlyss Queen MD 01/08/2016 9:28 AM

## 2016-01-08 NOTE — Patient Instructions (Addendum)
   IF you received an x-ray today, you will receive an invoice from [facility]. Please contact [facility] at [phone] with questions or concerns regarding your invoice.   IF you received labwork today, you will receive an invoice from [facility]. Please contact [facility] at [phone] with questions or concerns regarding your invoice.   Our billing staff will not be able to assist you with questions regarding bills from these companies.  You will be contacted with the lab results as soon as they are available. The fastest way to get your results is to activate your My Chart account. Instructions are located on the last page of this paperwork. If you have not heard from us regarding the results in 2 weeks, please contact this office.    Health Maintenance, Male A healthy lifestyle and preventative care can promote health and wellness.  Maintain regular health, dental, and eye exams.  Eat a healthy diet. Foods like vegetables, fruits, whole grains, low-fat dairy products, and lean protein foods contain the nutrients you need and are low in calories. Decrease your intake of foods high in solid fats, added sugars, and salt. Get information about a proper diet from your health care provider, if necessary.  Regular physical exercise is one of the most important things you can do for your health. Most adults should get at least 150 minutes of moderate-intensity exercise (any activity that increases your heart rate and causes you to sweat) each week. In addition, most adults need muscle-strengthening exercises on 2 or more days a week.   Maintain a healthy weight. The body mass index (BMI) is a screening tool to identify possible weight problems. It provides an estimate of body fat based on height and weight. Your health care provider can find your BMI and can help you achieve or maintain a healthy weight. For males 20 years and older:  A BMI  below 18.5 is considered underweight.  A BMI of 18.5 to 24.9 is normal.  A BMI of 25 to 29.9 is considered overweight.  A BMI of 30 and above is considered obese.  Maintain normal blood lipids and cholesterol by exercising and minimizing your intake of saturated fat. Eat a balanced diet with plenty of fruits and vegetables. Blood tests for lipids and cholesterol should begin at age 20 and be repeated every 5 years. If your lipid or cholesterol levels are high, you are over age 50, or you are at high risk for heart disease, you may need your cholesterol levels checked more frequently.Ongoing high lipid and cholesterol levels should be treated with medicines if diet and exercise are not working.  If you smoke, find out from your health care provider how to quit. If you do not use tobacco, do not start.  Lung cancer screening is recommended for adults aged 55-80 years who are at high risk for developing lung cancer because of a history of smoking. A yearly low-dose CT scan of the lungs is recommended for people who have at least a 30-pack-year history of smoking and are current smokers or have quit within the past 15 years. A pack year of smoking is smoking an average of 1 pack of cigarettes a day for 1 year (for example, a 30-pack-year history of smoking could mean smoking 1 pack a day for 30 years or 2 packs a day for 15 years). Yearly screening should continue until the smoker has stopped smoking for at least 15 years. Yearly screening should be stopped   for people who develop a health problem that would prevent them from having lung cancer treatment.  If you choose to drink alcohol, do not have more than 2 drinks per day. One drink is considered to be 12 oz (360 mL) of beer, 5 oz (150 mL) of wine, or 1.5 oz (45 mL) of liquor.  Avoid the use of street drugs. Do not share needles with anyone. Ask for help if you need support or instructions about stopping the use of drugs.  High blood pressure  causes heart disease and increases the risk of stroke. High blood pressure is more likely to develop in:  People who have blood pressure in the end of the normal range (100-139/85-89 mm Hg).  People who are overweight or obese.  People who are African American.  If you are 18-39 years of age, have your blood pressure checked every 3-5 years. If you are 40 years of age or older, have your blood pressure checked every year. You should have your blood pressure measured twice--once when you are at a hospital or clinic, and once when you are not at a hospital or clinic. Record the average of the two measurements. To check your blood pressure when you are not at a hospital or clinic, you can use:  An automated blood pressure machine at a pharmacy.  A home blood pressure monitor.  If you are 45-79 years old, ask your health care provider if you should take aspirin to prevent heart disease.  Diabetes screening involves taking a blood sample to check your fasting blood sugar level. This should be done once every 3 years after age 45 if you are at a normal weight and without risk factors for diabetes. Testing should be considered at a younger age or be carried out more frequently if you are overweight and have at least 1 risk factor for diabetes.  Colorectal cancer can be detected and often prevented. Most routine colorectal cancer screening begins at the age of 50 and continues through age 75. However, your health care provider may recommend screening at an earlier age if you have risk factors for colon cancer. On a yearly basis, your health care provider may provide home test kits to check for hidden blood in the stool. A small camera at the end of a tube may be used to directly examine the colon (sigmoidoscopy or colonoscopy) to detect the earliest forms of colorectal cancer. Talk to your health care provider about this at age 50 when routine screening begins. A direct exam of the colon should be repeated  every 5-10 years through age 75, unless early forms of precancerous polyps or small growths are found.  People who are at an increased risk for hepatitis B should be screened for this virus. You are considered at high risk for hepatitis B if:  You were born in a country where hepatitis B occurs often. Talk with your health care provider about which countries are considered high risk.  Your parents were born in a high-risk country and you have not received a shot to protect against hepatitis B (hepatitis B vaccine).  You have HIV or AIDS.  You use needles to inject street drugs.  You live with, or have sex with, someone who has hepatitis B.  You are a man who has sex with other men (MSM).  You get hemodialysis treatment.  You take certain medicines for conditions like cancer, organ transplantation, and autoimmune conditions.  Hepatitis C blood testing is recommended for   all people born from 1945 through 1965 and any individual with known risk factors for hepatitis C.  Healthy men should no longer receive prostate-specific antigen (PSA) blood tests as part of routine cancer screening. Talk to your health care provider about prostate cancer screening.  Testicular cancer screening is not recommended for adolescents or adult males who have no symptoms. Screening includes self-exam, a health care provider exam, and other screening tests. Consult with your health care provider about any symptoms you have or any concerns you have about testicular cancer.  Practice safe sex. Use condoms and avoid high-risk sexual practices to reduce the spread of sexually transmitted infections (STIs).  You should be screened for STIs, including gonorrhea and chlamydia if:  You are sexually active and are younger than 24 years.  You are older than 24 years, and your health care provider tells you that you are at risk for this type of infection.  Your sexual activity has changed since you were last screened,  and you are at an increased risk for chlamydia or gonorrhea. Ask your health care provider if you are at risk.  If you are at risk of being infected with HIV, it is recommended that you take a prescription medicine daily to prevent HIV infection. This is called pre-exposure prophylaxis (PrEP). You are considered at risk if:  You are a man who has sex with other men (MSM).  You are a heterosexual man who is sexually active with multiple partners.  You take drugs by injection.  You are sexually active with a partner who has HIV.  Talk with your health care provider about whether you are at high risk of being infected with HIV. If you choose to begin PrEP, you should first be tested for HIV. You should then be tested every 3 months for as long as you are taking PrEP.  Use sunscreen. Apply sunscreen liberally and repeatedly throughout the day. You should seek shade when your shadow is shorter than you. Protect yourself by wearing long sleeves, pants, a wide-brimmed hat, and sunglasses year round whenever you are outdoors.  Tell your health care provider of new moles or changes in moles, especially if there is a change in shape or color. Also, tell your health care provider if a mole is larger than the size of a pencil eraser.  A one-time screening for abdominal aortic aneurysm (AAA) and surgical repair of large AAAs by ultrasound is recommended for men aged 65-75 years who are current or former smokers.  Stay current with your vaccines (immunizations).   This information is not intended to replace advice given to you by your health care provider. Make sure you discuss any questions you have with your health care provider.   Document Released: 11/27/2007 Document Revised: 06/21/2014 Document Reviewed: 10/26/2010 Elsevier Interactive Patient Education 2016 Elsevier Inc.  

## 2016-01-09 LAB — PSA, MEDICARE: PSA: 0.26 ng/mL (ref <=4.00)

## 2016-02-11 ENCOUNTER — Other Ambulatory Visit: Payer: Self-pay | Admitting: General Surgery

## 2016-02-11 DIAGNOSIS — C4361 Malignant melanoma of right upper limb, including shoulder: Secondary | ICD-10-CM | POA: Diagnosis not present

## 2016-02-11 DIAGNOSIS — L570 Actinic keratosis: Secondary | ICD-10-CM | POA: Diagnosis not present

## 2016-02-11 DIAGNOSIS — Z85828 Personal history of other malignant neoplasm of skin: Secondary | ICD-10-CM | POA: Diagnosis not present

## 2016-02-11 DIAGNOSIS — D225 Melanocytic nevi of trunk: Secondary | ICD-10-CM | POA: Diagnosis not present

## 2016-02-11 DIAGNOSIS — L821 Other seborrheic keratosis: Secondary | ICD-10-CM | POA: Diagnosis not present

## 2016-02-11 DIAGNOSIS — D485 Neoplasm of uncertain behavior of skin: Secondary | ICD-10-CM | POA: Diagnosis not present

## 2016-02-11 DIAGNOSIS — L720 Epidermal cyst: Secondary | ICD-10-CM | POA: Diagnosis not present

## 2016-02-11 DIAGNOSIS — D1801 Hemangioma of skin and subcutaneous tissue: Secondary | ICD-10-CM | POA: Diagnosis not present

## 2016-02-11 DIAGNOSIS — C44529 Squamous cell carcinoma of skin of other part of trunk: Secondary | ICD-10-CM | POA: Diagnosis not present

## 2016-02-25 DIAGNOSIS — C4361 Malignant melanoma of right upper limb, including shoulder: Secondary | ICD-10-CM | POA: Diagnosis not present

## 2016-02-25 DIAGNOSIS — C4360 Malignant melanoma of unspecified upper limb, including shoulder: Secondary | ICD-10-CM | POA: Diagnosis not present

## 2016-02-26 ENCOUNTER — Other Ambulatory Visit (HOSPITAL_COMMUNITY): Payer: Self-pay | Admitting: General Surgery

## 2016-02-26 DIAGNOSIS — C4361 Malignant melanoma of right upper limb, including shoulder: Secondary | ICD-10-CM

## 2016-03-08 ENCOUNTER — Ambulatory Visit (HOSPITAL_COMMUNITY)
Admission: RE | Admit: 2016-03-08 | Discharge: 2016-03-08 | Disposition: A | Discharge status: Home / Self Care | Payer: Medicare Other | Source: Ambulatory Visit | Attending: General Surgery | Admitting: General Surgery

## 2016-03-08 DIAGNOSIS — C439 Malignant melanoma of skin, unspecified: Secondary | ICD-10-CM | POA: Diagnosis not present

## 2016-03-08 DIAGNOSIS — C4361 Malignant melanoma of right upper limb, including shoulder: Secondary | ICD-10-CM | POA: Insufficient documentation

## 2016-03-08 MED ORDER — TECHNETIUM TC 99M SULFUR COLLOID FILTERED
0.5000 | Freq: Once | INTRAVENOUS | Status: AC | PRN
  Administered 2016-03-08: 0.5 via INTRADERMAL

## 2016-03-15 ENCOUNTER — Ambulatory Visit: Payer: Self-pay | Admitting: General Surgery

## 2016-03-15 DIAGNOSIS — C4361 Malignant melanoma of right upper limb, including shoulder: Secondary | ICD-10-CM

## 2016-03-26 DIAGNOSIS — C434 Malignant melanoma of scalp and neck: Secondary | ICD-10-CM | POA: Insufficient documentation

## 2016-04-06 ENCOUNTER — Telehealth: Payer: Self-pay | Admitting: *Deleted

## 2016-04-06 NOTE — Telephone Encounter (Signed)
Received fax that phenytoin EX caps to change from generic AMNEAL to TARO.  Pt has approved this change.   See form to your inbox for signature.

## 2016-04-07 ENCOUNTER — Encounter (HOSPITAL_COMMUNITY): Payer: Self-pay

## 2016-04-07 ENCOUNTER — Encounter (HOSPITAL_COMMUNITY)
Admission: RE | Admit: 2016-04-07 | Discharge: 2016-04-07 | Disposition: A | Discharge status: Home / Self Care | Payer: Medicare Other | Source: Ambulatory Visit | Attending: General Surgery | Admitting: General Surgery

## 2016-04-07 DIAGNOSIS — Z01812 Encounter for preprocedural laboratory examination: Secondary | ICD-10-CM | POA: Diagnosis not present

## 2016-04-07 HISTORY — DX: Gastro-esophageal reflux disease without esophagitis: K21.9

## 2016-04-07 LAB — BASIC METABOLIC PANEL
Anion gap: 10 (ref 5–15)
BUN: 8 mg/dL (ref 6–20)
CO2: 26 mmol/L (ref 22–32)
CREATININE: 0.95 mg/dL (ref 0.61–1.24)
Calcium: 9.5 mg/dL (ref 8.9–10.3)
Chloride: 101 mmol/L (ref 101–111)
GFR calc Af Amer: >60 mL/min (ref >60)
Glucose, Bld: 133 mg/dL — ABNORMAL HIGH (ref 65–99)
POTASSIUM: 3.5 mmol/L (ref 3.5–5.1)
SODIUM: 137 mmol/L (ref 135–145)

## 2016-04-07 LAB — CBC
HCT: 45.8 % (ref 39.0–52.0)
Hemoglobin: 15.5 g/dL (ref 13.0–17.0)
MCH: 31.4 pg (ref 26.0–34.0)
MCHC: 33.8 g/dL (ref 30.0–36.0)
MCV: 92.7 fL (ref 78.0–100.0)
PLATELETS: 210 10*3/uL (ref 150–400)
RBC: 4.94 MIL/uL (ref 4.22–5.81)
RDW: 12.7 % (ref 11.5–15.5)
WBC: 6.1 10*3/uL (ref 4.0–10.5)

## 2016-04-07 NOTE — Pre-Procedure Instructions (Addendum)
Martin Edwards  04/07/2016      [facility] - [place], [place] [address] Phone: [phone] Fax: [phone]  [place], [place] [address] [place] [address] Phone: [phone] Fax: [phone]  [id number] [address], [place] [place] [facility] [place] [address] Phone: [phone] Fax: [phone]  [facility] [address] - [place], [place] [address] Phone: [phone] Fax: [phone]  [facility] [id number] [facility] - [place], [place] [address] Phone: [phone] Fax: [phone]  [id number] - [place], [place] [address] Phone: [phone] Fax: [phone]    Your procedure is scheduled on 04/14/16  Report to [facility] at 800 A.M.  Call this number if you have problems the morning of surgery:  [phone]   Remember:  Do not eat food or drink liquids after midnight.  Take these medicines the morning of surgery with A SIP Of WATER amlodipine(norvasc),keppra,protonix(pantprazole)  STOP all herbel meds, nsaids (aleve,naproxen,advil,ibuprofen) 7 days prior to surgery(TODAY) including aspirin, all vitamins   Do not wear jewelry, make-up or nail polish.  Do not wear lotions, powders, or perfumes, or deoderant.  Do not shave 48 hours prior to surgery.  Men may shave face and neck.  Do not bring valuables to the hospital.  [facility] is not responsible for any belongings or valuables.  Contacts, dentures or bridgework may not be worn into surgery.  Leave your suitcase in the car.  After surgery it may be brought to your room.  For patients admitted to the  hospital, discharge time will be determined by your treatment team.  Patients discharged the day of surgery will not be allowed to drive home.   Name and phone number of your driver:   Special instructions:   Special Instructions:  - Preparing for Surgery  Before surgery, you can play an important role.  Because skin is not sterile, your skin needs to be as free of germs as possible.  You can reduce the number of germs on you skin by washing with CHG (chlorahexidine gluconate) soap before surgery.  CHG is an antiseptic cleaner which kills germs and bonds with the skin to continue killing germs even after washing.  Please DO NOT use if you have an allergy to CHG or antibacterial soaps.  If your skin becomes reddened/irritated stop using the CHG and inform your nurse when you arrive at Short Stay.  Do not shave (including legs and underarms) for at least 48 hours prior to the first CHG shower.  You may shave your face.  Please follow these instructions carefully:   1.  Shower with CHG Soap the night before surgery and the morning of Surgery.  2.  If you choose to wash your hair, wash your hair first as usual with your normal shampoo.  3.  After you shampoo, rinse your hair and body thoroughly to remove the Shampoo.  4.  Use CHG as you would any other liquid soap.  You can apply chg directly  to the skin and wash gently with scrungie or a clean washcloth.  5.  Apply the CHG Soap to your body ONLY FROM THE  NECK DOWN.  Do not use on open wounds or open sores.  Avoid contact with your eyes ears, mouth and genitals (private parts).  Wash genitals (private parts)       with your normal soap.  6.  Wash thoroughly, paying special attention to the area where your surgery will be performed.  7.  Thoroughly rinse your body with warm water from the neck down.  8.  DO NOT shower/wash with your normal soap after using and rinsing off the CHG Soap.  9.  Pat yourself dry with a clean towel.             10.  Wear clean pajamas.            11.  Place clean sheets on your bed the night of your first shower and do not sleep with pets.  Day of Surgery  Do not apply any lotions/deodorants the morning of surgery.  Please wear clean clothes to the hospital/surgery center.  Please read over the  fact sheets that you were given.

## 2016-04-10 IMAGING — CR DG CHEST 1V PORT
1 series · 1 of 1 positions shown · non-contrast
Comparison: 01/30/2014

CLINICAL DATA: Fever

EXAM:
PORTABLE CHEST - 1 VIEW

[AP]
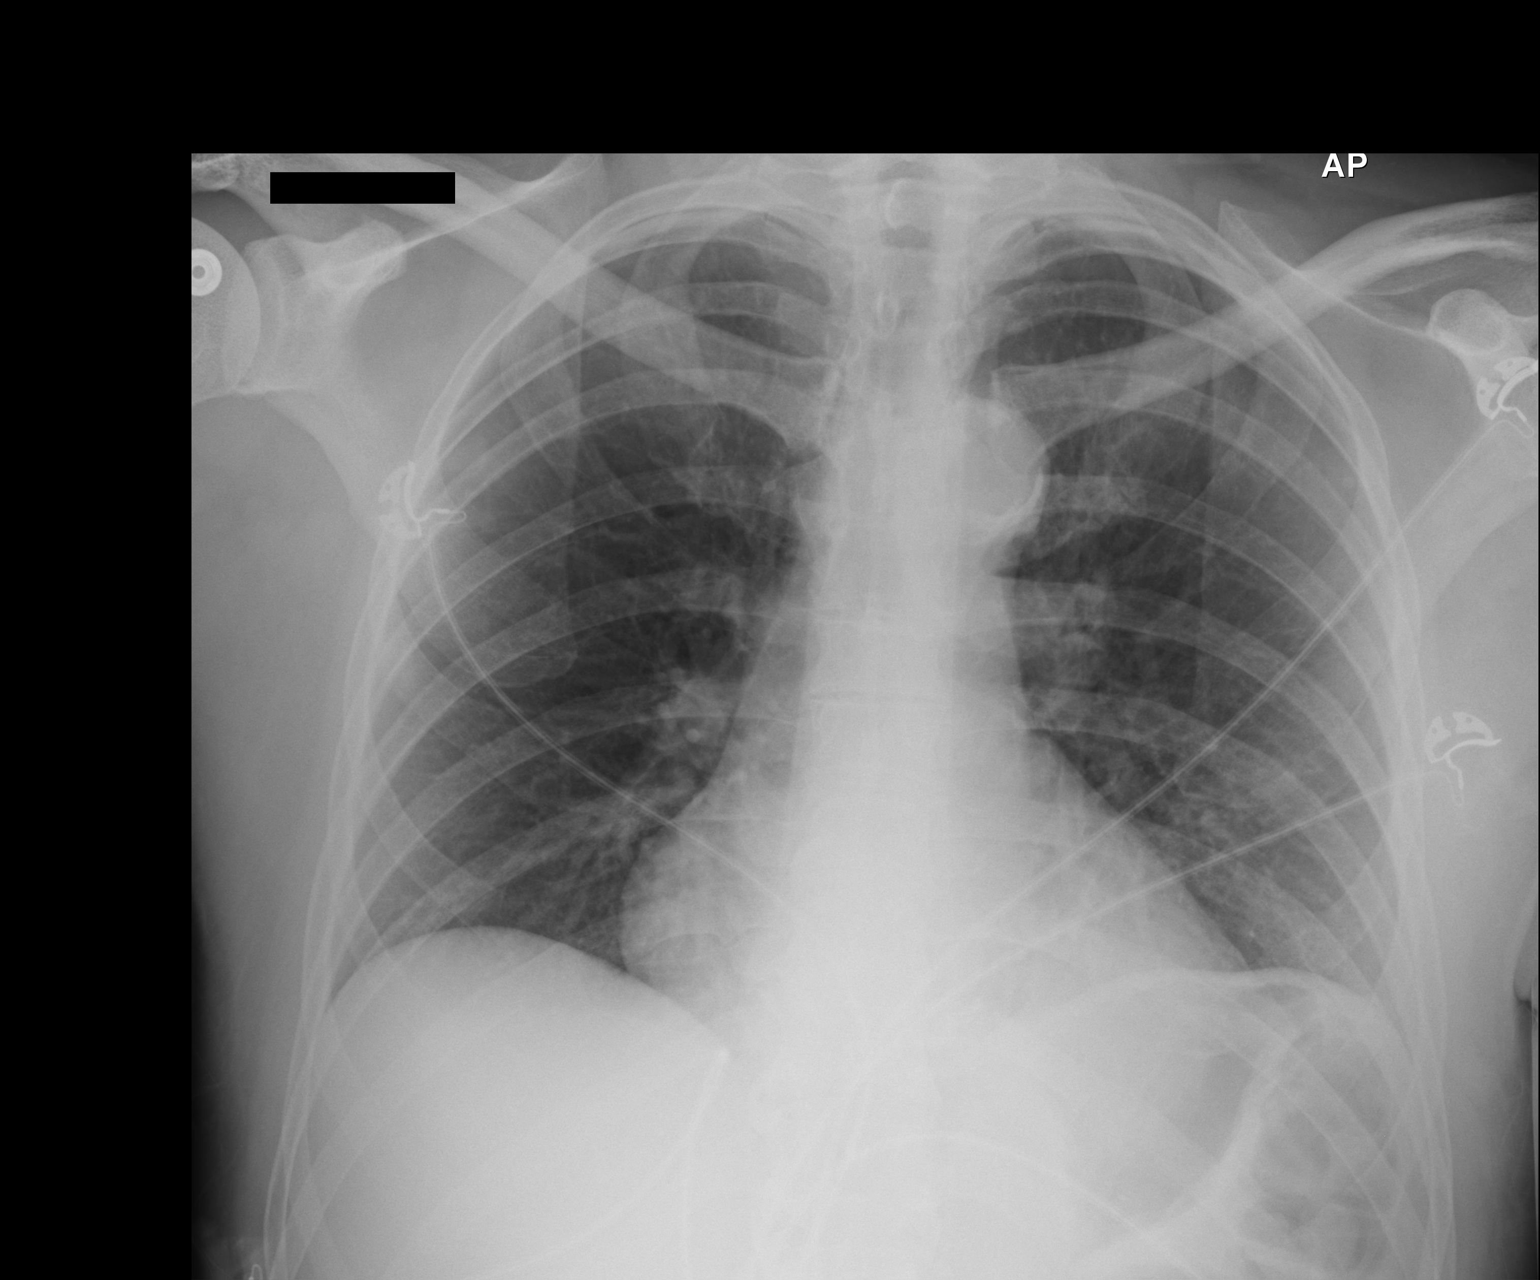

[1 of 1 positions shown; findings below may reference images not displayed]

FINDINGS: Cardiomediastinal silhouette is stable. No acute infiltrate or
pleural effusion. No pulmonary edema. Bony thorax is unremarkable.
IMPRESSION: No active disease.

## 2016-04-11 IMAGING — CT CT HEAD W/O CM
2 series · 14 of 30 positions shown, 18 images · non-contrast
Comparison: Prior CT from 11/13/2014

CLINICAL DATA: Follow-up examination for subdural hematoma status
post evacuation.

EXAM:
CT HEAD WITHOUT CONTRAST
TECHNIQUE: Contiguous axial images were obtained from the base of the skull
through the vertex without intravenous contrast.

[Series 201: head w/o, idose (1) · axial · non-contrast · 0.45mm/px · z∈[+74,+209]mm · 13 of 33 slices shown, 17 images]
[im 3/33  brain]
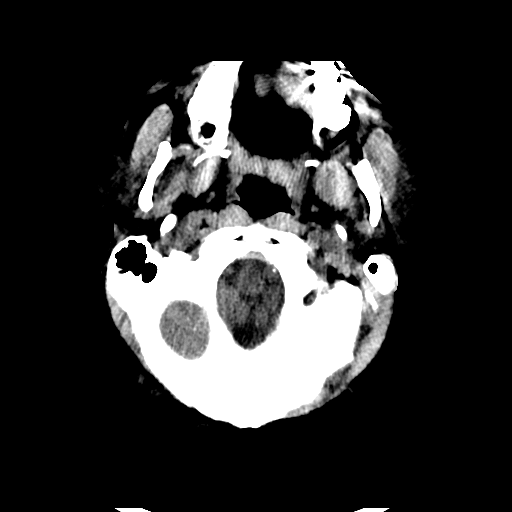
[im 3/33  bone]
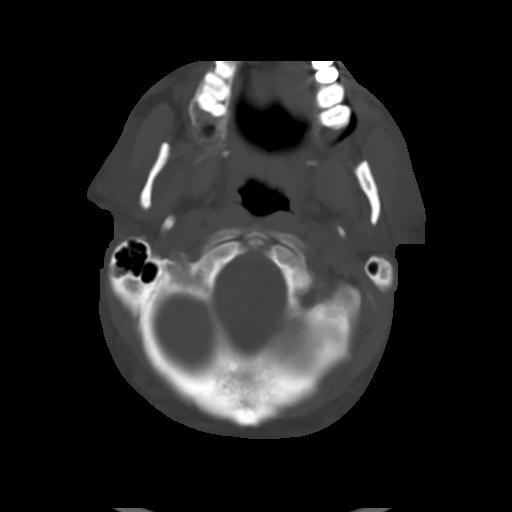
[im 5/33  brain]
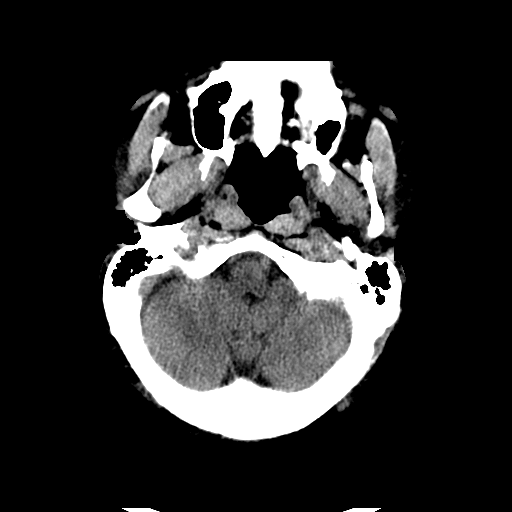
[im 7/33  brain]
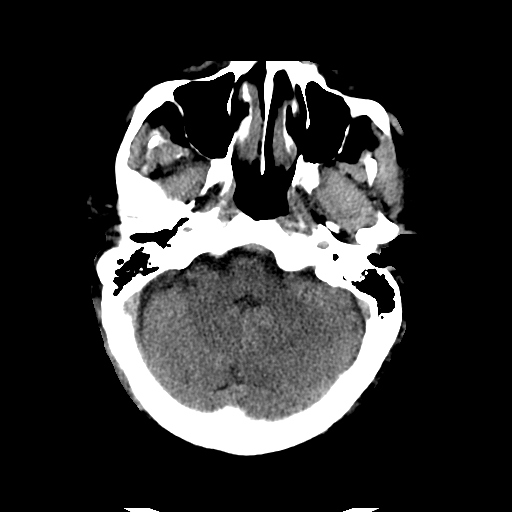
[im 10/33  brain]
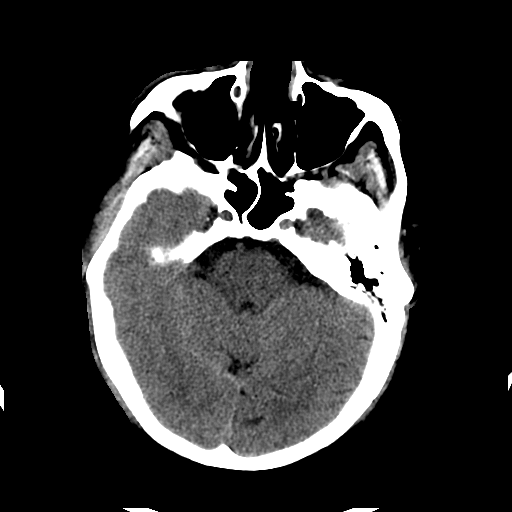
[im 12/33  brain]
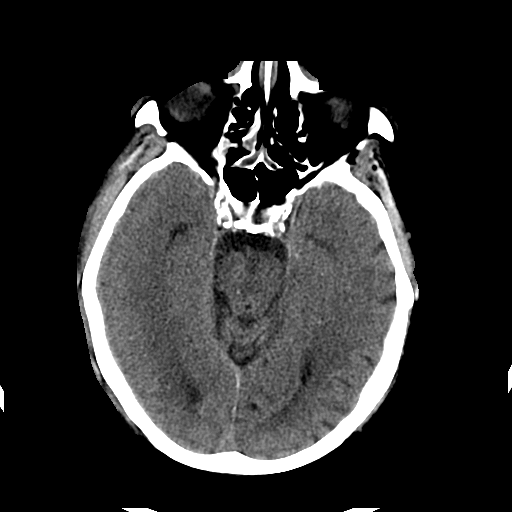
[im 12/33  bone]
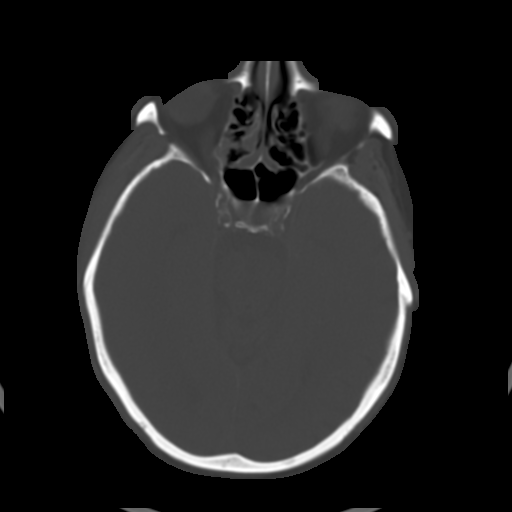
[im 14/33  brain]
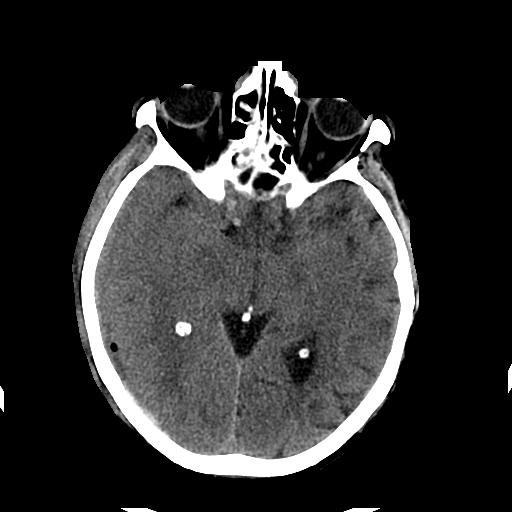
[im 17/33  brain]
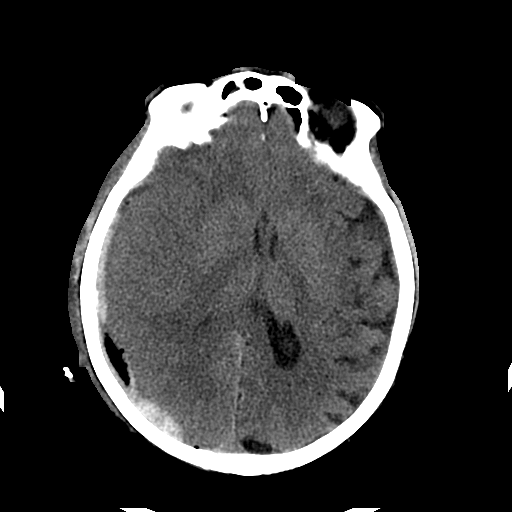
[im 19/33  brain]
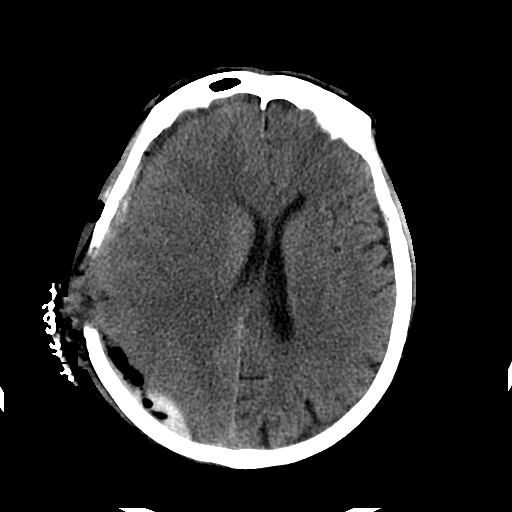
[im 21/33  brain]
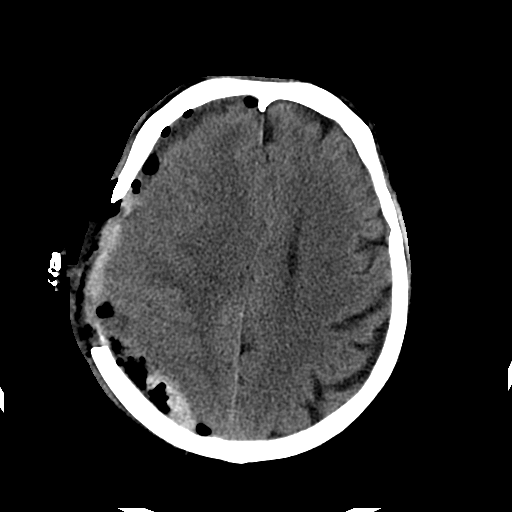
[im 21/33  bone]
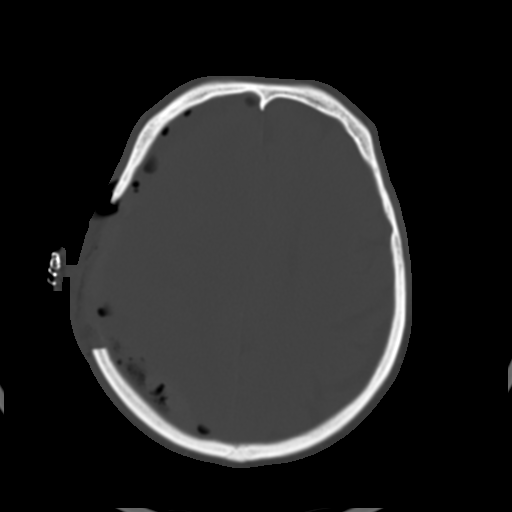
[im 23/33  brain]
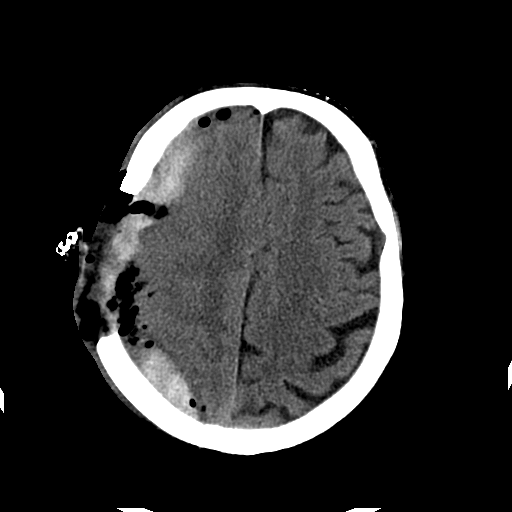
[im 26/33  brain]
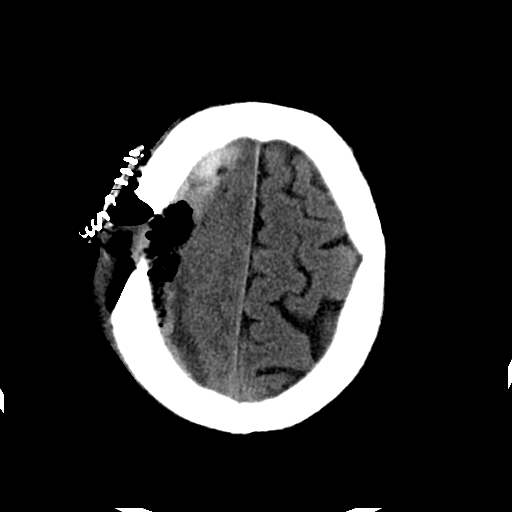
[im 28/33  brain]
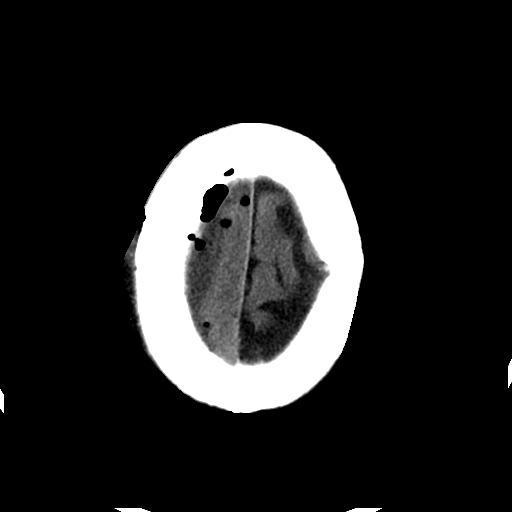
[im 30/33  brain]
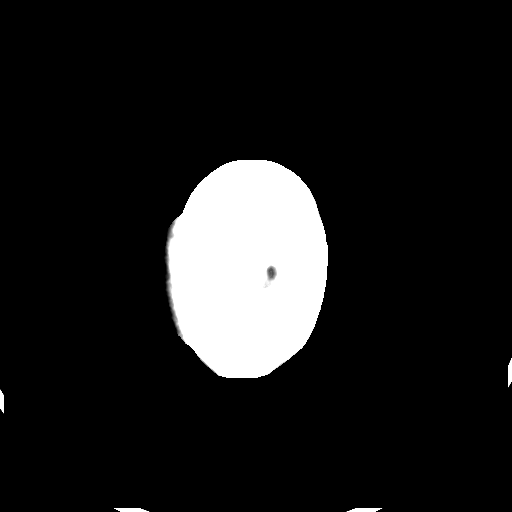
[im 30/33  bone]
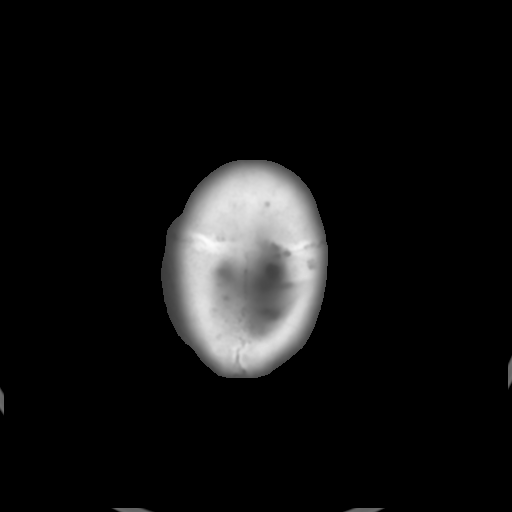

[Series 202: head w/o bone, idose (1) · axial · non-contrast · 0.45mm/px · 1 of 33 slices shown]
[im 3/33  bone]
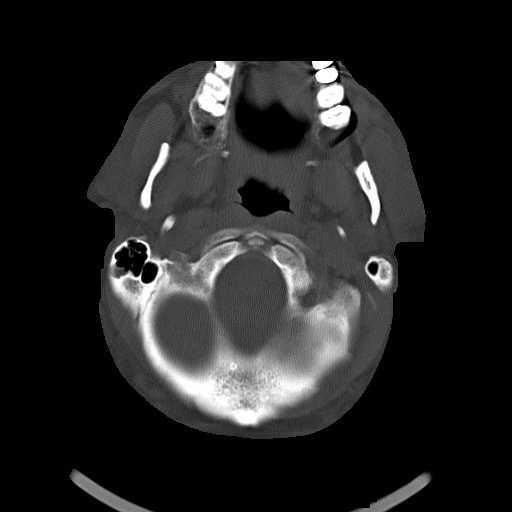

[14 of 30 positions shown; findings below may reference images not displayed]

FINDINGS: Postoperative changes from interval removal of right craniectomy
bone flap for evacuation of recurrent right subdural hematoma are
seen. There is overlying scalp soft tissue swelling with emphysema
and edema. Skin staples present. The craniectomy bone flap remains
removed at this time. Brain minimally protrudes to the craniectomy
defect.

Previously identified right subdural hematoma appears slightly
decreased in size status post evacuation now measuring up to 14 mm
in maximal thickness, previously 15 mm. This collection appears to
be acute on chronic in nature with more chronic appearing hypodense
subdural fluid present. Overall, degree of more acute hyperdense
blood is not significantly changed with no definite new acute
intracranial hemorrhage. Blood seen layering along the right
tentorium. There is mild mass effect on the subjacent right cerebral
hemisphere with effacement of the cortical sulci. There is
persistent 9 mm of right-to-left shift. Right lateral ventricle is
partially effaced. Basilar cisterns remain patent. Mild asymmetric
dilatation of the left lateral ventricle is similar. No
transependymal flow of CSF.

No new large vessel territory infarct.  No mass lesion.

No acute abnormality about the orbits.

Scattered mucosal thickening present within the ethmoidal air cells,
right frontal sinus, and right sphenoid sinus. No air-fluid level to
suggest active sinus infection. No mastoid effusion.
IMPRESSION: 1. Postoperative changes from interval removal of right-sided
craniectomy bone flap for evacuation of recurrent right subdural
hematoma. No complication.
2. Slight interval decrease in size of acute on chronic right
subdural collection now measuring up to 14 mm in maximal thickness.
3. Persistent 9 mm of right-to-left midline shift with partial
effacement of the right lateral ventricle. No hydrocephalus. Basilar
cisterns are patent.

## 2016-04-14 ENCOUNTER — Encounter (HOSPITAL_COMMUNITY): Payer: Self-pay | Admitting: *Deleted

## 2016-04-14 ENCOUNTER — Ambulatory Visit (HOSPITAL_COMMUNITY): Payer: Medicare Other | Admitting: Anesthesiology

## 2016-04-14 ENCOUNTER — Ambulatory Visit (HOSPITAL_COMMUNITY)
Admission: RE | Admit: 2016-04-14 | Discharge: 2016-04-14 | Disposition: A | Discharge status: Home / Self Care | Payer: Medicare Other | Source: Ambulatory Visit | Attending: General Surgery | Admitting: General Surgery

## 2016-04-14 ENCOUNTER — Encounter (HOSPITAL_COMMUNITY)
Admission: RE | Disposition: A | Discharge status: Home / Self Care | Payer: Self-pay | Source: Ambulatory Visit | Attending: General Surgery

## 2016-04-14 DIAGNOSIS — C4361 Malignant melanoma of right upper limb, including shoulder: Secondary | ICD-10-CM | POA: Diagnosis not present

## 2016-04-14 DIAGNOSIS — E785 Hyperlipidemia, unspecified: Secondary | ICD-10-CM | POA: Diagnosis not present

## 2016-04-14 DIAGNOSIS — Z8546 Personal history of malignant neoplasm of prostate: Secondary | ICD-10-CM | POA: Diagnosis not present

## 2016-04-14 DIAGNOSIS — E7800 Pure hypercholesterolemia, unspecified: Secondary | ICD-10-CM | POA: Diagnosis not present

## 2016-04-14 DIAGNOSIS — D0361 Melanoma in situ of right upper limb, including shoulder: Secondary | ICD-10-CM | POA: Diagnosis not present

## 2016-04-14 DIAGNOSIS — Z79899 Other long term (current) drug therapy: Secondary | ICD-10-CM | POA: Diagnosis not present

## 2016-04-14 DIAGNOSIS — R590 Localized enlarged lymph nodes: Secondary | ICD-10-CM | POA: Diagnosis not present

## 2016-04-14 DIAGNOSIS — G40909 Epilepsy, unspecified, not intractable, without status epilepticus: Secondary | ICD-10-CM | POA: Insufficient documentation

## 2016-04-14 DIAGNOSIS — I10 Essential (primary) hypertension: Secondary | ICD-10-CM | POA: Insufficient documentation

## 2016-04-14 HISTORY — PX: EXCISION MASS NECK: SHX6703

## 2016-04-14 HISTORY — PX: EXCISION MELANOMA WITH SENTINEL LYMPH NODE BIOPSY: SHX5628

## 2016-04-14 SURGERY — EXCISION, MELANOMA, WITH SENTINEL LYMPH NODE BIOPSY
Anesthesia: General | Site: Shoulder | Laterality: Right

## 2016-04-14 MED ORDER — LIDOCAINE HCL (CARDIAC) 20 MG/ML IV SOLN
INTRAVENOUS | Status: DC | PRN
  Administered 2016-04-14: 50 mg via INTRAVENOUS

## 2016-04-14 MED ORDER — PROMETHAZINE HCL 25 MG/ML IJ SOLN: 6.2500 mg | INTRAMUSCULAR | Status: DC | PRN

## 2016-04-14 MED ORDER — METHYLENE BLUE 0.5 % INJ SOLN
INTRAVENOUS | Status: AC
  Filled 2016-04-14: qty 10

## 2016-04-14 MED ORDER — ONDANSETRON HCL 4 MG/2ML IJ SOLN
INTRAMUSCULAR | Status: DC | PRN
  Administered 2016-04-14: 4 mg via INTRAVENOUS

## 2016-04-14 MED ORDER — NEOSTIGMINE METHYLSULFATE 5 MG/5ML IV SOSY
PREFILLED_SYRINGE | INTRAVENOUS | Status: AC
  Filled 2016-04-14: qty 5

## 2016-04-14 MED ORDER — ROCURONIUM BROMIDE 100 MG/10ML IV SOLN
INTRAVENOUS | Status: DC | PRN
  Administered 2016-04-14: 50 mg via INTRAVENOUS

## 2016-04-14 MED ORDER — MIDAZOLAM HCL 2 MG/2ML IJ SOLN: 2.0000 mg | Freq: Once | INTRAMUSCULAR | Status: DC

## 2016-04-14 MED ORDER — CHLORHEXIDINE GLUCONATE CLOTH 2 % EX PADS: 6.0000 | MEDICATED_PAD | Freq: Once | CUTANEOUS | Status: DC

## 2016-04-14 MED ORDER — 0.9 % SODIUM CHLORIDE (POUR BTL) OPTIME
TOPICAL | Status: DC | PRN
  Administered 2016-04-14: 1000 mL

## 2016-04-14 MED ORDER — BACITRACIN-NEOMYCIN-POLYMYXIN 400-5-5000 EX OINT
TOPICAL_OINTMENT | CUTANEOUS | Status: AC
  Filled 2016-04-14: qty 1

## 2016-04-14 MED ORDER — CEFAZOLIN SODIUM-DEXTROSE 2-4 GM/100ML-% IV SOLN
INTRAVENOUS | Status: AC
  Filled 2016-04-14: qty 100

## 2016-04-14 MED ORDER — TECHNETIUM TC 99M SULFUR COLLOID FILTERED
0.5000 | Freq: Once | INTRAVENOUS | Status: AC | PRN
  Administered 2016-04-14: 0.5 via INTRADERMAL

## 2016-04-14 MED ORDER — ONDANSETRON HCL 4 MG/2ML IJ SOLN
INTRAMUSCULAR | Status: AC
  Filled 2016-04-14: qty 2

## 2016-04-14 MED ORDER — MEPERIDINE HCL 25 MG/ML IJ SOLN: 6.2500 mg | INTRAMUSCULAR | Status: DC | PRN

## 2016-04-14 MED ORDER — PROPOFOL 10 MG/ML IV BOLUS
INTRAVENOUS | Status: DC | PRN
  Administered 2016-04-14: 160 mg via INTRAVENOUS

## 2016-04-14 MED ORDER — OXYCODONE HCL 5 MG PO TABS: 5.0000 mg | ORAL_TABLET | Freq: Once | ORAL | Status: DC | PRN

## 2016-04-14 MED ORDER — BUPIVACAINE-EPINEPHRINE (PF) 0.5% -1:200000 IJ SOLN
INTRAMUSCULAR | Status: AC
  Filled 2016-04-14: qty 30

## 2016-04-14 MED ORDER — ONDANSETRON HCL 4 MG/2ML IJ SOLN: 4.0000 mg | Freq: Once | INTRAMUSCULAR | Status: DC | PRN

## 2016-04-14 MED ORDER — LACTATED RINGERS IV SOLN
INTRAVENOUS | Status: DC
  Administered 2016-04-14: 09:00:00 via INTRAVENOUS

## 2016-04-14 MED ORDER — PROPOFOL 10 MG/ML IV BOLUS
INTRAVENOUS | Status: AC
  Filled 2016-04-14: qty 20

## 2016-04-14 MED ORDER — HYDROMORPHONE HCL 1 MG/ML IJ SOLN: 0.2500 mg | INTRAMUSCULAR | Status: DC | PRN

## 2016-04-14 MED ORDER — OXYCODONE HCL 5 MG/5ML PO SOLN: 5.0000 mg | Freq: Once | ORAL | Status: DC | PRN

## 2016-04-14 MED ORDER — ROCURONIUM BROMIDE 10 MG/ML (PF) SYRINGE
PREFILLED_SYRINGE | INTRAVENOUS | Status: AC
  Filled 2016-04-14: qty 10

## 2016-04-14 MED ORDER — FENTANYL CITRATE (PF) 100 MCG/2ML IJ SOLN
100.0000 ug | Freq: Once | INTRAMUSCULAR | Status: AC
  Administered 2016-04-14: 25 ug via INTRAVENOUS

## 2016-04-14 MED ORDER — LIDOCAINE-EPINEPHRINE 1 %-1:100000 IJ SOLN
INTRAMUSCULAR | Status: AC
  Filled 2016-04-14: qty 1

## 2016-04-14 MED ORDER — BUPIVACAINE-EPINEPHRINE 0.5% -1:200000 IJ SOLN
INTRAMUSCULAR | Status: DC | PRN
  Administered 2016-04-14: 4 mL

## 2016-04-14 MED ORDER — MIDAZOLAM HCL 2 MG/2ML IJ SOLN
INTRAMUSCULAR | Status: AC
  Administered 2016-04-14: 0.5 mg
  Filled 2016-04-14: qty 2

## 2016-04-14 MED ORDER — FENTANYL CITRATE (PF) 100 MCG/2ML IJ SOLN
INTRAMUSCULAR | Status: AC
  Filled 2016-04-14: qty 2

## 2016-04-14 MED ORDER — PHENYLEPHRINE 40 MCG/ML (10ML) SYRINGE FOR IV PUSH (FOR BLOOD PRESSURE SUPPORT)
PREFILLED_SYRINGE | INTRAVENOUS | Status: AC
  Filled 2016-04-14: qty 10

## 2016-04-14 MED ORDER — PHENYLEPHRINE HCL 10 MG/ML IJ SOLN
INTRAMUSCULAR | Status: DC | PRN
  Administered 2016-04-14 (×3): 80 ug via INTRAVENOUS

## 2016-04-14 MED ORDER — GLYCOPYRROLATE 0.2 MG/ML IV SOSY
PREFILLED_SYRINGE | INTRAVENOUS | Status: AC
  Filled 2016-04-14: qty 3

## 2016-04-14 MED ORDER — CEFAZOLIN SODIUM-DEXTROSE 2-4 GM/100ML-% IV SOLN
2.0000 g | INTRAVENOUS | Status: AC
  Administered 2016-04-14: 2 g via INTRAVENOUS

## 2016-04-14 MED ORDER — FENTANYL CITRATE (PF) 100 MCG/2ML IJ SOLN
INTRAMUSCULAR | Status: DC | PRN
  Administered 2016-04-14: 100 ug via INTRAVENOUS

## 2016-04-14 MED ORDER — LIDOCAINE 2% (20 MG/ML) 5 ML SYRINGE
INTRAMUSCULAR | Status: AC
  Filled 2016-04-14: qty 5

## 2016-04-14 MED ORDER — HYDROCODONE-ACETAMINOPHEN 5-325 MG PO TABS: 1.0000 | ORAL_TABLET | ORAL | 0 refills | Status: DC | PRN

## 2016-04-14 MED ORDER — NEOSTIGMINE METHYLSULFATE 10 MG/10ML IV SOLN
INTRAVENOUS | Status: DC | PRN
  Administered 2016-04-14: 3 mg via INTRAVENOUS

## 2016-04-14 MED ORDER — GLYCOPYRROLATE 0.2 MG/ML IJ SOLN
INTRAMUSCULAR | Status: DC | PRN
  Administered 2016-04-14: 0.4 mg via INTRAVENOUS

## 2016-04-14 SURGICAL SUPPLY — 39 items
BLADE SURG 15 STRL LF DISP TIS (BLADE) ×2 IMPLANT
BLADE SURG 15 STRL SS (BLADE) ×1
CANISTER SUCTION 2500CC (MISCELLANEOUS) ×3 IMPLANT
CHLORAPREP W/TINT 26ML (MISCELLANEOUS) ×3 IMPLANT
CLEANER TIP ELECTROSURG 2X2 (MISCELLANEOUS) ×3 IMPLANT
CONT SPEC 4OZ CLIKSEAL STRL BL (MISCELLANEOUS) ×9 IMPLANT
COVER PROBE W GEL 5X96 (DRAPES) ×3 IMPLANT
COVER SURGICAL LIGHT HANDLE (MISCELLANEOUS) ×3 IMPLANT
DERMABOND ADVANCED (GAUZE/BANDAGES/DRESSINGS) ×1
DERMABOND ADVANCED .7 DNX12 (GAUZE/BANDAGES/DRESSINGS) ×2 IMPLANT
DRAPE CHEST BREAST 15X10 FENES (DRAPES) ×3 IMPLANT
DRAPE UTILITY XL STRL (DRAPES) ×6 IMPLANT
ELECT CAUTERY BLADE 6.4 (BLADE) ×3 IMPLANT
ELECT COATED BLADE 2.86 ST (ELECTRODE) ×3 IMPLANT
ELECT REM PT RETURN 9FT ADLT (ELECTROSURGICAL) ×3
ELECTRODE REM PT RTRN 9FT ADLT (ELECTROSURGICAL) ×2 IMPLANT
GAUZE SPONGE 4X4 12PLY STRL (GAUZE/BANDAGES/DRESSINGS) ×3 IMPLANT
GLOVE BIO SURGEON STRL SZ7.5 (GLOVE) ×3 IMPLANT
GLOVE BIOGEL M 7.0 STRL (GLOVE) ×3 IMPLANT
GOWN STRL REUS W/ TWL LRG LVL3 (GOWN DISPOSABLE) ×6 IMPLANT
GOWN STRL REUS W/TWL LRG LVL3 (GOWN DISPOSABLE) ×3
KIT BASIN OR (CUSTOM PROCEDURE TRAY) ×3 IMPLANT
KIT ROOM TURNOVER OR (KITS) ×3 IMPLANT
NEEDLE HYPO 25GX1X1/2 BEV (NEEDLE) ×3 IMPLANT
NS IRRIG 1000ML POUR BTL (IV SOLUTION) ×3 IMPLANT
PACK GENERAL/GYN (CUSTOM PROCEDURE TRAY) ×3 IMPLANT
PAD ARMBOARD 7.5X6 YLW CONV (MISCELLANEOUS) ×6 IMPLANT
PENCIL BUTTON HOLSTER BLD 10FT (ELECTRODE) ×3 IMPLANT
SUT ETHILON 3 0 PS 1 (SUTURE) ×6 IMPLANT
SUT MNCRL AB 4-0 PS2 18 (SUTURE) ×3 IMPLANT
SUT MON AB 4-0 PC3 18 (SUTURE) ×3 IMPLANT
SUT SILK 2 0 SH (SUTURE) ×3 IMPLANT
SUT VIC AB 3-0 SH 27 (SUTURE) ×1
SUT VIC AB 3-0 SH 27XBRD (SUTURE) ×2 IMPLANT
SUT VIC AB 4-0 PS2 27 (SUTURE) ×3 IMPLANT
SYR CONTROL 10ML LL (SYRINGE) ×3 IMPLANT
TAPE CLOTH SURG 4X10 WHT LF (GAUZE/BANDAGES/DRESSINGS) ×3 IMPLANT
TOWEL OR 17X24 6PK STRL BLUE (TOWEL DISPOSABLE) ×3 IMPLANT
TOWEL OR 17X26 10 PK STRL BLUE (TOWEL DISPOSABLE) ×3 IMPLANT

## 2016-04-14 NOTE — Op Note (Addendum)
04/14/2016  11:18 AM  PATIENT:  Martin Edwards  70 y.o. male  PRE-OPERATIVE DIAGNOSIS:  right shoulder melanoma  POST-OPERATIVE DIAGNOSIS:  Right shoulder melanoma  PROCEDURE:  Procedure(s): WIDE EXCISION MELANOMA RIGHT SHOULDER WITH SENTINEL  NODE MAPPING (Right) EXCISION BIOPSY OF RIGHT NECK LYMPH NODE (Right)  SURGEON:  Surgeon(s) and Role: Panel 1:    * Jovita Kussmaul, MD - Primary  Panel 2:    * Jerrell Belfast, MD - Primary  PHYSICIAN ASSISTANT:   ASSISTANTS: Dr. Wilburn Cornelia   ANESTHESIA:   general  EBL:  No intake/output data recorded.  BLOOD ADMINISTERED:none  DRAINS: none   LOCAL MEDICATIONS USED:  MARCAINE     SPECIMEN:  Source of Specimen:  wide excision right shoulder melanoma and sentinel node X 2  DISPOSITION OF SPECIMEN:  PATHOLOGY  COUNTS:  YES  TOURNIQUET:  * No tourniquets in log *  DICTATION: .Dragon Dictation   After informed consent was obtained the patient was brought to the operating room and placed in the supine position on the operating room table. After adequate induction of general anesthesia the patient's right shoulder area was propped up on a beanbag and the right shoulder and neck area were prepped with ChloraPrep, allowed to dry, and draped in usual sterile manner. An appropriate timeout was performed. Earlier in the day the patient underwent injection of 1 mCi of technetium sulfur colloid in the peritumoral area of the right shoulder where there was previously a melanoma that was shaved by the dermatologist. The neoprobe was set to technetium in the supraclavicular area was examined based on his previous lymphoscintigram. A strong signal was identified medially in the right supraclavicular area. This area was dissected by Dr. Wilburn Cornelia and 2 sentinel nodes were removed. His portion of the procedure will be dictated separately. Once this right supraclavicular incision was closed then attention was turned to the right shoulder. An elliptical  incision was made around the area of the previous melanoma in order to provide at least a centimeter margin. The incision was carried through the skin and subcutaneous tissue sharply with a 15 blade knife. The full thickness area was excised sharply with the 15 blade knife. Once the specimen was removed it was oriented with a long single stitch on the lateral skin and a short double stitch on the dorsal edge of the skin. The size of the specimen removed measured about 4X2cm. The specimen was then sent to pathology for further evaluation. Hemostasis was achieved using the Bovie electrocautery. The skin edges were undermined circumferentially to allow enough mobility of the skin to close it primarily. The incision was closed with interrupted 3-0 nylon stitches. Triple antibiotic ointment and sterile dressings were applied. The patient tolerated the procedure well. At the end of the case all needle sponge and instrument counts were correct. The patient was then awakened and taken to recovery in stable condition.  PLAN OF CARE: Discharge to home after PACU  PATIENT DISPOSITION:  PACU - hemodynamically stable.   Delay start of Pharmacological VTE agent (>24hrs) due to surgical blood loss or risk of bleeding: not applicable

## 2016-04-14 NOTE — Addendum Note (Signed)
Addendum  created 04/14/16 2133 by Roberts Gaudy, MD   Sign clinical note

## 2016-04-14 NOTE — Transfer of Care (Signed)
Immediate Anesthesia Transfer of Care Note  Patient: Martin Edwards  Procedure(s) Performed: Procedure(s): WIDE EXCISION MELANOMA RIGHT SHOULDER WITH SENTINEL  NODE MAPPING (Right) EXCISION BIOPSY OF RIGHT NECK LYMPH NODE (Right)  Patient Location: PACU  Anesthesia Type:General  Level of Consciousness: awake, alert  and oriented  Airway & Oxygen Therapy: Patient Spontanous Breathing  Post-op Assessment: Report given to RN and Post -op Vital signs reviewed and stable  Post vital signs: Reviewed and stable  Last Vitals:  Vitals:   04/14/16 0822 04/14/16 1130  BP: (!) 136/57 134/74  Pulse: 71 73  Resp: 18 17  Temp: 36.6 C 36.6 C    Last Pain:  Vitals:   04/14/16 0822  TempSrc: Oral         Complications: No apparent anesthesia complications

## 2016-04-14 NOTE — Anesthesia Postprocedure Evaluation (Signed)
Anesthesia Post Note  Patient: Martin Edwards  Procedure(s) Performed: Procedure(s) (LRB): WIDE EXCISION MELANOMA RIGHT SHOULDER WITH SENTINEL  NODE MAPPING (Right) EXCISION BIOPSY OF RIGHT NECK LYMPH NODE (Right)  Patient location during evaluation: PACU Anesthesia Type: General Level of consciousness: awake, awake and alert and oriented Pain management: pain level controlled Vital Signs Assessment: post-procedure vital signs reviewed and stable Respiratory status: spontaneous breathing, nonlabored ventilation and respiratory function stable Cardiovascular status: blood pressure returned to baseline Anesthetic complications: no    Last Vitals:  Vitals:   04/14/16 1153 04/14/16 1203  BP: 122/74 136/82  Pulse: 66 65  Resp: 17 18  Temp:      Last Pain:  Vitals:   04/14/16 1203  TempSrc:   PainSc: 0-No pain                 Destine Ambroise COKER

## 2016-04-14 NOTE — Anesthesia Preprocedure Evaluation (Addendum)
Anesthesia Evaluation  Patient identified by MRN, date of birth, ID band Patient awake    Reviewed: Allergy & Precautions, NPO status , Patient's Chart, lab work & pertinent test results, reviewed documented beta blocker date and time   Airway Mallampati: I  TM Distance: >3 FB Neck ROM: Full    Dental no notable dental hx. (+) Teeth Intact, Dental Advisory Given   Pulmonary neg pulmonary ROS,    Pulmonary exam normal breath sounds clear to auscultation       Cardiovascular hypertension, Pt. 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   Abdominal   Peds  Hematology negative hematology ROS (+)   Anesthesia Other Findings   Reproductive/Obstetrics                            Anesthesia Physical  Anesthesia Plan  ASA: III  Anesthesia Plan: General   Post-op Pain Management:    Induction: Intravenous  Airway Management Planned: Oral ETT and LMA  Additional Equipment:   Intra-op Plan:   Post-operative Plan: Extubation in OR  Informed Consent: I have reviewed the patients History and Physical, chart, labs and discussed the procedure including the risks, benefits and alternatives for the proposed anesthesia with the patient or authorized representative who has indicated his/her understanding and acceptance.   Dental advisory given  Plan Discussed with: CRNA, Anesthesiologist and Surgeon  Anesthesia Plan Comments:         Anesthesia Quick Evaluation

## 2016-04-14 NOTE — Brief Op Note (Signed)
04/14/2016  10:57 AM  PATIENT:  Jacqulyn Liner  70 y.o. male  PRE-OPERATIVE DIAGNOSIS:  right shoulder melanoma  POST-OPERATIVE DIAGNOSIS:  * No post-op diagnosis entered *  PROCEDURE:  Procedure(s): WIDE EXCISION MELANOMA RIGHT SHOULDER WITH SENTINEL  NODE MAPPING (Right) EXCISION BIOPSY OF RIGHT NECK LYMPH NODE (Right)  SURGEON:  Surgeon(s) and Role: Panel 1:    * Jovita Kussmaul, MD - Primary  Panel 2:    * Jerrell Belfast, MD - Primary  PHYSICIAN ASSISTANT:   ASSISTANTS: none   ANESTHESIA:   general  EBL:  No intake/output data recorded.  BLOOD ADMINISTERED:none  DRAINS: none   LOCAL MEDICATIONS USED:  NONE  SPECIMEN:  Source of Specimen:  Right supraclavicular mass  DISPOSITION OF SPECIMEN:  PATHOLOGY  COUNTS:  YES  TOURNIQUET:  * No tourniquets in log *  DICTATION: .Other Dictation: Dictation Number [id number]  PLAN OF CARE: Admit for overnight observation  PATIENT DISPOSITION:  PACU - hemodynamically stable.   Delay start of Pharmacological VTE agent (>24hrs) due to surgical blood loss or risk of bleeding: not applicable

## 2016-04-14 NOTE — Anesthesia Procedure Notes (Signed)
Procedure Name: Intubation Date/Time: 04/14/2016 10:16 AM Performed by: Manuela Schwartz B Pre-anesthesia Checklist: Patient identified, Emergency Drugs available, Suction available, Patient being monitored and Timeout performed Patient Re-evaluated:Patient Re-evaluated prior to inductionOxygen Delivery Method: Circle system utilized Preoxygenation: Pre-oxygenation with 100% oxygen Intubation Type: IV induction Ventilation: Mask ventilation without difficulty and Oral airway inserted - appropriate to patient size Laryngoscope Size: Mac and 3 Grade View: Grade I Tube type: Oral Tube size: 7.5 mm Number of attempts: 1 Airway Equipment and Method: Stylet Placement Confirmation: ETT inserted through vocal cords under direct vision,  positive ETCO2 and breath sounds checked- equal and bilateral Secured at: 22 cm Tube secured with: Tape Dental Injury: Teeth and Oropharynx as per pre-operative assessment

## 2016-04-14 NOTE — H&P (Signed)
Martin Edwards  Location: [facility] Patient #: [phone] DOB: 1945/07/16 Divorced / Language: Martin Edwards / Race: White Male   History of Present Illness  Patient words: melanoma.  The patient is a 70 year old male who presents for the evaluation of a skin lesion. We are asked to see the patient in consultation by Dr. Arlyss Queen to evaluate him for a melanoma on the right shoulder. The patient is a 70 year old white male who recently went to see a dermatologist. He does not seem to recall why he went to see the dermatologist. At that visit he was found to have a worrisome lesion on the right shoulder. The patient does not recall any ulceration or bleeding from the area. The area was shaved by the dermatologist and it came back as a melanoma with a thickness of 0.8 mm. The patient also states that he fell about a year ago and had a head bleed that required surgery. He spent the next 7 months recovering from this. His appetite is good and his bowels are working normally. He denies any abdominal pain. He has no other complaints.   Other Problems  High blood pressure Hypercholesterolemia Kidney Stone Melanoma Prostate Cancer Seizure Disorder  Past Surgical History  Colon Removal - Partial Foot Surgery Left. Prostate Surgery - Removal  Diagnostic Studies History  Colonoscopy 1-5 years ago  Allergies  No Known Drug Allergies  Medication History AmLODIPine Besylate (10MG  Tablet, Oral) Active. Atorvastatin Calcium (40MG  Tablet, Oral) Active. Lisinopril (40MG  Tablet, Oral) Active. LevETIRAcetam (1000MG  Tablet, Oral) Active. Phenytoin Sodium Extended (100MG  Capsule, Oral) Active. Pantoprazole Sodium (40MG  Tablet DR, Oral) Active. Medications Reconciled  Social History  Caffeine use Coffee, Tea. No alcohol use No drug use Tobacco use Never smoker.  Family History  Hypertension Sister.    Review of Systems General Not Present-  Appetite Loss, Chills, Fatigue, Fever, Night Sweats, Weight Gain and Weight Loss. Skin Present- Ulcer. Not Present- Change in Wart/Mole, Dryness, Hives, Jaundice, New Lesions, Non-Healing Wounds and Rash. HEENT Not Present- Earache, Hearing Loss, Hoarseness, Nose Bleed, Oral Ulcers, Ringing in the Ears, Seasonal Allergies, Sinus Pain, Sore Throat, Visual Disturbances, Wears glasses/contact lenses and Yellow Eyes. Respiratory Not Present- Bloody sputum, Chronic Cough, Difficulty Breathing, Snoring and Wheezing. Breast Not Present- Breast Mass, Breast Pain, Nipple Discharge and Skin Changes. Cardiovascular Not Present- Chest Pain, Difficulty Breathing Lying Down, Leg Cramps, Palpitations, Rapid Heart Rate, Shortness of Breath and Swelling of Extremities. Gastrointestinal Not Present- Abdominal Pain, Bloating, Bloody Stool, Change in Bowel Habits, Chronic diarrhea, Constipation, Difficulty Swallowing, Excessive gas, Gets full quickly at meals, Hemorrhoids, Indigestion, Nausea, Rectal Pain and Vomiting. Male Genitourinary Not Present- Blood in Urine, Change in Urinary Stream, Frequency, Impotence, Nocturia, Painful Urination, Urgency and Urine Leakage. Musculoskeletal Not Present- Back Pain, Joint Pain, Joint Stiffness, Muscle Pain, Muscle Weakness and Swelling of Extremities. Neurological Present- Decreased Memory and Seizures. Not Present- Fainting, Headaches, Numbness, Tingling, Tremor, Trouble walking and Weakness. Psychiatric Not Present- Anxiety, Bipolar, Change in Sleep Pattern, Depression, Fearful and Frequent crying. Endocrine Not Present- Cold Intolerance, Excessive Hunger, Hair Changes, Heat Intolerance, Hot flashes and New Diabetes. Hematology Not Present- Blood Thinners, Easy Bruising, Excessive bleeding, Gland problems, HIV and Persistent Infections.  Vitals  Weight: 163.5 lb Height: 70in Body Surface Area: 1.92 m Body Mass Index: 23.46 kg/m  BP: 132/74 (Sitting, Left Arm,  Standard)       Physical Exam General Mental Status-Alert. General Appearance-Consistent with stated age. Hydration-Well hydrated. Voice-Normal.  Integumentary Note: There  is a small scab on the shoulder. The patient has lots of keratotic moles on the skin but none that appear worrisome in that area. There is no palpable groin, supraclavicular, cervical, or axillary lymphadenopathy.   Head and Neck Head-normocephalic, atraumatic with no lesions or palpable masses. Trachea-midline. Thyroid Gland Characteristics - normal size and consistency.  Eye Eyeball - Bilateral-Extraocular movements intact. Sclera/Conjunctiva - Bilateral-No scleral icterus.  Chest and Lung Exam Chest and lung exam reveals -quiet, even and easy respiratory effort with no use of accessory muscles and on auscultation, normal breath sounds, no adventitious sounds and normal vocal resonance. Inspection Chest Wall - Normal. Back - normal.  Cardiovascular Cardiovascular examination reveals -normal heart sounds, regular rate and rhythm with no murmurs and normal pedal pulses bilaterally.  Abdomen Inspection Inspection of the abdomen reveals - No Hernias. Skin - Scar - no surgical scars. Palpation/Percussion Palpation and Percussion of the abdomen reveal - Soft, Non Tender, No Rebound tenderness, No Rigidity (guarding) and No hepatosplenomegaly. Auscultation Auscultation of the abdomen reveals - Bowel sounds normal.  Neurologic Neurologic evaluation reveals -alert and oriented x 3 with no impairment of recent or remote memory. Mental Status-Normal.  Musculoskeletal Normal Exam - Left-Upper Extremity Strength Normal and Lower Extremity Strength Normal. Normal Exam - Right-Upper Extremity Strength Normal and Lower Extremity Strength Normal.  Lymphatic Head & Neck  General Head & Neck Lymphatics: Bilateral - Description - Normal. Axillary  General Axillary Region:  Bilateral - Description - Normal. Tenderness - Non Tender. Femoral & Inguinal  Generalized Femoral & Inguinal Lymphatics: Bilateral - Description - Normal. Tenderness - Non Tender.    Assessment & Plan  MELANOMA OF SHOULDER (C43.60) MALIGNANT MELANOMA OF RIGHT SHOULDER (C43.61) Impression: The patient appears to have a malignant melanoma of the right shoulder that was 0.8 mm deep. At this point I would recommend getting a lymphoscintigraphy study to see which nodal basin this area drains to. He will then need a wide excision and sentinel node mapping to adequately excise the area of melanoma. I have discussed with him in detail the risks and benefits of the operation to do this as well as some of the technical aspects and he understands and wishes to proceed Current Plans LYMPHOSCINTIGRAPHY [id number])

## 2016-04-14 NOTE — Interval H&P Note (Signed)
History and Physical Interval Note:  04/14/2016 9:36 AM  Jacqulyn Liner  has presented today for surgery, with the diagnosis of right shoulder melanoma  The various methods of treatment have been discussed with the patient and family. After consideration of risks, benefits and other options for treatment, the patient has consented to  Procedure(s): WIDE EXCISION MELANOMA RIGHT SHOULDER WITH SENTINEL  NODE MAPPING (Right) EXCISION BIOPSY OF RIGHT NECK LYMPH NODE (Right) as a surgical intervention .  The patient's history has been reviewed, patient examined, no change in status, stable for surgery.  I have reviewed the patient's chart and labs.  Questions were answered to the patient's satisfaction.     TOTH III,PAUL S

## 2016-04-14 NOTE — Anesthesia Postprocedure Evaluation (Signed)
Anesthesia Post Note  Patient: SHNEUR ULRICH  Procedure(s) Performed: Procedure(s) (LRB): WIDE EXCISION MELANOMA RIGHT SHOULDER WITH SENTINEL  NODE MAPPING (Right) EXCISION BIOPSY OF RIGHT NECK LYMPH NODE (Right)  Patient location during evaluation: PACU Anesthesia Type: General Level of consciousness: awake, oriented and awake and alert Pain management: pain level controlled Vital Signs Assessment: post-procedure vital signs reviewed and stable Respiratory status: spontaneous breathing, nonlabored ventilation and respiratory function stable Cardiovascular status: blood pressure returned to baseline Anesthetic complications: no    Last Vitals:  Vitals:   04/14/16 1153 04/14/16 1203  BP: 122/74 136/82  Pulse: 66 65  Resp: 17 18  Temp:      Last Pain:  Vitals:   04/14/16 1203  TempSrc:   PainSc: 0-No pain                 Katya Rolston COKER

## 2016-04-14 NOTE — Progress Notes (Signed)
   ENT Progress Note: Patient for: WIDE EXCISION MELANOMA RIGHT SHOULDER WITH SENTINEL  NODE MAPPING EXCISION BIOPSY OF RIGHT NECK LYMPH NODE   Subjective: Stable preOp  Objective: Vital signs in last 24 hours: Temp:  [97.9 F (36.6 C)] 97.9 F (36.6 C) (11/01 0822) Pulse Rate:  [71] 71 (11/01 0822) Resp:  [18] 18 (11/01 0822) BP: (136)/(57) 136/57 (11/01 0822) SpO2:  [100 %] 100 % (11/01 0822) Weight:  [75.8 kg (167 lb)] 75.8 kg (167 lb) (11/01 0822) Weight change:     Intake/Output from previous day: No intake/output data recorded. Intake/Output this shift: No intake/output data recorded.  Labs: No results for input(s): WBC, HGB, HCT, PLT in the last 72 hours. No results for input(s): NA, K, CL, CO2, GLUCOSE, BUN, CALCIUM in the last 72 hours.  Invalid input(s): CREATININR  Studies/Results: Nm Sentinel Node Inj-no Rpt (melanoma)  Result Date: 04/14/2016 CLINICAL DATA: right shoulder melanoma Sulfur colloid was injected by the nuclear medicine technologist for melanoma sentinel node.     PHYSICAL EXAM: No palpable neck mass   Assessment/Plan: Pt for exc of previous melanoma site and bx of sentinel node in supraclavicular fossa.     [place], Martin Edwards 04/14/2016, 10:11 AM

## 2016-04-15 ENCOUNTER — Encounter (HOSPITAL_COMMUNITY): Payer: Self-pay | Admitting: General Surgery

## 2016-04-15 NOTE — Op Note (Signed)
Martin Edwards, Martin NO.:  [id number]  MEDICAL RECORD NO.:  [id number]  LOCATION:  [facility]                          FACILITY:  [place] [facility]  PHYSICIAN:  Early Chars. Wilburn Cornelia, M.D.DATE OF BIRTH:  [birth year]  DATE OF PROCEDURE:  04/14/2016 DATE OF DISCHARGE:                              OPERATIVE REPORT   PREOPERATIVE DIAGNOSIS:  Melanoma, right shoulder.  POSTOPERATIVE DIAGNOSIS:  Melanoma, right shoulder.  INDICATIONS FOR SURGERY:  Melanoma, right shoulder.  SURGICAL PROCEDURE: 1. Sentinel node mapping with excision of right supraclavicular lymph     node performed by Dr. Wilburn Cornelia. 2. Wide local excision of right shoulder melanoma site which is     dictated as a separate operative procedure by Dr. Marlou Starks.  ANESTHESIA:  General endotracheal.  Surgeon for excision of supraclavicular lymph node, Dr. Jerrell Belfast.  Surgeon for wide local excision of melanoma, Dr. Marlou Starks.  ANESTHESIA:  General endotracheal.  COMPLICATIONS:  None.  ESTIMATED BLOOD LOSS:  Minimal.  The patient was transferred from the operating room to the recovery room in stable condition.  BRIEF HISTORY:  The patient presented to Dr. Marlou Starks after dermatologic excision of a melanoma in the right medial shoulder region.  The patient underwent initial mapping which showed lymph node drainage in the right supraclavicular fossa.  No palpable adenopathy or mass.  Based on the depth of the melanoma, it was recommended the patient undergo radionuclide central node mapping for identification of representative drainage lymph node for diagnostic and staging purposes.  The patient was seen as an outpatient.  The primary surgical site had healed without evidence of significant scarring or other abnormality.  There were no palpable lymph nodes or masses.  Given the patient's history and findings, we recommend the above surgical procedure and the risks and benefits were discussed in detail with the patient and his  son who understood and agreed with our plan for surgery.  Surgery scheduled on April 14, 2016 at [facility] under general anesthesia as an as an elective procedure.  DESCRIPTION OF PROCEDURE:  The patient was brought to the operating room on April 14, 2016.  He was placed in the supine position.  General endotracheal anesthesia was established without difficulty.  The patient was then rolled to a left lateral position with excellent exposure of the right shoulder and neck.  A surgical time-out was performed with correct identification of the patient, the surgical procedure including the right laterality.  The patient was prepped, draped, and prepared for surgery.  With the patient prepared for surgery, the probe was used to find the area of greatest activity which was in the right supraclavicular fossa. Immediately release superior to the mid aspect of the clavicle, approximately 2 cm incision was created in the skin, was carried through the subcutaneous tissue and immediate platysmal muscle layer.  The supraclavicular fossa was then gently explored using blunt and sharp dissection and the Neoprobe for identification of the area of greatest activity.  An initial cluster of fibrofatty tissue was removed and sent to pathology.  Second area deep within the supraclavicular fossa was identified with the Neoprobe and this was excised.  This appeared to be the highest  activity.  The area was then scanned again with the Neoprobe and only background activity was noted.  The soft tissue specimens were then sent to Pathology for gross microscopic evaluation.  The patient's wound was irrigated and then closed in layers with interrupted 4-0 Vicryl suture in deep subcutaneous fashion and Dermabond surgical glue on the cutaneous surface.  The wide local excision of the primary melanoma site with complex closure is dictated in a separate operative note by Dr. Marlou Starks.           ______________________________ Early Chars. Wilburn Cornelia, M.D.     DLS/MEDQ  D:  [id number]  T:  04/15/2016  Job:  [id number]

## 2016-05-18 ENCOUNTER — Encounter: Payer: Self-pay | Admitting: Neurology

## 2016-05-18 ENCOUNTER — Ambulatory Visit (INDEPENDENT_AMBULATORY_CARE_PROVIDER_SITE_OTHER): Payer: Medicare Other | Admitting: Neurology

## 2016-05-18 VITALS — BP 142/77 | HR 85 | Ht 70.0 in | Wt 163.0 lb

## 2016-05-18 DIAGNOSIS — R569 Unspecified convulsions: Secondary | ICD-10-CM

## 2016-05-18 NOTE — Patient Instructions (Signed)
   With the Dilantin 100 mg capsule, begin 2 a day for 2 weeks, then take one a day for 2 weeks, then stop. Call our office in about 2 months, if you are doing well, we will consider a taper off of the [place] a well. Call if you have any problems.

## 2016-05-18 NOTE — Progress Notes (Signed)
Reason for visit: Seizures  Martin Edwards is an 70 y.o. male  History of present illness:  Martin Edwards is a 70 year old right-handed white male with a history of symptomatic seizures associated with a large subdural hematoma following a fall in May 2016. The patient has had surgical drainage of the subdural, he has healed up. He has been treated with Keppra and Dilantin, he has not had any recurrent seizures since the time of the original hospitalization for the subdural hematoma. He is back to driving a motor vehicle, he is doing quite well at this time. He returns for an evaluation.  Past Medical History:  Diagnosis Date  . Basal cell carcinoma of face X 2  . GERD (gastroesophageal reflux disease)   . History of kidney stones   . Hyperlipidemia   . Hypertension   . Prostate cancer ([place]) 1999  . Seizures ([name]) 11/2014   brain trauma -hematoma  . Small bowel obstruction due to adhesions 01/30/2014  . Subdural hematoma [name])     Past Surgical History:  Procedure Laterality Date  . BASAL CELL CARCINOMA EXCISION  X 2   "off my face both times"  . COLECTOMY  10/2010   "took out 8 inches of colon; wasn't cancer"  . CRANIOTOMY Right 11/11/2014   Procedure: CRANIOTOMY HEMATOMA EVACUATION SUBDURAL;  Surgeon: Leeroy Cha, MD;  Location: [place] [facility];  Service: Neurosurgery;  Laterality: Right;  . CRANIOTOMY Right 11/13/2014   Procedure: Removal bone flap, insertion into the abdominal wall;  Surgeon: Leeroy Cha, MD;  Location: [place] NEUR[facility] ORS;  Service: Neurosurgery;  Laterality: Right;  . CRANIOTOMY N/A 03/07/2015   Procedure: Cranioplasty using Bone flap in abdomen;  Surgeon: Leeroy Cha, MD;  Location: [name];  Service: Neurosurgery;  Laterality: N/A;  Cranioplasty using Bone flap in abdomen  . EXCISION MASS NECK Right 04/14/2016   Procedure: EXCISION BIOPSY OF RIGHT NECK LYMPH NODE;  Surgeon: Jerrell Belfast, MD;  Location: [name];  Service: ENT;  Laterality: Right;  .  EXCISION MELANOMA WITH SENTINEL LYMPH NODE BIOPSY Right 04/14/2016   Procedure: WIDE EXCISION MELANOMA RIGHT SHOULDER WITH SENTINEL  NODE MAPPING;  Surgeon: Autumn Messing III, MD;  Location: [name];  Service: General;  Laterality: Right;  . PROSTATECTOMY  05/20/1998    Family History  Problem Relation Age of Onset  . Cancer Father     Lung    Social history:  reports that he has never smoked. He has never used smokeless tobacco. He reports that he does not drink alcohol or use drugs.    Allergies  Allergen Reactions  . Other Rash    Needs Hypoallergenic Sheets/ Laundry Cart had questionable allergic reaction to detergent last time he was here.    Medications:  Prior to Admission medications   Medication Sig Start Date End Date Taking? Authorizing Provider  amLODipine (NORVASC) 10 MG tablet Take 1 tablet (10 mg total) by mouth daily. 01/08/16  Yes Darlyne Russian, MD  atorvastatin (LIPITOR) 40 MG tablet Take 1 tablet (40 mg total) by mouth daily. 01/08/16  Yes Darlyne Russian, MD  hydrochlorothiazide (MICROZIDE) 12.5 MG capsule Take 1 capsule (12.5 mg total) by mouth daily. 01/08/16  Yes Darlyne Russian, MD  HYDROcodone-acetaminophen (NORCO/VICODIN) 5-325 MG tablet Take 1-2 tablets by mouth every 4 (four) hours as needed for moderate pain or severe pain. 04/14/16  Yes Autumn Messing III, MD  levETIRAcetam (KEPPRA) 1000 MG tablet Take 1 tablet by mouth two  times daily 11/11/15  Yes Ward Givens, NP  lisinopril (PRINIVIL,ZESTRIL) 40 MG tablet Take 1 tablet (40 mg total) by mouth daily. 01/08/16  Yes Darlyne Russian, MD  phenytoin (DILANTIN) 100 MG ER capsule Take 3 capsules (300 mg total) by mouth at bedtime. 11/11/15  Yes Ward Givens, NP    ROS:  Out of a complete 14 system review of symptoms, the patient complains only of the following symptoms, and all other reviewed systems are negative.  History of seizures  Blood pressure (!) 142/77, pulse 85, height 5\' 10"  (1.778 m), weight 163 lb (73.9  kg).  Physical Exam  General: The patient is alert and cooperative at the time of the examination.  Skin: No significant peripheral edema is noted.   Neurologic Exam  Mental status: The patient is alert and oriented x 3 at the time of the examination. The patient has apparent normal recent and remote memory, with an apparently normal attention span and concentration ability.   Cranial nerves: Facial symmetry is present. Speech is normal, no aphasia or dysarthria is noted. Extraocular movements are full. Visual fields are full.  Motor: The patient has good strength in all 4 extremities.  Sensory examination: Soft touch sensation is symmetric on the face, arms, and legs.  Coordination: The patient has good finger-nose-finger and heel-to-shin bilaterally.  Gait and station: The patient has a normal gait. Tandem gait is normal. Romberg is negative. No drift is seen.  Reflexes: Deep tendon reflexes are symmetric.   Assessment/Plan:  1. Symptomatic seizures  2. History of subdural hematoma  The patient should be able to come off of his seizure medications at this time. The patient will taper down off of the Dilantin by 100 mg every 2 weeks until off the drug. He will call me in 2 months if he is doing well, we may initiate a slow taper off of Keppra at that time. He will follow-up in 6 months, if he is doing well at that point we will see him on an as-needed basis.  [name] 05/18/2016 3:32 PM  [facility] [address] [place], [place] [address]  Phone [phone] Fax [phone]

## 2016-05-24 ENCOUNTER — Other Ambulatory Visit: Payer: Self-pay | Admitting: Emergency Medicine

## 2016-06-01 DIAGNOSIS — H2513 Age-related nuclear cataract, bilateral: Secondary | ICD-10-CM | POA: Diagnosis not present

## 2016-06-01 DIAGNOSIS — H11013 Amyloid pterygium of eye, bilateral: Secondary | ICD-10-CM | POA: Diagnosis not present

## 2016-06-01 DIAGNOSIS — H40013 Open angle with borderline findings, low risk, bilateral: Secondary | ICD-10-CM | POA: Diagnosis not present

## 2016-08-11 DIAGNOSIS — L814 Other melanin hyperpigmentation: Secondary | ICD-10-CM | POA: Diagnosis not present

## 2016-08-11 DIAGNOSIS — C44529 Squamous cell carcinoma of skin of other part of trunk: Secondary | ICD-10-CM | POA: Diagnosis not present

## 2016-08-11 DIAGNOSIS — D485 Neoplasm of uncertain behavior of skin: Secondary | ICD-10-CM | POA: Diagnosis not present

## 2016-08-11 DIAGNOSIS — D235 Other benign neoplasm of skin of trunk: Secondary | ICD-10-CM | POA: Diagnosis not present

## 2016-08-11 DIAGNOSIS — L570 Actinic keratosis: Secondary | ICD-10-CM | POA: Diagnosis not present

## 2016-09-07 ENCOUNTER — Telehealth: Payer: Self-pay | Admitting: Neurology

## 2016-09-07 MED ORDER — LEVETIRACETAM 250 MG PO TABS: ORAL_TABLET | ORAL | 0 refills | Status: DC

## 2016-09-07 NOTE — Addendum Note (Signed)
Addended by: Kathrynn Ducking on: 09/07/2016 11:13 AM   Modules accepted: Orders

## 2016-09-07 NOTE — Telephone Encounter (Signed)
Patient calling to discuss tapering off medication levETIRAcetam (KEPPRA) 1000 MG tablet.

## 2016-09-07 NOTE — Telephone Encounter (Signed)
I called the patient. He has done well off of the Dilantin, no recurring seizures. I will initiate a taper off of the Keppra going down by 500 mg every 2 weeks until off the medication. He will call me if there are any concerns.

## 2016-09-18 ENCOUNTER — Other Ambulatory Visit: Payer: Self-pay | Admitting: Emergency Medicine

## 2016-10-17 ENCOUNTER — Other Ambulatory Visit: Payer: Self-pay | Admitting: Neurology

## 2016-11-11 ENCOUNTER — Encounter: Payer: Self-pay | Admitting: Emergency Medicine

## 2016-11-11 ENCOUNTER — Ambulatory Visit (INDEPENDENT_AMBULATORY_CARE_PROVIDER_SITE_OTHER): Payer: Medicare Other | Admitting: Emergency Medicine

## 2016-11-11 VITALS — BP 165/79 | HR 113 | Temp 98.4°F | Resp 16 | Ht 70.0 in | Wt 166.2 lb

## 2016-11-11 DIAGNOSIS — I10 Essential (primary) hypertension: Secondary | ICD-10-CM

## 2016-11-11 MED ORDER — AMLODIPINE BESYLATE 10 MG PO TABS: 10.0000 mg | ORAL_TABLET | Freq: Every day | ORAL | 3 refills | Status: DC

## 2016-11-11 MED ORDER — HYDROCHLOROTHIAZIDE 12.5 MG PO CAPS: 12.5000 mg | ORAL_CAPSULE | Freq: Every day | ORAL | 3 refills | Status: DC

## 2016-11-11 MED ORDER — LISINOPRIL 40 MG PO TABS: 40.0000 mg | ORAL_TABLET | Freq: Every day | ORAL | 3 refills | Status: DC

## 2016-11-11 MED ORDER — ATORVASTATIN CALCIUM 40 MG PO TABS: 40.0000 mg | ORAL_TABLET | Freq: Every day | ORAL | 3 refills | Status: DC

## 2016-11-11 NOTE — Progress Notes (Addendum)
Martin Edwards 71 y.o.   Chief Complaint  Patient presents with  . Medication Refill    per patient, needs several refills    HISTORY OF PRESENT ILLNESS: This is a 71 y.o. male needs medication refills for HTN and dyslipidemia. Doing well and has no concerns.  HPI   Prior to Admission medications   Medication Sig Start Date End Date Taking? Authorizing Provider  amLODipine (NORVASC) 10 MG tablet TAKE 1 TABLET BY MOUTH  DAILY 09/18/16  Yes Tereasa Coop, PA-C  atorvastatin (LIPITOR) 40 MG tablet TAKE 1 TABLET BY MOUTH  DAILY 09/18/16  Yes Tereasa Coop, PA-C  hydrochlorothiazide (MICROZIDE) 12.5 MG capsule TAKE 1 CAPSULE BY MOUTH  DAILY 09/18/16  Yes Tereasa Coop, PA-C  lisinopril (PRINIVIL,ZESTRIL) 40 MG tablet TAKE 1 TABLET BY MOUTH  DAILY 09/18/16  Yes Tereasa Coop, PA-C  HYDROcodone-acetaminophen (NORCO/VICODIN) 5-325 MG tablet Take 1-2 tablets by mouth every 4 (four) hours as needed for moderate pain or severe pain. Patient not taking: Reported on 11/11/2016 04/14/16   Autumn Messing III, MD  levETIRAcetam (KEPPRA) 250 MG tablet Take 3 tablets twice daily for 2 weeks, then take 2 tablets twice daily for 2 weeks, then take 1 tablet twice daily for 2 weeks, then stop Patient not taking: Reported on 11/11/2016 09/07/16   Kathrynn Ducking, MD    Allergies  Allergen Reactions  . Other Rash    Needs Hypoallergenic Sheets/ Laundry Cart had questionable allergic reaction to detergent last time he was here.    Patient Active Problem List   Diagnosis Date Noted  . History of cranioplasty 03/07/2015  . Seizures ([address]) 11/27/2014  . Abnormal LFTs 11/27/2014  . UTI (urinary tract infection), bacterial 11/27/2014  . 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 Left-sided sensory deficit present 11/19/2014  . Traumatic subdural bleed with LOC of 6 hours to 24 hours ([place]) 11/18/2014  . Subdural hematoma ([place]) 11/11/2014  . Small bowel obstruction due to adhesions ([place]) 01/30/2014  . Sebaceous  cyst x2 01/04/2014  . Prostate cancer ([place]) 12/12/2012  . Hypertension 10/21/2011  . Hyperlipidemia 10/21/2011  . Adenomatous colon polyp 10/21/2011  . Postop check 12/29/2010    Past Medical History:  Diagnosis Date  . Basal cell carcinoma of face X 2  . GERD (gastroesophageal reflux disease)   . History of kidney stones   . Hyperlipidemia   . Hypertension   . Prostate cancer ([place]) 1999  . Seizures ([place]) 11/2014   brain trauma -hematoma  . Small bowel obstruction due to adhesions ([place]) 01/30/2014  . Subdural hematoma [facility])     Past Surgical History:  Procedure Laterality Date  . BASAL CELL CARCINOMA EXCISION  X 2   "off my face both times"  . COLECTOMY  10/2010   "took out 8 inches of colon; wasn't cancer"  . CRANIOTOMY Right 11/11/2014   Procedure: CRANIOTOMY HEMATOMA EVACUATION SUBDURAL;  Surgeon: Leeroy Cha, MD;  Location: [place] [facility]UR[facility] ORS;  Service: Neurosurgery;  Laterality: Right;  . CRANIOTOMY Right 11/13/2014   Procedure: Removal bone flap, insertion into the abdominal wall;  Surgeon: Leeroy Cha, MD;  Location: [place] NEUR[facility] ORS;  Service: Neurosurgery;  Laterality: Right;  . CRANIOTOMY N/A 03/07/2015   Procedure: Cranioplasty using Bone flap in abdomen;  Surgeon: Leeroy Cha, MD;  Location: [place] NEUR[facility] ORS;  Service: Neurosurgery;  Laterality: N/A;  Cranioplasty using Bone flap in abdomen  . EXCISION MASS NECK Right 04/14/2016   Procedure: EXCISION BIOPSY OF RIGHT NECK  LYMPH NODE;  Surgeon: Jerrell Belfast, MD;  Location: [place];  Service: ENT;  Laterality: Right;  . EXCISION MELANOMA WITH SENTINEL LYMPH NODE BIOPSY Right 04/14/2016   Procedure: WIDE EXCISION MELANOMA RIGHT SHOULDER WITH SENTINEL  NODE MAPPING;  Surgeon: Autumn Messing III, MD;  Location: [place];  Service: General;  Laterality: Right;  . PROSTATECTOMY  05/20/1998    Social History   Social History  . Marital status: Divorced    Spouse name: N/A  . Number of children: 2  . Years of education: 12    Occupational History  . retired    Social History Main Topics  . Smoking status: Former Research scientist (life sciences)  . Smokeless tobacco: Never Used  . Alcohol use No     Comment: 01/30/2014 "maybe a beer twice/yr"  . Drug use: No  . Sexual activity: Not Currently   Other Topics Concern  . Not on file   Social History Narrative   Exercise: 2-3 times a wk      Patient drinks 2 cups of caffeine daily.   Patient is right handed.    Family History  Problem Relation Age of Onset  . Cancer Father        Lung     Review of Systems  Constitutional: Negative.  Negative for chills, fever, malaise/fatigue and weight loss.  HENT: Negative.  Negative for hearing loss, nosebleeds and sore throat.   Eyes: Negative.  Negative for blurred vision and double vision.  Respiratory: Negative.  Negative for cough, shortness of breath and wheezing.   Cardiovascular: Negative.  Negative for chest pain, claudication and leg swelling.  Gastrointestinal: Negative.  Negative for abdominal pain, diarrhea, nausea and vomiting.  Genitourinary: Negative for dysuria and hematuria.  Musculoskeletal: Negative for myalgias and neck pain.  Skin: Negative.  Negative for rash.  Neurological: Negative.  Negative for dizziness and headaches.  Endo/Heme/Allergies: Negative.   All other systems reviewed and are negative.  Vitals:   11/11/16 1100  BP: (!) 165/79  Pulse: (!) 113  Resp: 16  Temp: 98.4 F (36.9 C)     Physical Exam  Constitutional: He is oriented to person, place, and time. He appears well-developed and well-nourished.  HENT:  Head: Normocephalic and atraumatic.  Nose: Nose normal.  Mouth/Throat: Oropharynx is clear and moist. No oropharyngeal exudate.  Eyes: Conjunctivae and EOM are normal. Pupils are equal, round, and reactive to light.  Neck: Normal range of motion. Neck supple. No JVD present. No thyromegaly present.  Cardiovascular: Normal rate, regular rhythm and normal heart sounds.    Pulmonary/Chest: Effort normal and breath sounds normal.  Abdominal: Soft. Bowel sounds are normal. He exhibits no distension. There is no tenderness.  Musculoskeletal: Normal range of motion.  Lymphadenopathy:    He has no cervical adenopathy.  Neurological: He is alert and oriented to person, place, and time. No sensory deficit. He exhibits normal muscle tone.  Skin: Skin is warm and dry. No rash noted.  Psychiatric: He has a normal mood and affect. His behavior is normal.  Vitals reviewed.    ASSESSMENT & PLAN: Martin Edwards was seen today for medication refill.  Diagnoses and all orders for this visit:  Essential hypertension  Other orders -     amLODipine (NORVASC) 10 MG tablet; Take 1 tablet (10 mg total) by mouth daily. -     atorvastatin (LIPITOR) 40 MG tablet; Take 1 tablet (40 mg total) by mouth daily. -     hydrochlorothiazide (MICROZIDE) 12.5 MG capsule; Take  1 capsule (12.5 mg total) by mouth daily. -     lisinopril (PRINIVIL,ZESTRIL) 40 MG tablet; Take 1 tablet (40 mg total) by mouth daily.    Patient Instructions       IF you received an x-ray today, you will receive an invoice from [facility]. Please contact [facility] at [phone] with questions or concerns regarding your invoice.   IF you received labwork today, you will receive an invoice from [facility]. Please contact [facility] at [phone] with questions or concerns regarding your invoice.   Our billing staff will not be able to assist you with questions regarding bills from these companies.  You will be contacted with the lab results as soon as they are available. The fastest way to get your results is to activate your My Chart account. Instructions are located on the last page of this paperwork. If you have not heard from [place] regarding the results in 2 weeks, please contact this office.     Hypertension Hypertension is another name for high blood pressure. High blood pressure forces  your heart to work harder to pump blood. This can cause problems over time. There are two numbers in a blood pressure reading. There is a top number (systolic) over a bottom number (diastolic). It is best to have a blood pressure below 120/80. Healthy choices can help lower your blood pressure. You may need medicine to help lower your blood pressure if:  Your blood pressure cannot be lowered with healthy choices.  Your blood pressure is higher than 130/80.  Follow these instructions at home: Eating and drinking  If directed, follow the DASH eating plan. This diet includes: ? Filling half of your plate at each meal with fruits and vegetables. ? Filling one quarter of your plate at each meal with whole grains. Whole grains include whole wheat pasta, brown rice, and whole grain bread. ? Eating or drinking low-fat dairy products, such as skim milk or low-fat yogurt. ? Filling one quarter of your plate at each meal with low-fat (lean) proteins. Low-fat proteins include fish, skinless chicken, eggs, beans, and tofu. ? Avoiding fatty meat, cured and processed meat, or chicken with skin. ? Avoiding premade or processed food.  Eat less than 1,500 mg of salt (sodium) a day.  Limit alcohol use to no more than 1 drink a day for nonpregnant women and 2 drinks a day for men. One drink equals 12 oz of beer, 5 oz of wine, or 1 oz of hard liquor. Lifestyle  Work with your doctor to stay at a healthy weight or to lose weight. Ask your doctor what the best weight is for you.  Get at least 30 minutes of exercise that causes your heart to beat faster (aerobic exercise) most days of the week. This may include walking, swimming, or biking.  Get at least 30 minutes of exercise that strengthens your muscles (resistance exercise) at least 3 days a week. This may include lifting weights or pilates.  Do not use any products that contain nicotine or tobacco. This includes cigarettes and e-cigarettes. If you need  help quitting, ask your doctor.  Check your blood pressure at home as told by your doctor.  Keep all follow-up visits as told by your doctor. This is important. Medicines  Take over-the-counter and prescription medicines only as told by your doctor. Follow directions carefully.  Do not skip doses of blood pressure medicine. The medicine does not work as well if you skip doses. Skipping doses also  puts you at risk for problems.  Ask your doctor about side effects or reactions to medicines that you should watch for. Contact a doctor if:  You think you are having a reaction to the medicine you are taking.  You have headaches that keep coming back (recurring).  You feel dizzy.  You have swelling in your ankles.  You have trouble with your vision. Get help right away if:  You get a very bad headache.  You start to feel confused.  You feel weak or numb.  You feel faint.  You get very bad pain in your: ? Chest. ? Belly (abdomen).  You throw up (vomit) more than once.  You have trouble breathing. Summary  Hypertension is another name for high blood pressure.  Making healthy choices can help lower blood pressure. If your blood pressure cannot be controlled with healthy choices, you may need to take medicine. This information is not intended to replace advice given to you by your health care provider. Make sure you discuss any questions you have with your health care provider. Document Released: 11/17/2007 Document Revised: 04/28/2016 Document Reviewed: 04/28/2016 Elsevier Interactive Patient Education  2018 [employer].      Agustina Caroli, MD [facility]

## 2016-11-11 NOTE — Patient Instructions (Addendum)
   IF you received an x-ray today, you will receive an invoice from [facility]. Please contact [facility] at [phone] with questions or concerns regarding your invoice.   IF you received labwork today, you will receive an invoice from [facility]. Please contact [facility] at [phone] with questions or concerns regarding your invoice.   Our billing staff will not be able to assist you with questions regarding bills from these companies.  You will be contacted with the lab results as soon as they are available. The fastest way to get your results is to activate your My Chart account. Instructions are located on the last page of this paperwork. If you have not heard from us regarding the results in 2 weeks, please contact this office.     Hypertension Hypertension is another name for high blood pressure. High blood pressure forces your heart to work harder to pump blood. This can cause problems over time. There are two numbers in a blood pressure reading. There is a top number (systolic) over a bottom number (diastolic). It is best to have a blood pressure below 120/80. Healthy choices can help lower your blood pressure. You may need medicine to help lower your blood pressure if:  Your blood pressure cannot be lowered with healthy choices.  Your blood pressure is higher than 130/80.  Follow these instructions at home: Eating and drinking  If directed, follow the DASH eating plan. This diet includes: ? Filling half of your plate at each meal with fruits and vegetables. ? Filling one quarter of your plate at each meal with whole grains. Whole grains include whole wheat pasta, brown rice, and whole grain bread. ? Eating or drinking low-fat dairy products, such as skim milk or low-fat yogurt. ? Filling one quarter of your plate at each meal with low-fat (lean) proteins. Low-fat proteins include fish, skinless chicken, eggs, beans, and tofu. ? Avoiding fatty meat, cured  and processed meat, or chicken with skin. ? Avoiding premade or processed food.  Eat less than 1,500 mg of salt (sodium) a day.  Limit alcohol use to no more than 1 drink a day for nonpregnant women and 2 drinks a day for men. One drink equals 12 oz of beer, 5 oz of wine, or 1 oz of hard liquor. Lifestyle  Work with your doctor to stay at a healthy weight or to lose weight. Ask your doctor what the best weight is for you.  Get at least 30 minutes of exercise that causes your heart to beat faster (aerobic exercise) most days of the week. This may include walking, swimming, or biking.  Get at least 30 minutes of exercise that strengthens your muscles (resistance exercise) at least 3 days a week. This may include lifting weights or pilates.  Do not use any products that contain nicotine or tobacco. This includes cigarettes and e-cigarettes. If you need help quitting, ask your doctor.  Check your blood pressure at home as told by your doctor.  Keep all follow-up visits as told by your doctor. This is important. Medicines  Take over-the-counter and prescription medicines only as told by your doctor. Follow directions carefully.  Do not skip doses of blood pressure medicine. The medicine does not work as well if you skip doses. Skipping doses also puts you at risk for problems.  Ask your doctor about side effects or reactions to medicines that you should watch for. Contact a doctor if:  You think you are having a reaction to the   medicine you are taking.  You have headaches that keep coming back (recurring).  You feel dizzy.  You have swelling in your ankles.  You have trouble with your vision. Get help right away if:  You get a very bad headache.  You start to feel confused.  You feel weak or numb.  You feel faint.  You get very bad pain in your: ? Chest. ? Belly (abdomen).  You throw up (vomit) more than once.  You have trouble breathing. Summary  Hypertension is  another name for high blood pressure.  Making healthy choices can help lower blood pressure. If your blood pressure cannot be controlled with healthy choices, you may need to take medicine. This information is not intended to replace advice given to you by your health care provider. Make sure you discuss any questions you have with your health care provider. Document Released: 11/17/2007 Document Revised: 04/28/2016 Document Reviewed: 04/28/2016 Elsevier Interactive Patient Education  2018 [employer]  

## 2016-11-16 ENCOUNTER — Ambulatory Visit: Payer: Medicare Other | Admitting: Neurology

## 2016-12-06 ENCOUNTER — Encounter: Payer: Self-pay | Admitting: Neurology

## 2016-12-06 ENCOUNTER — Ambulatory Visit (INDEPENDENT_AMBULATORY_CARE_PROVIDER_SITE_OTHER): Payer: Medicare Other | Admitting: Neurology

## 2016-12-06 VITALS — BP 159/80 | HR 87 | Ht 70.0 in | Wt 166.5 lb

## 2016-12-06 DIAGNOSIS — R569 Unspecified convulsions: Secondary | ICD-10-CM | POA: Diagnosis not present
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
## 2017-02-09 DIAGNOSIS — D225 Melanocytic nevi of trunk: Secondary | ICD-10-CM | POA: Diagnosis not present

## 2017-02-09 DIAGNOSIS — L821 Other seborrheic keratosis: Secondary | ICD-10-CM | POA: Diagnosis not present

## 2017-02-09 DIAGNOSIS — L570 Actinic keratosis: Secondary | ICD-10-CM | POA: Diagnosis not present

## 2017-02-09 DIAGNOSIS — D1801 Hemangioma of skin and subcutaneous tissue: Secondary | ICD-10-CM | POA: Diagnosis not present

## 2017-02-09 DIAGNOSIS — Z85828 Personal history of other malignant neoplasm of skin: Secondary | ICD-10-CM | POA: Diagnosis not present

## 2017-05-13 ENCOUNTER — Encounter: Payer: Self-pay | Admitting: Emergency Medicine

## 2017-05-13 ENCOUNTER — Other Ambulatory Visit: Payer: Self-pay

## 2017-05-13 ENCOUNTER — Ambulatory Visit (INDEPENDENT_AMBULATORY_CARE_PROVIDER_SITE_OTHER): Payer: Medicare Other | Admitting: Emergency Medicine

## 2017-05-13 VITALS — BP 128/66 | HR 104 | Temp 98.7°F | Resp 16 | Ht 69.25 in | Wt 166.0 lb

## 2017-05-13 DIAGNOSIS — I10 Essential (primary) hypertension: Secondary | ICD-10-CM

## 2017-05-13 DIAGNOSIS — E785 Hyperlipidemia, unspecified: Secondary | ICD-10-CM | POA: Diagnosis not present

## 2017-05-13 DIAGNOSIS — Z0001 Encounter for general adult medical examination with abnormal findings: Secondary | ICD-10-CM

## 2017-05-13 DIAGNOSIS — R222 Localized swelling, mass and lump, trunk: Secondary | ICD-10-CM

## 2017-05-13 NOTE — Patient Instructions (Addendum)
   IF you received an x-ray today, you will receive an invoice from [facility]. Please contact [facility] at [phone] with questions or concerns regarding your invoice.   IF you received labwork today, you will receive an invoice from [facility]. Please contact [facility] at [phone] with questions or concerns regarding your invoice.   Our billing staff will not be able to assist you with questions regarding bills from these companies.  You will be contacted with the lab results as soon as they are available. The fastest way to get your results is to activate your My Chart account. Instructions are located on the last page of this paperwork. If you have not heard from us regarding the results in 2 weeks, please contact this office.     Health Maintenance, Male A healthy lifestyle and preventive care is important for your health and wellness. Ask your health care provider about what schedule of regular examinations is right for you. What should I know about weight and diet? Eat a Healthy Diet  Eat plenty of vegetables, fruits, whole grains, low-fat dairy products, and lean protein.  Do not eat a lot of foods high in solid fats, added sugars, or salt.  Maintain a Healthy Weight Regular exercise can help you achieve or maintain a healthy weight. You should:  Do at least 150 minutes of exercise each week. The exercise should increase your heart rate and make you sweat (moderate-intensity exercise).  Do strength-training exercises at least twice a week.  Watch Your Levels of Cholesterol and Blood Lipids  Have your blood tested for lipids and cholesterol every 5 years starting at 72 years of age. If you are at high risk for heart disease, you should start having your blood tested when you are 72 years old. You may need to have your cholesterol levels checked more often if: ? Your lipid or cholesterol levels are high. ? You are older than 72 years of age. ? You  are at high risk for heart disease.  What should I know about cancer screening? Many types of cancers can be detected early and may often be prevented. Lung Cancer  You should be screened every year for lung cancer if: ? You are a current smoker who has smoked for at least 30 years. ? You are a former smoker who has quit within the past 15 years.  Talk to your health care provider about your screening options, when you should start screening, and how often you should be screened.  Colorectal Cancer  Routine colorectal cancer screening usually begins at 72 years of age and should be repeated every 5-10 years until you are 72 years old. You may need to be screened more often if early forms of precancerous polyps or small growths are found. Your health care provider may recommend screening at an earlier age if you have risk factors for colon cancer.  Your health care provider may recommend using home test kits to check for hidden blood in the stool.  A small camera at the end of a tube can be used to examine your colon (sigmoidoscopy or colonoscopy). This checks for the earliest forms of colorectal cancer.  Prostate and Testicular Cancer  Depending on your age and overall health, your health care provider may do certain tests to screen for prostate and testicular cancer.  Talk to your health care provider about any symptoms or concerns you have about testicular or prostate cancer.  Skin Cancer  Check your skin from   head to toe regularly.  Tell your health care provider about any new moles or changes in moles, especially if: ? There is a change in a mole's size, shape, or color. ? You have a mole that is larger than a pencil eraser.  Always use sunscreen. Apply sunscreen liberally and repeat throughout the day.  Protect yourself by wearing long sleeves, pants, a wide-brimmed hat, and sunglasses when outside.  What should I know about heart disease, diabetes, and high blood  pressure?  If you are 18-39 years of age, have your blood pressure checked every 3-5 years. If you are 40 years of age or older, have your blood pressure checked every year. You should have your blood pressure measured twice-once when you are at a hospital or clinic, and once when you are not at a hospital or clinic. Record the average of the two measurements. To check your blood pressure when you are not at a hospital or clinic, you can use: ? An automated blood pressure machine at a pharmacy. ? A home blood pressure monitor.  Talk to your health care provider about your target blood pressure.  If you are between 45-79 years old, ask your health care provider if you should take aspirin to prevent heart disease.  Have regular diabetes screenings by checking your fasting blood sugar level. ? If you are at a normal weight and have a low risk for diabetes, have this test once every three years after the age of 45. ? If you are overweight and have a high risk for diabetes, consider being tested at a younger age or more often.  A one-time screening for abdominal aortic aneurysm (AAA) by ultrasound is recommended for men aged 65-75 years who are current or former smokers. What should I know about preventing infection? Hepatitis B If you have a higher risk for hepatitis B, you should be screened for this virus. Talk with your health care provider to find out if you are at risk for hepatitis B infection. Hepatitis C Blood testing is recommended for:  Everyone born from 1945 through 1965.  Anyone with known risk factors for hepatitis C.  Sexually Transmitted Diseases (STDs)  You should be screened each year for STDs including gonorrhea and chlamydia if: ? You are sexually active and are younger than 72 years of age. ? You are older than 72 years of age and your health care provider tells you that you are at risk for this type of infection. ? Your sexual activity has changed since you were last  screened and you are at an increased risk for chlamydia or gonorrhea. Ask your health care provider if you are at risk.  Talk with your health care provider about whether you are at high risk of being infected with HIV. Your health care provider may recommend a prescription medicine to help prevent HIV infection.  What else can I do?  Schedule regular health, dental, and eye exams.  Stay current with your vaccines (immunizations).  Do not use any tobacco products, such as cigarettes, chewing tobacco, and e-cigarettes. If you need help quitting, ask your health care provider.  Limit alcohol intake to no more than 2 drinks per day. One drink equals 12 ounces of beer, 5 ounces of wine, or 1 ounces of hard liquor.  Do not use street drugs.  Do not share needles.  Ask your health care provider for help if you need support or information about quitting drugs.  Tell your health care provider   if you often feel depressed.  Tell your health care provider if you have ever been abused or do not feel safe at home. This information is not intended to replace advice given to you by your health care provider. Make sure you discuss any questions you have with your health care provider. Document Released: 11/27/2007 Document Revised: 01/28/2016 Document Reviewed: 03/04/2015 Elsevier Interactive Patient Education  2018 [employer]  Hypertension Hypertension, commonly called high blood pressure, is when the force of blood pumping through the arteries is too strong. The arteries are the blood vessels that carry blood from the heart throughout the body. Hypertension forces the heart to work harder to pump blood and may cause arteries to become narrow or stiff. Having untreated or uncontrolled hypertension can cause heart attacks, strokes, kidney disease, and other problems. A blood pressure reading consists of a higher number over a lower number. Ideally, your blood pressure should be below 120/80. The  first ("top") number is called the systolic pressure. It is a measure of the pressure in your arteries as your heart beats. The second ("bottom") number is called the diastolic pressure. It is a measure of the pressure in your arteries as the heart relaxes. What are the causes? The cause of this condition is not known. What increases the risk? Some risk factors for high blood pressure are under your control. Others are not. Factors you can change  Smoking.  Having type 2 diabetes mellitus, high cholesterol, or both.  Not getting enough exercise or physical activity.  Being overweight.  Having too much fat, sugar, calories, or salt (sodium) in your diet.  Drinking too much alcohol. Factors that are difficult or impossible to change  Having chronic kidney disease.  Having a family history of high blood pressure.  Age. Risk increases with age.  Race. You may be at higher risk if you are African-American.  Gender. Men are at higher risk than women before age 45. After age 65, women are at higher risk than men.  Having obstructive sleep apnea.  Stress. What are the signs or symptoms? Extremely high blood pressure (hypertensive crisis) may cause:  Headache.  Anxiety.  Shortness of breath.  Nosebleed.  Nausea and vomiting.  Severe chest pain.  Jerky movements you cannot control (seizures).  How is this diagnosed? This condition is diagnosed by measuring your blood pressure while you are seated, with your arm resting on a surface. The cuff of the blood pressure monitor will be placed directly against the skin of your upper arm at the level of your heart. It should be measured at least twice using the same arm. Certain conditions can cause a difference in blood pressure between your right and left arms. Certain factors can cause blood pressure readings to be lower or higher than normal (elevated) for a short period of time:  When your blood pressure is higher when you are  in a health care provider's office than when you are at home, this is called white coat hypertension. Most people with this condition do not need medicines.  When your blood pressure is higher at home than when you are in a health care provider's office, this is called masked hypertension. Most people with this condition may need medicines to control blood pressure.  If you have a high blood pressure reading during one visit or you have normal blood pressure with other risk factors:  You may be asked to return on a different day to have your blood pressure checked   again.  You may be asked to monitor your blood pressure at home for 1 week or longer.  If you are diagnosed with hypertension, you may have other blood or imaging tests to help your health care provider understand your overall risk for other conditions. How is this treated? This condition is treated by making healthy lifestyle changes, such as eating healthy foods, exercising more, and reducing your alcohol intake. Your health care provider may prescribe medicine if lifestyle changes are not enough to get your blood pressure under control, and if:  Your systolic blood pressure is above 130.  Your diastolic blood pressure is above 80.  Your personal target blood pressure may vary depending on your medical conditions, your age, and other factors. Follow these instructions at home: Eating and drinking  Eat a diet that is high in fiber and potassium, and low in sodium, added sugar, and fat. An example eating plan is called the DASH (Dietary Approaches to Stop Hypertension) diet. To eat this way: ? Eat plenty of fresh fruits and vegetables. Try to fill half of your plate at each meal with fruits and vegetables. ? Eat whole grains, such as whole wheat pasta, brown rice, or whole grain bread. Fill about one quarter of your plate with whole grains. ? Eat or drink low-fat dairy products, such as skim milk or low-fat yogurt. ? Avoid fatty  cuts of meat, processed or cured meats, and poultry with skin. Fill about one quarter of your plate with lean proteins, such as fish, chicken without skin, beans, eggs, and tofu. ? Avoid premade and processed foods. These tend to be higher in sodium, added sugar, and fat.  Reduce your daily sodium intake. Most people with hypertension should eat less than 1,500 mg of sodium a day.  Limit alcohol intake to no more than 1 drink a day for nonpregnant women and 2 drinks a day for men. One drink equals 12 oz of beer, 5 oz of wine, or 1 oz of hard liquor. Lifestyle  Work with your health care provider to maintain a healthy body weight or to lose weight. Ask what an ideal weight is for you.  Get at least 30 minutes of exercise that causes your heart to beat faster (aerobic exercise) most days of the week. Activities may include walking, swimming, or biking.  Include exercise to strengthen your muscles (resistance exercise), such as pilates or lifting weights, as part of your weekly exercise routine. Try to do these types of exercises for 30 minutes at least 3 days a week.  Do not use any products that contain nicotine or tobacco, such as cigarettes and e-cigarettes. If you need help quitting, ask your health care provider.  Monitor your blood pressure at home as told by your health care provider.  Keep all follow-up visits as told by your health care provider. This is important. Medicines  Take over-the-counter and prescription medicines only as told by your health care provider. Follow directions carefully. Blood pressure medicines must be taken as prescribed.  Do not skip doses of blood pressure medicine. Doing this puts you at risk for problems and can make the medicine less effective.  Ask your health care provider about side effects or reactions to medicines that you should watch for. Contact a health care provider if:  You think you are having a reaction to a medicine you are  taking.  You have headaches that keep coming back (recurring).  You feel dizzy.  You have swelling in your   ankles.  You have trouble with your vision. Get help right away if:  You develop a severe headache or confusion.  You have unusual weakness or numbness.  You feel faint.  You have severe pain in your chest or abdomen.  You vomit repeatedly.  You have trouble breathing. Summary  Hypertension is when the force of blood pumping through your arteries is too strong. If this condition is not controlled, it may put you at risk for serious complications.  Your personal target blood pressure may vary depending on your medical conditions, your age, and other factors. For most people, a normal blood pressure is less than 120/80.  Hypertension is treated with lifestyle changes, medicines, or a combination of both. Lifestyle changes include weight loss, eating a healthy, low-sodium diet, exercising more, and limiting alcohol. This information is not intended to replace advice given to you by your health care provider. Make sure you discuss any questions you have with your health care provider. Document Released: 05/31/2005 Document Revised: 04/28/2016 Document Reviewed: 04/28/2016 [employer] Interactive Patient Education  2018 [employer]  

## 2017-05-13 NOTE — Progress Notes (Signed)
Martin Edwards 71 y.o.   Chief Complaint  Patient presents with  . Annual Exam    HISTORY OF PRESENT ILLNESS: This is a 71 y.o. male Here for annual exam; no complaints and no medical concerns. However has noticed lump to right lower abdomen x 2 days. Otherwise doing well; denies fever,chills, n/v, or rectal bleeding. Marland Kitchen  HPI   Prior to Admission medications   Medication Sig Start Date End Date Taking? Authorizing Provider  amLODipine (NORVASC) 10 MG tablet Take 1 tablet (10 mg total) by mouth daily. 11/11/16  Yes Blanchard Willhite, Ines Bloomer, MD  atorvastatin (LIPITOR) 40 MG tablet Take 1 tablet (40 mg total) by mouth daily. 11/11/16  Yes Loren Sawaya, Ines Bloomer, MD  hydrochlorothiazide (MICROZIDE) 12.5 MG capsule Take 1 capsule (12.5 mg total) by mouth daily. 11/11/16  Yes Cheyan Frees, Ines Bloomer, MD  lisinopril (PRINIVIL,ZESTRIL) 40 MG tablet Take 1 tablet (40 mg total) by mouth daily. 11/11/16  Yes Horald Pollen, MD    Allergies  Allergen Reactions  . Other Rash    Needs Hypoallergenic Sheets/ Laundry Cart had questionable allergic reaction to detergent last time he was here.    Patient Active Problem List   Diagnosis Date Noted  . History of cranioplasty 03/07/2015  . Seizures ([place]) 11/27/2014  . Abnormal LFTs 11/27/2014  . UTI (urinary tract infection), bacterial 11/27/2014  . 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 Left-sided sensory deficit present 11/19/2014  . Traumatic subdural bleed with LOC of 6 hours to 24 hours ([place]) 11/18/2014  . Subdural hematoma ([place]) 11/11/2014  . Small bowel obstruction due to adhesions ([place]) 01/30/2014  . Sebaceous cyst x2 01/04/2014  . Prostate cancer ([place]) 12/12/2012  . Hypertension 10/21/2011  . Hyperlipidemia 10/21/2011  . Adenomatous colon polyp 10/21/2011  . Postop check 12/29/2010    Past Medical History:  Diagnosis Date  . Basal cell carcinoma of face X 2  . GERD (gastroesophageal reflux disease)   . History of kidney stones   .  Hyperlipidemia   . Hypertension   . Prostate cancer ([place]) 1999  . 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 Small bowel obstruction due to adhesions ([place]) 01/30/2014  . Subdural hematoma [facility])     Past Surgical History:  Procedure Laterality Date  . BASAL CELL CARCINOMA EXCISION  X 2   "off my face both times"  . COLECTOMY  10/2010   "took out 8 inches of colon; wasn't cancer"  . CRANIOTOMY Right 11/11/2014   Procedure: CRANIOTOMY HEMATOMA EVACUATION SUBDURAL;  Surgeon: Leeroy Cha, MD;  Location: [facility];  Service: Neurosurgery;  Laterality: Right;  . CRANIOTOMY Right 11/13/2014   Procedure: Removal bone flap, insertion into the abdominal wall;  Surgeon: Leeroy Cha, MD;  Location: [facility];  Service: Neurosurgery;  Laterality: Right;  . CRANIOTOMY N/A 03/07/2015   Procedure: Cranioplasty using Bone flap in abdomen;  Surgeon: Leeroy Cha, MD;  Location: [facility];  Service: Neurosurgery;  Laterality: N/A;  Cranioplasty using Bone flap in abdomen  . EXCISION MASS NECK Right 04/14/2016   Procedure: EXCISION BIOPSY OF RIGHT NECK LYMPH NODE;  Surgeon: Jerrell Belfast, MD;  Location: [facility];  Service: ENT;  Laterality: Right;  . EXCISION MELANOMA WITH SENTINEL LYMPH NODE BIOPSY Right 04/14/2016   Procedure: WIDE EXCISION MELANOMA RIGHT SHOULDER WITH SENTINEL  NODE MAPPING;  Surgeon: Autumn Messing III, MD;  Location: [facility];  Service: General;  Laterality: Right;  . PROSTATECTOMY  05/20/1998  Social History   Socioeconomic History  . Marital status: Divorced    Spouse name: Not on file  . Number of children: 2  . Years of education: 76  . Highest education level: Not on file  Social Needs  . Financial resource strain: Not on file  . Food insecurity - worry: Not on file  . Food insecurity - inability: Not on file  . Transportation needs - medical: Not on file  . Transportation needs - non-medical: Not on file  Occupational History  . Occupation: retired  Tobacco  Use  . Smoking status: Former Research scientist (life sciences)  . Smokeless tobacco: Never Used  Substance and Sexual Activity  . Alcohol use: No    Alcohol/week: 0.0 oz    Comment: 01/30/2014 "maybe a beer twice/yr"  . Drug use: No  . Sexual activity: Not Currently  Other Topics Concern  . Not on file  Social History Narrative   Exercise: 2-3 times a wk      Patient drinks 2 cups of caffeine daily.   Patient is right handed.    Family History  Problem Relation Age of Onset  . Cancer Father        Lung     Review of Systems  Constitutional: Negative.  Negative for chills, fever and malaise/fatigue.  HENT: Negative.  Negative for congestion, ear pain, nosebleeds and sore throat.   Eyes: Negative.  Negative for blurred vision and double vision.  Respiratory: Negative.  Negative for cough, hemoptysis and shortness of breath.   Cardiovascular: Negative.  Negative for chest pain and palpitations.  Gastrointestinal: Negative.  Negative for abdominal pain, blood in stool, diarrhea, nausea and vomiting.  Genitourinary: Negative.  Negative for dysuria and hematuria.  Musculoskeletal: Negative.  Negative for back pain, joint pain, myalgias and neck pain.  Skin: Negative for rash.  Neurological: Negative.  Negative for dizziness, sensory change, focal weakness, weakness and headaches.  Endo/Heme/Allergies: Negative.     Vitals:   05/13/17 0852  BP: 128/66  Pulse: (!) 104  Resp: 16  Temp: 98.7 F (37.1 C)  SpO2: 99%    Physical Exam  Constitutional: He is oriented to person, place, and time. He appears well-developed and well-nourished.  HENT:  Head: Normocephalic and atraumatic.  Nose: Nose normal.  Mouth/Throat: Oropharynx is clear and moist.  Eyes: Conjunctivae and EOM are normal. Pupils are equal, round, and reactive to light.  Neck: Normal range of motion. Neck supple. No JVD present.  Cardiovascular: Normal rate, regular rhythm, normal heart sounds and intact distal pulses.  Pulmonary/Chest:  Effort normal and breath sounds normal.  Abdominal: Soft. Bowel sounds are normal. He exhibits no distension. There is no tenderness. There is no rebound. Hernia confirmed negative in the right inguinal area and confirmed negative in the left inguinal area.    Genitourinary: Testes normal. Uncircumcised.  Musculoskeletal: Normal range of motion.  Lymphadenopathy:    He has no cervical adenopathy. No inguinal adenopathy noted on the right or left side.  Neurological: He is alert and oriented to person, place, and time. No sensory deficit. He exhibits normal muscle tone.  Skin: Skin is warm and dry. Capillary refill takes less than 2 seconds.  Psychiatric: He has a normal mood and affect. His behavior is normal.  Vitals reviewed.  NSR with VR85 No acute ischemic changes PR124 QRS96 compared with 01/08/16 no changes.   ASSESSMENT & PLAN: Sirius was seen today for annual exam.  Diagnoses and all orders for this visit:  Encounter  for general adult medical examination with abnormal findings -     CBC with Differential -     Comprehensive metabolic panel -     Lipid panel -     PSA  Essential hypertension  Hyperlipidemia, unspecified hyperlipidemia type  Abdominal wall lump Comments: r/o hernia Orders: -     US Abdomen Complete; Future    Patient Instructions       IF you received an x-ray today, you will receive an invoice from [facility]. Please contact [facility] at [phone] with questions or concerns regarding your invoice.   IF you received labwork today, you will receive an invoice from [facility]. Please contact [facility] at [phone] with questions or concerns regarding your invoice.   Our billing staff will not be able to assist you with questions regarding bills from these companies.  You will be contacted with the lab results as soon as they are available. The fastest way to get your results is to activate your My Chart account.  Instructions are located on the last page of this paperwork. If you have not heard from [place] regarding the results in 2 weeks, please contact this office.     Health Maintenance, Male A healthy lifestyle and preventive care is important for your health and wellness. Ask your health care provider about what schedule of regular examinations is right for you. What should I know about weight and diet? Eat a Healthy Diet  Eat plenty of vegetables, fruits, whole grains, low-fat dairy products, and lean protein.  Do not eat a lot of foods high in solid fats, added sugars, or salt.  Maintain a Healthy Weight Regular exercise can help you achieve or maintain a healthy weight. You should:  Do at least 150 minutes of exercise each week. The exercise should increase your heart rate and make you sweat (moderate-intensity exercise).  Do strength-training exercises at least twice a week.  Watch Your Levels of Cholesterol and Blood Lipids  Have your blood tested for lipids and cholesterol every 5 years starting at 72 years of age. If you are at high risk for heart disease, you should start having your blood tested when you are 72 years old. You may need to have your cholesterol levels checked more often if: ? Your lipid or cholesterol levels are high. ? You are older than 72 years of age. ? You are at high risk for heart disease.  What should I know about cancer screening? Many types of cancers can be detected early and may often be prevented. Lung Cancer  You should be screened every year for lung cancer if: ? You are a current smoker who has smoked for at least 30 years. ? You are a former smoker who has quit within the past 15 years.  Talk to your health care provider about your screening options, when you should start screening, and how often you should be screened.  Colorectal Cancer  Routine colorectal cancer screening usually begins at 72 years of age and should be repeated every 5-10  years until you are 72 years old. You may need to be screened more often if early forms of precancerous polyps or small growths are found. Your health care provider may recommend screening at an earlier age if you have risk factors for colon cancer.  Your health care provider may recommend using home test kits to check for hidden blood in the stool.  A small camera at the end of a tube can be used  to examine your colon (sigmoidoscopy or colonoscopy). This checks for the earliest forms of colorectal cancer.  Prostate and Testicular Cancer  Depending on your age and overall health, your health care provider may do certain tests to screen for prostate and testicular cancer.  Talk to your health care provider about any symptoms or concerns you have about testicular or prostate cancer.  Skin Cancer  Check your skin from head to toe regularly.  Tell your health care provider about any new moles or changes in moles, especially if: ? There is a change in a mole's size, shape, or color. ? You have a mole that is larger than a pencil eraser.  Always use sunscreen. Apply sunscreen liberally and repeat throughout the day.  Protect yourself by wearing long sleeves, pants, a wide-brimmed hat, and sunglasses when outside.  What should I know about heart disease, diabetes, and high blood pressure?  If you are 19-21 years of age, have your blood pressure checked every 3-5 years. If you are 62 years of age or older, have your blood pressure checked every year. You should have your blood pressure measured twice-once when you are at a hospital or clinic, and once when you are not at a hospital or clinic. Record the average of the two measurements. To check your blood pressure when you are not at a hospital or clinic, you can use: ? An automated blood pressure machine at a pharmacy. ? A home blood pressure monitor.  Talk to your health care provider about your target blood pressure.  If you are between  6-64 years old, ask your health care provider if you should take aspirin to prevent heart disease.  Have regular diabetes screenings by checking your fasting blood sugar level. ? If you are at a normal weight and have a low risk for diabetes, have this test once every three years after the age of 67. ? If you are overweight and have a high risk for diabetes, consider being tested at a younger age or more often.  A one-time screening for abdominal aortic aneurysm (AAA) by ultrasound is recommended for men aged 74-75 years who are current or former smokers. What should I know about preventing infection? Hepatitis B If you have a higher risk for hepatitis B, you should be screened for this virus. Talk with your health care provider to find out if you are at risk for hepatitis B infection. Hepatitis C Blood testing is recommended for:  Everyone born from [birth year] through 1966.  Anyone with known risk factors for hepatitis C.  Sexually Transmitted Diseases (STDs)  You should be screened each year for STDs including gonorrhea and chlamydia if: ? You are sexually active and are younger than 72 years of age. ? You are older than 72 years of age and your health care provider tells you that you are at risk for this type of infection. ? Your sexual activity has changed since you were last screened and you are at an increased risk for chlamydia or gonorrhea. Ask your health care provider if you are at risk.  Talk with your health care provider about whether you are at high risk of being infected with HIV. Your health care provider may recommend a prescription medicine to help prevent HIV infection.  What else can I do?  Schedule regular health, dental, and eye exams.  Stay current with your vaccines (immunizations).  Do not use any tobacco products, such as cigarettes, chewing tobacco, and e-cigarettes. If you need  help quitting, ask your health care provider.  Limit alcohol intake to no more than  2 drinks per day. One drink equals 12 ounces of beer, 5 ounces of wine, or 1 ounces of hard liquor.  Do not use street drugs.  Do not share needles.  Ask your health care provider for help if you need support or information about quitting drugs.  Tell your health care provider if you often feel depressed.  Tell your health care provider if you have ever been abused or do not feel safe at home. This information is not intended to replace advice given to you by your health care provider. Make sure you discuss any questions you have with your health care provider. Document Released: 11/27/2007 Document Revised: 01/28/2016 Document Reviewed: 03/04/2015 Elsevier Interactive Patient Education  2018 [employer].  Hypertension Hypertension, commonly called high blood pressure, is when the force of blood pumping through the arteries is too strong. The arteries are the blood vessels that carry blood from the heart throughout the body. Hypertension forces the heart to work harder to pump blood and may cause arteries to become narrow or stiff. Having untreated or uncontrolled hypertension can cause heart attacks, strokes, kidney disease, and other problems. A blood pressure reading consists of a higher number over a lower number. Ideally, your blood pressure should be below 120/80. The first ("top") number is called the systolic pressure. It is a measure of the pressure in your arteries as your heart beats. The second ("bottom") number is called the diastolic pressure. It is a measure of the pressure in your arteries as the heart relaxes. What are the causes? The cause of this condition is not known. What increases the risk? Some risk factors for high blood pressure are under your control. Others are not. Factors you can change  Smoking.  Having type 2 diabetes mellitus, high cholesterol, or both.  Not getting enough exercise or physical activity.  Being overweight.  Having too much fat,  sugar, calories, or salt (sodium) in your diet.  Drinking too much alcohol. Factors that are difficult or impossible to change  Having chronic kidney disease.  Having a family history of high blood pressure.  Age. Risk increases with age.  Race. You may be at higher risk if you are African-American.  Gender. Men are at higher risk than women before age 62. After age 22, women are at higher risk than men.  Having obstructive sleep apnea.  Stress. What are the signs or symptoms? Extremely high blood pressure (hypertensive crisis) may cause:  Headache.  Anxiety.  Shortness of breath.  Nosebleed.  Nausea and vomiting.  Severe chest pain.  Jerky movements you cannot control (seizures).  How is this diagnosed? This condition is diagnosed by measuring your blood pressure while you are seated, with your arm resting on a surface. The cuff of the blood pressure monitor will be placed directly against the skin of your upper arm at the level of your heart. It should be measured at least twice using the same arm. Certain conditions can cause a difference in blood pressure between your right and left arms. Certain factors can cause blood pressure readings to be lower or higher than normal (elevated) for a short period of time:  When your blood pressure is higher when you are in a health care provider's office than when you are at home, this is called white coat hypertension. Most people with this condition do not need medicines.  When your blood pressure  is higher at home than when you are in a health care provider's office, this is called masked hypertension. Most people with this condition may need medicines to control blood pressure.  If you have a high blood pressure reading during one visit or you have normal blood pressure with other risk factors:  You may be asked to return on a different day to have your blood pressure checked again.  You may be asked to monitor your blood  pressure at home for 1 week or longer.  If you are diagnosed with hypertension, you may have other blood or imaging tests to help your health care provider understand your overall risk for other conditions. How is this treated? This condition is treated by making healthy lifestyle changes, such as eating healthy foods, exercising more, and reducing your alcohol intake. Your health care provider may prescribe medicine if lifestyle changes are not enough to get your blood pressure under control, and if:  Your systolic blood pressure is above 130.  Your diastolic blood pressure is above 80.  Your personal target blood pressure may vary depending on your medical conditions, your age, and other factors. Follow these instructions at home: Eating and drinking  Eat a diet that is high in fiber and potassium, and low in sodium, added sugar, and fat. An example eating plan is called the DASH (Dietary Approaches to Stop Hypertension) diet. To eat this way: ? Eat plenty of fresh fruits and vegetables. Try to fill half of your plate at each meal with fruits and vegetables. ? Eat whole grains, such as whole wheat pasta, brown rice, or whole grain bread. Fill about one quarter of your plate with whole grains. ? Eat or drink low-fat dairy products, such as skim milk or low-fat yogurt. ? Avoid fatty cuts of meat, processed or cured meats, and poultry with skin. Fill about one quarter of your plate with lean proteins, such as fish, chicken without skin, beans, eggs, and tofu. ? Avoid premade and processed foods. These tend to be higher in sodium, added sugar, and fat.  Reduce your daily sodium intake. Most people with hypertension should eat less than 1,500 mg of sodium a day.  Limit alcohol intake to no more than 1 drink a day for nonpregnant women and 2 drinks a day for men. One drink equals 12 oz of beer, 5 oz of wine, or 1 oz of hard liquor. Lifestyle  Work with your health care provider to maintain a  healthy body weight or to lose weight. Ask what an ideal weight is for you.  Get at least 30 minutes of exercise that causes your heart to beat faster (aerobic exercise) most days of the week. Activities may include walking, swimming, or biking.  Include exercise to strengthen your muscles (resistance exercise), such as pilates or lifting weights, as part of your weekly exercise routine. Try to do these types of exercises for 30 minutes at least 3 days a week.  Do not use any products that contain nicotine or tobacco, such as cigarettes and e-cigarettes. If you need help quitting, ask your health care provider.  Monitor your blood pressure at home as told by your health care provider.  Keep all follow-up visits as told by your health care provider. This is important. Medicines  Take over-the-counter and prescription medicines only as told by your health care provider. Follow directions carefully. Blood pressure medicines must be taken as prescribed.  Do not skip doses of blood pressure medicine. Doing this  puts you at risk for problems and can make the medicine less effective.  Ask your health care provider about side effects or reactions to medicines that you should watch for. Contact a health care provider if:  You think you are having a reaction to a medicine you are taking.  You have headaches that keep coming back (recurring).  You feel dizzy.  You have swelling in your ankles.  You have trouble with your vision. Get help right away if:  You develop a severe headache or confusion.  You have unusual weakness or numbness.  You feel faint.  You have severe pain in your chest or abdomen.  You vomit repeatedly.  You have trouble breathing. Summary  Hypertension is when the force of blood pumping through your arteries is too strong. If this condition is not controlled, it may put you at risk for serious complications.  Your personal target blood pressure may vary  depending on your medical conditions, your age, and other factors. For most people, a normal blood pressure is less than 120/80.  Hypertension is treated with lifestyle changes, medicines, or a combination of both. Lifestyle changes include weight loss, eating a healthy, low-sodium diet, exercising more, and limiting alcohol. This information is not intended to replace advice given to you by your health care provider. Make sure you discuss any questions you have with your health care provider. Document Released: 05/31/2005 Document Revised: 04/28/2016 Document Reviewed: 04/28/2016 Elsevier Interactive Patient Education  2018 Elsevier Inc.      Agustina Caroli, MD [facility]

## 2017-05-14 LAB — COMPREHENSIVE METABOLIC PANEL
ALBUMIN: 4.8 g/dL (ref 3.5–4.8)
ALT: 16 IU/L (ref 0–44)
AST: 19 IU/L (ref 0–40)
Albumin/Globulin Ratio: 1.5 (ref 1.2–2.2)
Alkaline Phosphatase: 85 IU/L (ref 39–117)
BUN / CREAT RATIO: 11 (ref 10–24)
BUN: 12 mg/dL (ref 8–27)
Bilirubin Total: 0.8 mg/dL (ref 0.0–1.2)
CO2: 26 mmol/L (ref 20–29)
CREATININE: 1.1 mg/dL (ref 0.76–1.27)
Calcium: 9.8 mg/dL (ref 8.6–10.2)
Chloride: 95 mmol/L — ABNORMAL LOW (ref 96–106)
GFR calc non Af Amer: 67 mL/min/{1.73_m2} (ref >59)
GFR, EST AFRICAN AMERICAN: 78 mL/min/{1.73_m2} (ref >59)
GLUCOSE: 122 mg/dL — AB (ref 65–99)
Globulin, Total: 3.1 g/dL (ref 1.5–4.5)
Potassium: 4.9 mmol/L (ref 3.5–5.2)
Sodium: 136 mmol/L (ref 134–144)
TOTAL PROTEIN: 7.9 g/dL (ref 6.0–8.5)

## 2017-05-14 LAB — CBC WITH DIFFERENTIAL/PLATELET
Basophils Absolute: 0 10*3/uL (ref 0.0–0.2)
Basos: 0 %
EOS (ABSOLUTE): 0 10*3/uL (ref 0.0–0.4)
Eos: 0 %
HEMOGLOBIN: 15.3 g/dL (ref 13.0–17.7)
Hematocrit: 44.6 % (ref 37.5–51.0)
IMMATURE GRANS (ABS): 0 10*3/uL (ref 0.0–0.1)
Immature Granulocytes: 0 %
LYMPHS ABS: 1.3 10*3/uL (ref 0.7–3.1)
LYMPHS: 18 %
MCH: 31.2 pg (ref 26.6–33.0)
MCHC: 34.3 g/dL (ref 31.5–35.7)
MCV: 91 fL (ref 79–97)
Monocytes Absolute: 0.5 10*3/uL (ref 0.1–0.9)
Monocytes: 7 %
NEUTROS ABS: 5.4 10*3/uL (ref 1.4–7.0)
Neutrophils: 75 %
Platelets: 224 10*3/uL (ref 150–379)
RBC: 4.91 x10E6/uL (ref 4.14–5.80)
RDW: 14.1 % (ref 12.3–15.4)
WBC: 7.2 10*3/uL (ref 3.4–10.8)

## 2017-05-14 LAB — LIPID PANEL
Chol/HDL Ratio: 2.4 ratio (ref 0.0–5.0)
Cholesterol, Total: 127 mg/dL (ref 100–199)
HDL: 54 mg/dL (ref >39)
LDL CALC: 56 mg/dL (ref 0–99)
Triglycerides: 84 mg/dL (ref 0–149)
VLDL CHOLESTEROL CAL: 17 mg/dL (ref 5–40)

## 2017-05-14 LAB — PSA: PROSTATE SPECIFIC AG, SERUM: 0.4 ng/mL (ref 0.0–4.0)

## 2017-05-17 ENCOUNTER — Encounter: Payer: Self-pay | Admitting: Radiology

## 2017-05-18 ENCOUNTER — Telehealth: Payer: Self-pay | Admitting: Emergency Medicine

## 2017-05-18 NOTE — Telephone Encounter (Signed)
Pt has a question about an abdominal ultra sound. He was wondering when it would be scheduled.

## 2017-05-18 NOTE — Telephone Encounter (Signed)
Please advise 

## 2017-05-18 NOTE — Telephone Encounter (Signed)
Lab results given as per request.

## 2017-05-19 ENCOUNTER — Telehealth: Payer: Self-pay | Admitting: Emergency Medicine

## 2017-05-19 NOTE — Telephone Encounter (Signed)
Patient notified of [place] order and number given.

## 2017-05-19 NOTE — Telephone Encounter (Signed)
Left vm to call back, unable to leave detailed message. Pt's U/S order has been sent to [facility] and they should call pt to schedule. [facility] scheduling can be reached at [phone].

## 2017-05-25 ENCOUNTER — Other Ambulatory Visit: Payer: Self-pay | Admitting: Emergency Medicine

## 2017-05-25 ENCOUNTER — Telehealth: Payer: Self-pay | Admitting: Emergency Medicine

## 2017-05-25 ENCOUNTER — Ambulatory Visit (HOSPITAL_COMMUNITY)
Admission: RE | Admit: 2017-05-25 | Discharge: 2017-05-25 | Disposition: A | Discharge status: Home / Self Care | Payer: Medicare Other | Source: Ambulatory Visit | Attending: Emergency Medicine | Admitting: Emergency Medicine

## 2017-05-25 DIAGNOSIS — R222 Localized swelling, mass and lump, trunk: Secondary | ICD-10-CM | POA: Insufficient documentation

## 2017-05-25 NOTE — Telephone Encounter (Signed)
Incoming call from Jenny Reichmann at [facility]. He is calling for clarification on an ultrasound order- does provider want to order US Abdomen limited or US Pelvic limited?  Spoke with Dr. Mitchel Honour. Ok to change order. John notified. Closing note.

## 2017-05-27 ENCOUNTER — Telehealth: Payer: Self-pay | Admitting: Emergency Medicine

## 2017-05-27 NOTE — Telephone Encounter (Signed)
Copied from [place]. Topic: Quick Communication - See Telephone Encounter >> May 27, 2017  8:32 AM Robina Ade, Helene Kelp D wrote: CRM for notification. See Telephone encounter for: 05/27/17. Patient called and said that he is still hurting on his right side and wants to know what to do. Please call patient back, thanks.

## 2017-05-28 NOTE — Telephone Encounter (Signed)
Please advise 

## 2017-05-30 NOTE — Telephone Encounter (Signed)
Needs to be re-evaluated. Schedule OV please.

## 2017-05-31 NOTE — Telephone Encounter (Signed)
Spoke with patient states he is feeling better.  Advised if symptoms come back he will need to be reevaluated

## 2017-08-24 ENCOUNTER — Encounter (HOSPITAL_COMMUNITY)
Admission: RE | Admit: 2017-08-24 | Discharge: 2017-08-24 | Disposition: A | Discharge status: Home / Self Care | Payer: Medicare Other | Source: Ambulatory Visit | Attending: Neurosurgery | Admitting: Neurosurgery

## 2017-08-24 ENCOUNTER — Other Ambulatory Visit: Payer: Self-pay | Admitting: Neurosurgery

## 2017-08-24 ENCOUNTER — Other Ambulatory Visit: Payer: Self-pay

## 2017-08-24 ENCOUNTER — Encounter (HOSPITAL_COMMUNITY): Payer: Self-pay

## 2017-08-24 DIAGNOSIS — Z01812 Encounter for preprocedural laboratory examination: Secondary | ICD-10-CM | POA: Insufficient documentation

## 2017-08-24 DIAGNOSIS — M8609 Acute hematogenous osteomyelitis, multiple sites: Secondary | ICD-10-CM

## 2017-08-24 DIAGNOSIS — M869 Osteomyelitis, unspecified: Secondary | ICD-10-CM

## 2017-08-24 LAB — CBC WITH DIFFERENTIAL/PLATELET
Basophils Absolute: 0 10*3/uL (ref 0.0–0.1)
Basophils Relative: 0 %
Eosinophils Absolute: 0 10*3/uL (ref 0.0–0.7)
Eosinophils Relative: 0 %
HCT: 44.3 % (ref 39.0–52.0)
Hemoglobin: 15.2 g/dL (ref 13.0–17.0)
Lymphocytes Relative: 23 %
Lymphs Abs: 1.9 10*3/uL (ref 0.7–4.0)
MCH: 31.2 pg (ref 26.0–34.0)
MCHC: 34.3 g/dL (ref 30.0–36.0)
MCV: 91 fL (ref 78.0–100.0)
Monocytes Absolute: 0.6 10*3/uL (ref 0.1–1.0)
Monocytes Relative: 7 %
Neutro Abs: 6 10*3/uL (ref 1.7–7.7)
Neutrophils Relative %: 70 %
Platelets: 255 10*3/uL (ref 150–400)
RBC: 4.87 MIL/uL (ref 4.22–5.81)
RDW: 13 % (ref 11.5–15.5)
WBC: 8.5 10*3/uL (ref 4.0–10.5)

## 2017-08-24 LAB — COMPREHENSIVE METABOLIC PANEL
ALT: 20 U/L (ref 17–63)
AST: 23 U/L (ref 15–41)
Albumin: 4.3 g/dL (ref 3.5–5.0)
Alkaline Phosphatase: 71 U/L (ref 38–126)
Anion gap: 12 (ref 5–15)
BUN: 6 mg/dL (ref 6–20)
CO2: 24 mmol/L (ref 22–32)
Calcium: 9.4 mg/dL (ref 8.9–10.3)
Chloride: 100 mmol/L — ABNORMAL LOW (ref 101–111)
Creatinine, Ser: 0.92 mg/dL (ref 0.61–1.24)
GFR calc Af Amer: >60 mL/min (ref >60)
GFR calc non Af Amer: >60 mL/min (ref >60)
Glucose, Bld: 101 mg/dL — ABNORMAL HIGH (ref 65–99)
Potassium: 3.6 mmol/L (ref 3.5–5.1)
Sodium: 136 mmol/L (ref 135–145)
Total Bilirubin: 0.9 mg/dL (ref 0.3–1.2)
Total Protein: 7.9 g/dL (ref 6.5–8.1)

## 2017-08-24 LAB — SEDIMENTATION RATE: Sed Rate: 10 mm/hr (ref 0–16)

## 2017-08-24 LAB — TYPE AND SCREEN
ABO/RH(D): O POS
Antibody Screen: NEGATIVE

## 2017-08-24 NOTE — Progress Notes (Addendum)
History states he is allergic to our laundry detergent.  I have call "Laundry - [phone]" and their instructions are to call the morning of surgery -- 18th of March and they will have special linens brought to unit" PCP is Dr. Leane Platt at [facility]  04/2017 Denies any cardiac issues.  No murmur, no cp, sob. Did have echo back in 2009 and a stress test approx 6-10 yrs ago. Neurologist is Dr. Jannifer Franklin  LOV 11/2016

## 2017-08-24 NOTE — Pre-Procedure Instructions (Signed)
Martin Edwards  08/24/2017      [place], [address] [facility] [address] [place] [address] Phone: [phone] Fax: [phone]  [id number] - [place], [place] [address] Phone: [phone] Fax: [phone]    Your procedure is scheduled on Monday, March 18th   Report to [facility] Admitting at 9:30 AM             (posted surgery time 11:30a - 1:44p)   Call this number if you have problems the morning of surgery:  [phone]   Remember:               4-5 days prior to surgery, STOP TAKING any Vitamins, Herbal Supplements, Anti-inflammatories.   Do not eat food or drink liquids after midnight, Sunday.   Take these medicines the morning of surgery with A SIP OF WATER : Norvasc.   Do not wear jewelry - no rings or watches.  Do not wear lotions, colognes or deodorant.  Men may shave face and neck.  Do not bring valuables to the hospital.  [facility] is not responsible for any belongings or valuables.  Contacts, dentures or bridgework may not be worn into surgery.  Leave your suitcase in the car.  After surgery it may be brought to your room.  For patients admitted to the hospital, discharge time will be determined by your treatment team.  Please read over the following fact sheets that you were given. Pain Booklet and Surgical Site Infection Prevention     [place]- Preparing For Surgery  Before surgery, you can play an important role. Because skin is not sterile, your skin needs to be as free of germs as possible. You can reduce the number of germs on your skin by washing with CHG (chlorahexidine gluconate) Soap before surgery.  CHG is an antiseptic cleaner which kills germs and bonds with the skin to continue killing germs even after washing.  Please do not use if you have an allergy to CHG or antibacterial  soaps. If your skin becomes reddened/irritated stop using the CHG.  Do not shave (including legs and underarms) for at least 48 hours prior to first CHG shower. It is OK to shave your face.  Please follow these instructions carefully.   1. Shower the NIGHT BEFORE SURGERY and the MORNING OF SURGERY with CHG.   2. If you chose to wash your hair, wash your hair first as usual with your normal shampoo.  3. After you shampoo, rinse your hair and body thoroughly to remove the shampoo.  4. Use CHG as you would any other liquid soap. You can apply CHG directly to the skin and wash gently with a scrungie or a clean washcloth.   5. Apply the CHG Soap to your body ONLY FROM THE NECK DOWN.  Do not use on open wounds or open sores. Avoid contact with your eyes, ears, mouth and genitals (private parts). Wash Face and genitals (private parts)  with your normal soap.  6. Wash thoroughly, paying special attention to the area where your surgery will be performed.  7. Thoroughly rinse your body with warm water from the neck down.  8. DO NOT shower/wash with your normal soap after using and rinsing off the CHG Soap.  9. Pat yourself dry with a CLEAN TOWEL.  10. Wear CLEAN PAJAMAS to bed  the night before surgery, wear comfortable clothes the morning of surgery  11. Place CLEAN SHEETS on your bed the night of your first shower and DO NOT SLEEP WITH PETS.    Day of Surgery: Do not apply any deodorants/lotions. Please wear clean clothes to the hospital/surgery center.

## 2017-08-25 ENCOUNTER — Ambulatory Visit
Admission: RE | Admit: 2017-08-25 | Discharge: 2017-08-25 | Disposition: A | Discharge status: Home / Self Care | Payer: Medicare Other | Source: Ambulatory Visit | Attending: Neurosurgery | Admitting: Neurosurgery

## 2017-08-25 DIAGNOSIS — M8609 Acute hematogenous osteomyelitis, multiple sites: Secondary | ICD-10-CM

## 2017-08-25 LAB — HIGH SENSITIVITY CRP: CRP, High Sensitivity: 1.2 mg/L (ref 0.00–3.00)

## 2017-08-25 MED ORDER — GADOBENATE DIMEGLUMINE 529 MG/ML IV SOLN
16.0000 mL | Freq: Once | INTRAVENOUS | Status: AC | PRN
  Administered 2017-08-25: 16 mL via INTRAVENOUS

## 2017-08-28 ENCOUNTER — Ambulatory Visit: Payer: Self-pay | Admitting: Plastic Surgery

## 2017-08-28 DIAGNOSIS — S0100XA Unspecified open wound of scalp, initial encounter: Secondary | ICD-10-CM

## 2017-08-29 ENCOUNTER — Inpatient Hospital Stay (HOSPITAL_COMMUNITY): Payer: Medicare Other | Admitting: Emergency Medicine

## 2017-08-29 ENCOUNTER — Encounter (HOSPITAL_COMMUNITY): Payer: Self-pay | Admitting: Certified Registered Nurse Anesthetist

## 2017-08-29 ENCOUNTER — Other Ambulatory Visit: Payer: Self-pay

## 2017-08-29 ENCOUNTER — Inpatient Hospital Stay (HOSPITAL_COMMUNITY)
Admission: RE | Admit: 2017-08-29 | Discharge: 2017-09-01 | DRG: 465 | Disposition: A | Discharge status: Home Health | Payer: Medicare Other | Source: Ambulatory Visit | Attending: Neurosurgery | Admitting: Neurosurgery

## 2017-08-29 ENCOUNTER — Inpatient Hospital Stay (HOSPITAL_COMMUNITY)
Admission: RE | Disposition: A | Discharge status: Home Health | Payer: Self-pay | Source: Ambulatory Visit | Attending: Neurosurgery

## 2017-08-29 DIAGNOSIS — R569 Unspecified convulsions: Secondary | ICD-10-CM | POA: Diagnosis present

## 2017-08-29 DIAGNOSIS — Z8546 Personal history of malignant neoplasm of prostate: Secondary | ICD-10-CM

## 2017-08-29 DIAGNOSIS — Z801 Family history of malignant neoplasm of trachea, bronchus and lung: Secondary | ICD-10-CM | POA: Diagnosis not present

## 2017-08-29 DIAGNOSIS — Z87820 Personal history of traumatic brain injury: Secondary | ICD-10-CM

## 2017-08-29 DIAGNOSIS — Z9889 Other specified postprocedural states: Secondary | ICD-10-CM

## 2017-08-29 DIAGNOSIS — Z967 Presence of other bone and tendon implants: Secondary | ICD-10-CM | POA: Diagnosis not present

## 2017-08-29 DIAGNOSIS — S0100XA Unspecified open wound of scalp, initial encounter: Secondary | ICD-10-CM

## 2017-08-29 DIAGNOSIS — Z85828 Personal history of other malignant neoplasm of skin: Secondary | ICD-10-CM

## 2017-08-29 DIAGNOSIS — B9689 Other specified bacterial agents as the cause of diseases classified elsewhere: Secondary | ICD-10-CM

## 2017-08-29 DIAGNOSIS — M8660 Other chronic osteomyelitis, unspecified site: Principal | ICD-10-CM | POA: Diagnosis present

## 2017-08-29 DIAGNOSIS — Z79899 Other long term (current) drug therapy: Secondary | ICD-10-CM

## 2017-08-29 DIAGNOSIS — Z95828 Presence of other vascular implants and grafts: Secondary | ICD-10-CM | POA: Diagnosis not present

## 2017-08-29 DIAGNOSIS — R51 Headache: Secondary | ICD-10-CM | POA: Diagnosis not present

## 2017-08-29 DIAGNOSIS — K219 Gastro-esophageal reflux disease without esophagitis: Secondary | ICD-10-CM | POA: Diagnosis present

## 2017-08-29 DIAGNOSIS — M869 Osteomyelitis, unspecified: Secondary | ICD-10-CM | POA: Diagnosis present

## 2017-08-29 DIAGNOSIS — Z91048 Other nonmedicinal substance allergy status: Secondary | ICD-10-CM

## 2017-08-29 DIAGNOSIS — Z9049 Acquired absence of other specified parts of digestive tract: Secondary | ICD-10-CM | POA: Diagnosis not present

## 2017-08-29 DIAGNOSIS — I10 Essential (primary) hypertension: Secondary | ICD-10-CM | POA: Diagnosis present

## 2017-08-29 DIAGNOSIS — E785 Hyperlipidemia, unspecified: Secondary | ICD-10-CM | POA: Diagnosis present

## 2017-08-29 DIAGNOSIS — M8668 Other chronic osteomyelitis, other site: Secondary | ICD-10-CM | POA: Diagnosis not present

## 2017-08-29 DIAGNOSIS — Z87442 Personal history of urinary calculi: Secondary | ICD-10-CM | POA: Diagnosis not present

## 2017-08-29 DIAGNOSIS — B957 Other staphylococcus as the cause of diseases classified elsewhere: Secondary | ICD-10-CM | POA: Diagnosis not present

## 2017-08-29 DIAGNOSIS — Z87891 Personal history of nicotine dependence: Secondary | ICD-10-CM

## 2017-08-29 DIAGNOSIS — X58XXXA Exposure to other specified factors, initial encounter: Secondary | ICD-10-CM | POA: Diagnosis not present

## 2017-08-29 DIAGNOSIS — R202 Paresthesia of skin: Secondary | ICD-10-CM | POA: Diagnosis not present

## 2017-08-29 DIAGNOSIS — S0511XA Contusion of eyeball and orbital tissues, right eye, initial encounter: Secondary | ICD-10-CM | POA: Diagnosis not present

## 2017-08-29 HISTORY — PX: APPLICATION OF A-CELL OF HEAD/NECK: SHX6304

## 2017-08-29 HISTORY — PX: SCALP LACERATION REPAIR: SHX6089

## 2017-08-29 HISTORY — PX: CRANIOTOMY: SHX93

## 2017-08-29 LAB — CULTURE, BLOOD (SINGLE)
Culture: NO GROWTH
Special Requests: ADEQUATE

## 2017-08-29 SURGERY — CRANIOTOMY HEMATOMA EVACUATION SUBDURAL
Anesthesia: General

## 2017-08-29 SURGERY — Surgical Case
Anesthesia: *Unknown

## 2017-08-29 MED ORDER — ACETAMINOPHEN 325 MG PO TABS
650.0000 mg | ORAL_TABLET | ORAL | Status: DC | PRN
  Administered 2017-08-29 – 2017-09-01 (×11): 650 mg via ORAL
  Filled 2017-08-29 (×11): qty 2

## 2017-08-29 MED ORDER — ROCURONIUM BROMIDE 10 MG/ML (PF) SYRINGE
PREFILLED_SYRINGE | INTRAVENOUS | Status: AC
  Filled 2017-08-29: qty 5

## 2017-08-29 MED ORDER — LIDOCAINE HCL (CARDIAC) 20 MG/ML IV SOLN
INTRAVENOUS | Status: AC
  Filled 2017-08-29: qty 5

## 2017-08-29 MED ORDER — CEFAZOLIN SODIUM-DEXTROSE 2-4 GM/100ML-% IV SOLN
INTRAVENOUS | Status: AC
  Filled 2017-08-29: qty 100

## 2017-08-29 MED ORDER — MENTHOL 3 MG MT LOZG: 1.0000 | LOZENGE | OROMUCOSAL | Status: DC | PRN

## 2017-08-29 MED ORDER — PHENYLEPHRINE HCL 10 MG/ML IJ SOLN
INTRAMUSCULAR | Status: DC | PRN
  Administered 2017-08-29: 25 ug/min via INTRAVENOUS

## 2017-08-29 MED ORDER — PHENYLEPHRINE HCL 10 MG/ML IJ SOLN
INTRAMUSCULAR | Status: DC | PRN
  Administered 2017-08-29: 80 ug via INTRAVENOUS
  Administered 2017-08-29: 40 ug via INTRAVENOUS
  Administered 2017-08-29: 120 ug via INTRAVENOUS
  Administered 2017-08-29: 160 ug via INTRAVENOUS
  Administered 2017-08-29: 200 ug via INTRAVENOUS
  Administered 2017-08-29 (×2): 80 ug via INTRAVENOUS

## 2017-08-29 MED ORDER — MAGNESIUM HYDROXIDE 400 MG/5ML PO SUSP: 30.0000 mL | Freq: Every day | ORAL | Status: DC | PRN

## 2017-08-29 MED ORDER — LIDOCAINE HCL (CARDIAC) 20 MG/ML IV SOLN
INTRAVENOUS | Status: DC | PRN
  Administered 2017-08-29: 60 mg via INTRAVENOUS

## 2017-08-29 MED ORDER — PHENYLEPHRINE 40 MCG/ML (10ML) SYRINGE FOR IV PUSH (FOR BLOOD PRESSURE SUPPORT)
PREFILLED_SYRINGE | INTRAVENOUS | Status: AC
  Filled 2017-08-29: qty 10

## 2017-08-29 MED ORDER — MEPERIDINE HCL 50 MG/ML IJ SOLN: 6.2500 mg | INTRAMUSCULAR | Status: DC | PRN

## 2017-08-29 MED ORDER — PROPOFOL 10 MG/ML IV BOLUS
INTRAVENOUS | Status: AC
  Filled 2017-08-29: qty 20

## 2017-08-29 MED ORDER — AMLODIPINE BESYLATE 10 MG PO TABS
10.0000 mg | ORAL_TABLET | Freq: Every day | ORAL | Status: DC
  Administered 2017-08-30 – 2017-09-01 (×3): 10 mg via ORAL
  Filled 2017-08-29 (×2): qty 1
  Filled 2017-08-29: qty 2
  Filled 2017-08-29: qty 1

## 2017-08-29 MED ORDER — SODIUM CHLORIDE 0.9 % IV SOLN: 250.0000 mL | INTRAVENOUS | Status: DC

## 2017-08-29 MED ORDER — VANCOMYCIN HCL IN DEXTROSE 1-5 GM/200ML-% IV SOLN
1000.0000 mg | Freq: Two times a day (BID) | INTRAVENOUS | Status: DC
Start: 2017-08-29 — End: 2017-09-01
  Administered 2017-08-29 – 2017-09-01 (×6): 1000 mg via INTRAVENOUS
  Filled 2017-08-29 (×6): qty 200

## 2017-08-29 MED ORDER — PROPOFOL 10 MG/ML IV BOLUS
INTRAVENOUS | Status: DC | PRN
  Administered 2017-08-29: 120 mg via INTRAVENOUS

## 2017-08-29 MED ORDER — SUGAMMADEX SODIUM 200 MG/2ML IV SOLN
INTRAVENOUS | Status: DC | PRN
  Administered 2017-08-29: 200 mg via INTRAVENOUS

## 2017-08-29 MED ORDER — METHYLENE BLUE 0.5 % INJ SOLN
INTRAVENOUS | Status: AC
  Filled 2017-08-29: qty 10

## 2017-08-29 MED ORDER — MORPHINE SULFATE (PF) 4 MG/ML IV SOLN: 4.0000 mg | INTRAVENOUS | Status: DC | PRN

## 2017-08-29 MED ORDER — SODIUM CHLORIDE 0.9% FLUSH
3.0000 mL | Freq: Two times a day (BID) | INTRAVENOUS | Status: DC
  Administered 2017-08-30: 3 mL via INTRAVENOUS

## 2017-08-29 MED ORDER — HEMOSTATIC AGENTS (NO CHARGE) OPTIME
TOPICAL | Status: DC | PRN
  Administered 2017-08-29: 1 via TOPICAL

## 2017-08-29 MED ORDER — HYDROXYZINE HCL 25 MG PO TABS
50.0000 mg | ORAL_TABLET | ORAL | Status: DC | PRN
  Administered 2017-08-31: 50 mg via ORAL
  Filled 2017-08-29: qty 2

## 2017-08-29 MED ORDER — THROMBIN 20000 UNITS EX SOLR
CUTANEOUS | Status: AC
  Filled 2017-08-29: qty 20000

## 2017-08-29 MED ORDER — LIDOCAINE-EPINEPHRINE 1 %-1:100000 IJ SOLN
INTRAMUSCULAR | Status: AC
  Filled 2017-08-29: qty 1

## 2017-08-29 MED ORDER — FLEET ENEMA 7-19 GM/118ML RE ENEM: 1.0000 | ENEMA | Freq: Once | RECTAL | Status: DC | PRN

## 2017-08-29 MED ORDER — ACETAMINOPHEN 650 MG RE SUPP: 650.0000 mg | RECTAL | Status: DC | PRN

## 2017-08-29 MED ORDER — KCL IN DEXTROSE-NACL 40-5-0.45 MEQ/L-%-% IV SOLN
INTRAVENOUS | Status: DC
  Filled 2017-08-29: qty 1000

## 2017-08-29 MED ORDER — PHENOL 1.4 % MT LIQD: 1.0000 | OROMUCOSAL | Status: DC | PRN

## 2017-08-29 MED ORDER — 0.9 % SODIUM CHLORIDE (POUR BTL) OPTIME
TOPICAL | Status: DC | PRN
  Administered 2017-08-29 (×2): 1000 mL

## 2017-08-29 MED ORDER — FENTANYL CITRATE (PF) 100 MCG/2ML IJ SOLN
INTRAMUSCULAR | Status: DC | PRN
  Administered 2017-08-29 (×3): 50 ug via INTRAVENOUS

## 2017-08-29 MED ORDER — SODIUM CHLORIDE 0.9 % IV SOLN
INTRAVENOUS | Status: DC
Start: 2017-08-29 — End: 2017-09-01
  Administered 2017-08-29: 10:00:00 via INTRAVENOUS

## 2017-08-29 MED ORDER — SODIUM CHLORIDE 0.9 % IR SOLN
Status: DC | PRN
  Administered 2017-08-29: 13:00:00

## 2017-08-29 MED ORDER — BISACODYL 10 MG RE SUPP: 10.0000 mg | Freq: Every day | RECTAL | Status: DC | PRN

## 2017-08-29 MED ORDER — POVIDONE-IODINE 10 % EX OINT
TOPICAL_OINTMENT | CUTANEOUS | Status: AC
  Filled 2017-08-29: qty 28.35

## 2017-08-29 MED ORDER — THROMBIN (RECOMBINANT) 20000 UNITS EX SOLR
CUTANEOUS | Status: DC | PRN
  Administered 2017-08-29: 14:00:00 via TOPICAL

## 2017-08-29 MED ORDER — BUPIVACAINE HCL (PF) 0.25 % IJ SOLN
INTRAMUSCULAR | Status: DC | PRN
  Administered 2017-08-29: 6.5 mL

## 2017-08-29 MED ORDER — ALUM & MAG HYDROXIDE-SIMETH 200-200-20 MG/5ML PO SUSP: 30.0000 mL | Freq: Four times a day (QID) | ORAL | Status: DC | PRN

## 2017-08-29 MED ORDER — HYDROMORPHONE HCL 1 MG/ML IJ SOLN
INTRAMUSCULAR | Status: AC
  Filled 2017-08-29: qty 1

## 2017-08-29 MED ORDER — CHLORHEXIDINE GLUCONATE CLOTH 2 % EX PADS: 6.0000 | MEDICATED_PAD | Freq: Once | CUTANEOUS | Status: DC

## 2017-08-29 MED ORDER — FENTANYL CITRATE (PF) 250 MCG/5ML IJ SOLN
INTRAMUSCULAR | Status: AC
  Filled 2017-08-29: qty 5

## 2017-08-29 MED ORDER — BUPIVACAINE HCL (PF) 0.25 % IJ SOLN
INTRAMUSCULAR | Status: AC
  Filled 2017-08-29: qty 30

## 2017-08-29 MED ORDER — HYDROCODONE-ACETAMINOPHEN 5-325 MG PO TABS
1.0000 | ORAL_TABLET | ORAL | Status: DC | PRN
  Administered 2017-09-01: 1 via ORAL
  Filled 2017-08-29: qty 1

## 2017-08-29 MED ORDER — LISINOPRIL 40 MG PO TABS
40.0000 mg | ORAL_TABLET | Freq: Every day | ORAL | Status: DC
  Administered 2017-08-30 – 2017-09-01 (×3): 40 mg via ORAL
  Filled 2017-08-29: qty 1
  Filled 2017-08-29: qty 2
  Filled 2017-08-29 (×3): qty 1

## 2017-08-29 MED ORDER — BACITRACIN ZINC 500 UNIT/GM EX OINT
TOPICAL_OINTMENT | CUTANEOUS | Status: AC
  Filled 2017-08-29: qty 28.35

## 2017-08-29 MED ORDER — SODIUM CHLORIDE 0.9% FLUSH: 3.0000 mL | INTRAVENOUS | Status: DC | PRN

## 2017-08-29 MED ORDER — PROMETHAZINE HCL 25 MG/ML IJ SOLN: 6.2500 mg | INTRAMUSCULAR | Status: DC | PRN

## 2017-08-29 MED ORDER — HYDROCHLOROTHIAZIDE 12.5 MG PO CAPS
12.5000 mg | ORAL_CAPSULE | Freq: Every day | ORAL | Status: DC
  Administered 2017-08-30 – 2017-09-01 (×3): 12.5 mg via ORAL
  Filled 2017-08-29 (×3): qty 1

## 2017-08-29 MED ORDER — ROCURONIUM BROMIDE 100 MG/10ML IV SOLN
INTRAVENOUS | Status: DC | PRN
  Administered 2017-08-29: 20 mg via INTRAVENOUS
  Administered 2017-08-29: 50 mg via INTRAVENOUS
  Administered 2017-08-29: 10 mg via INTRAVENOUS

## 2017-08-29 MED ORDER — DEXAMETHASONE SODIUM PHOSPHATE 10 MG/ML IJ SOLN
INTRAMUSCULAR | Status: DC | PRN
  Administered 2017-08-29: 10 mg via INTRAVENOUS

## 2017-08-29 MED ORDER — HYDROXYZINE HCL 50 MG/ML IM SOLN: 50.0000 mg | INTRAMUSCULAR | Status: DC | PRN

## 2017-08-29 MED ORDER — ATORVASTATIN CALCIUM 40 MG PO TABS
40.0000 mg | ORAL_TABLET | Freq: Every day | ORAL | Status: DC
  Administered 2017-08-29 – 2017-09-01 (×4): 40 mg via ORAL
  Filled 2017-08-29: qty 2
  Filled 2017-08-29 (×2): qty 1
  Filled 2017-08-29: qty 2
  Filled 2017-08-29 (×2): qty 1

## 2017-08-29 MED ORDER — CEFAZOLIN SODIUM-DEXTROSE 2-4 GM/100ML-% IV SOLN: 2.0000 g | INTRAVENOUS | Status: DC

## 2017-08-29 MED ORDER — CEFAZOLIN SODIUM-DEXTROSE 2-4 GM/100ML-% IV SOLN
2.0000 g | INTRAVENOUS | Status: AC
  Administered 2017-08-29: 2 g via INTRAVENOUS

## 2017-08-29 MED ORDER — ONDANSETRON HCL 4 MG/2ML IJ SOLN
INTRAMUSCULAR | Status: AC
  Filled 2017-08-29: qty 2

## 2017-08-29 MED ORDER — CEFAZOLIN SODIUM-DEXTROSE 2-4 GM/100ML-% IV SOLN: 2.0000 g | Freq: Three times a day (TID) | INTRAVENOUS | Status: DC

## 2017-08-29 MED ORDER — LIDOCAINE-EPINEPHRINE 1 %-1:100000 IJ SOLN
INTRAMUSCULAR | Status: DC | PRN
  Administered 2017-08-29: 6.5 mL

## 2017-08-29 MED ORDER — HYDROMORPHONE HCL 1 MG/ML IJ SOLN
0.2500 mg | INTRAMUSCULAR | Status: DC | PRN
  Administered 2017-08-29 (×4): 0.25 mg via INTRAVENOUS

## 2017-08-29 MED ORDER — DEXAMETHASONE SODIUM PHOSPHATE 10 MG/ML IJ SOLN
INTRAMUSCULAR | Status: AC
  Filled 2017-08-29: qty 1

## 2017-08-29 MED ORDER — SODIUM CHLORIDE 0.9 % IV SOLN
2.0000 g | INTRAVENOUS | Status: DC
  Administered 2017-08-29 – 2017-08-30 (×2): 2 g via INTRAVENOUS
  Filled 2017-08-29 (×3): qty 20

## 2017-08-29 MED ORDER — ONDANSETRON HCL 4 MG/2ML IJ SOLN
INTRAMUSCULAR | Status: DC | PRN
  Administered 2017-08-29: 4 mg via INTRAVENOUS

## 2017-08-29 MED ORDER — LACTATED RINGERS IV SOLN
INTRAVENOUS | Status: DC | PRN
  Administered 2017-08-29 (×2): via INTRAVENOUS

## 2017-08-29 MED ORDER — LACTATED RINGERS IV SOLN: INTRAVENOUS | Status: DC

## 2017-08-29 SURGICAL SUPPLY — 72 items
BAG DECANTER FOR FLEXI CONT (MISCELLANEOUS) ×2 IMPLANT
BANDAGE ACE 4X5 VEL STRL LF (GAUZE/BANDAGES/DRESSINGS) ×2 IMPLANT
BANDAGE GAUZE 4  KLING STR (GAUZE/BANDAGES/DRESSINGS) IMPLANT
BIT DRILL WIRE PASS 1.3MM (BIT) ×1 IMPLANT
BNDG CONFORM 2 STRL LF (GAUZE/BANDAGES/DRESSINGS) IMPLANT
BNDG GAUZE ELAST 4 BULKY (GAUZE/BANDAGES/DRESSINGS) ×4 IMPLANT
BUR MATCHSTICK NEURO 3.0 LAGG (BURR) ×2 IMPLANT
BUR SPIRAL ROUTER 2.3 (BUR) ×2 IMPLANT
CANISTER SUCT 3000ML PPV (MISCELLANEOUS) ×2 IMPLANT
CARTRIDGE OIL MAESTRO DRILL (MISCELLANEOUS) ×1 IMPLANT
CLEANER TIP ELECTROSURG 2X2 (MISCELLANEOUS) ×2 IMPLANT
CLIP RANEY DISP (INSTRUMENTS) ×2 IMPLANT
CLIP VESOCCLUDE MED 6/CT (CLIP) IMPLANT
CONT SPEC 4OZ CLIKSEAL STRL BL (MISCELLANEOUS) ×2 IMPLANT
DERMABOND ADVANCED (GAUZE/BANDAGES/DRESSINGS) ×1
DERMABOND ADVANCED .7 DNX12 (GAUZE/BANDAGES/DRESSINGS) ×1 IMPLANT
DIFFUSER DRILL AIR PNEUMATIC (MISCELLANEOUS) ×2 IMPLANT
DRAIN PENROSE 1/2X12 LTX STRL (WOUND CARE) IMPLANT
DRAPE NEUROLOGICAL W/INCISE (DRAPES) ×2 IMPLANT
DRAPE WARM FLUID 44X44 (DRAPE) ×2 IMPLANT
DRILL WIRE PASS 1.3MM (BIT) ×2
DRSG ADAPTIC 3X8 NADH LF (GAUZE/BANDAGES/DRESSINGS) IMPLANT
DRSG EMULSION OIL 3X3 NADH (GAUZE/BANDAGES/DRESSINGS) IMPLANT
DRSG PAD ABDOMINAL 8X10 ST (GAUZE/BANDAGES/DRESSINGS) IMPLANT
ELECT REM PT RETURN 9FT ADLT (ELECTROSURGICAL) ×2
ELECTRODE REM PT RTRN 9FT ADLT (ELECTROSURGICAL) ×1 IMPLANT
GAUZE SPONGE 4X4 12PLY STRL (GAUZE/BANDAGES/DRESSINGS) IMPLANT
GAUZE SPONGE 4X4 16PLY XRAY LF (GAUZE/BANDAGES/DRESSINGS) IMPLANT
GLOVE BIO SURGEON STRL SZ 6.5 (GLOVE) ×2 IMPLANT
GLOVE BIOGEL PI IND STRL 8 (GLOVE) ×2 IMPLANT
GLOVE BIOGEL PI INDICATOR 8 (GLOVE) ×2
GLOVE ECLIPSE 7.5 STRL STRAW (GLOVE) ×4 IMPLANT
GOWN STRL REUS W/ TWL LRG LVL3 (GOWN DISPOSABLE) ×2 IMPLANT
GOWN STRL REUS W/ TWL XL LVL3 (GOWN DISPOSABLE) IMPLANT
GOWN STRL REUS W/TWL 2XL LVL3 (GOWN DISPOSABLE) ×2 IMPLANT
GOWN STRL REUS W/TWL LRG LVL3 (GOWN DISPOSABLE) ×2
GOWN STRL REUS W/TWL XL LVL3 (GOWN DISPOSABLE)
GRAFT DURAGEN MATRIX 1WX1L (Tissue) ×2 IMPLANT
HEMOSTAT SURGICEL 2X14 (HEMOSTASIS) ×2 IMPLANT
KIT BASIN OR (CUSTOM PROCEDURE TRAY) ×4 IMPLANT
KIT ROOM TURNOVER OR (KITS) ×4 IMPLANT
MATRIX GENTRIX 6X15 (Tissue) ×2 IMPLANT
NEEDLE SPNL 22GX3.5 QUINCKE BK (NEEDLE) ×2 IMPLANT
NS IRRIG 1000ML POUR BTL (IV SOLUTION) ×4 IMPLANT
OIL CARTRIDGE MAESTRO DRILL (MISCELLANEOUS) ×2
PACK CRANIOTOMY CUSTOM (CUSTOM PROCEDURE TRAY) ×2 IMPLANT
PAD ABD 8X10 STRL (GAUZE/BANDAGES/DRESSINGS) ×2 IMPLANT
PAD ARMBOARD 7.5X6 YLW CONV (MISCELLANEOUS) ×6 IMPLANT
PATTIES SURGICAL .5 X.5 (GAUZE/BANDAGES/DRESSINGS) IMPLANT
PATTIES SURGICAL .5 X3 (DISPOSABLE) IMPLANT
PATTIES SURGICAL 1/4 X 3 (GAUZE/BANDAGES/DRESSINGS) IMPLANT
PATTIES SURGICAL 1X1 (DISPOSABLE) IMPLANT
PENCIL BUTTON HOLSTER BLD 10FT (ELECTRODE) ×2 IMPLANT
SEALANT ADHERUS EXTEND TIP (MISCELLANEOUS) ×2 IMPLANT
SPONGE NEURO XRAY DETECT 1X3 (DISPOSABLE) IMPLANT
SPONGE SURGIFOAM ABS GEL 100 (HEMOSTASIS) ×2 IMPLANT
STAPLER SKIN PROX WIDE 3.9 (STAPLE) ×2 IMPLANT
SUT CHROMIC 4 0 P 3 18 (SUTURE) IMPLANT
SUT ETHILON 3 0 FSL (SUTURE) IMPLANT
SUT ETHILON 4 0 PS 2 18 (SUTURE) IMPLANT
SUT ETHILON 5 0 P 3 18 (SUTURE)
SUT MNCRL AB 3-0 PS2 18 (SUTURE) ×4 IMPLANT
SUT MNCRL AB 4-0 PS2 18 (SUTURE) ×4 IMPLANT
SUT MON AB 5-0 PS2 18 (SUTURE) ×8 IMPLANT
SUT NURALON 4 0 TR CR/8 (SUTURE) ×2 IMPLANT
SUT NYLON ETHILON 5-0 P-3 1X18 (SUTURE) IMPLANT
SUT VIC AB 2-0 CP2 18 (SUTURE) ×2 IMPLANT
TOWEL GREEN STERILE (TOWEL DISPOSABLE) ×2 IMPLANT
TOWEL GREEN STERILE FF (TOWEL DISPOSABLE) ×2 IMPLANT
UNDERPAD 30X30 (UNDERPADS AND DIAPERS) IMPLANT
WATER STERILE IRR 1000ML POUR (IV SOLUTION) ×2 IMPLANT
YANKAUER SUCT BULB TIP NO VENT (SUCTIONS) ×2 IMPLANT

## 2017-08-29 NOTE — Anesthesia Procedure Notes (Signed)
Procedure Name: Intubation Date/Time: 08/29/2017 12:56 PM Performed by: Inda Coke, CRNA Pre-anesthesia Checklist: Patient identified, Emergency Drugs available, Suction available and Patient being monitored Patient Re-evaluated:Patient Re-evaluated prior to induction Oxygen Delivery Method: Circle System Utilized Preoxygenation: Pre-oxygenation with 100% oxygen Induction Type: IV induction Ventilation: Mask ventilation without difficulty Laryngoscope Size: Mac and 4 Grade View: Grade I Tube type: Oral Tube size: 7.5 mm Number of attempts: 1 Airway Equipment and Method: Stylet and Oral airway Placement Confirmation: ETT inserted through vocal cords under direct vision,  positive ETCO2 and breath sounds checked- equal and bilateral Secured at: 22 cm Tube secured with: Tape Dental Injury: Teeth and Oropharynx as per pre-operative assessment

## 2017-08-29 NOTE — H&P (Signed)
Martin Edwards is an 72 y.o. male.   Chief Complaint: scalp defect HPI: The patient is a 72 yrs old wm here for treatment of his scalp wound.  He underwent surgery ~2 years ago for a bleed and had some complications that required a bone flap harvest replacement.  Over the past several weeks he noticed a wound on his right scalp and hardware exposed.  Will plan to do the case with neurosurgery.  There is no surrounding redness or drainage.  Past Medical History:  Diagnosis Date  . Basal cell carcinoma of face X 2  . GERD (gastroesophageal reflux disease)   . History of kidney stones   . Hyperlipidemia   . Hypertension   . Prostate cancer ([place]) 1999  . Seizures ([place]) 11/2014   brain trauma -hematoma  . Small bowel obstruction due to adhesions ([place]) 01/30/2014  . Subdural hematoma [facility])     Past Surgical History:  Procedure Laterality Date  . BASAL CELL CARCINOMA EXCISION  X 2   "off my face both times"  . BRAIN SURGERY    . COLECTOMY  10/2010   "took out 8 inches of colon; wasn't cancer"  . CRANIOTOMY Right 11/11/2014   Procedure: CRANIOTOMY HEMATOMA EVACUATION SUBDURAL;  Surgeon: Leeroy Cha, MD;  Location: [facility]UR[facility] ORS;  Service: Neurosurgery;  Laterality: Right;  . CRANIOTOMY Right 11/13/2014   Procedure: Removal bone flap, insertion into the abdominal wall;  Surgeon: Leeroy Cha, MD;  Location: [facility];  Service: Neurosurgery;  Laterality: Right;  . CRANIOTOMY N/A 03/07/2015   Procedure: Cranioplasty using Bone flap in abdomen;  Surgeon: Leeroy Cha, MD;  Location: [facility] NEUR[facility];  Service: Neurosurgery;  Laterality: N/A;  Cranioplasty using Bone flap in abdomen  . EXCISION MASS NECK Right 04/14/2016   Procedure: EXCISION BIOPSY OF RIGHT NECK LYMPH NODE;  Surgeon: Jerrell Belfast, MD;  Location: [facility];  Service: ENT;  Laterality: Right;  . EXCISION MELANOMA WITH SENTINEL LYMPH NODE BIOPSY Right 04/14/2016   Procedure: WIDE EXCISION MELANOMA RIGHT SHOULDER WITH SENTINEL  NODE  MAPPING;  Surgeon: Autumn Messing III, MD;  Location: [facility];  Service: General;  Laterality: Right;  . PROSTATECTOMY  05/20/1998    Family History  Problem Relation Age of Onset  . Cancer Father        Lung   Social History:  reports that he has quit smoking. he has never used smokeless tobacco. He reports that he does not drink alcohol or use drugs.  Allergies:  Allergies  Allergen Reactions  . Other Rash    Needs Hypoallergenic Sheets/ Laundry Cart had questionable allergic reaction to detergent last time he was here.    Medications Prior to Admission  Medication Sig Dispense Refill  . amLODipine (NORVASC) 10 MG tablet Take 1 tablet (10 mg total) by mouth daily. 90 tablet 3  . atorvastatin (LIPITOR) 40 MG tablet Take 1 tablet (40 mg total) by mouth daily. 90 tablet 3  . hydrochlorothiazide (MICROZIDE) 12.5 MG capsule Take 1 capsule (12.5 mg total) by mouth daily. 90 capsule 3  . lisinopril (PRINIVIL,ZESTRIL) 40 MG tablet Take 1 tablet (40 mg total) by mouth daily. 90 tablet 3    No results found for this or any previous visit (from the past 48 hour(s)). No results found.  Review of Systems  Constitutional: Negative.  Negative for chills and fever.  HENT: Negative.   Eyes: Negative.   Respiratory: Negative.   Cardiovascular: Negative.   Gastrointestinal: Negative.  Genitourinary: Negative.   Musculoskeletal: Negative.   Skin: Negative.   Neurological: Negative.   Psychiatric/Behavioral: Negative.     Blood pressure (!) 150/80, pulse 93, temperature 98.5 F (36.9 C), temperature source Oral, resp. rate 20, weight 75.8 kg (167 lb), SpO2 100 %. Physical Exam  Constitutional: He is oriented to person, place, and time. He appears well-developed and well-nourished.  HENT:  Head:    Eyes: Conjunctivae and EOM are normal. Pupils are equal, round, and reactive to light.  Cardiovascular: Normal rate.  Respiratory: Effort normal.  GI: Soft.  Neurological: He is alert and  oriented to person, place, and time.  Skin: Skin is warm. No erythema.  Psychiatric: He has a normal mood and affect. His behavior is normal. Judgment and thought content normal.     Assessment/Plan Plan for closure with scalp rotation flap, advancement or placement of Acell.  [place], DO 08/29/2017, 10:01 AM

## 2017-08-29 NOTE — Anesthesia Postprocedure Evaluation (Signed)
Anesthesia Post Note  Patient: Martin Edwards  Procedure(s) Performed: CRANIECTOMY AND DEBRIDEMENT (N/A ) SCALP FLAP TO COVER DEFECT (N/A ) APPLICATION OF A-CELL (N/A )     Patient location during evaluation: PACU Anesthesia Type: General Level of consciousness: awake and alert Pain management: pain level controlled Vital Signs Assessment: post-procedure vital signs reviewed and stable Respiratory status: spontaneous breathing, nonlabored ventilation, respiratory function stable and patient connected to nasal cannula oxygen Cardiovascular status: blood pressure returned to baseline and stable Postop Assessment: no apparent nausea or vomiting Anesthetic complications: no    Last Vitals:  Vitals:   08/29/17 1703 08/29/17 1735  BP: 126/72 119/68  Pulse: 89 (!) 59  Resp: 12 16  Temp:  36.9 C  SpO2: 97% 98%    Last Pain:  Vitals:   08/29/17 1735  TempSrc: Oral  PainSc:                  Effie Berkshire

## 2017-08-29 NOTE — Progress Notes (Signed)
[facility] Infusion Coordinator will follow pt with ID team to support home infusion pharmacy services if needed for IV ABX at DC.  If patient discharges after hours, please call [phone].   Larry Sierras 08/29/2017, 10:58 PM

## 2017-08-29 NOTE — Op Note (Signed)
DATE OF OPERATION: 08/29/2017  LOCATION: [facility]  PREOPERATIVE DIAGNOSIS: scalp defect  POSTOPERATIVE DIAGNOSIS: Same  PROCEDURE: Closure of scalp defect 3 x 3 cm with rotational flap of scalp with placement of 5 x 8 cm Acell sheet.  SURGEON: Videl Nobrega Sanger Marquesha Robideau, DO  COSURGEON: Jovita Gamma, MD  EBL: 75 cc  CONDITION: Stable  COMPLICATIONS: None  INDICATION: The patient, Martin Edwards, is a 72 y.o. male born on May 23, 1946, is here for treatment of a scalp defect after a previous craniectomy with hardware exposed.Marland Kitchen   PROCEDURE DETAILS:  The patient was seen prior to surgery and marked.  The IV antibiotics were given. The patient was taken to the operating room and given a general anesthetic. SCDs were placed.   The patient was positioned by the neurosurgery team.  The scalp was shaved at the right side.  The head was then prepped and draped.   A standard time out was performed and all information was confirmed by those in the room. Local was injected at the proposed incision site which included the previous incision site on the right temple area of the scalp.  The incision was made by Dr. Sherwood Gambler and his portion of the case is dictated separately. This included removal  Of a segment of skull bone (3 x 5 cm).  Once he had completed his portion of the case the patient was then in the care of the plastic surgery team.  The temporalis muscle was released and rotated onto the dural defect.  This was sutured in place by Dr. Sherwood Gambler to cover the defect.  The scalp was released from the pericarnium circumferentially around the defect to provide better closure without tension.  The incision was then advanced posteriorly to rotate the flap anteriorly to cover the 5 x 8 cm defect.  The bovie was used to score the galea for more advancement with 2 cm distance between the scoring.  Hemostasis was achieved with electrocautery.  There was good hemostasis.  The incision was  closed over the one layer acell sheet (5 x 8 cm) using 3-0 Monocryl vertical mattress sutures.  The 4-0 and 5-0 Monocryl was then used for closure.  Derma bond was applied.  The patient was wrapped with kerlex, ABD and Ace wrap.   The patient was allowed to wake up and taken to recovery room in stable condition at the end of the case. The family was notified at the end of the case.

## 2017-08-29 NOTE — Transfer of Care (Signed)
Immediate Anesthesia Transfer of Care Note  Patient: Martin Edwards  Procedure(s) Performed: CRANIECTOMY AND DEBRIDEMENT (N/A ) SCALP FLAP TO COVER DEFECT (N/A ) APPLICATION OF A-CELL (N/A )  Patient Location: PACU  Anesthesia Type:General  Level of Consciousness: awake and alert   Airway & Oxygen Therapy: Patient Spontanous Breathing and Patient connected to nasal cannula oxygen  Post-op Assessment: Report given to RN, Post -op Vital signs reviewed and stable and Patient moving all extremities X 4  Post vital signs: Reviewed and stable  Last Vitals:  Vitals:   08/29/17 0939 08/29/17 1503  BP: (!) 150/80   Pulse: 93   Resp: 20   Temp: 36.9 C 36.7 C  SpO2: 100%     Last Pain:  Vitals:   08/29/17 0939  TempSrc: Oral      Patients Stated Pain Goal: 5 (27/07/86 7544)  Complications: No apparent anesthesia complications

## 2017-08-29 NOTE — Progress Notes (Signed)
Pharmacy Antibiotic Note  Martin Edwards is a 72 y.o. 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 Pharmacy has been consulted for Vancomycin dosing.  Also starting on Ceftriaxone 2 gm IV q24hrs.  Cefazolin 2gm IV x 1 given pre-op. Bone culture sent.  ID following.  Plan:  Vancomycin 1gm IV q12hrs.  Target Vanc troughs 15-20 mcg/ml.  Vanc trough level at steady state.  Ceftriaxone 2gm IV q24hrs.  Follow up renal function, culture data, clinical progress and antibiotics plans.  Discontinued post-op Cefazolin x 2 doses.   Height: 5\' 10"  (177.8 cm) Weight: 167 lb (75.8 kg) IBW/kg (Calculated) : 73  Temp (24hrs), Avg:98.4 F (36.9 C), Min:98 F (36.7 C), Max:98.6 F (37 C)  Recent Labs  Lab 08/24/17 1553  WBC 8.5  CREATININE 0.92    Estimated Creatinine Clearance: 74.9 mL/min (by C-G formula based on SCr of 0.92 mg/dL).    Allergies  Allergen Reactions  . Other Rash    Needs Hypoallergenic Sheets/ Laundry Cart had questionable allergic reaction to detergent last time he was here.    Antimicrobials this admission:   Vancomycin 3/18>>   Ceftriaxone 3/18>>   Cefazolin x 1 pre-op on 3/18  Dose adjustments this admission:  n/a  Microbiology results:   3/18 tissue from bone (skull) - few gram positive cocci, moderate gram positive rods, few WBCs, predominantly PMN   3/13 blood culture x 1 - negative  Thank you for allowing pharmacy to be a part of this patient's care.  Arty Baumgartner, [place] pagerL [phone] 08/29/2017 6:04 PM

## 2017-08-29 NOTE — Progress Notes (Signed)
Vitals:   08/29/17 1700 08/29/17 1703 08/29/17 1735 08/29/17 1800  BP:  126/72 119/68   Pulse: 88 89 (!) 59   Resp: 13 12 16    Temp: 98.6 F (37 C)  98.4 F (36.9 C)   TempSrc:   Oral   SpO2: 99% 97% 98%   Weight:      Height:    5\' 10"  (1.778 m)    Patient resting comfortably in bed. Awake alert, oriented. Following commands. Moving all 4 extremities well.  Dressing clean and dry.  Appreciate Dr. Hale Bogus consultation (ID).  Ancef discontinued, and patient started on vancomycin and Rocephin. Spoke with the patient's son and daughter this evening about Dr. Hale Bogus expectation that patient will require home IV antibody therapy via a PICC.  They've indicated that they would be happy to meet with Dr. Megan Salon and his staff tomorrow to help facilitate postdischarge antibiotic therapy.  Plan: Continue IV antibiotics. Encouraged to ambulate.  Hosie Spangle, MD 08/29/2017, 7:27 PM

## 2017-08-29 NOTE — Consult Note (Addendum)
[facility] for Infectious Disease    Date of Admission:  08/29/2017     Total days of antibiotics 0  Ancef intra-op         Reason for Consult: Osteomyelitis of skull flap with retained hardware     Referring Provider: Dr. Sherwood Gambler / Dillingham  Primary Care Provider: Horald Pollen, MD   Attending attestation: I have seen and examined [name] postoperatively and discussed his care with Janene Madeira NP.  I am in agreement with her assessment and plan.  Mr. 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  He underwent incision and debridement today with removal of (question all) hardware followed by flap closure.  Operative Gram stain shows gram-positive cocci and gram-positive rods.  We will treat with empiric vancomycin and ceftriaxone pending final culture results.  We will follow-up tomorrow.  Michel Bickers, MD [facility] for Infectious [facility] [phone] pager   [phone] cell 08/29/2017, 5:17 PM  Assessment: 72 y.o. pleasant male with history of previous subdural hematoma evacuation that unfortunately recurred and required partial craniectomy and implantation of flap in abdomen; cranioplasty with bone flap repair 4 months later in September 2016. He was hemodynamically stable prior to surgery and no empiric antibiotics were given which will hopefully result in recovery of an offending pathogen.He has remained afebrile, no leukocytosis, negative blood cultures and normal inflammatory markers - likely this is a more chronic osteomyelitis process that has been ongoing for some time.   Considering dural tear following procedure today concern for contiguous area that could result in deeper infection. He presently has no signs of encephalitis or meningitis which is encouraging.   Plan: 1. Start Vancomycin per pharmacy and ceftriaxone 2 gm IV q24h to cover  empirically.  2. Will follow and adjust for culture growth to determine discharge plan - will need to talk more with he and his family to determine if PICC Line is a viable option for treatment; it sounds like he has some family available for support.  3. He does have some retained hardware in this area to keep in mind - 3 plates and screws? May need to add Rifampin.  4. OK from infection stand point to place PICC Line considering his blood cultures were negative and no constitutional symptoms present - will need to ensure proper support at home or SNF disposition for treatment.    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 amLODipine  10 mg Oral Daily  . atorvastatin  40 mg Oral Daily  . Chlorhexidine Gluconate Cloth  6 each Topical Once   And  . Chlorhexidine Gluconate Cloth  6 each Topical Once  . hydrochlorothiazide  12.5 mg Oral Daily  . HYDROmorphone      . lisinopril  40 mg Oral Daily    HPI: Martin Edwards is a 72 y.o. male admitted to the hospital for treatment of scalp wound and osteomyelitis of skull flap. In May 2016 he sustained a large right subdural hematoma after he fell in the shower. This injury required emergency craniotomy for evacuation of clot; he also required harvesting of bone flap for recurrence of the bleed as well as seizures and implantation into the abdominal wall. Elective repair of the cranial defect and re-implantation of bone flap in September of 2016 which was uneventful. He was seen in the outpatient setting by Dr.  Dillingham on 08/26/17 where he noticed a dark area to his skull over the last few months that was getting larger; screws were visualized by his family and 3x3 cm area of skin defect over a plate and screw on the right parietal area was visualized. There was no drainage or malodor noted however concern for necrotic bone on MRI of the brain and it was arranged to have him taken to the OR for craniectomy and debridement with Dr. Sherwood Gambler.    08/29/17 Op Note: Scalp mobilized from the skull and straight cranial plate was exposed and screws removed as well as the plate. Dura was dissected from overlying skull and the inferior portion of the bone flat was cut away as it was eroded. Mobilized a temporalis myofascial flap over dural defect as well as scalp flap. Wound was closed.   02/2015 Op Note - 4 plates and several screws were used to replace bone flap.   In speaking with Mr. Beck he is not quite certain as to how long his hardware has been exposed. He denies any fevers, chills, night sweats, weight loss, vision changes, pain at the flap site, headaches, neck pain/stiffness, swollen lymph nodes, dental pain. 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 He has not been working with animals, dirt/gardening recently. He showers every day to every other day at home and does wash his hair so he constantly has felt this area and remarks that even to this day it has felt no different to him. He felt "strange" even considering surgery because he has "felt so good." Presently his pain is nearing a 10/10 on the right skull and a little sleepy since surgery.   Review of Systems: Review of Systems  Constitutional: Negative for chills, fever, malaise/fatigue and weight loss.  HENT: Negative for sore throat and tinnitus.   Eyes: Negative for blurred vision and photophobia.  Respiratory: Negative for cough, sputum production and shortness of breath.   Cardiovascular: Negative for chest pain and leg swelling.  Gastrointestinal: Negative for abdominal pain, diarrhea, nausea and vomiting.  Genitourinary: Negative for dysuria.  Musculoskeletal: Negative for joint pain, myalgias and neck pain.  Skin: Negative for rash.  Neurological: Negative for headaches.  Psychiatric/Behavioral: Negative for depression and substance abuse. The patient is not  nervous/anxious.     Past Medical History:  Diagnosis Date  . Basal cell carcinoma of face X 2  . GERD (gastroesophageal reflux disease)   . History of kidney stones   . Hyperlipidemia   . Hypertension   . Prostate cancer ([place]) 1999  . Seizures ([place]) 11/2014   brain trauma -hematoma  . Small bowel obstruction due to adhesions ([place]) 01/30/2014  . Subdural hematoma ([facility])     Social History   Tobacco Use  . Smoking status: Former Research scientist (life sciences)  . Smokeless tobacco: Never Used  Substance Use Topics  . Alcohol use: No    Alcohol/week: 0.0 oz    Comment: 01/30/2014 "maybe a beer twice/yr"  . Drug use: No    Family History  Problem Relation Age of Onset  . Cancer Father        Lung   Allergies  Allergen Reactions  . Other Rash    Needs Hypoallergenic Sheets/ Laundry Cart had questionable allergic reaction to detergent last time he was here.    OBJECTIVE: Blood pressure 126/72, pulse 89, temperature 98.6 F (37 C),  resp. rate 12, weight 167 lb (75.8 kg), SpO2 97 %.  Physical Exam  Constitutional: He is oriented to person, place, and time.  Lying in stretcher in PACU appears to be in some pain but in no distress. Well nourished and developed.   HENT:  Mouth/Throat: No oral lesions. Normal dentition. No dental caries.  Periorbital/facial swelling noted following recent procedure. Surgical dressing wrapped around his head obscuring full assessment.   Eyes: Pupils are equal, round, and reactive to light. No scleral icterus.  Neck: Normal range of motion. Neck supple.  Cardiovascular: Normal rate, regular rhythm, normal heart sounds and intact distal pulses.  No murmur heard. Pulmonary/Chest: Effort normal and breath sounds normal. No respiratory distress. He has no rales.  Abdominal: Soft. He exhibits no distension. There is no tenderness.  Musculoskeletal: He exhibits no edema.  Lymphadenopathy:    He has no cervical adenopathy.  Neurological: He is alert and oriented to  person, place, and time. No cranial nerve deficit. GCS score is 15.  Skin: Skin is warm and dry. No rash noted.  Psychiatric: Mood, memory and affect normal.  Vitals reviewed.   Lab Results Lab Results  Component Value Date   WBC 8.5 08/24/2017   HGB 15.2 08/24/2017   HCT 44.3 08/24/2017   MCV 91.0 08/24/2017   PLT 255 08/24/2017    Lab Results  Component Value Date   CREATININE 0.92 08/24/2017   BUN 6 08/24/2017   NA 136 08/24/2017   K 3.6 08/24/2017   CL 100 (L) 08/24/2017   CO2 24 08/24/2017    Lab Results  Component Value Date   ALT 20 08/24/2017   AST 23 08/24/2017   ALKPHOS 71 08/24/2017   BILITOT 0.9 08/24/2017     Microbiology: BCx 3/13 >> NG x 5d Skull Fragments 08/29/17 >> pending   Janene Madeira, MSN, NP-[facility] Cell: [phone] Pager: [phone]  08/29/2017 5:14 PM

## 2017-08-29 NOTE — Op Note (Signed)
08/29/2017  2:53 PM  PATIENT:  Martin Edwards  72 y.o. male  PRE-OPERATIVE DIAGNOSIS: Osteomyelitis of the skull, scalp defect  POST-OPERATIVE DIAGNOSIS: Osteomyelitis of the skull, scalp defect, dural defect  PROCEDURE:  Procedure(s): CRANIECTOMY AND DEBRIDEMENT SCALP FLAP TO COVER DEFECT APPLICATION OF A-CELL  SURGEON:  Surgeon(s): Jovita Gamma, MD Dillingham, Loel Lofty, DO  ANESTHESIA:   general  EBL:  Total I/O In: 1000 [I.V.:1000] Out: 19 [Blood:75]  BLOOD ADMINISTERED:none  COUNT:  Correct per nursing staff  SPECIMEN:  Source of Specimen:  Skull  DICTATION:   Surgery comprised both neurosurgery and plastic/reconstructive surgery.  This is a dictation of the neurosurgical portion of the procedure.  The plastic/reconstructive surgical portion of procedure is being separately dictated by Dr. Marla Roe.  Patient was brought to the operating room, placed under general endotracheal anesthesia.  Patient was turned to the left with rolls beneath the right shoulder.  The head was supported with a horseshoe headrest.  The hair from the right side of the scalp was clipped, and the scalp defect exposed, the underlying skull and cranial plate and screw were identified.  The right side of the head was prepped with Betadine soap and solution, and draped in a sterile fashion.  The scalp was examined, and noted to be thinned over the area of the bone flap.  Dr. Marla Roe drew out a incision that would allow her to mobilize a scalp flap.  Line of the incision was infiltrated with local anesthetic with epinephrine.  Incision was made in a curvilinear incision extending over the inferior aspect of the previous craniotomy.  The scalp was mobilized from the skull, and the straight cranial plate was exposed and the screws removed and the plate removed.  We then made to small bur holes with a high-speed drill.  The dura was dissected from the overlying skull and using the craniotome we cut away the  inferior portion of the bone flap, which imaging had shown was eroded.  This portion of the skull was sent for cultures, Gram stain, and pathology.  At the inferior posterior aspect of the craniectomy there is a dural defect.  It appears that a patch had been used for dural defect at the time of his previous surgeries, and that this area had just a thin layer of arachnoid.  We therefore mobilized a temporalis myofascial flap.  A pledget of DuraGen was placed over the dural defect, the temporalis myofascial flap was sutured over this with 4-0 Nurolon, and then Adhereis.  At this point Dr. Marla Roe took over primary surgical responsibility for closure, mobilizing a scalp flap and proceeding with scalp proximation and closure.  Dr. Marla Roe applied a dressing and cranial wrap.  Following surgery the patient was reversed from the anesthetic, extubated, transferred to the recovery room for further care.  PLAN OF CARE: Admit to inpatient   PATIENT DISPOSITION:  PACU - hemodynamically stable.   Delay start of Pharmacological VTE agent (>24hrs) due to surgical blood loss or risk of bleeding:  yes

## 2017-08-29 NOTE — H&P (Signed)
Subjective: Patient is a 72 y.o. right-handed white male who is admitted for treatment of right temporal scalp defect, measuring about 12 x 15 mm, with underlying skull and cranial plate and screw exposed.  Skull x-ray, CT scan of the head without contrast, and MRI of the brain without and with gadolinium confirm localized infection of the bone flap adjacent to the plate and screw, with no underlying intracranial pathology.  Patient is admitted now for craniectomy and debridement with the assistance of Dr. Lyndee Leo Dillingham, for scalp closure via either scalp flap or A-cell.  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  He was taken to surgery by Dr. Joya Salm for craniotomy and evacuation of the subdural hematoma.  He developed reaccumulation of the subdural hematoma was taken back to surgery 2 days later, the bone flap was placed in the abdominal wall, and the subdural evacuated again.  Patient was taken back to surgery by Dr. Joya Salm 3 months later for retrieval of the bone flap from the abdominal wall and cranioplasty with the retrieved bone flap.  He did well, and Dr. Joya Salm released him 3 months following the third surgery.  Patient's family noted exposure of the screw about a month ago, we were contacted last week, and evaluated the patient and he is admitted now for surgery.   Patient Active Problem List   Diagnosis Date Noted  . History of cranioplasty 03/07/2015  . Seizures ([place]) 11/27/2014  . Abnormal LFTs 11/27/2014  . UTI (urinary tract infection), bacterial 11/27/2014  . 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 Left-sided sensory deficit present 11/19/2014  . Sebaceous cyst x2 01/04/2014  . Prostate cancer ([place]) 12/12/2012  . Hypertension 10/21/2011  . Hyperlipidemia 10/21/2011  . Adenomatous colon polyp 10/21/2011  . Postop check 12/29/2010   Past Medical History:  Diagnosis Date  . Basal cell carcinoma of face X 2  . GERD  (gastroesophageal reflux disease)   . History of kidney stones   . Hyperlipidemia   . Hypertension   . Prostate cancer ([place]) 1999  . Seizures ([place]) 11/2014   brain trauma -hematoma  . Small bowel obstruction due to adhesions ([place]) 01/30/2014  . Subdural hematoma [facility])     Past Surgical History:  Procedure Laterality Date  . BASAL CELL CARCINOMA EXCISION  X 2   "off my face both times"  . BRAIN SURGERY    . COLECTOMY  10/2010   "took out 8 inches of colon; wasn't cancer"  . CRANIOTOMY Right 11/11/2014   Procedure: CRANIOTOMY HEMATOMA EVACUATION SUBDURAL;  Surgeon: Leeroy Cha, MD;  Location: [facility] NEURO ORS;  Service: Neurosurgery;  Laterality: Right;  . CRANIOTOMY Right 11/13/2014   Procedure: Removal bone flap, insertion into the abdominal wall;  Surgeon: Leeroy Cha, MD;  Location: [facility] NEURO ORS;  Service: Neurosurgery;  Laterality: Right;  . CRANIOTOMY N/A 03/07/2015   Procedure: Cranioplasty using Bone flap in abdomen;  Surgeon: Leeroy Cha, MD;  Location: [name] NEURO ORS;  Service: Neurosurgery;  Laterality: N/A;  Cranioplasty using Bone flap in abdomen  . EXCISION MASS NECK Right 04/14/2016   Procedure: EXCISION BIOPSY OF RIGHT NECK LYMPH NODE;  Surgeon: Jerrell Belfast, MD;  Location: [facility];  Service: ENT;  Laterality: Right;  . EXCISION MELANOMA WITH SENTINEL LYMPH NODE BIOPSY Right 04/14/2016   Procedure: WIDE EXCISION MELANOMA RIGHT SHOULDER WITH SENTINEL  NODE MAPPING;  Surgeon: Autumn Messing III, MD;  Location: [facility];  Service: General;  Laterality: Right;  .  PROSTATECTOMY  05/20/1998    Medications Prior to Admission  Medication Sig Dispense Refill Last Dose  . amLODipine (NORVASC) 10 MG tablet Take 1 tablet (10 mg total) by mouth daily. 90 tablet 3 08/29/2017 at 0700  . atorvastatin (LIPITOR) 40 MG tablet Take 1 tablet (40 mg total) by mouth daily. 90 tablet 3 08/28/2017 at Unknown time  . hydrochlorothiazide (MICROZIDE) 12.5 MG capsule Take 1 capsule (12.5 mg total) by mouth daily.  90 capsule 3 08/28/2017 at Unknown time  . lisinopril (PRINIVIL,ZESTRIL) 40 MG tablet Take 1 tablet (40 mg total) by mouth daily. 90 tablet 3 08/28/2017 at Unknown time   Allergies  Allergen Reactions  . Other Rash    Needs Hypoallergenic Sheets/ Laundry Cart had questionable allergic reaction to detergent last time he was here.    Social History   Tobacco Use  . Smoking status: Former Research scientist (life sciences)  . Smokeless tobacco: Never Used  Substance Use Topics  . Alcohol use: No    Alcohol/week: 0.0 oz    Comment: 01/30/2014 "maybe a beer twice/yr"    Family History  Problem Relation Age of Onset  . Cancer Father        Lung     Review of Systems A comprehensive review of systems was negative.  Objective: Vital signs in last 24 hours: Temp:  [98.5 F (36.9 C)] 98.5 F (36.9 C) (03/18 0939) Pulse Rate:  [93] 93 (03/18 0939) Resp:  [20] 20 (03/18 0939) BP: (150)/(80) 150/80 (03/18 0939) SpO2:  [100 %] 100 % (03/18 0939) Weight:  [75.8 kg (167 lb)] 75.8 kg (167 lb) (03/18 0939)  EXAM: Patient is well-developed well-nourished white male in no acute distress.   Lungs are clear to auscultation , the patient has symmetrical respiratory excursion. Heart has a regular rate and rhythm normal S1 and S2 no murmur.   Abdomen is soft nontender nondistended bowel sounds are present. Extremity examination shows no clubbing cyanosis or edema. External examination shows a right temporal scalp defect measuring about 12 x 15 mm.  A straight plate and screw are seen as well as the underlying skull which has a somewhat dusky appearance.  No visible purulence is seen. Neurologic examination shows patient awake alert, fully oriented.  Speech is fluent.  He has good comprehension.  Written nerves show pupils are equal, round, and reactive to light, and about 3 mm bilaterally.  Extraocular movements are intact.  Facial movement is symmetrical.  Facial sensations intact.  Hearing is present bilaterally.  Palatal  movement is symmetrical.  Shoulder shrug is symmetrical.  Tongue is midline.  Motor examination shows 5/5 strength in the upper and lower extremities.  He has no drift of the upper extremities.  Sensation is intact to pinprick throughout.  Reflexes: Left biceps is minimal right biceps is 2.  Left brachioradialis is trace, right brachioradialis is 2.  Triceps are 2 bilaterally.  Quads are 3-4 bilaterally.  Gastric numbness or minimal body.  He has amputation of the left great toe.  Right great toe is downgoing.  He has a normal gait and stance.  Data Review:CBC    Component Value Date/Time   WBC 8.5 08/24/2017 1553   RBC 4.87 08/24/2017 1553   HGB 15.2 08/24/2017 1553   HGB 15.3 05/13/2017 1147   HCT 44.3 08/24/2017 1553   HCT 44.6 05/13/2017 1147   PLT 255 08/24/2017 1553   PLT 224 05/13/2017 1147   MCV 91.0 08/24/2017 1553   MCV 91 05/13/2017 1147  MCH 31.2 08/24/2017 1553   MCHC 34.3 08/24/2017 1553   RDW 13.0 08/24/2017 1553   RDW 14.1 05/13/2017 1147   LYMPHSABS 1.9 08/24/2017 1553   LYMPHSABS 1.3 05/13/2017 1147   MONOABS 0.6 08/24/2017 1553   EOSABS 0.0 08/24/2017 1553   EOSABS 0.0 05/13/2017 1147   BASOSABS 0.0 08/24/2017 1553   BASOSABS 0.0 05/13/2017 1147                          BMET    Component Value Date/Time   NA 136 08/24/2017 1553   NA 136 05/13/2017 1147   K 3.6 08/24/2017 1553   CL 100 (L) 08/24/2017 1553   CO2 24 08/24/2017 1553   GLUCOSE 101 (H) 08/24/2017 1553   BUN 6 08/24/2017 1553   BUN 12 05/13/2017 1147   CREATININE 0.92 08/24/2017 1553   CREATININE 0.97 01/08/2016 1010   CALCIUM 9.4 08/24/2017 1553   GFRNONAA >60 08/24/2017 1553   GFRNONAA 79 01/08/2016 1010   GFRAA >60 08/24/2017 1553   GFRAA >89 01/08/2016 1010     Assessment/Plan: Patient with scalp defect in the right temporal region with exposed skull and cranial plate and screw.  Imaging shows evidence of erosion of the bone consistent with that localized osteomyelitis.  Patient is  admitted now for surgery by myself and Dr. Audelia Hives, from plastic surgery/reconstructive surgery.  We are planning on craniectomy and debridement with Dr. Marla Roe to perform a scalp reconstruction.  We discussed the nature of his condition, the nature of the surgical procedure, and risks including risks of infection, bleeding, neurologic deficit, possibly further surgery, and anesthetic with small car infarction, stroke, pneumonia, and death.  Understanding all this the patient does wish to proceed with surgery and is admitted for such.   Hosie Spangle, MD 08/29/2017 12:14 PM

## 2017-08-29 NOTE — Anesthesia Preprocedure Evaluation (Addendum)
Anesthesia Evaluation  Patient identified by MRN, date of birth, ID band Patient awake    Reviewed: Allergy & Precautions, NPO status , Patient's Chart, lab work & pertinent test results  Airway Mallampati: I  TM Distance: >3 FB Neck ROM: Full    Dental  (+) Teeth Intact, Dental Advisory Given   Pulmonary former smoker,    breath sounds clear to auscultation       Cardiovascular hypertension, Pt. on medications  Rhythm:Regular Rate:Normal     Neuro/Psych Seizures -,  negative psych ROS   GI/Hepatic Neg liver ROS, GERD  ,  Endo/Other  negative endocrine ROS  Renal/GU negative Renal ROS  negative genitourinary   Musculoskeletal negative musculoskeletal ROS (+)   Abdominal Normal abdominal exam  (+)   Peds  Hematology   Anesthesia Other Findings - HLD  Reproductive/Obstetrics                            Lab Results  Component Value Date   WBC 8.5 08/24/2017   HGB 15.2 08/24/2017   HCT 44.3 08/24/2017   MCV 91.0 08/24/2017   PLT 255 08/24/2017   Lab Results  Component Value Date   CREATININE 0.92 08/24/2017   BUN 6 08/24/2017   NA 136 08/24/2017   K 3.6 08/24/2017   CL 100 (L) 08/24/2017   CO2 24 08/24/2017   Lab Results  Component Value Date   INR 0.95 11/11/2014   EKG: normal sinus rhythm.  Anesthesia Physical Anesthesia Plan  ASA: II  Anesthesia Plan: General   Post-op Pain Management:    Induction: Intravenous  PONV Risk Score and Plan: 3 and Ondansetron, Dexamethasone, Midazolam and Treatment may vary due to age or medical condition  Airway Management Planned: Oral ETT  Additional Equipment: None  Intra-op Plan:   Post-operative Plan: Extubation in OR  Informed Consent:   Plan Discussed with: CRNA  Anesthesia Plan Comments:         Anesthesia Quick Evaluation

## 2017-08-30 ENCOUNTER — Inpatient Hospital Stay: Payer: Self-pay

## 2017-08-30 ENCOUNTER — Encounter (HOSPITAL_COMMUNITY): Payer: Self-pay | Admitting: Plastic Surgery

## 2017-08-30 DIAGNOSIS — M869 Osteomyelitis, unspecified: Secondary | ICD-10-CM

## 2017-08-30 DIAGNOSIS — S0511XA Contusion of eyeball and orbital tissues, right eye, initial encounter: Secondary | ICD-10-CM

## 2017-08-30 DIAGNOSIS — X58XXXA Exposure to other specified factors, initial encounter: Secondary | ICD-10-CM

## 2017-08-30 MED ORDER — BOOST / RESOURCE BREEZE PO LIQD CUSTOM
1.0000 | Freq: Three times a day (TID) | ORAL | Status: DC
  Administered 2017-08-30 – 2017-09-01 (×5): 1 via ORAL
  Filled 2017-08-30 (×4): qty 1

## 2017-08-30 MED ORDER — ENSURE ENLIVE PO LIQD
237.0000 mL | Freq: Three times a day (TID) | ORAL | Status: DC
  Administered 2017-08-30 – 2017-09-01 (×7): 237 mL via ORAL
  Filled 2017-08-30 (×10): qty 237

## 2017-08-30 MED ORDER — SODIUM CHLORIDE 0.9% FLUSH: 10.0000 mL | INTRAVENOUS | Status: DC | PRN

## 2017-08-30 MED FILL — Thrombin For Soln 20000 Unit: CUTANEOUS | Qty: 1 | Status: AC

## 2017-08-30 NOTE — Care Management Note (Signed)
Case Management Note  Patient Details  Name: Martin Edwards MRN: [id number] Date of Birth: 1945-07-31  Subjective/Objective:   72 yr old gentleman s/p debridement and hardware removal of skull  Osteomyelitis.              Action/Plan: Case manager spoke with patient concerning discharge plan. He will need IV antibiotics at home for at least 8 weeks. Patient will have support from his son and daughter at discharge. Choice for [facility] was offered and referral called to Neoma Laming, [facility] Liaison, and Carolynn Sayers, [facility] RN IV antibiotic Specialist. PICC line to be placed today.   Expected Discharge Date:   08/31/17               Expected Discharge Plan:  [place]  In-House Referral:  NA  Discharge planning Services  CM Consult  Post Acute Care Choice:  Home Health Choice offered to:  Patient  DME Arranged:  IV pump/equipment DME Agency:  [facility] Arranged:  RN, IV Antibiotics HH Agency:  [facility]  Status of Service:  Completed, signed off  If discussed at [place] of Stay Meetings, dates discussed:    Additional Comments:  Ninfa Meeker, RN 08/30/2017, 3:27 PM

## 2017-08-30 NOTE — Progress Notes (Signed)
Subjective: Patient sitting up eating breakfast post op day one.  No pain or complaints.  Objective: Vital signs in last 24 hours: Temp:  [98 F (36.7 C)-99.3 F (37.4 C)] 98.3 F (36.8 C) (03/19 0345) Pulse Rate:  [59-95] 95 (03/19 0345) Resp:  [10-20] 16 (03/19 0345) BP: (116-150)/(66-80) 128/72 (03/19 0345) SpO2:  [97 %-100 %] 100 % (03/19 0345) Weight:  [75.8 kg (167 lb)] 75.8 kg (167 lb) (03/18 0939) Weight change:     Intake/Output from previous day: 03/18 0701 - 03/19 0700 In: 1550 [I.V.:1550] Out: 75 [Blood:75] Intake/Output this shift: No intake/output data recorded.  General appearance: alert, cooperative and no distress Head: Incision intact, no seroma or hematoma noted.   Incision/Wound:  Lab Results: No results for input(s): WBC, HGB, HCT, PLT in the last 72 hours. BMET No results for input(s): NA, K, CL, CO2, GLUCOSE, BUN, CREATININE, CALCIUM in the last 72 hours.  Studies/Results: No results found.  Medications: I have reviewed the patient's current medications.  Assessment/Plan: Discharge per Dr. Sherwood Gambler.  Follow up with plastic surgery in one week.  May shower tomorrow.  Keep light bandage on area.  LOS: 1 day    Martin Edwards 08/30/2017

## 2017-08-30 NOTE — Progress Notes (Signed)
Vitals:   08/29/17 2000 08/29/17 2325 08/30/17 0345 08/30/17 0755  BP: 130/67 122/69 128/72 135/86  Pulse: 94 91 95 94  Resp: 16 16 16 16   Temp: 99.3 F (37.4 C) 98.9 F (37.2 C) 98.3 F (36.8 C) 99.3 F (37.4 C)  TempSrc: Oral Oral Oral Oral  SpO2: 100% 97% 100% 98%  Weight:      Height:        Patient up and ambulating actively in the halls. Dr. Marla Roe changed the dressing this morning, and I was shown a picture of the incision which is healing nicely.  He has mild headache adequately treated with Tylenol.  Plan: Encouraged to ambulate. Will discharged to home once infectious disease arranges for post discharge antibiotic therapy and ID follow up.  He'll also be following up with me and Dr. Marla Roe post discharge  Hosie Spangle, MD 08/30/2017, 8:54 AM

## 2017-08-30 NOTE — Progress Notes (Addendum)
[facility] for Infectious Disease  Date of Admission:  08/29/2017     Follow up on: Osteomyelitis of skull flap with retained hardware       Total days of antibiotics 1  Vancomycin   Ceftriaxone          Attending attestation:  I saw and examined Mr. Guggenheim this morning and discussed his management with Janene Madeira NP.  I have also reviewed pictures of his scalp incision taken this morning.  I spoke to Dr. Sherwood Gambler again.  He confirmed that there is still hardware under the scalp flap but hopefully out of the field of infection.  He said he felt like he debrided the bone flap back to healthy bone.  I agree with at least 6 weeks of IV antibiotic therapy.  I discussed this recommendation with Mr. Sipos and his son Lennette Bihari (by phone).  They are in agreement.  We will order PICC placement and began making arrangements for outpatient IV antibiotic therapy.  We will arrange follow-up in our clinic following discharge.  Optimal outpatient antibiotic therapy will depend on final culture results.  Michel Bickers, MD [facility] for Infectious Disease [facility] [phone] pager   [phone] cell 08/30/2017, 12:47 PM  ASSESSMENT: 72 y.o. pleasant male with history of previous subdural hematoma evacuation that unfortunately recurred and required partial craniectomy and implantation of flap in abdomen; cranioplasty with bone flap repair 4 months later in September 2016. Now POD #1 for debridement and hardware removal of skull osteomyelitis by Dr. Marla Roe and Dr. Sherwood Gambler. He is healing well and per surgery's recommendations ready to be discharged. Gram stain with gram positive cocci and gram positive rods still pending.   He does have 3 plates remaining that do not seem to be in the area of the concern for the infection.    PLAN: 1. Continue vancomycin and ceftriaxone for now pending identification of pathogens. He will need prolonged IV treatment for 6 - 8 weeks.    2. Will place order for PICC line - I have discussed this with the patient and he is in agreement of the procedure.  3. Will place order for case manager to see the patient to organized home infusion therapy.    Principal Problem:   Osteomyelitis of skull ([place])   . amLODipine  10 mg Oral Daily  . atorvastatin  40 mg Oral q1800  . hydrochlorothiazide  12.5 mg Oral Daily  . lisinopril  40 mg Oral Daily  . sodium chloride flush  3 mL Intravenous Q12H    SUBJECTIVE: Feeling better today. Did have some headaches last night but they are much better now and maintained only on tylenol. He tells me he has been walking in the halls today and moving well. Wondering about the discharge plan. Reports his son lives within 5 minutes from him Lennette Bihari) and able to help with care.    Allergies  Allergen Reactions  . Other Rash    Needs Hypoallergenic Sheets/ Laundry Cart had questionable allergic reaction to detergent last time he was here.    OBJECTIVE: Vitals:   08/29/17 2000 08/29/17 2325 08/30/17 0345 08/30/17 0755  BP: 130/67 122/69 128/72 135/86  Pulse: 94 91 95 94  Resp: 16 16 16 16   Temp: 99.3 F (37.4 C) 98.9 F (37.2 C) 98.3 F (36.8 C) 99.3 F (37.4 C)  TempSrc: Oral Oral Oral Oral  SpO2: 100% 97% 100% 98%  Weight:  Height:       Body mass index is 23.96 kg/m.  Physical Exam  Constitutional: He is oriented to person, place, and time and well-developed, well-nourished, and in no distress.  In bed and appears comfortable.   HENT:  Mouth/Throat: No oral lesions. Normal dentition. No dental caries.  Head dressing clean and dry.   Eyes: No scleral icterus.  Swollen and bruised right eye   Neck: Normal range of motion.  Cardiovascular: Normal rate, regular rhythm and normal heart sounds.  Pulmonary/Chest: Effort normal and breath sounds normal.  Abdominal: Soft. He exhibits no distension. There is no tenderness.  Musculoskeletal: Normal range of motion.   Lymphadenopathy:    He has no cervical adenopathy.  Neurological: He is alert and oriented to person, place, and time. No cranial nerve deficit. Coordination normal.  Skin: Skin is warm and dry. No rash noted.  Psychiatric: Mood and affect normal.  Vitals reviewed.   Lab Results Lab Results  Component Value Date   WBC 8.5 08/24/2017   HGB 15.2 08/24/2017   HCT 44.3 08/24/2017   MCV 91.0 08/24/2017   PLT 255 08/24/2017    Lab Results  Component Value Date   CREATININE 0.92 08/24/2017   BUN 6 08/24/2017   NA 136 08/24/2017   K 3.6 08/24/2017   CL 100 (L) 08/24/2017   CO2 24 08/24/2017    Lab Results  Component Value Date   ALT 20 08/24/2017   AST 23 08/24/2017   ALKPHOS 71 08/24/2017   BILITOT 0.9 08/24/2017     Microbiology: BCx 3/13 >> NG x 5d Skull Fragments 08/29/17 >> pending   Janene Madeira, MSN, NP-C [facility] for Infectious [place] Cell: [phone] Pager: [phone]  08/30/2017  12:12 PM

## 2017-08-30 NOTE — Progress Notes (Signed)
Peripherally Inserted Central Catheter/Midline Placement  The IV Nurse has discussed with the patient and/or persons authorized to consent for the patient, the purpose of this procedure and the potential benefits and risks involved with this procedure.  The benefits include less needle sticks, lab draws from the catheter, and the patient may be discharged home with the catheter. Risks include, but not limited to, infection, bleeding, blood clot (thrombus formation), and puncture of an artery; nerve damage and irregular heartbeat and possibility to perform a PICC exchange if needed/ordered by physician.  Alternatives to this procedure were also discussed.  Bard Power PICC patient education guide, fact sheet on infection prevention and patient information card has been provided to patient /or left at bedside.    PICC/Midline Placement Documentation  PICC Single Lumen 08/30/17 PICC Right Brachial 41 cm 0 cm (Active)  Indication for Insertion or Continuance of Line Home intravenous therapies (PICC only) 08/30/2017 12:00 PM  Exposed Catheter (cm) 0 cm 08/30/2017 12:00 PM  Site Assessment Clean;Dry;Intact 08/30/2017 12:00 PM  Line Status Flushed;Blood return noted 08/30/2017 12:00 PM  Dressing Type Transparent 08/30/2017 12:00 PM  Dressing Status Clean;Dry;Intact;Antimicrobial disc in place 08/30/2017 12:00 PM  Dressing Intervention New dressing 08/30/2017 12:00 PM  Dressing Change Due 09/06/17 08/30/2017 12:00 PM       Martin Edwards 08/30/2017, 12:29 PM

## 2017-08-31 DIAGNOSIS — Z95828 Presence of other vascular implants and grafts: Secondary | ICD-10-CM

## 2017-08-31 LAB — BASIC METABOLIC PANEL
Anion gap: 9 (ref 5–15)
BUN: 10 mg/dL (ref 6–20)
CO2: 26 mmol/L (ref 22–32)
Calcium: 8.4 mg/dL — ABNORMAL LOW (ref 8.9–10.3)
Chloride: 99 mmol/L — ABNORMAL LOW (ref 101–111)
Creatinine, Ser: 0.95 mg/dL (ref 0.61–1.24)
GFR calc Af Amer: >60 mL/min (ref >60)
GFR calc non Af Amer: >60 mL/min (ref >60)
Glucose, Bld: 129 mg/dL — ABNORMAL HIGH (ref 65–99)
Potassium: 3.4 mmol/L — ABNORMAL LOW (ref 3.5–5.1)
Sodium: 134 mmol/L — ABNORMAL LOW (ref 135–145)

## 2017-08-31 MED ORDER — CEFTRIAXONE IV (FOR PTA / DISCHARGE USE ONLY): 2.0000 g | INTRAVENOUS | 0 refills | Status: DC

## 2017-08-31 MED ORDER — VANCOMYCIN IV (FOR PTA / DISCHARGE USE ONLY): 1000.0000 mg | Freq: Two times a day (BID) | INTRAVENOUS | 0 refills | Status: DC

## 2017-08-31 MED ORDER — SODIUM CHLORIDE 0.9 % IV SOLN
2.0000 g | INTRAVENOUS | Status: DC
Start: 2017-08-31 — End: 2017-09-01
  Administered 2017-08-31 – 2017-09-01 (×2): 2 g via INTRAVENOUS
  Filled 2017-08-31 (×2): qty 20

## 2017-08-31 NOTE — Progress Notes (Signed)
PHARMACY CONSULT NOTE FOR:  OUTPATIENT  PARENTERAL ANTIBIOTIC THERAPY (OPAT)  Indication: skull osteo Regimen: 1gm vancomycin q12h and ceftriaxone 2gm q24h End date: Current stop date is 10/10/17, but ID will determine if longer length of therapy needed.   IV antibiotic discharge orders are pended. To discharging provider:  please sign these orders via discharge navigator,  Select New Orders & click on the button choice - Manage This Unsigned Work.     Thank you for allowing pharmacy to be a part of this patient's care.  Maytte Jacot A Tayshaun Kroh 08/31/2017, 10:34 AM

## 2017-08-31 NOTE — Progress Notes (Signed)
Vitals:   08/31/17 0744 08/31/17 0900 08/31/17 1214 08/31/17 1702  BP: 138/69  123/63 136/69  Pulse: 87  82 100  Resp: 16  16 16   Temp: 100.3 F (37.9 C) 98.7 F (37.1 C) 98.6 F (37 C) 98.8 F (37.1 C)  TempSrc: Oral     SpO2: 100%  100% 98%  Weight:      Height:        BMET Recent Labs    08/31/17 0500  NA 134*  K 3.4*  CL 99*  CO2 26  GLUCOSE 129*  BUN 10  CREATININE 0.95  CALCIUM 8.4*    Patient's family, [facility], case management, and nursing staff working on arrangements for adequate coverage with family members and friends to provide the patient with home antibiotic therapy.  Spoke with nursing staff and representative from advanced home care.  Requiring vancomycin every 12 hours and Rocephin every 24 hours. Patient comfortable, up and ambulating actively. Nursing staff continuing dressing changes. Scheduled to see Dr. Marla Roe in follow-up on March 25, I need to see him back in about 3 weeks for follow-up, and Dr. Megan Salon plans on seeing him in early April.  Plan: Continue current care.  Hosie Spangle, MD 08/31/2017, 5:30 PM

## 2017-08-31 NOTE — Progress Notes (Signed)
Patient ID: Martin Edwards, male   DOB: 07/30/1945, 72 y.o.   MRN: [id number]          [facility] for Infectious Disease  Date of Admission:  08/29/2017           Day 2 vancomycin        Day 2 ceftriaxone ASSESSMENT: Operative cultures of his cranial bone flap are growing multiple organisms.  He has at least 2 species of coagulase-negative staph and one gram-positive rod.  Final identifications and antibiotic susceptibility results will not be available until at least tomorrow.  He will be discharged this afternoon.  We will contact [facility] if any changes need to be made to his antibiotic regimen after discharge.  PLAN: 1. Okay for discharge on IV vancomycin and ceftriaxone 2. I will follow-up final culture results 3. I will arrange follow-up in my clinic in early April  Diagnosis: Cranial osteomyelitis  Culture Result: Cultures are pending  Allergies  Allergen Reactions  . Other Rash    Needs Hypoallergenic Sheets/ Laundry Cart had questionable allergic reaction to detergent last time he was here.    OPAT Orders Discharge antibiotics: Per pharmacy protocol vancomycin and ceftriaxone Aim for Vancomycin trough 15-20 (unless otherwise indicated) Duration: 6 weeks End Date: 10/11/2017  [facility] Care Per Protocol:  Labs weekly while on IV antibiotics: _x_ CBC with differential _x_ BMP __ CMP __ CRP __ ESR _x_ Vancomycin trough  __ Please pull PIC at completion of IV antibiotics _x_ Please leave PIC in place until doctor has seen patient or been notified  Fax weekly labs to [phone]  Clinic Follow Up Appt: I will arrange follow-up in my clinic in early April  @   Principal Problem:   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 amLODipine  10 mg Oral Daily  . atorvastatin  40 mg Oral q1800  . feeding supplement  1 Container Oral TID BM  . feeding supplement (ENSURE ENLIVE)  237 mL Oral TID BM  . hydrochlorothiazide  12.5 mg Oral  Daily  . lisinopril  40 mg Oral Daily  . sodium chloride flush  3 mL Intravenous Q12H   Continuous Infusions: . sodium chloride 10 mL/hr at 08/29/17 1001  . sodium chloride    . cefTRIAXone (ROCEPHIN)  IV Stopped (08/30/17 2100)  . dextrose 5 % and 0.45 % NaCl with KCl 40 mEq/L    . vancomycin Stopped (08/31/17 0617)   PRN Meds:.acetaminophen **OR** acetaminophen, alum & mag hydroxide-simeth, bisacodyl, HYDROcodone-acetaminophen, hydrOXYzine, hydrOXYzine, magnesium hydroxide, menthol-cetylpyridinium **OR** phenol, morphine injection, sodium chloride flush, sodium chloride flush, sodium phosphate   SUBJECTIVE: He is feeling better today.  Review of Systems: Review of Systems  Constitutional: Negative for chills, diaphoresis and fever.  Neurological: Positive for headaches.    Allergies  Allergen Reactions  . Other Rash    Needs Hypoallergenic Sheets/ Laundry Cart had questionable allergic reaction to detergent last time he was here.    OBJECTIVE: Vitals:   08/31/17 0330 08/31/17 0744 08/31/17 0900 08/31/17 1214  BP: 122/68 138/69  123/63  Pulse: 77 87  82  Resp: '16 16  16  '$ Temp: 98.8 F (37.1 C) 100.3 F (37.9 C) 98.7 F (37.1 C) 98.6 F (37 C)  TempSrc: Oral Oral    SpO2: 100% 100%  100%  Weight:      Height:       Body mass index is 23.96 kg/m.  Physical Exam  Constitutional:  He is  resting quietly in bed.  He is pleasant and in no distress.  HENT:  Clean, dry gauze dressing around head.    Lab Results Lab Results  Component Value Date   WBC 8.5 08/24/2017   HGB 15.2 08/24/2017   HCT 44.3 08/24/2017   MCV 91.0 08/24/2017   PLT 255 08/24/2017    Lab Results  Component Value Date   CREATININE 0.95 08/31/2017   BUN 10 08/31/2017   NA 134 (L) 08/31/2017   K 3.4 (L) 08/31/2017   CL 99 (L) 08/31/2017   CO2 26 08/31/2017    Lab Results  Component Value Date   ALT 20 08/24/2017   AST 23 08/24/2017   ALKPHOS 71 08/24/2017   BILITOT 0.9 08/24/2017       Microbiology: Recent Results (from the past 240 hour(s))  Blood culture     Status: None   Collection Time: 08/24/17  3:52 PM  Result Value Ref Range Status   Specimen Description BLOOD RIGHT ARM  Final   Special Requests   Final    BOTTLES DRAWN AEROBIC AND ANAEROBIC Blood Culture adequate volume   Culture   Final    NO GROWTH 5 DAYS Performed at [facility], [address] [place], [place] [address]    Report Status 08/29/2017 FINAL  Final  Aerobic/Anaerobic Culture (surgical/deep wound)     Status: None (Preliminary result)   Collection Time: 08/29/17  1:35 PM  Result Value Ref Range Status   Specimen Description BONE SKULL  Final   Special Requests NONE  Final   Gram Stain   Final    FEW WBC PRESENT, PREDOMINANTLY PMN MODERATE GRAM POSITIVE RODS FEW GRAM POSITIVE COCCI    Culture   Final    CULTURE REINCUBATED FOR BETTER GROWTH Performed at [facility], [place] [address] [place], [place] [address]    Report Status PENDING  Incomplete    Michel Bickers, MD [facility] for [facility] [phone] pager   [phone] cell 08/31/2017, 12:52 PM

## 2017-08-31 NOTE — Discharge Summary (Signed)
Physician Discharge Summary  Patient ID: Martin Edwards MRN: [id number] DOB/AGE: 01-30-1946 72 y.o.  Admit date: 08/29/2017 Discharge date: 08/31/2017  Admission Diagnoses:  Osteomyelitis of the skull, scalp defect  Discharge Diagnoses:  Osteomyelitis of the skull, scalp defect, dural defect  Principal Problem:   Osteomyelitis of skull [facility])   Discharged Condition: good  Hospital Course:  Patient was admitted, and taken to surgery by myself and Dr. Audelia Hives.  I performed a craniectomy and debridement of osteomyelitis of the skull bone flap along with repair of a dural defect.  Dr. Marla Roe performed a debridement of the scalp defect and scalp flap for closure of the scalp defect with implantation of A-cell.  Postoperatively he has done well, with minimal discomfort (for which he uses Tylenol), and up and ambulating actively.  Dr. Michel Bickers from the infectious disease service was consulted.  He adjusted the antibiotics to vancomycin and Rocephin, and has made arrangements for home IV antibiotic therapy via a PICC.  Patient is to follow up next week with Dr. Marla Roe for wound care, with me in 3 weeks, and with Dr. Megan Salon as arranged.  Dr. Marla Roe has given dressing and wound care instructions.  Consults:  Dr. Michel Bickers (infectious disease)  Discharge Exam: Blood pressure 138/69, pulse 87, temperature 100.3 F (37.9 C), temperature source Oral, resp. rate 16, height 5\' 10"  (1.778 m), weight 75.8 kg (167 lb), SpO2 100 %.    Disposition: Discharge disposition: 01-Home or Self Care       Discharge Instructions    Driving Restrictions   Complete by:  As directed    No driving for 2 weeks. May ride in the car locally now. May begin to drive locally in 2 weeks.     Allergies as of 08/31/2017      Reactions   Other Rash   Needs Hypoallergenic Sheets/ Laundry Cart had questionable allergic reaction to detergent last time he was here.      Medication List     TAKE these medications   amLODipine 10 MG tablet Commonly known as:  NORVASC Take 1 tablet (10 mg total) by mouth daily.   atorvastatin 40 MG tablet Commonly known as:  LIPITOR Take 1 tablet (40 mg total) by mouth daily.   hydrochlorothiazide 12.5 MG capsule Commonly known as:  MICROZIDE Take 1 capsule (12.5 mg total) by mouth daily.   lisinopril 40 MG tablet Commonly known as:  PRINIVIL,ZESTRIL Take 1 tablet (40 mg total) by mouth daily.      Follow-up Information    Wallace Going, DO Follow up on 09/05/2017.   Specialty:  Plastic Surgery Why:  APPOINTMENT ON 09/05/2017 AT 10:15AM Contact information: [address] [place] [address] [phone]        Jovita Gamma, MD Follow up in 2 week(s).   Specialty:  Neurosurgery Contact information: [address] [place] [address] [phone]        [facility] Follow up.   Specialty:  [place] Why:  A representative from [facility] will contact you to arrange start date and time for the Home Health Nurse to come out. Contact information: [place] [address] [phone]           Signed: Hosie Spangle 08/31/2017, 9:35 AM

## 2017-09-01 DIAGNOSIS — R202 Paresthesia of skin: Secondary | ICD-10-CM

## 2017-09-01 DIAGNOSIS — B957 Other staphylococcus as the cause of diseases classified elsewhere: Secondary | ICD-10-CM

## 2017-09-01 LAB — VANCOMYCIN, TROUGH: Vancomycin Tr: 10 ug/mL — ABNORMAL LOW (ref 15–20)

## 2017-09-01 MED ORDER — VANCOMYCIN HCL 10 G IV SOLR
1250.0000 mg | Freq: Two times a day (BID) | INTRAVENOUS | Status: DC
  Administered 2017-09-01: 1250 mg via INTRAVENOUS
  Filled 2017-09-01 (×2): qty 1250

## 2017-09-01 MED ORDER — ACETAMINOPHEN 500 MG PO TABS
1000.0000 mg | ORAL_TABLET | Freq: Four times a day (QID) | ORAL | Status: DC | PRN
  Administered 2017-09-01: 1000 mg via ORAL
  Filled 2017-09-01: qty 2

## 2017-09-01 MED ORDER — VANCOMYCIN IV (FOR PTA / DISCHARGE USE ONLY): 1250.0000 mg | Freq: Two times a day (BID) | INTRAVENOUS | 0 refills | Status: DC

## 2017-09-01 MED ORDER — HEPARIN SOD (PORK) LOCK FLUSH 100 UNIT/ML IV SOLN
250.0000 [IU] | INTRAVENOUS | Status: AC | PRN
  Administered 2017-09-01: 250 [IU]

## 2017-09-01 NOTE — Care Management (Addendum)
Case manager has spoken at length on 3/20//19 and 09/01/17 with patient's daughter Darrick Grinder concerning discharge plan. Scarlett had wanted her dad to go to SNF to receive IV antibiotics. Case manager explained that patient does not meet requirements for SNF placement and does not have a skilled need. CM explained to [place] and Lennette Bihari (patient's son), that they as family are going to need to arrange to give the antibiotics to their dad. They are not comfortable with this. CM explained that patient cannot remain in the hospital. Case manager discussed Private Duty, provided list. CM also called [facility] and [facility] to inquire about RN coming out to administer Antibiotics. Per policies no Home Health Agency will assume responsibility for administering medications, they instruct family and patient to do so, Case manager was informed that [place] had placed a call to [facility] herself with same question. CM explained to [place] and Lennette Bihari this is why family is instructed in administering meds. Both siblings say they have busy lives, and cannot do this twice a day. Scarlett lives in [place] and was encouraged to take her Dad there, but she says its too long a period of time. At this time Lennette Bihari said that they are going to file an appeal.  Case manager and floor Nurses have updated unit director, Blanchie Dessert, [place], VP Nursing. [facility] is in place for [facility] RN for PICC line Care when patient is discharged. CM will continue to monitor.

## 2017-09-01 NOTE — Progress Notes (Signed)
PHARMACY CONSULT NOTE FOR:  OUTPATIENT  PARENTERAL ANTIBIOTIC THERAPY (OPAT)  Indication: Skull Osteomyelitis Regimen: Ceftriaxone 2g IV Q24h and Vancomycin 1,250mg  IV Q12h End date: 10/10/17  *This is just an updated dose of the vancomycin based on a vancomycin trough that was drawn this morning being low. New OPAT order already signed.  Thank you for allowing pharmacy to be a part of this patient's care.  Reginia Naas 09/01/2017, 7:56 AM

## 2017-09-01 NOTE — Progress Notes (Signed)
Vitals:   08/31/17 2000 09/01/17 0000 09/01/17 0400 09/01/17 0746  BP: 131/85 117/62 139/68 (!) 145/76  Pulse: (!) 118 81 74 90  Resp: 18 18 18 18   Temp: 99 F (37.2 C) 98.7 F (37.1 C) 97.6 F (36.4 C) 98.9 F (37.2 C)  TempSrc: Oral Oral Oral Oral  SpO2: 98% 99% 100% 100%  Weight:      Height:        BMET Recent Labs    08/31/17 0500  NA 134*  K 3.4*  CL 99*  CO2 26  GLUCOSE 129*  BUN 10  CREATININE 0.95  CALCIUM 8.4*    Patient resting comfortably. Up and ambulating in the halls. Case management and nursing continuing to work on arrangements for home antibiotic therapy. Face-to-face completed.  Plan: Continues on antibiotic therapy.  Hosie Spangle, MD 09/01/2017, 11:29 AM

## 2017-09-01 NOTE — Progress Notes (Signed)
Patient alert  mae's well, voiding adequate amount of urine, swallowing without difficulty, no c/o pain at time of discharge. Patient and children were given extensive discharge instructions regarding wound care and patient dressing was change prior to discharge home and patient's daughter assisted with dressing change. Extra supplies given to patient for dressing change. Patient discharged home with family. Patient and family stated understanding of instructions given. Patient has an appointment with Dr. Surgeon in 1 week.

## 2017-09-01 NOTE — Progress Notes (Signed)
[facility] for Infectious Disease  Date of Admission:  08/29/2017     Follow up on: Osteomyelitis of skull flap with retained hardware       Total days of antibiotics 3   Vancomycin day 3  Ceftriaxone day 3         AASSESSMENT: Operative cultures growing 2 strains of staph epidermidis, a third coagulase negative staph and one gram positive rod that has yet to be identified. In speaking with our micro lab we may have an opportunity to get more information this afternoon. We will hopefully have enough information to craft a regimen to him that is financially doable and functionally doable for his family. Ideally once a day would be wonderful for him.      PLAN: 1. Continue vancomycin and ceftriaxone in house for now - hopeful to determine final verdict on regimen this afternoon.  2. Iron/metallic taste may be d/t saline flushes or medication infusing in PICC line as I have heard of this many times in patients requiring PICC infusions.  3. No weakness on exam with left hand. Sounds to be nerve-related. May be due to positioning on OR table? Not in affected extremity where PICC resides. Adequate neurovascular check. Advised to continue to monitor symptom.    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 amLODipine  10 mg Oral Daily  . atorvastatin  40 mg Oral q1800  . feeding supplement  1 Container Oral TID BM  . feeding supplement (ENSURE ENLIVE)  237 mL Oral TID BM  . hydrochlorothiazide  12.5 mg Oral Daily  . lisinopril  40 mg Oral Daily    SUBJECTIVE: Walking in the hallway and feeling better. Only concerns today are metallic taste in his mouth on occasion and some intermittent tingling in the left thumb, #2 and #3 fingers from time to time. PICC is in his right hand. Headaches are minimal and controlled well with tylenol.    Allergies  Allergen Reactions  . Other Rash    Needs Hypoallergenic Sheets/ Laundry Cart had questionable allergic reaction to  detergent last time he was here.    OBJECTIVE: Vitals:   08/31/17 2000 09/01/17 0000 09/01/17 0400 09/01/17 0746  BP: 131/85 117/62 139/68 (!) 145/76  Pulse: (!) 118 81 74 90  Resp: 18 18 18 18   Temp: 99 F (37.2 C) 98.7 F (37.1 C) 97.6 F (36.4 C) 98.9 F (37.2 C)  TempSrc: Oral Oral Oral Oral  SpO2: 98% 99% 100% 100%  Weight:      Height:       Body mass index is 23.96 kg/m.  Physical Exam  Constitutional: He is oriented to person, place, and time and well-developed, well-nourished, and in no distress.  Walking in the hall and in good spirits.   HENT:  Mouth/Throat: Oropharynx is clear and moist. No oral lesions. Normal dentition. No dental caries.  Head dressing clean and dry.   Eyes: No scleral icterus.  Healing bruised to right eye   Neck: Normal range of motion.  Cardiovascular: Normal rate and regular rhythm.  Pulmonary/Chest: Effort normal.  Abdominal: Soft. He exhibits no distension. There is no tenderness.  Musculoskeletal: Normal range of motion.  Ambulating in hall without aid. Steady, even gait.   Neurological: He is alert and oriented to person, place, and time. No cranial nerve deficit. Coordination normal.  Skin: Skin is warm and dry. No rash noted.  Psychiatric: Mood and affect  normal.  Vitals reviewed.   Lab Results Lab Results  Component Value Date   WBC 8.5 08/24/2017   HGB 15.2 08/24/2017   HCT 44.3 08/24/2017   MCV 91.0 08/24/2017   PLT 255 08/24/2017    Lab Results  Component Value Date   CREATININE 0.95 08/31/2017   BUN 10 08/31/2017   NA 134 (L) 08/31/2017   K 3.4 (L) 08/31/2017   CL 99 (L) 08/31/2017   CO2 26 08/31/2017    Lab Results  Component Value Date   ALT 20 08/24/2017   AST 23 08/24/2017   ALKPHOS 71 08/24/2017   BILITOT 0.9 08/24/2017     Microbiology: BCx 3/13 >> NG x 5d Skull Fragments 08/29/17 >> Coag Negative staph species, staph epidermidis (2) and Gram positive rods.    Janene Madeira, MSN,  NP-C [facility] [place] Cell: [phone] Pager: [phone]  09/01/2017  11:17 AM

## 2017-09-01 NOTE — Progress Notes (Signed)
Pharmacy Antibiotic Note  Martin Edwards is a 72 y.o. male admitted on 08/29/2017 with skull osteomyelitis. Vancomycin trough returned subtherapeutic this morning at 10.   Plan: -Increase vancomycin to 1250 mg IV q12h -Ceftriaxone 2 g IV q12h -Monitor renal fx, cultures, VT as needed   Height: 5\' 10"  (177.8 cm) Weight: 167 lb (75.8 kg) IBW/kg (Calculated) : 73  Temp (24hrs), Avg:98.8 F (37.1 C), Min:97.6 F (36.4 C), Max:100.3 F (37.9 C)  Recent Labs  Lab 08/31/17 0500 09/01/17 0441  CREATININE 0.95  --   VANCOTROUGH  --  10*    Estimated Creatinine Clearance: 72.6 mL/min (by C-G formula based on SCr of 0.95 mg/dL).    Allergies  Allergen Reactions  . Other Rash    Needs Hypoallergenic Sheets/ Laundry Cart had questionable allergic reaction to detergent last time he was here.    Antimicrobials this admission: 3/18 ceftriaxone > 3/18 vancomycin >  Dose adjustments this admission: N/A  Microbiology results: 3/18 bone tissue: GPC  MastersJake Church 09/01/2017 7:13 AM

## 2017-09-02 ENCOUNTER — Telehealth: Payer: Self-pay | Admitting: Infectious Diseases

## 2017-09-02 ENCOUNTER — Other Ambulatory Visit: Payer: Self-pay | Admitting: Infectious Diseases

## 2017-09-02 ENCOUNTER — Other Ambulatory Visit: Payer: Self-pay | Admitting: Internal Medicine

## 2017-09-02 MED ORDER — ONDANSETRON HCL 4 MG PO TABS: 4.0000 mg | ORAL_TABLET | Freq: Three times a day (TID) | ORAL | 0 refills | Status: DC | PRN

## 2017-09-02 NOTE — Telephone Encounter (Signed)
Cultures growing coagulase negative staph species that are sensitive to all but tetracyclines. Also corynebacterium species growing. Will continue with Vancomycin alone and stop ceftriaxone for coverage of both organisms in a way that will work for his family.   Dr. Megan Salon called and discussed with his daughter [name] over the phone and we will inform [facility] team as well. She informed us that her dad is now having some trouble with nausea - we will send in Rx for zofran as this is new for him. She also describes he is reaching for objects and "missing them." No dizziness and no falls. If this worsens would notify his neurosurgeron of this change. Also encouraged her to reach out to our clinic directly for further updates.   Janene Madeira, NP

## 2017-09-03 LAB — AEROBIC/ANAEROBIC CULTURE W GRAM STAIN (SURGICAL/DEEP WOUND)

## 2017-09-05 ENCOUNTER — Encounter (HOSPITAL_COMMUNITY): Payer: Self-pay | Admitting: Emergency Medicine

## 2017-09-05 ENCOUNTER — Telehealth: Payer: Self-pay | Admitting: Pharmacist

## 2017-09-05 ENCOUNTER — Emergency Department (HOSPITAL_COMMUNITY): Payer: Medicare Other

## 2017-09-05 ENCOUNTER — Observation Stay (HOSPITAL_COMMUNITY): Payer: Medicare Other

## 2017-09-05 ENCOUNTER — Telehealth: Payer: Self-pay | Admitting: Neurology

## 2017-09-05 ENCOUNTER — Observation Stay (HOSPITAL_COMMUNITY)
Admission: EM | Admit: 2017-09-05 | Discharge: 2017-09-07 | Disposition: A | Discharge status: Home / Self Care | Payer: Medicare Other | Attending: Internal Medicine | Admitting: Internal Medicine

## 2017-09-05 DIAGNOSIS — R482 Apraxia: Secondary | ICD-10-CM

## 2017-09-05 DIAGNOSIS — Z87891 Personal history of nicotine dependence: Secondary | ICD-10-CM | POA: Diagnosis not present

## 2017-09-05 DIAGNOSIS — Z8673 Personal history of transient ischemic attack (TIA), and cerebral infarction without residual deficits: Secondary | ICD-10-CM | POA: Insufficient documentation

## 2017-09-05 DIAGNOSIS — Z8546 Personal history of malignant neoplasm of prostate: Secondary | ICD-10-CM | POA: Insufficient documentation

## 2017-09-05 DIAGNOSIS — R29898 Other symptoms and signs involving the musculoskeletal system: Secondary | ICD-10-CM

## 2017-09-05 DIAGNOSIS — R410 Disorientation, unspecified: Secondary | ICD-10-CM | POA: Diagnosis not present

## 2017-09-05 DIAGNOSIS — M869 Osteomyelitis, unspecified: Secondary | ICD-10-CM

## 2017-09-05 DIAGNOSIS — R4182 Altered mental status, unspecified: Secondary | ICD-10-CM | POA: Diagnosis present

## 2017-09-05 DIAGNOSIS — I10 Essential (primary) hypertension: Secondary | ICD-10-CM | POA: Diagnosis not present

## 2017-09-05 DIAGNOSIS — G9340 Encephalopathy, unspecified: Secondary | ICD-10-CM | POA: Diagnosis not present

## 2017-09-05 DIAGNOSIS — Z79899 Other long term (current) drug therapy: Secondary | ICD-10-CM | POA: Diagnosis not present

## 2017-09-05 DIAGNOSIS — R531 Weakness: Secondary | ICD-10-CM | POA: Diagnosis present

## 2017-09-05 LAB — CBC WITH DIFFERENTIAL/PLATELET
BASOS PCT: 0 %
Basophils Absolute: 0 10*3/uL (ref 0.0–0.1)
Eosinophils Absolute: 0 10*3/uL (ref 0.0–0.7)
Eosinophils Relative: 0 %
HEMATOCRIT: 43.1 % (ref 39.0–52.0)
HEMOGLOBIN: 14.4 g/dL (ref 13.0–17.0)
Lymphocytes Relative: 17 %
Lymphs Abs: 1.6 10*3/uL (ref 0.7–4.0)
MCH: 31 pg (ref 26.0–34.0)
MCHC: 33.4 g/dL (ref 30.0–36.0)
MCV: 92.7 fL (ref 78.0–100.0)
Monocytes Absolute: 0.6 10*3/uL (ref 0.1–1.0)
Monocytes Relative: 6 %
NEUTROS ABS: 7.1 10*3/uL (ref 1.7–7.7)
NEUTROS PCT: 77 %
Platelets: 249 10*3/uL (ref 150–400)
RBC: 4.65 MIL/uL (ref 4.22–5.81)
RDW: 13.6 % (ref 11.5–15.5)
WBC: 9.4 10*3/uL (ref 4.0–10.5)

## 2017-09-05 LAB — COMPREHENSIVE METABOLIC PANEL
ALBUMIN: 3.9 g/dL (ref 3.5–5.0)
ALT: 60 U/L (ref 17–63)
AST: 44 U/L — AB (ref 15–41)
Alkaline Phosphatase: 78 U/L (ref 38–126)
Anion gap: 14 (ref 5–15)
BUN: 17 mg/dL (ref 6–20)
CHLORIDE: 98 mmol/L — AB (ref 101–111)
CO2: 23 mmol/L (ref 22–32)
Calcium: 9.5 mg/dL (ref 8.9–10.3)
Creatinine, Ser: 0.95 mg/dL (ref 0.61–1.24)
GFR calc Af Amer: >60 mL/min (ref >60)
GLUCOSE: 126 mg/dL — AB (ref 65–99)
POTASSIUM: 4.1 mmol/L (ref 3.5–5.1)
Sodium: 135 mmol/L (ref 135–145)
Total Bilirubin: 1.1 mg/dL (ref 0.3–1.2)
Total Protein: 7.5 g/dL (ref 6.5–8.1)

## 2017-09-05 LAB — I-STAT CG4 LACTIC ACID, ED
LACTIC ACID, VENOUS: 2.04 mmol/L — AB (ref 0.5–1.9)
LACTIC ACID, VENOUS: 1.24 mmol/L (ref 0.5–1.9)

## 2017-09-05 LAB — PROTIME-INR
INR: 0.92
Prothrombin Time: 12.3 seconds (ref 11.4–15.2)

## 2017-09-05 MED ORDER — VANCOMYCIN IV (FOR PTA / DISCHARGE USE ONLY): 1250.0000 mg | Freq: Two times a day (BID) | INTRAVENOUS | Status: DC

## 2017-09-05 MED ORDER — HYDROCHLOROTHIAZIDE 12.5 MG PO CAPS
12.5000 mg | ORAL_CAPSULE | Freq: Every day | ORAL | Status: DC
  Administered 2017-09-06 – 2017-09-07 (×2): 12.5 mg via ORAL
  Filled 2017-09-05 (×2): qty 1

## 2017-09-05 MED ORDER — LORAZEPAM 2 MG/ML IJ SOLN
1.0000 mg | Freq: Once | INTRAMUSCULAR | Status: AC
  Administered 2017-09-05: 1 mg via INTRAVENOUS
  Filled 2017-09-05: qty 1

## 2017-09-05 MED ORDER — ATORVASTATIN CALCIUM 40 MG PO TABS
40.0000 mg | ORAL_TABLET | Freq: Every day | ORAL | Status: DC
  Administered 2017-09-06 – 2017-09-07 (×2): 40 mg via ORAL
  Filled 2017-09-05 (×3): qty 1

## 2017-09-05 MED ORDER — VANCOMYCIN HCL 10 G IV SOLR
1250.0000 mg | Freq: Two times a day (BID) | INTRAVENOUS | Status: DC
  Administered 2017-09-05 – 2017-09-07 (×4): 1250 mg via INTRAVENOUS
  Filled 2017-09-05 (×5): qty 1250

## 2017-09-05 MED ORDER — ACETAMINOPHEN 325 MG PO TABS: 650.0000 mg | ORAL_TABLET | Freq: Four times a day (QID) | ORAL | Status: DC | PRN

## 2017-09-05 MED ORDER — CEFTRIAXONE IV (FOR PTA / DISCHARGE USE ONLY): 2.0000 g | INTRAVENOUS | Status: DC

## 2017-09-05 MED ORDER — LISINOPRIL 20 MG PO TABS
40.0000 mg | ORAL_TABLET | Freq: Every day | ORAL | Status: DC
  Administered 2017-09-06 – 2017-09-07 (×2): 40 mg via ORAL
  Filled 2017-09-05 (×2): qty 2

## 2017-09-05 MED ORDER — SENNOSIDES-DOCUSATE SODIUM 8.6-50 MG PO TABS: 1.0000 | ORAL_TABLET | Freq: Every evening | ORAL | Status: DC | PRN

## 2017-09-05 MED ORDER — AMLODIPINE BESYLATE 10 MG PO TABS
10.0000 mg | ORAL_TABLET | Freq: Every day | ORAL | Status: DC
  Administered 2017-09-06 – 2017-09-07 (×2): 10 mg via ORAL
  Filled 2017-09-05 (×2): qty 1

## 2017-09-05 MED ORDER — ACETAMINOPHEN 650 MG RE SUPP: 650.0000 mg | Freq: Four times a day (QID) | RECTAL | Status: DC | PRN

## 2017-09-05 MED ORDER — GADOBENATE DIMEGLUMINE 529 MG/ML IV SOLN
16.0000 mL | Freq: Once | INTRAVENOUS | Status: AC | PRN
  Administered 2017-09-05: 16 mL via INTRAVENOUS

## 2017-09-05 MED ORDER — ONDANSETRON HCL 4 MG PO TABS: 4.0000 mg | ORAL_TABLET | Freq: Three times a day (TID) | ORAL | Status: DC | PRN

## 2017-09-05 NOTE — ED Triage Notes (Signed)
Pt was discharge home on IV antibiotics after having a surgery to remove infected skull flap per daughter. Per daughter pt began to have weakness in left arm prior to leaving hospital on Thursday and this has gotten worse.  Pt is unable to squeeze left hand and has decreased sensation, no other weakness noted. Pt is alert and ox4. Pt denies any pain.

## 2017-09-05 NOTE — ED Notes (Signed)
Dinner tray delivered.

## 2017-09-05 NOTE — ED Notes (Signed)
Pt given crackers and a coke, ok by EDP

## 2017-09-05 NOTE — ED Notes (Signed)
Returned from MRI 

## 2017-09-05 NOTE — ED Provider Notes (Addendum)
[facility] EMERGENCY DEPARTMENT Provider Note   CSN: [id number] Arrival date & time: 09/05/17  1001     History   Chief Complaint Chief Complaint  Patient presents with  . Weakness    HPI Martin Edwards is a 72 y.o. male.  HPI Patient presents with increasing difficulty using his left arm and increased confusion.  Had recent cranial surgery for infected bone flap in his right temporal area.  Done by Dr. Sherwood Gambler and Dr. Marla Roe  1 week ago. He is on antibiotics through PICC line.  No fevers.  Has had some confusion.  Reportedly woke up this morning and stated that he did not know how to get out of bed.  Then said he did not know how to drink his coffee.  Martin Edwards he was going to need a straw.  Has had difficulty moving his left hand also.  Is been a little unsteady.  No seizure activity.  Had previous stroke.  Had seizures with initial head injury 3 years ago but none since.  No neck pain.  Has been ambulatory but patient states he is a little unsteady. Past Medical History:  Diagnosis Date  . Basal cell carcinoma of face X 2  . GERD (gastroesophageal reflux disease)   . History of kidney stones   . Hyperlipidemia   . Hypertension   . Prostate cancer ([place]) 1999  . Seizures ([place]) 11/2014   brain trauma -hematoma  . Small bowel obstruction due to adhesions ([place]) 01/30/2014  . Subdural hematoma [facility])     Patient Active Problem List   Diagnosis Date Noted  . 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 History of cranioplasty 03/07/2015  . Seizures ([place]) 11/27/2014  . Abnormal LFTs 11/27/2014  . UTI (urinary tract infection), bacterial 11/27/2014  . 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 Left-sided sensory deficit present 11/19/2014  . Sebaceous cyst x2 01/04/2014  . Prostate cancer ([place]) 12/12/2012  . Hypertension 10/21/2011  . Hyperlipidemia 10/21/2011  . Adenomatous colon polyp 10/21/2011  . Postop check 12/29/2010    Past Surgical History:  Procedure  Laterality Date  . APPLICATION OF A-CELL OF HEAD/NECK N/A 08/29/2017   Procedure: APPLICATION OF A-CELL;  Surgeon: Wallace Going, DO;  Location: [facility];  Service: Plastics;  Laterality: N/A;  . BASAL CELL CARCINOMA EXCISION  X 2   "off my face both times"  . BRAIN SURGERY    . COLECTOMY  10/2010   "took out 8 inches of colon; wasn't cancer"  . CRANIOTOMY Right 11/11/2014   Procedure: CRANIOTOMY HEMATOMA EVACUATION SUBDURAL;  Surgeon: Leeroy Cha, MD;  Location: [facility];  Service: Neurosurgery;  Laterality: Right;  . CRANIOTOMY Right 11/13/2014   Procedure: Removal bone flap, insertion into the abdominal wall;  Surgeon: Leeroy Cha, MD;  Location: [facility];  Service: Neurosurgery;  Laterality: Right;  . CRANIOTOMY N/A 03/07/2015   Procedure: Cranioplasty using Bone flap in abdomen;  Surgeon: Leeroy Cha, MD;  Location: [facility];  Service: Neurosurgery;  Laterality: N/A;  Cranioplasty using Bone flap in abdomen  . CRANIOTOMY N/A 08/29/2017   Procedure: CRANIECTOMY AND DEBRIDEMENT;  Surgeon: Jovita Gamma, MD;  Location: [facility];  Service: Neurosurgery;  Laterality: N/A;  CRANIECTOMY AND DEBRIDEMENT  . EXCISION MASS NECK Right 04/14/2016   Procedure: EXCISION BIOPSY OF RIGHT NECK LYMPH NODE;  Surgeon: Jerrell Belfast, MD;  Location: [facility];  Service: ENT;  Laterality: Right;  . EXCISION MELANOMA WITH SENTINEL LYMPH NODE BIOPSY Right  04/14/2016   Procedure: WIDE EXCISION MELANOMA RIGHT SHOULDER WITH SENTINEL  NODE MAPPING;  Surgeon: Autumn Messing III, MD;  Location: [facility];  Service: General;  Laterality: Right;  . PROSTATECTOMY  05/20/1998  . SCALP LACERATION REPAIR N/A 08/29/2017   Procedure: SCALP FLAP TO COVER DEFECT;  Surgeon: Wallace Going, DO;  Location: [facility];  Service: Plastics;  Laterality: N/A;        Home Medications    Prior to Admission medications   Medication Sig Start Date End Date Taking? Authorizing Provider  amLODipine (NORVASC) 10 MG tablet Take 1  tablet (10 mg total) by mouth daily. 11/11/16   Horald Pollen, MD  atorvastatin (LIPITOR) 40 MG tablet Take 1 tablet (40 mg total) by mouth daily. 11/11/16   Horald Pollen, MD  cefTRIAXone (ROCEPHIN) IVPB Inject 2 g into the vein daily. Indication:  osteomyelitis Last Day of Therapy:  10/10/17 Labs - Once weekly:  CBC/D and BMP, Labs - Every other week:  ESR and CRP 08/31/17   Jovita Gamma, MD  hydrochlorothiazide (MICROZIDE) 12.5 MG capsule Take 1 capsule (12.5 mg total) by mouth daily. 11/11/16   Horald Pollen, MD  lisinopril (PRINIVIL,ZESTRIL) 40 MG tablet Take 1 tablet (40 mg total) by mouth daily. 11/11/16   Horald Pollen, MD  ondansetron (ZOFRAN) 4 MG tablet Take 1 tablet (4 mg total) by mouth every 8 (eight) hours as needed for nausea or vomiting. 09/02/17   Chestnut Callas, NP  vancomycin IVPB Inject 1,250 mg into the vein every 12 (twelve) hours. Indication: osteomyelitis Last Day of Therapy: 10/10/17 Labs - Sunday/Monday:  CBC/D, BMP, and vancomycin trough. Labs - Thursday:  BMP and vancomycin trough Labs - Every other week:  ESR and CRP 09/01/17   Jovita Gamma, MD    Family History Family History  Problem Relation Age of Onset  . Cancer Father        Lung    Social History Social History   Tobacco Use  . Smoking status: Former Research scientist (life sciences)  . Smokeless tobacco: Never Used  Substance Use Topics  . Alcohol use: No    Alcohol/week: 0.0 oz    Comment: 01/30/2014 "maybe a beer twice/yr"  . Drug use: No     Allergies   Other   Review of Systems Review of Systems  Constitutional: Negative for appetite change and fever.  HENT: Negative for congestion.   Respiratory: Negative for choking and shortness of breath.   Cardiovascular: Negative for chest pain.  Gastrointestinal: Negative for abdominal pain.  Genitourinary: Negative for flank pain.  Musculoskeletal: Negative for back pain.  Skin: Positive for wound.  Neurological: Positive for  weakness and numbness. Negative for headaches.  Psychiatric/Behavioral: Positive for confusion.     Physical Exam Updated Vital Signs BP 128/65   Pulse 86   Temp 99 F (37.2 C) (Oral)   Resp 11   Ht _0  (1.778 m)   Wt 76.2 kg (168 lb)   SpO2 99%   BMI 24.11 kg/m   Physical Exam  Constitutional: He appears well-developed.  HENT:  Head: Normocephalic.  Post craniotomy to right temporoparietal area.  Wound intact.  Eyes: EOM are normal.  Neck: Neck supple.  Cardiovascular: Normal rate.  Pulmonary/Chest: He has no wheezes.  Abdominal: There is no tenderness.  Musculoskeletal: He exhibits no edema.  Neurological: He is alert.  Awake and pleasant but mildly slow to answer.  Difficulty moving left upper extremity.  Is able to do  finger-nose but noticeably slower than contralateral side.  Good straight leg raise bilaterally.  Face symmetric.  Good grip strength bilaterally but may be weaker on left compared to right.  Skin: Skin is warm. Capillary refill takes less than 2 seconds.     ED Treatments / Results  Labs (all labs ordered are listed, but only abnormal results are displayed) Labs Reviewed  COMPREHENSIVE METABOLIC PANEL - Abnormal; Notable for the following components:      Result Value   Chloride 98 (*)    Glucose, Bld 126 (*)    AST 44 (*)    All other components within normal limits  I-STAT CG4 LACTIC ACID, ED - Abnormal; Notable for the following components:   Lactic Acid, Venous 2.04 (*)    All other components within normal limits  CBC WITH DIFFERENTIAL/PLATELET  PROTIME-INR  I-STAT CG4 LACTIC ACID, ED    EKG None  Radiology Ct Head Wo Contrast  Result Date: 09/05/2017 CLINICAL DATA:  Left arm weakness. Recent craniectomy on 08/29/2017 for osteomyelitis of the skull. EXAM: CT HEAD WITHOUT CONTRAST TECHNIQUE: Contiguous axial images were obtained from the base of the skull through the vertex without intravenous contrast. COMPARISON:  08/25/2017 head  CT and MRI FINDINGS: Brain: Sequelae of interval craniectomy are identified with resection of a portion of the right frontoparietal bone flap. A new thin subdural hematoma over the right cerebral convexity measures 3 mm in thickness in the parietal region without associated mass effect. Small regions of encephalomalacia are again noted in the right parietal and right frontal lobes. There is no evidence of acute large territory infarct, brain edema, or midline shift. The ventricles are normal in size scratched of there is mild cerebral atrophy. Vascular: Calcified atherosclerosis at the skull base. No hyperdense vessel. Skull: Postsurgical changes from prior craniectomy. Gas and mild swelling in the overlying scalp soft tissues. Sinuses/Orbits: Unremarkable orbits. Mild mucosal thickening in the ethmoid air cells bilaterally. Small volume secretions in the sphenoid sinuses. Clear mastoid air cells. Other: None. IMPRESSION: 1. Interval right-sided craniectomy with trace subdural hematoma over the right cerebral convexity. No mass effect. 2. 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  No evidence of acute infarct. Electronically Signed   By: Logan Bores M.D.   On: 09/05/2017 14:30    Procedures Procedures (including critical care time)  Medications Ordered in ED Medications - No data to display   Initial Impression / Assessment and Plan / ED Course  I have reviewed the triage vital signs and the nursing notes.  Pertinent labs & imaging results that were available during my care of the patient were reviewed by me and considered in my medical decision making (see chart for details).     Patient presents for encephalopathy and left upper extremity weakness.  1 week ago had craniotomy for infected skull flap.  Has had no fevers.  Is on vancomycin.  Over the last week has had some worsening mental status and weakness on his left arm.  Had worsening of the same this morning.  Head CT reassuring.  Lab work  reassuring.  Discussed with Dr. Sherwood Gambler.  Does not appear to anything surgical at this time.  Has had previous seizures and subclinical seizures are considered.  With worsening mental status Dr. Sherwood Gambler feels he would potentially benefit from admission.  May end up even needing placement if mental status continues to be an issue.  May need neurology evaluation and MRI.  Will admit to unassigned internal medicine.  I discussed with  Dr. Lorraine Lax after internal medicine residents requested consult.  He states he does not need to be involved yet because neurosurgery is going to be involved.  States if they want him involved to give him a call.  Final Clinical Impressions(s) / ED Diagnoses   Final diagnoses:  Encephalopathy  Left arm weakness    ED Discharge Orders    None       Davonna Belling, MD 09/05/17 1542    Davonna Belling, MD 09/05/17 [phone]

## 2017-09-05 NOTE — Progress Notes (Signed)
Pharmacy Antibiotic Note  Martin Edwards is a 72 y.o. 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  He was s/p craniectomy and debridement last admission on 08/29/17 and was discharged home on vancomycin and ceftriaxone.  There were confusion regarding vancomycin dosing and patient has been on 1250mg  IV Q24H.  Per last admit, he was sub-therapeutic on vancomycin 1gm IV Q12H and the dose was increased to 1250mg  IV Q12H prior to discharge.  Plan was to treat through 10/10/17.  ID clinic pharmacist note from earlier today stated that Rocephin was discontinued on 09/02/17 per Dr. Megan Salon.  Patient admitted on 09/05/2017 with confusion.  Pharmacy has been consulted to continue vancomycin from previous admission.  Patient's renal function is stable.  Afebrile, WBC WNL, LA down to 1.24.   Plan: Vanc 1250mg  IV Q12H Monitor renal fxn, clinical progress, vanc trough at [facility]   Height: 5\' 10"  (177.8 cm) Weight: 168 lb (76.2 kg) IBW/kg (Calculated) : 73  Temp (24hrs), Avg:99 F (37.2 C), Min:99 F (37.2 C), Max:99 F (37.2 C)  Recent Labs  Lab 08/31/17 0500 09/01/17 0441 09/05/17 1022 09/05/17 1031 09/05/17 1332  WBC  --   --  9.4  --   --   CREATININE 0.95  --  0.95  --   --   LATICACIDVEN  --   --   --  2.04* 1.24  VANCOTROUGH  --  10*  --   --   --     Estimated Creatinine Clearance: 72.6 mL/min (by C-G formula based on SCr of 0.95 mg/dL).    Allergies  Allergen Reactions  . Other Rash    Needs Hypoallergenic Sheets/ Laundry Cart had questionable allergic reaction to detergent last time he was here.     Vanc PTA 3/18 >> (4/29) CTX PTA 3/18 >> 3/22  3/21 VT = 10 mcg/mL on 1g q12 >> 1250mg  q12  3/18 skull bone cx - CoNS + Corynebacterium spp    Jenniefer Salak D. Mina Marble, PharmD, BCPS Pager:  [phone] 09/05/2017, 9:28 PM

## 2017-09-05 NOTE — Telephone Encounter (Addendum)
Late note:  Per verbal from Dr. Megan Salon, called Jeani Hawking at [facility] last Friday on 3/22 and gave verbal orders to d/c ceftriaxone but continue the vancomycin. Jeani Hawking verbalized understanding.

## 2017-09-05 NOTE — Progress Notes (Signed)
[facility]  Pt is an active pt with [facility] for home [facility] team will follow pt while here to support DC home when ordered.  If patient discharges after hours, please call [phone].   Larry Sierras 09/05/2017, 10:17 PM

## 2017-09-05 NOTE — ED Notes (Signed)
Spoke with IM resident and they advised the antibiotics would be continued at a later time.

## 2017-09-05 NOTE — ED Notes (Signed)
Patient transported to MRI 

## 2017-09-05 NOTE — ED Notes (Signed)
Son, Lennette Bihari, phone number [phone] ; would like to be notified of CT results when available

## 2017-09-05 NOTE — Telephone Encounter (Signed)
I called the daughter.  The patient had surgery last week to remove a bone flap, they claim that since the surgery he has had problems with slurring his speech and using his left hand and arm.  This has worsened this morning.  It is not clear that Dr. Lucia Gaskins is aware of this.  I have indicated that they should go through the emergency room for an urgent evaluation.  Dr. Lucia Gaskins does need to be aware of this issue.  I will send him a note.

## 2017-09-05 NOTE — Telephone Encounter (Signed)
Pt's daughter called he got an infection with the bone flap and had surgery 1 week ago to remove part of the bone flap. He is on a strong antibiotic presently. Daughter said since the surgery he is unable to use the left hand, sensation in the hand feels different, he is having cognitive issues (is unable to preform some simple tasks) and intermittent slurring of his words. This morning he told her he felt weird. She said they are on the way to [facility] for appt with plastic surgeon. She was wanting him to be seen today by Dr Jannifer Franklin, I advised her the clinic does not triage and if she feels he's had a stroke to take him to the ED. She is unsure of what to do at this time. She will call Dr Donnella Bi office when it opens at [facility]. She is wanting RN to call her back.

## 2017-09-05 NOTE — ED Notes (Signed)
PAGED ADMITTING PER RN  

## 2017-09-05 NOTE — H&P (Signed)
Date: 09/05/2017               Patient Name:  Martin Edwards MRN: [id number]  DOB: 1945/10/13 Age / Sex: 72 y.o., male   PCP: Horald Pollen, MD         Medical Service: Internal Medicine Teaching Service         Attending Physician: Dr. Oda Kilts, MD    First Contact: Dr. Aggie Hacker Pager: [phone]  Second Contact: Dr. Danford Bad Pager: [phone]       After Hours (After 5p/  First Contact Pager: [phone]  weekends / holidays): Second Contact Pager: [phone]   Chief Complaint: confusion  History of Present Illness:  72 yo male with PMH significant for HTN, HLD, GERD,  subdural hematoma in May 2016 s/p craniotomy and evacuation, and recent debridement of osteomyelitis of the skull with scalp and dural defect(08/29/2017). He was discharged on 08/31/2016 with a PICC line and been treated with vancomycin and rocephin for six week duration (end date 10/11/2017). Overall, patient did well post-operatively per chart review. Since his surgery and discharge the patient has been experiencing left sided hand/arm weakness, left sided slurred speech and difficulty chewing, drooling and difficulty with ambulation. He is accompanied by his daughter who states that since the morning of admission he has seemed more confused. She states that he woke up this morning and stated he did not feel like himself. The patient cannot give specifics about how he felt. His daughter states that he seemed to be  struggling with simple tasks that he does not typically have issues with, including drinking coffee, walking, or standing/sitting at the kitchen table. Daughter is most concerned about cognitive dysfunction.   He denies fever, chills, chest pain, shortness of breath, dysuria, or hematuria. Denies pain at surgical site or picc line site. He has been receiving his antibiotics as prescribed, no missed doses. He has a history of seizures after his subdural hematoma in 2016. He was on antiepileptics for about one  year, these have since been discontinued and he has had no seizure activity since.  He has no stroke history. Does not take ASA or anticoagulation.     ED Course: Vitals: BP 128/65, pulse 86, temperature 99 F (37.2 C), temperature source Oral, resp. rate 11, SpO2 99 %. Labs: CBC unremarkable, CMET unremarkable, mildly elevated AST 44 Meds: none Imaging: CT scan of head with interval right-sided craniectomy with trace subdural hematoma over the right cerebral convexity. No mass effect. 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  No evidence of acute infarct.  Meds:  No outpatient medications have been marked as taking for the 09/05/17 encounter [facility] Encounter).     Allergies: Allergies as of 09/05/2017 - Review Complete 09/05/2017  Allergen Reaction Noted  . Other Rash 03/03/2015   Past Medical History:  Diagnosis Date  . Basal cell carcinoma of face X 2  . GERD (gastroesophageal reflux disease)   . History of kidney stones   . Hyperlipidemia   . Hypertension   . Prostate cancer ([place]) 1999  . Seizures ([place]) 11/2014   brain trauma -hematoma  . Small bowel obstruction due to adhesions ([place]) 01/30/2014  . Subdural hematoma (HCC)     Family History:  Family History  Problem Relation Age of Onset  . Cancer Father        Lung    Social History:  Social History   Tobacco Use  . Smoking status: Former Research scientist (life sciences)  . Smokeless tobacco: Never  Used  Substance Use Topics  . Alcohol use: No    Alcohol/week: 0.0 oz    Comment: 01/30/2014 "maybe a beer twice/yr"  . Drug use: No    Review of Systems: A complete ROS was negative except as per HPI.   Physical Exam: Blood pressure (!) 170/92, pulse (!) 110, temperature 99 F (37.2 C), temperature source Oral, resp. rate 17, height 5\' 10"  (1.778 m), weight 168 lb (76.2 kg), SpO2 100 %. Physical Exam  Constitutional: He is oriented to person, place, and time. He appears well-developed and well-nourished. No distress.  HENT:  Dressings  placed, appear dry  Eyes: Pupils are equal, round, and reactive to light. Conjunctivae are normal. No scleral icterus. Right eye exhibits nystagmus. Left eye exhibits nystagmus.  Right sided ptosis, mild periorbital swelling  Neck: Normal range of motion. Neck supple.  Cardiovascular: Normal rate, regular rhythm and normal heart sounds.  Pulmonary/Chest: Effort normal and breath sounds normal. No respiratory distress.  CTAB in anterior lung fields  Abdominal: Soft. Bowel sounds are normal. He exhibits no distension. There is no tenderness.  Musculoskeletal: Normal range of motion.  Neurological: He is alert and oriented to person, place, and time. He has normal reflexes. No cranial nerve deficit or sensory deficit. Coordination normal.  Strength:  Upper Extremity: 4/5 strength in upper extremities bilaterally (patient seemed to have difficulty following directions intermittently) Lower Extremity: 5/5 strength in proximal and distal extremities bilaterally   Skin: Skin is warm and dry. He is not diaphoretic.  Psychiatric: He has a normal mood and affect.    EKG: none to review   CXR: none to review  Assessment & Plan by Problem: Principal Problem:   Altered mental status Active Problems:   Osteomyelitis of skull (HCC)  Confusion and Left Sided Weakness  Patient recently underwent debridement of osteomyelitis of the skull with scalp and dural defect on 3/18. Vital signs are stable no signs of infection at this time, patient has been adherent with abx. CT scan of the head with interval trace subdural hematoma over the right cerebral convexity, no mass effect. No evidence of acute infarct. Patient AOx3, neurological exam without focal neurological deficits, but patient did have intermittent confusion following commands on left side. No dysmetria. Also having difficulty with executive functioning per daughter.   -MRI Brain WO Contrast - STAT  -Continue IV Vanc and Rocephin  -Given recent  surgery will need to involved either Neurosurgery and/or Dr. Marla Roe  -Frequent neurochecks   HTN, HLD Mildly hypertensive, BP 148/79.  -Will continue amlodipine 10 mg and Lisinopril 40 mg tomorrow  -Continue Lipitor 40 mg daily   Code: Full code DVT ppx: SCDs  Dispo: Admit patient to Inpatient with expected length of stay greater than 2 midnights.  Signed: Melanee Spry, MD 09/05/2017, 6:15 PM  Pager: [phone]

## 2017-09-05 NOTE — ED Notes (Signed)
[place], pt's son would like to be called in the advent the pt gets a room.

## 2017-09-06 ENCOUNTER — Other Ambulatory Visit: Payer: Self-pay | Admitting: Pharmacist

## 2017-09-06 ENCOUNTER — Observation Stay (HOSPITAL_COMMUNITY): Payer: Medicare Other

## 2017-09-06 DIAGNOSIS — Z9109 Other allergy status, other than to drugs and biological substances: Secondary | ICD-10-CM | POA: Diagnosis not present

## 2017-09-06 DIAGNOSIS — M8668 Other chronic osteomyelitis, other site: Secondary | ICD-10-CM

## 2017-09-06 DIAGNOSIS — R410 Disorientation, unspecified: Secondary | ICD-10-CM | POA: Diagnosis not present

## 2017-09-06 DIAGNOSIS — R404 Transient alteration of awareness: Secondary | ICD-10-CM | POA: Diagnosis not present

## 2017-09-06 DIAGNOSIS — Z79899 Other long term (current) drug therapy: Secondary | ICD-10-CM | POA: Diagnosis not present

## 2017-09-06 DIAGNOSIS — B9689 Other specified bacterial agents as the cause of diseases classified elsewhere: Secondary | ICD-10-CM | POA: Diagnosis not present

## 2017-09-06 DIAGNOSIS — Z91048 Other nonmedicinal substance allergy status: Secondary | ICD-10-CM | POA: Diagnosis not present

## 2017-09-06 DIAGNOSIS — M869 Osteomyelitis, unspecified: Secondary | ICD-10-CM

## 2017-09-06 DIAGNOSIS — R531 Weakness: Secondary | ICD-10-CM | POA: Diagnosis not present

## 2017-09-06 DIAGNOSIS — R4182 Altered mental status, unspecified: Secondary | ICD-10-CM

## 2017-09-06 DIAGNOSIS — B957 Other staphylococcus as the cause of diseases classified elsewhere: Secondary | ICD-10-CM | POA: Diagnosis not present

## 2017-09-06 DIAGNOSIS — E785 Hyperlipidemia, unspecified: Secondary | ICD-10-CM

## 2017-09-06 DIAGNOSIS — R482 Apraxia: Secondary | ICD-10-CM

## 2017-09-06 DIAGNOSIS — I10 Essential (primary) hypertension: Secondary | ICD-10-CM

## 2017-09-06 LAB — COMPREHENSIVE METABOLIC PANEL
ALT: 61 U/L (ref 17–63)
ANION GAP: 10 (ref 5–15)
AST: 35 U/L (ref 15–41)
Albumin: 3.5 g/dL (ref 3.5–5.0)
Alkaline Phosphatase: 69 U/L (ref 38–126)
BUN: 13 mg/dL (ref 6–20)
CHLORIDE: 100 mmol/L — AB (ref 101–111)
CO2: 25 mmol/L (ref 22–32)
Calcium: 9 mg/dL (ref 8.9–10.3)
Creatinine, Ser: 0.86 mg/dL (ref 0.61–1.24)
GFR calc Af Amer: >60 mL/min (ref >60)
GFR calc non Af Amer: >60 mL/min (ref >60)
GLUCOSE: 112 mg/dL — AB (ref 65–99)
POTASSIUM: 4 mmol/L (ref 3.5–5.1)
SODIUM: 135 mmol/L (ref 135–145)
Total Bilirubin: 0.9 mg/dL (ref 0.3–1.2)
Total Protein: 6.7 g/dL (ref 6.5–8.1)

## 2017-09-06 LAB — GLUCOSE, CAPILLARY: Glucose-Capillary: 126 mg/dL — ABNORMAL HIGH (ref 65–99)

## 2017-09-06 LAB — CBC
HEMATOCRIT: 38.1 % — AB (ref 39.0–52.0)
Hemoglobin: 13 g/dL (ref 13.0–17.0)
MCH: 31.2 pg (ref 26.0–34.0)
MCHC: 34.1 g/dL (ref 30.0–36.0)
MCV: 91.4 fL (ref 78.0–100.0)
Platelets: 249 10*3/uL (ref 150–400)
RBC: 4.17 MIL/uL — ABNORMAL LOW (ref 4.22–5.81)
RDW: 13.4 % (ref 11.5–15.5)
WBC: 7.4 10*3/uL (ref 4.0–10.5)

## 2017-09-06 NOTE — ED Notes (Signed)
Pt's son Lennette Bihari updated on pt's bed assignment.

## 2017-09-06 NOTE — Evaluation (Signed)
Physical Therapy Evaluation Patient Details Name: Martin Edwards MRN: [id number] DOB: September 01, 1945 Today's Date: 09/06/2017   History of Present Illness  Pt. is a 72 y.o. 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 He was discharged on 08/31/2016. Pt re-admitted with new L sided weakness, slurred speech, difficulty chewing, drooling and difficulty with ambulation. Patient states that on Friday he began noticing his L arm felt "dead," and yesterday he recalls looking at a cup of coffee and having an acute onset of confusion where he did not know what to do with the coffee.    Clinical Impression  Pt admitted with above diagnosis. Patient deficits including decreased L arm sensation/proprioception and L sided weakness seem to be improving; however, patient still reporting diminished sensation in left fingertips and does have decreased strength in LUE/LLE compared to the right side. Patient displays no cognitive impairments upon PT evaluation. Other deficits include slightly impaired balance; patient is currently performing all functional mobility at a supervision level for safety.  Pt will benefit from skilled PT to increase their independence and safety with mobility to allow discharge to the venue listed below.      Follow Up Recommendations Outpatient PT    Equipment Recommendations  None recommended by PT    Recommendations for Other Services       Precautions / Restrictions Precautions Precautions: Fall Restrictions Weight Bearing Restrictions: No      Mobility  Bed Mobility Overal bed mobility: Modified Independent             General bed mobility comments: Modified independent with supine to sit  Transfers Overall transfer level: Needs assistance   Transfers: Sit to/from Stand Sit to Stand: Supervision         General  transfer comment: On first sit to stand, patient requiring increased time and demonstrated posterior lean but able to correct without additional assistance   Ambulation/Gait Ambulation/Gait assistance: Supervision Ambulation Distance (Feet): 420 Feet Assistive device: None Gait Pattern/deviations: Decreased dorsiflexion - left;Decreased stride length;Step-through pattern   Gait velocity interpretation: Below normal speed for age/gender General Gait Details: Patient with supervision for safety. Decreased reciprocal arm swing and pelvic rotation.   Stairs Stairs: Yes Stairs assistance: Supervision Stair Management: One rail Right Number of Stairs: 5 General stair comments: Step over step with ascending and step by step with descending  Wheelchair Mobility    Modified Rankin (Stroke Patients Only)       Balance Overall balance assessment: Needs assistance Sitting-balance support: No upper extremity supported;Feet unsupported Sitting balance-Leahy Scale: Normal Sitting balance - Comments: Donned and doffed socks independently    Standing balance support: No upper extremity supported;During functional activity Standing balance-Leahy Scale: Good Standing balance comment: Supervision required for dynamic balance tasks                 Standardized Balance Assessment Standardized Balance Assessment : Dynamic Gait Index   Dynamic Gait Index Level Surface: Mild Impairment Change in Gait Speed: Mild Impairment Gait with Horizontal Head Turns: Mild Impairment Gait with Vertical Head Turns: Mild Impairment Gait and Pivot Turn: Mild Impairment Step Over Obstacle: Moderate Impairment Step Around Obstacles: Moderate Impairment Steps: Moderate Impairment Total Score: 13       Pertinent Vitals/Pain Pain Assessment: No/denies pain    Home Living Family/patient expects to be discharged to:: Private residence Living Arrangements: Children Available  Help at Discharge:  Family Type of Home: House Home Access: Stairs to enter Entrance Stairs-Rails: Right Entrance Stairs-Number of Steps: 3   Home Equipment: None      Prior Function Level of Independence: Independent               Hand Dominance        Extremity/Trunk Assessment   Upper Extremity Assessment Upper Extremity Assessment: RUE deficits/detail;LUE deficits/detail RUE Deficits / Details: WFL LUE Deficits / Details: MMT: Shoulder flexion 4+/5, elbow flexion 5/5, elbow extension 4/5. Decreased sensation to light touch in fingertips.  LUE Sensation: decreased light touch    Lower Extremity Assessment Lower Extremity Assessment: RLE deficits/detail;LLE deficits/detail RLE Deficits / Details: WFL LLE Deficits / Details: MMT: Hip flexion 5/5, knee extension 4/5, ankle dorsiflexion 5/5    Cervical / Trunk Assessment Cervical / Trunk Assessment: Kyphotic;Other exceptions Cervical / Trunk Exceptions: Upper thoracic kyphosis and forward head posture  Communication   Communication: No difficulties  Cognition Arousal/Alertness: Awake/alert Behavior During Therapy: WFL for tasks assessed/performed Overall Cognitive Status: Within Functional Limits for tasks assessed                                 General Comments: Patient occasionally slow to respond but overall demonstrates no apparent deficits with motor planning or following multi step commands.      General Comments General comments (skin integrity, edema, etc.): Able to open/close toothpaste with increased time and effort for the left hand.     Exercises     Assessment/Plan    PT Assessment Patient needs continued PT services  PT Problem List Decreased strength;Decreased range of motion;Decreased balance;Decreased mobility;Impaired sensation       PT Treatment Interventions Gait training;Stair training;Functional mobility training;Therapeutic activities;Therapeutic exercise;Balance training;Patient/family  education    PT Goals (Current goals can be found in the Care Plan section)  Acute Rehab PT Goals Patient Stated Goal: Increase L sided strength PT Goal Formulation: With patient Time For Goal Achievement: 09/11/17 Potential to Achieve Goals: Good    Frequency Min 4X/week   Barriers to discharge        Co-evaluation               AM-PAC PT "6 Clicks" Daily Activity  Outcome Measure Difficulty turning over in bed (including adjusting bedclothes, sheets and blankets)?: None Difficulty moving from lying on back to sitting on the side of the bed? : None Difficulty sitting down on and standing up from a chair with arms (e.g., wheelchair, bedside commode, etc,.)?: A Little Help needed moving to and from a bed to chair (including a wheelchair)?: A Little Help needed walking in hospital room?: A Little Help needed climbing 3-5 steps with a railing? : A Little 6 Click Score: 20    End of Session Equipment Utilized During Treatment: Gait belt Activity Tolerance: Patient tolerated treatment well Patient left: in chair;with call bell/phone within reach;with chair alarm set Nurse Communication: Mobility status PT Visit Diagnosis: Unsteadiness on feet (R26.81);Muscle weakness (generalized) (M62.81)    Time: 6160-7371 PT Time Calculation (min) (ACUTE ONLY): 31 min   Charges:   PT Evaluation $PT Eval Moderate Complexity: 1 Mod PT Treatments $Gait Training: 8-22 mins   PT G Codes:        Ellamae Sia, PT, DPT Acute Rehabilitation Services  Pager: [phone]   Willy Eddy 09/06/2017, 10:20 AM

## 2017-09-06 NOTE — Progress Notes (Signed)
EEG complete - results pending 

## 2017-09-06 NOTE — Progress Notes (Signed)
Subjective/Chief Complaint: Patient seen with Dr. Marla Roe today for post operative follow up.  Incision intact with minimal crusting over the area. No signs of infection or fluid collection.  Patient reports he is feeling much stronger in the left arm and hand. CT/MRI without evidence of CVA. EEG pending to rule out seizure activity.   Objective: Vital signs in last 24 hours: Temp:  [98.1 F (36.7 C)-99.8 F (37.7 C)] 98.1 F (36.7 C) (03/26 1207) Pulse Rate:  [64-110] 109 (03/26 1207) Resp:  [10-20] 20 (03/26 1207) BP: (96-170)/(52-92) 132/78 (03/26 1207) SpO2:  [95 %-100 %] 98 % (03/26 1207) Weight:  [74.1 kg (163 lb 5.8 oz)-76.2 kg (168 lb)] 74.1 kg (163 lb 5.8 oz) (03/26 0600)    Intake/Output from previous day: 03/25 0701 - 03/26 0700 In: -  Out: 650 [Urine:650] Intake/Output this shift: Total I/O In: 240 [P.O.:240] Out: -   General appearance: alert, cooperative and no distress Incision/Wound: incision is dry and intact. No fluid collections or signs of infection.   Lab Results:  Recent Labs    09/05/17 1022 09/06/17 0248  WBC 9.4 7.4  HGB 14.4 13.0  HCT 43.1 38.1*  PLT 249 249   BMET Recent Labs    09/05/17 1022 09/06/17 0248  NA 135 135  K 4.1 4.0  CL 98* 100*  CO2 23 25  GLUCOSE 126* 112*  BUN 17 13  CREATININE 0.95 0.86  CALCIUM 9.5 9.0   PT/INR Recent Labs    09/05/17 1022  LABPROT 12.3  INR 0.92   ABG No results for input(s): PHART, HCO3 in the last 72 hours.  Invalid input(s): PCO2, PO2  Studies/Results: Ct Head Wo Contrast  Result Date: 09/05/2017 CLINICAL DATA:  Left arm weakness. Recent craniectomy on 08/29/2017 for osteomyelitis of the skull. EXAM: CT HEAD WITHOUT CONTRAST TECHNIQUE: Contiguous axial images were obtained from the base of the skull through the vertex without intravenous contrast. COMPARISON:  08/25/2017 head CT and MRI FINDINGS: Brain: Sequelae of interval craniectomy are identified with resection of a  portion of the right frontoparietal bone flap. A new thin subdural hematoma over the right cerebral convexity measures 3 mm in thickness in the parietal region without associated mass effect. Small regions of encephalomalacia are again noted in the right parietal and right frontal lobes. There is no evidence of acute large territory infarct, brain edema, or midline shift. The ventricles are normal in size scratched of there is mild cerebral atrophy. Vascular: Calcified atherosclerosis at the skull base. No hyperdense vessel. Skull: Postsurgical changes from prior craniectomy. Gas and mild swelling in the overlying scalp soft tissues. Sinuses/Orbits: Unremarkable orbits. Mild mucosal thickening in the ethmoid air cells bilaterally. Small volume secretions in the sphenoid sinuses. Clear mastoid air cells. Other: None. IMPRESSION: 1. Interval right-sided craniectomy with trace subdural hematoma over the right cerebral convexity. No mass effect. 2. 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  No evidence of acute infarct. Electronically Signed   By: Logan Bores M.D.   On: 09/05/2017 14:30   Mr [name] OV Contrast  Result Date: 09/05/2017 CLINICAL DATA:  72 y/o M; recent cranial surgery for infected bone flap. Presenting with increased difficulty using left arm and increased confusion. EXAM: MRI HEAD WITHOUT AND WITH CONTRAST TECHNIQUE: Multiplanar, multiecho pulse sequences of the brain and surrounding structures were obtained without and with intravenous contrast. CONTRAST:  57mL MULTIHANCE GADOBENATE DIMEGLUMINE 529 MG/ML IV SOLN COMPARISON:  08/25/2017 MRI of the head.  09/05/2017 CT of the head.  FINDINGS: Brain: Stable small focus of encephalomalacia in the right frontal cortex and additional larger focus of encephalomalacia in the right lateral parietal lobe are stable from prior MRI. Several additional scattered T2 FLAIR hyperintense foci in the supratentorial white matter compatible with chronic microvascular  ischemic changes are stable. Stable brain parenchymal volume loss. No diffusion to suggest acute or early subacute infarction. 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 Punctate focus of susceptibility hypointensity within the right parietal white matter compatible with hemosiderin deposition of chronic microhemorrhage. No focal brain parenchymal mass effect, midline shift, effacement of basilar cisterns, or herniation. Diffuse smooth pachymeningeal enhancement over the right cerebral convexity is increased in comparison with the prior MRI of the brain. There is mild mass effect on the underlying right cerebral hemisphere with sulcal effacement without midline shift. Vascular: Normal flow voids. Skull and upper cervical spine: Interval partial right temporal craniectomy with a small air and fluid-filled collection within the craniectomy bed as well as edema in the overlying scalp, likely postsurgical. A scalp fluid collection is present just above the right ear measuring 31 x 14 x 32 mm (AP x ML x CC series 12, image 16 and series 11, image 18). Sinuses/Orbits: Mild diffuse paranasal sinus mucosal thickening greatest in the ethmoid air cells. No abnormal signal of mastoid air cells. Orbits are unremarkable. Other: None. IMPRESSION: 1. No acute process of the brain or abnormal enhancement identified. 2. Interval right temporal partial craniectomy from prior MRI. Edema and small fluid collection in the overlying scalp is likely postoperative. 3. Increased smooth pachymeningeal enhancement over the right cerebral convexity with mild mass effect on underlying brain is likely postoperative. Meningitis is possible given history of infected bone flap, but considered less likely. 4. 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 5.  Stable chronic microvascular ischemic changes and parenchymal volume loss of the brain. Electronically Signed   By: Kristine Garbe M.D.   On: 09/05/2017 20:39    Anti-infectives: Anti-infectives (From admission, onward)   Start     Dose/Rate Route Frequency Ordered Stop   09/05/17 2130  vancomycin (VANCOCIN) 1,250 mg in sodium chloride 0.9 % 250 mL IVPB     1,250 mg 166.7 mL/hr over 90 Minutes Intravenous Every 12 hours 09/05/17 2107     09/05/17 1815  vancomycin IVPB  Status:  Discontinued     1,250 mg Intravenous Every 12 hours 09/05/17 1812 09/05/17 2016   09/05/17 1815  cefTRIAXone (ROCEPHIN) IVPB  Status:  Discontinued     2 g Intravenous Every 24 hours 09/05/17 1812 09/05/17 2015      Assessment/Plan: s/p * No surgery found * S/P rotational flap for closure of scalp defect- POD # 8- Okay to gently clean the area with 1/2 strength hydrogen peroxide. Will not remove sutures at this time.  Patient should follow up next week in the office .   LOS: 0 days    Martin Lata,PA-C Plastic Surgery [phone]

## 2017-09-06 NOTE — Procedures (Signed)
EEG Report  Clinical History:  Left sided weakness.  MRI negative for acute changes.  Recent history of SDH with craniotomy/osteo.  Technical Summary:  A 19 channel digital EEG recording was performed using the 10-20 international system of electrode placement.  Bipolar and Referential montages were used.  The total recording time was approx 20 minutes.  Findings:  There is a posterior dominant rhythm of 9-10 Hz reactive to eye opening and closure.  No focal slowing is present.  Photic stimulation produces a symmetrical driving response, but does not elicit any abnormalities.  Hyperventilation is not performed .  Sleep is not recorded.  There are no epileptiform discharges or electrographic seizures present.  Impression:  This is a normal EEG in the awake state.  However, this does not rule out seizures.  If clinical suspicion remains high , then a more prolonged EEG and/or sleep deprived EEG may be of additional value.  Rogue Jury, MS, MD

## 2017-09-06 NOTE — Progress Notes (Addendum)
PT Cancellation Note  Patient Details Name: Martin Edwards MRN: [id number] DOB: 11/08/1945   Cancelled Treatment:    Reason Eval/Treat Not Completed: Active bedrest order Will await increase in activity orders prior to PT evaluation. Will follow.   Marguarite Arbour A Relda Agosto 09/06/2017, 8:07 AM Wray Kearns, PT, DPT [phone]

## 2017-09-06 NOTE — Consult Note (Signed)
Reason for Consult:  Neurologic dysfunction Referring Physician:  Dr. Lenice Pressman  Martin Edwards is an 72 y.o.right-handed white male.  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 Dr. Lyndee Leo Dillingham assisted with debridement and wound closure with a scalp advancement flap. Dr. Michel Bickers from infectious disease was consulted regarding management of osteomyelitis. Patient was given Ancef preoperatively, and was then changed by Dr. Megan Salon to vancomycin IV and Rocephin. Arrangements were made for home antibiotic therapy, and prior to discharge Dr. Megan Salon changed the treatment to vancomycin IV alone.  Patient was staying with his daughter for initial post discharge care, and had some subtle neurologic difficulty with the use of his left upper extremity and confusion. He had transient difficulty understanding how to use a coffee cup and what it was for. Those difficulties has since cleared. Patient was brought to the [facility] emergency room and evaluated by Dr. Davonna Belling (EDP).  CT of the brain was obtained and showed postsurgical changes with thickening of the dura at the site of surgery versus trace subdural hematoma without any mass effect. The CT did show chronic posttraumatic changes related to his subdural hematoma and 3 surgeries done in 2016 by Dr. Joya Salm. I discussed the case with Dr. Alvino Chapel, and felt that there was no role for surgical intervention, but rather admission for further workup and supportive care.  I did raise the possibilities of cerebrovascular incident or subclinical seizures.  Neurology was consulted, but declined to see the patient.  Patient was admitted to the [facility] health internal medicine residency inpatient service.  Vancomycin IV was continued per pharmacy consultation.  Subsequent MRI of the brain without and with gadolinium shows similar postsurgical changes to  the findings of the CT scan, but no acute changes or findings suggestive of CVA.  Past Medical History:  Past Medical History:  Diagnosis Date  . Basal cell carcinoma of face X 2  . GERD (gastroesophageal reflux disease)   . History of kidney stones   . Hyperlipidemia   . Hypertension   . Prostate cancer ([place]) 1999  . Seizures ([place]) 11/2014   brain trauma -hematoma  . Small bowel obstruction due to adhesions ([place]) 01/30/2014  . Subdural hematoma [facility])     Past Surgical History:  Past Surgical History:  Procedure Laterality Date  . APPLICATION OF A-CELL OF HEAD/NECK N/A 08/29/2017   Procedure: APPLICATION OF A-CELL;  Surgeon: Wallace Going, DO;  Location: [place];  Service: Plastics;  Laterality: N/A;  . BASAL CELL CARCINOMA EXCISION  X 2   "off my face both times"  . BRAIN SURGERY    . COLECTOMY  10/2010   "took out 8 inches of colon; wasn't cancer"  . CRANIOTOMY Right 11/11/2014   Procedure: CRANIOTOMY HEMATOMA EVACUATION SUBDURAL;  Surgeon: Leeroy Cha, MD;  Location: [place] NEURO ORS;  Service: Neurosurgery;  Laterality: Right;  . CRANIOTOMY Right 11/13/2014   Procedure: Removal bone flap, insertion into the abdominal wall;  Surgeon: Leeroy Cha, MD;  Location: [place] NEURO ORS;  Service: Neurosurgery;  Laterality: Right;  . CRANIOTOMY N/A 03/07/2015   Procedure: Cranioplasty using Bone flap in abdomen;  Surgeon: Leeroy Cha, MD;  Location: [place] NEURO ORS;  Service: Neurosurgery;  Laterality: N/A;  Cranioplasty using Bone flap in abdomen  . CRANIOTOMY N/A 08/29/2017   Procedure: CRANIECTOMY AND DEBRIDEMENT;  Surgeon: Jovita Gamma, MD;  Location: [place];  Service: Neurosurgery;  Laterality:  N/A;  CRANIECTOMY AND DEBRIDEMENT  . EXCISION MASS NECK Right 04/14/2016   Procedure: EXCISION BIOPSY OF RIGHT NECK LYMPH NODE;  Surgeon: Jerrell Belfast, MD;  Location: [place];  Service: ENT;  Laterality: Right;  . EXCISION MELANOMA WITH SENTINEL LYMPH NODE BIOPSY Right 04/14/2016   Procedure:  WIDE EXCISION MELANOMA RIGHT SHOULDER WITH SENTINEL  NODE MAPPING;  Surgeon: Autumn Messing III, MD;  Location: [place];  Service: General;  Laterality: Right;  . PROSTATECTOMY  05/20/1998  . SCALP LACERATION REPAIR N/A 08/29/2017   Procedure: SCALP FLAP TO COVER DEFECT;  Surgeon: Wallace Going, DO;  Location: [place];  Service: Plastics;  Laterality: N/A;    Family History:  Family History  Problem Relation Age of Onset  . Cancer Father        Lung    Social History:  reports that he has quit smoking. He has never used smokeless tobacco. He reports that he does not drink alcohol or use drugs.  Allergies:  Allergies  Allergen Reactions  . Other Rash    Needs Hypoallergenic Sheets/ Laundry Cart had questionable allergic reaction to detergent last time he was here.    Medications: I have reviewed the patient's current medications.  ROS:  Notable for those difficulties described in his history of present illness and past medical history, but is otherwise unremarkable.  Physical Examination: Well-developed well-nourished white male in no acute distress. Blood pressure 132/69, pulse 86, temperature 99.4 F (37.4 C), temperature source Oral, resp. rate 20, height '5\' 10"'$  (1.778 m), weight 74.1 kg (163 lb 5.8 oz), SpO2 96 %. Lungs:  Clear to auscultation, symmetrical respiratory excursion. Heart:  Regular rate and rhythm, no murmur. Abdomen:  Soft, nondistended, bowel sounds present. Extremity:  No clubbing, cyanosis, or edema. External:  Cranial incision healing nicely. No erythema, swelling, or drainage.  Flap pulsating nicely.  Mild residual swelling above right eye.  Neurological Examination: Mental Status Examination:  Awake and alert, oriented to name, [facility] hospital, and March 2019. Following commands. Speech fluent.  Cranial Nerve Examination:  Pupils equal, round, reactive to light.  EOMI. Facial sensation intact. Facial movements symmetrical.  Hearing present bilaterally.  Palatal movements symmetrical. Shoulder shrug symmetrical. Tongue midline. Motor Examination:  5/5 strength in the upper and lower extremities. No drift of the upper extremities.  Very subtle diminished fine motor control in the left hand as compared to the right hand.   Sensory Examination:  Intact to pinprick throughout. Reflex Examination:   Symmetrical.   Results for orders placed or performed during the hospital encounter of 09/05/17 (from the past 48 hour(s))  Comprehensive metabolic panel     Status: Abnormal   Collection Time: 09/05/17 10:22 AM  Result Value Ref Range   Sodium 135 135 - 145 mmol/L   Potassium 4.1 3.5 - 5.1 mmol/L   Chloride 98 (L) 101 - 111 mmol/L   CO2 23 22 - 32 mmol/L   Glucose, Bld 126 (H) 65 - 99 mg/dL   BUN 17 6 - 20 mg/dL   Creatinine, Ser 0.95 0.61 - 1.24 mg/dL   Calcium 9.5 8.9 - 10.3 mg/dL   Total Protein 7.5 6.5 - 8.1 g/dL   Albumin 3.9 3.5 - 5.0 g/dL   AST 44 (H) 15 - 41 U/L   ALT 60 17 - 63 U/L   Alkaline Phosphatase 78 38 - 126 U/L   Total Bilirubin 1.1 0.3 - 1.2 mg/dL   GFR calc non Af Amer >60 >60 mL/min  GFR calc Af Amer >60 >60 mL/min    Comment: (NOTE) The eGFR has been calculated using the CKD EPI equation. This calculation has not been validated in all clinical situations. eGFR's persistently <60 mL/min signify possible Chronic Kidney Disease.    Anion gap 14 5 - 15    Comment: Performed at [facility] [address] [place], [place] [address]  CBC with Differential     Status: None   Collection Time: 09/05/17 10:22 AM  Result Value Ref Range   WBC 9.4 4.0 - 10.5 K/uL   RBC 4.65 4.22 - 5.81 MIL/uL   Hemoglobin 14.4 13.0 - 17.0 g/dL   HCT 43.1 39.0 - 52.0 %   MCV 92.7 78.0 - 100.0 fL   MCH 31.0 26.0 - 34.0 pg   MCHC 33.4 30.0 - 36.0 g/dL   RDW 13.6 11.5 - 15.5 %   Platelets 249 150 - 400 K/uL   Neutrophils Relative % 77 %   Neutro Abs 7.1 1.7 - 7.7 K/uL   Lymphocytes Relative 17 %   Lymphs Abs 1.6 0.7 - 4.0 K/uL    Monocytes Relative 6 %   Monocytes Absolute 0.6 0.1 - 1.0 K/uL   Eosinophils Relative 0 %   Eosinophils Absolute 0.0 0.0 - 0.7 K/uL   Basophils Relative 0 %   Basophils Absolute 0.0 0.0 - 0.1 K/uL    Comment: Performed at [facility] [address] [place], [place] [address]  Protime-INR     Status: None   Collection Time: 09/05/17 10:22 AM  Result Value Ref Range   Prothrombin Time 12.3 11.4 - 15.2 seconds   INR 0.92     Comment: Performed at [facility] [address] [place], [place] [address]  I-Stat CG4 Lactic Acid, ED     Status: Abnormal   Collection Time: 09/05/17 10:31 AM  Result Value Ref Range   Lactic Acid, Venous 2.04 (HH) 0.5 - 1.9 mmol/L   Comment NOTIFIED PHYSICIAN   I-Stat CG4 Lactic Acid, ED     Status: None   Collection Time: 09/05/17  1:32 PM  Result Value Ref Range   Lactic Acid, Venous 1.24 0.5 - 1.9 mmol/L  Comprehensive metabolic panel     Status: Abnormal   Collection Time: 09/06/17  2:48 AM  Result Value Ref Range   Sodium 135 135 - 145 mmol/L   Potassium 4.0 3.5 - 5.1 mmol/L   Chloride 100 (L) 101 - 111 mmol/L   CO2 25 22 - 32 mmol/L   Glucose, Bld 112 (H) 65 - 99 mg/dL   BUN 13 6 - 20 mg/dL   Creatinine, Ser 0.86 0.61 - 1.24 mg/dL   Calcium 9.0 8.9 - 10.3 mg/dL   Total Protein 6.7 6.5 - 8.1 g/dL   Albumin 3.5 3.5 - 5.0 g/dL   AST 35 15 - 41 U/L   ALT 61 17 - 63 U/L   Alkaline Phosphatase 69 38 - 126 U/L   Total Bilirubin 0.9 0.3 - 1.2 mg/dL   GFR calc non Af Amer >60 >60 mL/min   GFR calc Af Amer >60 >60 mL/min    Comment: (NOTE) The eGFR has been calculated using the CKD EPI equation. This calculation has not been validated in all clinical situations. eGFR's persistently <60 mL/min signify possible Chronic Kidney Disease.    Anion gap 10 5 - 15    Comment: Performed at [facility] [address]  [place], [place] [address]  CBC     Status: Abnormal   Collection Time: 09/06/17  2:48 AM  Result Value Ref Range    WBC 7.4 4.0 - 10.5 K/uL   RBC 4.17 (L) 4.22 - 5.81 MIL/uL   Hemoglobin 13.0 13.0 - 17.0 g/dL   HCT 38.1 (L) 39.0 - 52.0 %   MCV 91.4 78.0 - 100.0 fL   MCH 31.2 26.0 - 34.0 pg   MCHC 34.1 30.0 - 36.0 g/dL   RDW 13.4 11.5 - 15.5 %   Platelets 249 150 - 400 K/uL    Comment: Performed at [facility], [place] [address] [place],  [address]  Glucose, capillary     Status: Abnormal   Collection Time: 09/06/17  8:10 AM  Result Value Ref Range   Glucose-Capillary 126 (H) 65 - 99 mg/dL   Comment 1 Notify RN    Comment 2 Document in Chart     Ct Head Wo Contrast  Result Date: 09/05/2017 CLINICAL DATA:  Left arm weakness. Recent craniectomy on 08/29/2017 for osteomyelitis of the skull. EXAM: CT HEAD WITHOUT CONTRAST TECHNIQUE: Contiguous axial images were obtained from the base of the skull through the vertex without intravenous contrast. COMPARISON:  08/25/2017 head CT and MRI FINDINGS: Brain: Sequelae of interval craniectomy are identified with resection of a portion of the right frontoparietal bone flap. A new thin subdural hematoma over the right cerebral convexity measures 3 mm in thickness in the parietal region without associated mass effect. Small regions of encephalomalacia are again noted in the right parietal and right frontal lobes. There is no evidence of acute large territory infarct, brain edema, or midline shift. The ventricles are normal in size scratched of there is mild cerebral atrophy. Vascular: Calcified atherosclerosis at the skull base. No hyperdense vessel. Skull: Postsurgical changes from prior craniectomy. Gas and mild swelling in the overlying scalp soft tissues. Sinuses/Orbits: Unremarkable orbits. Mild mucosal thickening in the ethmoid air cells bilaterally. Small volume secretions in the sphenoid sinuses. Clear mastoid air cells. Other: None. IMPRESSION: 1. Interval right-sided craniectomy with trace subdural hematoma over the right cerebral convexity. No mass  effect. 2. 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  No evidence of acute infarct. Electronically Signed   By: Logan Bores M.D.   On: 09/05/2017 14:30   Mr [name] [place] Contrast  Result Date: 09/05/2017 CLINICAL DATA:  72 y/o M; recent cranial surgery for infected bone flap. Presenting with increased difficulty using left arm and increased confusion. EXAM: MRI HEAD WITHOUT AND WITH CONTRAST TECHNIQUE: Multiplanar, multiecho pulse sequences of the brain and surrounding structures were obtained without and with intravenous contrast. CONTRAST:  69m MULTIHANCE GADOBENATE DIMEGLUMINE 529 MG/ML IV SOLN COMPARISON:  08/25/2017 MRI of the head.  09/05/2017 CT of the head. FINDINGS: Brain: Stable small focus of encephalomalacia in the right frontal cortex and additional larger focus of encephalomalacia in the right lateral parietal lobe are stable from prior MRI. Several additional scattered T2 FLAIR hyperintense foci in the supratentorial white matter compatible with chronic microvascular ischemic changes are stable. Stable brain parenchymal volume loss. No diffusion to suggest acute or early subacute infarction. 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 Punctate focus of susceptibility hypointensity within the right parietal white matter compatible with hemosiderin deposition of chronic microhemorrhage. No focal brain parenchymal mass effect, midline shift, effacement of basilar cisterns, or herniation. Diffuse smooth pachymeningeal enhancement over the right cerebral convexity is  increased in comparison with the prior MRI of the brain. There is mild mass effect on the underlying right cerebral hemisphere with sulcal effacement without midline shift. Vascular: Normal flow voids. Skull and upper cervical spine: Interval partial right temporal craniectomy with a small air and fluid-filled collection within the craniectomy  bed as well as edema in the overlying scalp, likely postsurgical. A scalp fluid collection is present just above the right ear measuring 31 x 14 x 32 mm (AP x ML x CC series 12, image 16 and series 11, image 18). Sinuses/Orbits: Mild diffuse paranasal sinus mucosal thickening greatest in the ethmoid air cells. No abnormal signal of mastoid air cells. Orbits are unremarkable. Other: None. IMPRESSION: 1. No acute process of the brain or abnormal enhancement identified. 2. Interval right temporal partial craniectomy from prior MRI. Edema and small fluid collection in the overlying scalp is likely postoperative. 3. Increased smooth pachymeningeal enhancement over the right cerebral convexity with mild mass effect on underlying brain is likely postoperative. Meningitis is possible given history of infected bone flap, but considered less likely. 4. 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 5. Stable chronic microvascular ischemic changes and parenchymal volume loss of the brain. Electronically Signed   By: Kristine Garbe M.D.   On: 09/05/2017 20:39     Assessment/Plan: Patient 8 days status post craniectomy and debridement of osteomyelitis of the skull, with plastic/reconstructive surgery scalp advancement flap by Dr. Audelia Hives.  Discharged 4 days ago on home vancomycin IV antibiotic therapy.  Returned yesterday with subtle neurologic dysfunction. CT and MRI show typical postsurgical changes without evidence of underlying acute brain changes and no evidence of cerebrovascular incident.  Symptomatically the patient is doing well at this time, symptoms have resolved, and he is back to his baseline.  The question of subclinical seizure activity remains, Dr. Alvino Chapel (EDP) did discuss the case with neurology.  No indication for neurosurgical intervention at this time. The patient was scheduled to see Dr.  Marla Roe in her office yesterday for suture removal and wound management. She will need to be notified of the patient's admission so that she will be able to see the patient at her convenience.  Patient is scheduled for follow-up with me in 2-1/2 weeks and is to be scheduled with Dr. Michel Bickers from infectious disease or follow-up in early April.  Hosie Spangle, MD 09/06/2017, 8:44 AM

## 2017-09-06 NOTE — Care Management Note (Addendum)
Case Management Note  Patient Details  Name: Martin Edwards MRN: [id number] Date of Birth: [birth year]  Subjective/Objective:    Pt in with altered mental status. He is from home with family. Pt was active with [facility] for IV antibiotics at home.                Action/Plan: PT recommending outpatient therapy. CM following. Pt may have to have [facility] services d/t already receiving [name] and will need to continue IV antibiotics at home. CM will speak to patients daughter and figure out best options for the patient.   Addendum: CM spoke with [name] and she is interested in outpatient therapy for her father. She states he has been to outpatient therapy at the [place] area before and they had an excellent experience. CM will follow up with outpatient PT/OT if MD's in agreement. CM following.  Expected Discharge Date:                  Expected Discharge Plan:  [place]  In-House Referral:     Discharge planning Services  CM Consult  Post Acute Care Choice:    Choice offered to:     DME Arranged:    DME Agency:     HH Arranged:  RN [place] Agency:     Status of Service:  In process, will continue to follow  If discussed at Long Length of Stay Meetings, dates discussed:    Additional Comments:  Pollie Friar, RN 09/06/2017, 3:24 PM

## 2017-09-06 NOTE — Progress Notes (Signed)
  Date: 09/06/2017  Patient name: Martin Edwards  Medical record number: [id number]  Date of birth: 01/16/1946   I have seen and evaluated this patient and I have discussed the plan of care with the house staff. Please see their note for complete details. I concur with their findings with the following additions/corrections:   72 yo man with recent craniectomy and debridement of skull osteomyelitis presenting with an array of . The morning of admission, he stared at his coffee cup and couldn't figure out what to do with it, then tried to drink it with a straw. He also had difficulty using his left hand and his daughter notes he seemed to have slightly slurred speech and difficulty chewing and was generally confused.   He did not have any associated fevers or chills, headache, convulsions, or incontinence.  This morning he feels back to normal and his neuro exam appears to be back to his baseline with mild right eye ptosis and mildly impaired rapid movements with his left hand, but intact strength and sensation.   CT and MRI brain revealed expected postsurgical changes. Labs are unremarkable.  This episode of transient apraxia, confusion, and slurred speech is perhaps most concerning for partial seizure vs TIA. Awaiting EEG this afternoon. Appreciate evaluation by neurosurgery, no acute intervention required, will follow up in their clinic as well as plastic surgery (joint case a little over a week ago) and Dr. Megan Salon with ID. If EEG is negative, can likely go home as is back to baseline.  Lenice Pressman, M.D., Ph.D. 09/06/2017, 9:02 PM

## 2017-09-06 NOTE — Evaluation (Signed)
Occupational Therapy Evaluation Patient Details Name: Martin Edwards MRN: [id number] DOB: Dec 22, 1945 Today's Date: 09/06/2017    History of Present Illness Pt. is a 72 y.o. 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 He was discharged on 08/31/2016. Pt re-admitted with new L sided weakness, slurred speech, difficulty chewing, drooling and difficulty with ambulation. Patient states that on Friday he began noticing his L arm felt "dead," and yesterday he recalls looking at a cup of coffee and having an acute onset of confusion where he did not know what to do with the coffee.  CT and MRI of his head was negative for acute event.  There is a question that he may have suffered a seizure - work up is underway.     Clinical Impression   Pt admitted with above. He demonstrates the below listed deficits and will benefit from continued OT to maximize safety and independence with BADLs.  Pt presents to OT with impaired higher level cognition.  He demonstrates deficits with executive functions - deductive reasoning, high level attention, and difficulty visually encoding info.  He was unable to perform path finding task - required total A to complete.  He does indicate that this may be baseline as he says "they used to do that with me and I'd get lost every time"  Referring to therapists.  Basic cognition appears to be intact.  Feel he would benefit from follow up OT at discharge.  Will follow acutely.       Follow Up Recommendations  Outpatient OT;Supervision/Assistance - 24 hour    Equipment Recommendations  None recommended by OT    Recommendations for Other Services       Precautions / Restrictions Precautions Precautions: Fall      Mobility Bed Mobility                  Transfers Overall transfer level: Needs assistance Equipment  used: None Transfers: Sit to/from Stand;Stand Pivot Transfers Sit to Stand: Supervision Stand pivot transfers: Supervision            Balance     Sitting balance-Leahy Scale: Normal     Standing balance support: No upper extremity supported;During functional activity Standing balance-Leahy Scale: Good                             ADL either performed or assessed with clinical judgement   ADL Overall ADL's : Needs assistance/impaired Eating/Feeding: Independent   Grooming: Wash/dry hands;Wash/dry face;Oral care;Supervision/safety;Standing   Upper Body Bathing: Supervision/ safety;Sitting;Standing   Lower Body Bathing: Supervison/ safety;Sit to/from stand Lower Body Bathing Details (indicate cue type and reason): simulated shower in standing with UE support  and no LOB  Upper Body Dressing : Set up;Sitting;Standing;Supervision/safety   Lower Body Dressing: Supervision/safety;Sit to/from stand   Toilet Transfer: Supervision/safety;Comfort height toilet;Ambulation;Regular Toilet   Toileting- Water quality scientist and Hygiene: Supervision/safety;Sit to/from stand       Functional mobility during ADLs: Supervision/safety       Vision Baseline Vision/History: Wears glasses Wears Glasses: Reading only Patient Visual Report: No change from baseline Vision Assessment?: No apparent visual deficits Additional Comments: able to read full page on info without difficulty      Perception Perception Perception Tested?: Yes   Praxis Praxis Praxis tested?: Within functional limits    Pertinent  Vitals/Pain Pain Assessment: No/denies pain     Hand Dominance Right   Extremity/Trunk Assessment Upper Extremity Assessment Upper Extremity Assessment: LUE deficits/detail LUE Deficits / Details: grip 4+/5.  He reports he has had weakness in Lt hand since previous surgeries, but it has gotten better. he was able to oppose 5th digit, and manipulate items in Lt hand  midly more slowly than in his Rt    Lower Extremity Assessment Lower Extremity Assessment: Defer to PT evaluation   Cervical / Trunk Assessment Cervical / Trunk Assessment: Kyphotic;Other exceptions Cervical / Trunk Exceptions: Upper thoracic kyphosis and forward head posture   Communication Communication Communication: No difficulties   Cognition Arousal/Alertness: Awake/alert Behavior During Therapy: WFL for tasks assessed/performed Overall Cognitive Status: Impaired/Different from baseline Area of Impairment: Attention(deficits with executive functioning )                   Current Attention Level: Alternating           General Comments: Pt was able to read room numbers on Lt and Rt when ambulating    General Comments  Pt is aware that he should not drive until cleared by MD and therapies     Exercises     Shoulder Instructions      Home Living Family/patient expects to be discharged to:: Private residence Living Arrangements: Children Available Help at Discharge: Family Type of Home: House Home Access: Stairs to enter Technical brewer of Steps: 3 Entrance Stairs-Rails: Right Home Layout: One level     Bathroom Shower/Tub: Teacher, early years/pre: Standard     Home Equipment: Grab bars - tub/shower          Prior Functioning/Environment Level of Independence: Independent        Comments: Pt reports he ambulates a mile/day.  He reports he was driving short distances, to familiar locations prior to the last surgery         OT Problem List: Decreased cognition      OT Treatment/Interventions: Self-care/ADL training;Therapeutic activities;Cognitive remediation/compensation;Patient/family education    OT Goals(Current goals can be found in the care plan section) Acute Rehab OT Goals Patient Stated Goal: to get back to normal  OT Goal Formulation: With patient Time For Goal Achievement: 09/20/17 Potential to Achieve Goals:  Good ADL Goals Pt Will Perform Grooming: with modified independence;standing Pt Will Perform Upper Body Bathing: with modified independence;standing Pt Will Perform Lower Body Bathing: with modified independence;sit to/from stand Pt Will Perform Upper Body Dressing: with modified independence;sitting;standing Pt Will Perform Lower Body Dressing: with modified independence;sit to/from stand Pt Will Transfer to Toilet: with modified independence;ambulating;regular height toilet;grab bars Pt Will Perform Toileting - Clothing Manipulation and hygiene: with modified independence;sit to/from stand Pt Will Perform Tub/Shower Transfer: Tub transfer;with modified independence;ambulating Additional ADL Goal #1: Pt will be able to alternate and divide attention during ADL tasks Additional ADL Goal #2: Pt will perform path finding activity with mod cues  OT Frequency: Min 2X/week   Barriers to D/C:            Co-evaluation              AM-PAC PT "6 Clicks" Daily Activity     Outcome Measure Help from another person eating meals?: None Help from another person taking care of personal grooming?: A Little Help from another person toileting, which includes using toliet, bedpan, or urinal?: A Little Help from another person bathing (including washing, rinsing, drying)?: A Little Help from  another person to put on and taking off regular upper body clothing?: A Little Help from another person to put on and taking off regular lower body clothing?: A Little 6 Click Score: 19   End of Session Nurse Communication: Mobility status  Activity Tolerance: Patient tolerated treatment well Patient left: in chair;with call bell/phone within reach;with chair alarm set;with nursing/sitter in room  OT Visit Diagnosis: Cognitive communication deficit (R37.366)                Time: 8159-4707 OT Time Calculation (min): 40 min Charges:  OT General Charges $OT Visit: 1 Visit OT Evaluation $OT Eval Moderate  Complexity: 1 Mod OT Treatments $Self Care/Home Management : 8-22 mins $Therapeutic Activity: 8-22 mins G-Codes:     [facility], OTR/L [phone]   Lucille Passy M 09/06/2017, 3:39 PM

## 2017-09-06 NOTE — Progress Notes (Signed)
Patient ID: Martin Edwards, male   DOB: January 01, 1946, 72 y.o.   MRN: [id number]          [facility]  Date of Admission:  09/05/2017           Day 8 vancomycin ASSESSMENT: I am not absolutely certain what has caused his recent, transient neurologic change but I am not terribly surprised given that he had chronic osteomyelitis and underwent recent neurosurgery.  There is no evidence of any undrained infection.  I do not think the changes were related to vancomycin.  Based on the vancomycin dosing protocol he is right on the borderline between every 12 hours dosing and daily dosing.  I will contact his daughter, Gareth Morgan, to review options and understand what she feels is practical as an outpatient.  PLAN: 1. Continue vancomycin  Principal Problem:   Altered mental status Active Problems:   Osteomyelitis of skull (HCC)   Scheduled Meds: . amLODipine  10 mg Oral Daily  . atorvastatin  40 mg Oral Daily  . hydrochlorothiazide  12.5 mg Oral Daily  . lisinopril  40 mg Oral Daily   Continuous Infusions: . 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  Bone cultures grew 2 species of coagulase-negative staph and diphtheroids.  He was discharged on IV vancomycin.  Apparently had some periods of confusion.  He tells me that yesterday morning he was given a cup of coffee by his daughter but did not know what to do with it.  He says that all of that resolved overnight and he is feeling completely back to normal now.  He denies any headache, fever, chills or sweats.  I spoke with his son, Lennette Bihari, by phone this morning.  Lennette Bihari and his sister, Gareth Morgan, were upset that his vancomycin dose was interrupted last night.  They were also confused as to why his dosing was increased to every 12 hours  from once daily.  Review of Systems: Review of Systems  Constitutional: Negative for chills, fever and malaise/fatigue.  Gastrointestinal: Negative for abdominal pain, diarrhea, nausea and vomiting.  Neurological: Negative for dizziness and headaches.       As noted in HPI.    Allergies  Allergen Reactions  . Other Rash    Needs Hypoallergenic Sheets/ Laundry Cart had questionable allergic reaction to detergent last time he was here.    OBJECTIVE: Vitals:   09/06/17 0530 09/06/17 0600 09/06/17 0813 09/06/17 1207  BP: 133/73 136/64 132/69 132/78  Pulse: 88 90 86 (!) 109  Resp: 13 18 20 20   Temp:  99.8 F (37.7 C) 99.4 F (37.4 C) 98.1 F (36.7 C)  TempSrc:  Oral Oral Oral  SpO2: 100% 100% 96% 98%  Weight:  163 lb 5.8 oz (74.1 kg)    Height:  5\' 10"  (1.778 m)     Body mass index is 23.44 kg/m.  Physical Exam  Constitutional: He is oriented to person, place, and time.  He is sitting up in a chair.  He is alert and talkative.  HENT:  His right scalp and incision looks good.  Cardiovascular: Normal rate and regular rhythm.  No murmur heard. Pulmonary/Chest: Effort normal. He has no wheezes. He has no rales.  Neurological: He is alert and oriented to person, place, and time.  Skin: No rash noted.  Psychiatric: Mood and affect normal.  Lab Results Lab Results  Component Value Date   WBC 7.4 09/06/2017   HGB 13.0 09/06/2017   HCT 38.1 (L) 09/06/2017   MCV 91.4 09/06/2017   PLT 249 09/06/2017    Lab Results  Component Value Date   CREATININE 0.86 09/06/2017   BUN 13 09/06/2017   NA 135 09/06/2017   K 4.0 09/06/2017   CL 100 (L) 09/06/2017   CO2 25 09/06/2017    Lab Results  Component Value Date   ALT 61 09/06/2017   AST 35 09/06/2017   ALKPHOS 69 09/06/2017   BILITOT 0.9 09/06/2017     Microbiology: Recent Results (from the past 240 hour(s))  Aerobic/Anaerobic Culture (surgical/deep wound)     Status: None   Collection Time: 08/29/17  1:35 PM    Result Value Ref Range Status   Specimen Description BONE SKULL  Final   Special Requests NONE  Final   Gram Stain   Final    FEW WBC PRESENT, PREDOMINANTLY PMN MODERATE GRAM POSITIVE RODS FEW GRAM POSITIVE COCCI    Culture   Final    MODERATE STAPHYLOCOCCUS SPECIES (COAGULASE NEGATIVE) MODERATE DIPHTHEROIDS(CORYNEBACTERIUM SPECIES) Standardized susceptibility testing for this organism is not available. NO ANAEROBES ISOLATED Performed at [facility], [place] [address], [place], [place] [address]    Report Status 09/03/2017 FINAL  Final   Organism ID, Bacteria STAPHYLOCOCCUS SPECIES (COAGULASE NEGATIVE)  Final      Susceptibility   Staphylococcus species (coagulase negative) - MIC*    CIPROFLOXACIN <=0.5 SENSITIVE Sensitive     ERYTHROMYCIN 0.5 SENSITIVE Sensitive     GENTAMICIN <=0.5 SENSITIVE Sensitive     OXACILLIN <=0.25 SENSITIVE Sensitive     TETRACYCLINE >=16 RESISTANT Resistant     VANCOMYCIN 1 SENSITIVE Sensitive     TRIMETH/SULFA <=10 SENSITIVE Sensitive     CLINDAMYCIN <=0.25 SENSITIVE Sensitive     RIFAMPIN <=0.5 SENSITIVE Sensitive     Inducible Clindamycin NEGATIVE Sensitive     * MODERATE STAPHYLOCOCCUS SPECIES (COAGULASE NEGATIVE)    Michel Bickers, [place] [place] [phone] pager   [phone] cell 09/06/2017, 12:41 PM

## 2017-09-06 NOTE — Care Management Obs Status (Signed)
[facility] NOTIFICATION   Patient Details  Name: Martin Edwards MRN: [id number] Date of Birth: 1946-01-02   Medicare Observation Status Notification Given:  Yes    Pollie Friar, RN 09/06/2017, 3:45 PM

## 2017-09-06 NOTE — Progress Notes (Signed)
   Subjective:  Patient seen and examined. He feels much better this morning. He states he does not feel confused and feels back to his baseline. States he worked with physical therapy and did well, no issues with weakness.   Objective:  Vital signs in last 24 hours: Vitals:   09/06/17 0530 09/06/17 0600 09/06/17 0813 09/06/17 1207  BP: 133/73 136/64 132/69 132/78  Pulse: 88 90 86 (!) 109  Resp: 13 18 20 20   Temp:  99.8 F (37.7 C) 99.4 F (37.4 C) 98.1 F (36.7 C)  TempSrc:  Oral Oral Oral  SpO2: 100% 100% 96% 98%  Weight:  163 lb 5.8 oz (74.1 kg)    Height:  5\' 10"  (1.778 m)     General: Laying in bed comfortably, NAD HEENT: right sided scalp suture site appears clean and dry, minimal swelling, Cardiac: RRR, No R/M/G appreciated Pulm: normal effort, CTAB Abd: soft, non tender, non distended, BS normal Ext: extremities well perfused, no peripheral edema Neuro: alert and oriented X3, cranial nerves II-XII grossly intact; no focal neurological deficits; strength symmetric bilaterally in upper and lower extremities    Assessment/Plan:  Principal Problem:   Altered mental status Active Problems:   Osteomyelitis of skull (HCC)  Confusion and Left Sided Weakness  Osteomyelitis of Skull Patient recently underwent debridement of osteomyelitis of the skull on 3/18. Vital signs are stable no signs of infection at this time, patient has been adherent with abx. MRI brain consistent with post-operative changes. Patient AOx3, neurological exam without focal neurological deficits, but patient does exhibit some apraxia on the left side. Patient has returned to his baseline today with resolution of his cognitive dysfunction.  -Evaluated by Dr. Sherwood Gambler, no need for neurosurgical intervention at this time - will follow up as outpatient -Seizure remains a possible cause of the patient's symptoms  -EEG ordered - will f/u results -Continue Vancomycin  -PT/OT recommend outpatient PT  -Will  contact Dr. Marla Roe as patient was supposed to have sutures removed yesterday, but missed appointment due to hospitalization   HTN, HLD Normotensive, BP 132/78.  -Will continue amlodipine 10 mg and Lisinopril 40 mg tomorrow  -Continue Lipitor 40 mg daily     Dispo: Anticipated discharge in approximately 0-1 day(s).   Melanee Spry, MD 09/06/2017, 12:14 PM Pager: [phone]

## 2017-09-07 ENCOUNTER — Telehealth: Payer: Self-pay | Admitting: Pharmacist

## 2017-09-07 ENCOUNTER — Other Ambulatory Visit: Payer: Self-pay | Admitting: Pharmacist

## 2017-09-07 DIAGNOSIS — R4182 Altered mental status, unspecified: Secondary | ICD-10-CM | POA: Diagnosis not present

## 2017-09-07 DIAGNOSIS — I10 Essential (primary) hypertension: Secondary | ICD-10-CM | POA: Diagnosis not present

## 2017-09-07 DIAGNOSIS — Z9109 Other allergy status, other than to drugs and biological substances: Secondary | ICD-10-CM | POA: Diagnosis not present

## 2017-09-07 DIAGNOSIS — R531 Weakness: Secondary | ICD-10-CM

## 2017-09-07 DIAGNOSIS — Z91048 Other nonmedicinal substance allergy status: Secondary | ICD-10-CM

## 2017-09-07 DIAGNOSIS — R410 Disorientation, unspecified: Secondary | ICD-10-CM | POA: Diagnosis not present

## 2017-09-07 DIAGNOSIS — R404 Transient alteration of awareness: Secondary | ICD-10-CM | POA: Diagnosis not present

## 2017-09-07 LAB — BASIC METABOLIC PANEL
Anion gap: 9 (ref 5–15)
BUN: 10 mg/dL (ref 6–20)
CO2: 25 mmol/L (ref 22–32)
Calcium: 9.1 mg/dL (ref 8.9–10.3)
Chloride: 102 mmol/L (ref 101–111)
Creatinine, Ser: 0.85 mg/dL (ref 0.61–1.24)
GFR calc Af Amer: >60 mL/min (ref >60)
GFR calc non Af Amer: >60 mL/min (ref >60)
Glucose, Bld: 113 mg/dL — ABNORMAL HIGH (ref 65–99)
Potassium: 3.7 mmol/L (ref 3.5–5.1)
Sodium: 136 mmol/L (ref 135–145)

## 2017-09-07 LAB — GLUCOSE, CAPILLARY: Glucose-Capillary: 113 mg/dL — ABNORMAL HIGH (ref 65–99)

## 2017-09-07 MED ORDER — HEPARIN SOD (PORK) LOCK FLUSH 100 UNIT/ML IV SOLN
250.0000 [IU] | INTRAVENOUS | Status: AC | PRN
  Administered 2017-09-07: 250 [IU]

## 2017-09-07 NOTE — Telephone Encounter (Signed)
Spoke to Dr. Megan Salon regarding patient's vancomycin dosing. He is good with doing BID dosing and so is patient's family.  Called and spoke to [place], [facility] pharmacist, regarding patient's vancomycin dose.  He was originally on once daily dosing at home due to age and patient's family preference for once daily dosing.  Discussed with Jeani Hawking and he will do BID dosing once patient is discharged.  Will follow patient's labs closely at home, especially SCr.  Patient also on Lisinopril and HCTZ at [facility], which could further increase his risk of AKI.  Will follow closely.

## 2017-09-07 NOTE — Progress Notes (Addendum)
   Subjective:  Patient seen and examined. No acute events overnight. Denies confusion or left handed weakness, and feels back to his baseline.   Discussed normal EEG results with patient recommended follow up with Neurologist. Patient agreeable to discharge.   Objective:  Vital signs in last 24 hours: Vitals:   09/06/17 2038 09/06/17 2357 09/07/17 0402 09/07/17 0740  BP: 137/73 137/68 127/66 (!) 141/66  Pulse: (!) 101 91 87 82  Resp: 18 18 18 18   Temp: 98.9 F (37.2 C) 98.8 F (37.1 C) 98.8 F (37.1 C) 98.4 F (36.9 C)  TempSrc: Oral Oral Oral Oral  SpO2: 97% 100% 99% 100%  Weight:      Height:       General: Laying in bed comfortably, NAD HEENT: right sided scalp suture site appears clean and dry, minimal swelling Cardiac: RRR, No R/M/G appreciated Pulm: normal effort, CTAB Abd: soft, non tender, non distended, BS normal Ext: extremities well perfused, no peripheral edema Neuro: alert and oriented X3, cranial nerves II-XII grossly intact; no focal neurological deficits    Assessment/Plan:  Principal Problem:   Altered mental status Active Problems:   Osteomyelitis of skull (HCC)   Apraxia  Confusion and Left Sided Weakness  Osteomyelitis of Skull Patient recently underwent debridement of osteomyelitis of the skull on 3/18. Vital signs are stable no signs of infection at this time. Patient AOx3, neurological exam without focal neurological deficits, apraxia improved on the left side. Patient remains at his cognitive baseline today with resolution of confusion.  -EEG normal, no focal slowing present - recommend follow up with patient's prior neurologist -To follow up with Dr. Sherwood Gambler as outpatient -Continue Vancomycin - dosing per infectious disease -PT/OT - recommend outpatient PT  -Follow up with Dr. Marla Roe in 1 week for suture removal   HTN, HLD -Continue amlodipine 10 mg, HCTZ 12.5 mg daily, and Lisinopril 40 mg at discharge  -Continue Lipitor 40 mg daily  at discharge     Dispo: Anticipated discharge today.   Melanee Spry, MD 09/07/2017, 9:54 AM Pager: [phone]

## 2017-09-07 NOTE — Progress Notes (Signed)
Physical Therapy Treatment Patient Details Name: Martin Edwards MRN: [id number] DOB: [birth year] Today's Date: 09/07/2017    History of Present Illness Pt. is a 72 y.o. 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 He was discharged on 08/31/2016. Pt re-admitted with new L sided weakness, slurred speech, difficulty chewing, drooling and difficulty with ambulation. Patient states that on Friday he began noticing his L arm felt "dead," and yesterday he recalls looking at a cup of coffee and having an acute onset of confusion where he did not know what to do with the coffee.  CT and MRI of his head was negative for acute event.  There is a question that he may have suffered a seizure - work up is underway.      PT Comments    Patient with improved functional use and sensation in LUE. Modified independent for functional mobility at this time. Requiring supervision for high level balance activities. No further acute care PT needs at this time. Please re-consult if status changes.     Follow Up Recommendations  Outpatient PT     Equipment Recommendations  None recommended by PT    Recommendations for Other Services       Precautions / Restrictions Precautions Precautions: Fall Restrictions Weight Bearing Restrictions: No    Mobility  Bed Mobility Overal bed mobility: Independent                Transfers Overall transfer level: Independent Equipment used: None Transfers: Sit to/from [employer] to Stand: Independent Stand pivot transfers: Independent          Ambulation/Gait Ambulation/Gait assistance: Modified independent (Device/Increase time);Supervision Ambulation Distance (Feet): 380 Feet Assistive device: None Gait Pattern/deviations: Decreased dorsiflexion - left;Decreased stride  length;Step-through pattern   Gait velocity interpretation: Below normal speed for age/gender General Gait Details: Patient modified independent with ambulation over level surfaces with alternating speeds. Requiring supervision for dynamic gait including side stepping, backwards walking, and grapevine. Patient demonstrates slower gait speed with dual tasking with cognitive activities such as counting backwards from 100 by 3's.    Stairs            Wheelchair Mobility    Modified Rankin (Stroke Patients Only)       Balance Overall balance assessment: Needs assistance Sitting-balance support: No upper extremity supported;Feet unsupported Sitting balance-Leahy Scale: Normal     Standing balance support: No upper extremity supported;During functional activity Standing balance-Leahy Scale: Good Standing balance comment: Patient brushed teeth at sink independently with no UE support. Supervision required for dynamic gait.                             Cognition Arousal/Alertness: Awake/alert Behavior During Therapy: WFL for tasks assessed/performed Overall Cognitive Status: Impaired/Different from baseline Area of Impairment: Attention(deficits with executive functioning )                   Current Attention Level: Alternating           General Comments: Patient was able to count backwards by 3's by 100 while ambulating with increased time and minimal cueing      Exercises      General Comments General comments (skin integrity, edema, etc.): Patient has increased difficulty with motor planning for multi step activities but is able  to achieve with min verbal cueing.      Pertinent Vitals/Pain Pain Assessment: No/denies pain    Home Living                      Prior Function            PT Goals (current goals can now be found in the care plan section) Acute Rehab PT Goals Patient Stated Goal: to get back to normal  PT Goal  Formulation: With patient Time For Goal Achievement: 09/11/17 Potential to Achieve Goals: Good Progress towards PT goals: Goals met/education completed, patient discharged from PT    Frequency    Min 4X/week      PT Plan Current plan remains appropriate    Co-evaluation              AM-PAC PT "6 Clicks" Daily Activity  Outcome Measure  Difficulty turning over in bed (including adjusting bedclothes, sheets and blankets)?: None Difficulty moving from lying on back to sitting on the side of the bed? : None Difficulty sitting down on and standing up from a chair with arms (e.g., wheelchair, bedside commode, etc,.)?: None Help needed moving to and from a bed to chair (including a wheelchair)?: None Help needed walking in hospital room?: None Help needed climbing 3-5 steps with a railing? : A Little 6 Click Score: 23    End of Session Equipment Utilized During Treatment: Gait belt Activity Tolerance: Patient tolerated treatment well Patient left: in chair;with call bell/phone within reach Nurse Communication: Mobility status PT Visit Diagnosis: Unsteadiness on feet (R26.81);Muscle weakness (generalized) (M62.81)     Time: 0931-1000 PT Time Calculation (min) (ACUTE ONLY): 29 min  Charges:  $Gait Training: 8-22 mins $Therapeutic Activity: 8-22 mins                    G Codes:      Ellamae Sia, PT, DPT Acute Rehabilitation Services  Pager: [phone]    Willy Eddy 09/07/2017, 10:56 AM

## 2017-09-07 NOTE — Care Management Note (Signed)
Case Management Note  Patient Details  Name: Martin Edwards MRN: [id number] Date of Birth: 07/25/1945  Subjective/Objective:                    Action/Plan: Pt discharging home with [facility] RN for IV antibiotics. Pt was already active with [facility] for this service. Pam with [facility] IV therapy made aware of the d/c.  Pt with orders for outpatient PT/OT. Pts daughter requested [facility] outpatient therapy. CM spoke to [facility] therapy and faxed them the orders for PT/OT.  Family to provide transportation back to [place].   Expected Discharge Date:  09/07/17               Expected Discharge Plan:  [facility](outpatient PT/OT)  In-House Referral:     Discharge planning Services  CM Consult  Post Acute Care Choice:  Home Health Choice offered to:  Adult Children  DME Arranged:    DME Agency:     HH Arranged:  RN [place] Agency:  [facility]  Status of Service:  Completed, signed off  If discussed at [place] of Stay Meetings, dates discussed:    Additional Comments:  Pollie Friar, RN 09/07/2017, 12:14 PM

## 2017-09-07 NOTE — Progress Notes (Signed)
Antimicrobial Note  72 yo man with recent craniectomy and debridement of skull osteomyelitis discharged 3/21 on Vancomycin 1250 mg IV BID for OM treatment. Upon discharge, his Vancomycin dose was changed to 1250 mg IV daily by [facility] for ease of administration. Pt's dtr and son are taking care of him and they would prefer once daily if possible.   Pt was readmitted on 3/25 for episode of apraxia, left sided weakness and slurred speech. EEG was normal and confusion has resolved. Pt will likely be discharged today on Vancomycin and pharmacy has been consulted for PTA dosing recommendation. Based on PK/PD calculations and renal function, BID dosing would be best to maintain a target trough of 15-20 and minimize highly elevated peaks which would be concerning with daily dosing.   Leroy Libman, PharmD Pharmacy Resident Pager: [phone]

## 2017-09-07 NOTE — Progress Notes (Signed)
Patient ID: Martin Edwards, male   DOB: 06-15-1945, 72 y.o.   MRN: [id number]          [place] for Infectious Disease  Date of Admission:  09/05/2017           Day 9 vancomycin ASSESSMENT: His transient neurologic episode has resolved.  He is ready for discharge this afternoon.  He will be going back to [facility] to stay with his daughter.  I have reviewed vancomycin dosing options with our ID pharmacists.  They recommend continuing a every 12 hours dosing given his current renal function.  I have discussed this with his daughter and she is in agreement.  He will follow-up with me in about 4 weeks.  PLAN: 1. Continue vancomycin  Principal Problem:   Altered mental status Active Problems:   Osteomyelitis of skull (HCC)   Apraxia   Scheduled Meds: . amLODipine  10 mg Oral Daily  . atorvastatin  40 mg Oral Daily  . hydrochlorothiazide  12.5 mg Oral Daily  . lisinopril  40 mg Oral Daily   Continuous Infusions: . vancomycin 1,250 mg (09/07/17 1121)   PRN Meds:.acetaminophen **OR** acetaminophen, ondansetron, senna-docusate   SUBJECTIVE: [name] is feeling much better today.  He says that his left arm weakness continues to improve and he has had no more episodes of confusion.  He had a normal EEG.  He is eager to go home.  Review of Systems: Review of Systems  Constitutional: Negative for chills, fever and malaise/fatigue.  Gastrointestinal: Negative for abdominal pain, diarrhea, nausea and vomiting.  Neurological: Positive for focal weakness. Negative for dizziness and headaches.    Allergies  Allergen Reactions  . Other Rash    Needs Hypoallergenic Sheets/ Laundry Cart had questionable allergic reaction to detergent last time he was here.    OBJECTIVE: Vitals:   09/06/17 2357 09/07/17 0402 09/07/17 0740 09/07/17 1137  BP: 137/68 127/66 (!) 141/66 139/72  Pulse: 91 87 82 94  Resp: 18 18 18 18   Temp: 98.8 F (37.1 C) 98.8 F (37.1 C) 98.4 F (36.9 C) 98.5 F  (36.9 C)  TempSrc: Oral Oral Oral Oral  SpO2: 100% 99% 100% 99%  Weight:      Height:       Body mass index is 23.44 kg/m.  Physical Exam  Constitutional: He is oriented to person, place, and time.  He is sitting up in a chair.  He is alert and talkative.  HENT:  He has a clean dry gauze dressing over his scalp incision.  Cardiovascular: Normal rate and regular rhythm.  No murmur heard. Pulmonary/Chest: Effort normal. He has no wheezes. He has no rales.  Neurological: He is alert and oriented to person, place, and time.  Skin: No rash noted.  Psychiatric: Mood and affect normal.    Lab Results Lab Results  Component Value Date   WBC 7.4 09/06/2017   HGB 13.0 09/06/2017   HCT 38.1 (L) 09/06/2017   MCV 91.4 09/06/2017   PLT 249 09/06/2017    Lab Results  Component Value Date   CREATININE 0.85 09/07/2017   BUN 10 09/07/2017   NA 136 09/07/2017   K 3.7 09/07/2017   CL 102 09/07/2017   CO2 25 09/07/2017    Lab Results  Component Value Date   ALT 61 09/06/2017   AST 35 09/06/2017   ALKPHOS 69 09/06/2017   BILITOT 0.9 09/06/2017     Microbiology: Recent Results (from the past 240 hour(s))  Aerobic/Anaerobic  Culture (surgical/deep wound)     Status: None   Collection Time: 08/29/17  1:35 PM  Result Value Ref Range Status   Specimen Description BONE SKULL  Final   Special Requests NONE  Final   Gram Stain   Final    FEW WBC PRESENT, PREDOMINANTLY PMN MODERATE GRAM POSITIVE RODS FEW GRAM POSITIVE COCCI    Culture   Final    MODERATE STAPHYLOCOCCUS SPECIES (COAGULASE NEGATIVE) MODERATE DIPHTHEROIDS(CORYNEBACTERIUM SPECIES) Standardized susceptibility testing for this organism is not available. NO ANAEROBES ISOLATED Performed at [facility], [place] [address] [place], [place] [address]    Report Status 09/03/2017 FINAL  Final   Organism ID, Bacteria STAPHYLOCOCCUS SPECIES (COAGULASE NEGATIVE)  Final      Susceptibility   Staphylococcus species  (coagulase negative) - MIC*    CIPROFLOXACIN <=0.5 SENSITIVE Sensitive     ERYTHROMYCIN 0.5 SENSITIVE Sensitive     GENTAMICIN <=0.5 SENSITIVE Sensitive     OXACILLIN <=0.25 SENSITIVE Sensitive     TETRACYCLINE >=16 RESISTANT Resistant     VANCOMYCIN 1 SENSITIVE Sensitive     TRIMETH/SULFA <=10 SENSITIVE Sensitive     CLINDAMYCIN <=0.25 SENSITIVE Sensitive     RIFAMPIN <=0.5 SENSITIVE Sensitive     Inducible Clindamycin NEGATIVE Sensitive     * MODERATE STAPHYLOCOCCUS SPECIES (COAGULASE NEGATIVE)    Michel Bickers, MD [facility] for Infectious [facility] [phone] pager   [phone] cell 09/07/2017, 12:30 PM

## 2017-09-08 ENCOUNTER — Telehealth: Payer: Self-pay | Admitting: Pharmacist

## 2017-09-08 NOTE — Telephone Encounter (Signed)
Jeani Hawking called to tell me that patient will be serviced by [facility] branch of [facility]. Jeani Hawking will not be dosing/monitoring patient.  He did communicate with pharmacist at [facility] office regarding BID vancomycin dosing and he will do that as well.  FYI - Pharmacist is Joe and number to reach him is [phone] ext [phone].

## 2017-09-09 NOTE — Discharge Summary (Signed)
 Name: Martin Edwards MRN: [id number] DOB: 05/12/1946 72 y.o. PCP: Sagardia, Miguel Jose, MD  Date of Admission: 09/05/2017 11:54 AM Date of Discharge: 09/07/2017 Attending Physician: No att. providers found  Discharge Diagnosis: 1. Altered mental status in setting of Osteomyelitis of Skull  Principal Problem:   Altered mental status Active Problems:   Osteomyelitis of skull (HCC)   Apraxia   Discharge Medications: Allergies as of 09/07/2017      Reactions   Other Rash   Needs Hypoallergenic Sheets/ Laundry Cart had questionable allergic reaction to detergent last time he was here.      Medication List    STOP taking these medications   cefTRIAXone IVPB Commonly known as:  ROCEPHIN     TAKE these medications   acetaminophen 500 MG tablet Commonly known as:  TYLENOL Take 1,000 mg by mouth every 6 (six) hours as needed for headache (pain).   amLODipine 10 MG tablet Commonly known as:  NORVASC Take 1 tablet (10 mg total) by mouth daily.   atorvastatin 40 MG tablet Commonly known as:  LIPITOR Take 1 tablet (40 mg total) by mouth daily.   hydrochlorothiazide 12.5 MG capsule Commonly known as:  MICROZIDE Take 1 capsule (12.5 mg total) by mouth daily.   lisinopril 40 MG tablet Commonly known as:  PRINIVIL,ZESTRIL Take 1 tablet (40 mg total) by mouth daily.   ondansetron 4 MG tablet Commonly known as:  ZOFRAN Take 1 tablet (4 mg total) by mouth every 8 (eight) hours as needed for nausea or vomiting.   vancomycin IVPB Inject 1,250 mg into the vein every 12 (twelve) hours. Indication: osteomyelitis Last Day of Therapy: 10/10/17 Labs - Sunday/Monday:  CBC/D, BMP, and vancomycin trough. Labs - Thursday:  BMP and vancomycin trough Labs - Every other week:  ESR and CRP What changed:    when to take this  additional instructions       Disposition and follow-up:   Martin Edwards was discharged from [facility] in Stable condition.  At the  hospital follow up visit please address:  1.   Confusion and Left Sided Weakness, Osteomyelitis of Skull  -Patient's mild cognitive dysfunction and left sided weakness expected in setting of recent neurosurgery -Patient had complete resolution of symptoms -No acute CVA or hemorrhage noted on CT or MRI -Subclinical seizure was a concern, EEG was negative for seizure activity - please evaluate if patient has had recurrence of symptoms -Please address if patient has followed up with Neurology - this was recommend at discharge  -Neurosurgery and plastic surgery follow up -Outpatient physical therapy  -Vancomycin until 10/11/2017   2. Hypertension  -Well controlled during hospitalization -Amlodipine 10 mg -Lisinopril 40 mg  3. Hyperlipidema -Lipitor 40 mg daily   2.  Labs / imaging needed at time of follow-up: none  3.  Pending labs/ test needing follow-up: none   Follow-up Appointments: Follow-up Information    [facility] outpatient therapy Follow up.   Why:  They will contact you for the first appointment. Contact information: [address], [place], [place] [address]  [phone]          Hospital Course by problem list: Principal Problem:   Altered mental status Active Problems:   Osteomyelitis of skull (HCC)   Apraxia   1. Confusion and Left Sided Weakness, Osteomyelitis of Skull  Martin Edwards was admitted to [facility] and the Internal Medicine Teaching Service for left sided weakness and altered mental status in   the setting of recent surgical debridement of osteomyelitis of the skull 1 week prior to admission. Upon arrival, the patient's vital signs were were stable. Labs were unremarkable and he had no signs of systemic infection. Physical exam was without focal neurological deficit, but patient did exhibit mild apraxia of left hand. CT scan of the head showed interval trace subdural hematoma over the right cerebral convexity, no mass effect, and no evidence of  acute infarct. MRI of the brain was performed and showed no acute process of the brain or abnormal enhancement identified; it did show an increased smooth pachymeningeal enhancement over the right cerebral convexity with mild mass effect on underlying brain is likely postoperative. Neurosurgery was consulted to evaluate the patient. Martin Edwards, who performed the patient's surgery, evaluated the patient and determined there was no indication for neurosurgical intervention.The patient was continued on Vancomyin and dosing was managed by Infectious Disease and pharmacy. EEG was performed and did not show evidence of seizure activity. The patient had complete resolution of his symptoms. No seizure activity or worsening cognitive function was observed during hospitalization. He was evaluated by physical therapy and occupational therapy who recommended outpatient physical therapy. It was recommended that the patient follow up with his Neurologist.   2. Hypertension  Patient was resumed on his home blood pressure medications, amlodipine 10 mg and Lisinopril 40 mg, while hospitalized and at discharge.   3. Hyperlipidema Continued Lipitor  40 mg daily during hospitalization and at discharge.   Discharge Vitals:   BP 139/72 (BP Location: Left Arm)   Pulse 94   Temp 98.5 F (36.9 C) (Oral)   Resp 18   Ht 5' 10" (1.778 m)   Wt 163 lb 5.8 oz (74.1 kg)   SpO2 99%   BMI 23.44 kg/m   Pertinent Labs, Studies, and Procedures:  CBC Latest Ref Rng & Units 09/06/2017 09/05/2017 08/24/2017  WBC 4.0 - 10.5 K/uL 7.4 9.4 8.5  Hemoglobin 13.0 - 17.0 g/dL 13.0 14.4 15.2  Hematocrit 39.0 - 52.0 % 38.1(L) 43.1 44.3  Platelets 150 - 400 K/uL 249 249 255   BMP Latest Ref Rng & Units 09/07/2017 09/06/2017 09/05/2017  Glucose 65 - 99 mg/dL 113(H) 112(H) 126(H)  BUN 6 - 20 mg/dL 10 13 17  Creatinine 0.61 - 1.24 mg/dL 0.85 0.86 0.95  BUN/Creat Ratio 10 - 24 - - -  Sodium 135 - 145 mmol/L 136 135 135  Potassium 3.5 - 5.1  mmol/L 3.7 4.0 4.1  Chloride 101 - 111 mmol/L 102 100(L) 98(L)  CO2 22 - 32 mmol/L 25 25 23  Calcium 8.9 - 10.3 mg/dL 9.1 9.0 9.5  CT Head WO Contrast FINDINGS: Brain: Sequelae of interval craniectomy are identified with resection of a portion of the right frontoparietal bone flap. A new thin subdural hematoma over the right cerebral convexity measures 3 mm in thickness in the parietal region without associated mass effect. Small regions of encephalomalacia are again noted in the right parietal and right frontal lobes. There is no evidence of acute large territory infarct, brain edema, or midline shift. The ventricles are normal in size scratched of there is mild cerebral atrophy.  Vascular: Calcified atherosclerosis at the skull base. No hyperdense vessel.  Skull: Postsurgical changes from prior craniectomy. Gas and mild swelling in the overlying scalp soft tissues.  Sinuses/Orbits: Unremarkable orbits. Mild mucosal thickening in the ethmoid air cells bilaterally. Small volume secretions in the sphenoid sinuses. Clear mastoid air cells.  IMPRESSION: 1. Interval right-sided craniectomy   with trace subdural hematoma over the right cerebral convexity. No mass effect. 2. 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  No evidence of acute infarct.  MRI Brain W WO Contrast FINDINGS: Stable small focus of encephalomalacia in the right frontal cortex and additional larger focus of encephalomalacia in the right lateral parietal lobe are stable from prior MRI. Several additional scattered T2 FLAIR hyperintense foci in the supratentorial white matter compatible with chronic microvascular ischemic changes are stable. Stable brain parenchymal volume loss. No diffusion to suggest acute or early subacute infarction. 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 Punctate focus of susceptibility hypointensity  within the right parietal white matter compatible with hemosiderin deposition of chronic microhemorrhage. No focal brain parenchymal mass effect, midline shift, effacement of basilar cisterns, or herniation.  Diffuse smooth pachymeningeal enhancement over the right cerebral convexity is increased in comparison with the prior MRI of the brain. There is mild mass effect on the underlying right cerebral hemisphere with sulcal effacement without midline shift.  Vascular: Normal flow voids.  Skull and upper cervical spine: Interval partial right temporal craniectomy with a small air and fluid-filled collection within the craniectomy bed as well as edema in the overlying scalp, likely postsurgical. A scalp fluid collection is present just above the right ear measuring 31 x 14 x 32 mm (AP x ML x CC series 12, image 16 and series 11, image 18).  Sinuses/Orbits: Mild diffuse paranasal sinus mucosal thickening greatest in the ethmoid air cells. No abnormal signal of mastoid air cells. Orbits are unremarkable.  IMPRESSION: 1. No acute process of the brain or abnormal enhancement identified. 2. Interval right temporal partial craniectomy from prior MRI. Edema and small fluid collection in the overlying scalp is likely postoperative. 3. Increased smooth pachymeningeal enhancement over the right cerebral convexity with mild mass effect on underlying brain islikely postoperative. Meningitis is possible given history of infected bone flap, but considered less likely. 4. 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 5. Stable chronic microvascular ischemic changes and parenchymal volume loss of the brain.  EEG Findings:  There is a posterior dominant rhythm of 9-10 Hz reactive to eye opening and closure.  No focal slowing is present.  Photic stimulation produces a symmetrical driving response, but does not  elicit any abnormalities.  Hyperventilation is not performed .  Sleep is not recorded.  There are no epileptiform discharges or electrographic seizures present.  Impression:  This is a normal EEG in the awake state.  However, this does not rule out seizures.  If clinical suspicion remains high , then a more prolonged EEG and/or sleep deprived EEG may be of additional value   Discharge Instructions: Discharge Instructions    Ambulatory referral to Occupational Therapy   Complete by:  As directed    Ambulatory referral to Physical Therapy   Complete by:  As directed    Iontophoresis - 4 mg/ml of dexamethasone:  No   T.E.N.S. Unit Evaluation and Dispense as Indicated:  No   Ambulatory referral to Physical Therapy   Complete by:  As directed    Call MD for:  difficulty breathing, headache or visual disturbances   Complete by:  As directed    Call MD for:  persistant dizziness or light-headedness   Complete by:  As directed    Call MD for:  persistant nausea and vomiting   Complete by:  As directed    Call MD for:  temperature >100.4   Complete by:  As directed    Diet - low sodium heart healthy   Complete by:  As directed    Discharge instructions   Complete by:  As directed    Mr. Neas,   The CT scan and MRI of the brain did not show evidence of a stroke or brain bleed. The EEG we did to monitor your brain activity, did not show evidence of seizure activity. This does not mean your symptoms were not due to a seizure.   I would recommend following up with your prior Neurologist Dr. Jannifer Franklin as soon as possible to discuss your symptoms and to closely monitor   Dr. Sherwood Gambler thought your symptoms were expected after the surgery you underwent. Please follow up with him at your scheduled follow up appointment. Follow up with Dr. Marla Roe in one week for suture removal.   Dr. Megan Salon will address your vancomycin dosing.   If your symptoms return, I would highly recommend going to the  Emergency Department for Evaluation.   Increase activity slowly   Complete by:  As directed       Signed: Melanee Spry, MD 09/09/2017, 11:58 AM   Pager: [phone]

## 2017-09-20 ENCOUNTER — Inpatient Hospital Stay: Payer: Medicare Other | Admitting: Internal Medicine

## 2017-10-11 ENCOUNTER — Other Ambulatory Visit: Payer: Self-pay | Admitting: Pharmacist

## 2017-10-11 ENCOUNTER — Ambulatory Visit (INDEPENDENT_AMBULATORY_CARE_PROVIDER_SITE_OTHER): Payer: Medicare Other | Admitting: Internal Medicine

## 2017-10-11 ENCOUNTER — Encounter: Payer: Self-pay | Admitting: Internal Medicine

## 2017-10-11 DIAGNOSIS — M869 Osteomyelitis, unspecified: Secondary | ICD-10-CM

## 2017-10-11 NOTE — Progress Notes (Signed)
[facility] for Infectious Disease  Patient Active Problem List   Diagnosis Date Noted  . Osteomyelitis of skull ([place]) 08/29/2017    Priority: High  . 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 Altered mental status 09/05/2017  . History of cranioplasty 03/07/2015  . Seizures ([place]) 11/27/2014  . Abnormal LFTs 11/27/2014  . UTI (urinary tract infection), bacterial 11/27/2014  . 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 Left-sided sensory deficit present 11/19/2014  . Sebaceous cyst x2 01/04/2014  . Prostate cancer ([place]) 12/12/2012  . Hypertension 10/21/2011  . Hyperlipidemia 10/21/2011  . Adenomatous colon polyp 10/21/2011  . Postop check 12/29/2010    Patient's Medications  New Prescriptions   No medications on file  Previous Medications   ACETAMINOPHEN (TYLENOL) 500 MG TABLET    Take 1,000 mg by mouth every 6 (six) hours as needed for headache (pain).   AMLODIPINE (NORVASC) 10 MG TABLET    Take 1 tablet (10 mg total) by mouth daily.   ATORVASTATIN (LIPITOR) 40 MG TABLET    Take 1 tablet (40 mg total) by mouth daily.   HYDROCHLOROTHIAZIDE (MICROZIDE) 12.5 MG CAPSULE    Take 1 capsule (12.5 mg total) by mouth daily.   LISINOPRIL (PRINIVIL,ZESTRIL) 40 MG TABLET    Take 1 tablet (40 mg total) by mouth daily.   ONDANSETRON (ZOFRAN) 4 MG TABLET    Take 1 tablet (4 mg total) by mouth every 8 (eight) hours as needed for nausea or vomiting.  Modified Medications   No medications on file  Discontinued Medications   VANCOMYCIN IVPB    Inject 1,250 mg into the vein every 12 (twelve) hours. Indication: osteomyelitis Last Day of Therapy: 10/10/17 Labs - Sunday/Monday:  CBC/D, BMP, and vancomycin trough. Labs - Thursday:  BMP and vancomycin trough Labs - Every other week:  ESR and CRP    Subjective: Martin Edwards is in with his daughter, Darrick Grinder, for his hospital follow-up visit.  He developed a subdural hematoma in 2016 and required craniectomy.  4 months later he underwent cranioplasty and bone flap  repair.  Several months ago the incision site started to break down and it became obvious to his daughter that a surgical screw was exposed.  The open wound gradually enlarged.  An MRI suggested osteomyelitis of the bone flap.  He underwent surgery with partial bone flap removal and wound closure.  Operative cultures grew 2 species of coagulase-negative staph and one diphtheroid.  He has now completed 6 weeks of IV vancomycin.  His wound is healing nicely.  He has not had any headache, fever, chills or sweats.  He is feeling better.  He has had no problems tolerating his vancomycin or pick.  Review of Systems: Review of Systems  Constitutional: Negative for chills, fever and malaise/fatigue.  Gastrointestinal: Negative for abdominal pain, diarrhea, nausea and vomiting.  Neurological: Negative for headaches.    Past Medical History:  Diagnosis Date  . Basal cell carcinoma of face X 2  . GERD (gastroesophageal reflux disease)   . History of kidney stones   . Hyperlipidemia   . Hypertension   . Prostate cancer ([name]) 1999  . Seizures ([place]) 11/2014   brain trauma -hematoma  . Small bowel obstruction due to adhesions ([place]) 01/30/2014  . Subdural hematoma (HCC)     Social History   Tobacco Use  . Smoking status: Former Research scientist (life sciences)  . Smokeless tobacco: Never Used  Substance Use Topics  . Alcohol use: No    Alcohol/week:  0.0 oz    Comment: 01/30/2014 "maybe a beer twice/yr"  . Drug use: No    Family History  Problem Relation Age of Onset  . Cancer Father        Lung    Allergies  Allergen Reactions  . Other Rash    Needs Hypoallergenic Sheets/ Laundry Cart had questionable allergic reaction to detergent last time he was here.    Objective: Vitals:   10/11/17 1026  BP: 136/82  Weight: 172 lb (78 kg)  Height: '5\' 10"'$  (1.778 m)   Body mass index is 24.68 kg/m.  Physical Exam  Constitutional: He is oriented to person, place, and time.  He is comfortable and in no distress.  He  is very pleasant.  HENT:  He has a right frontal bony defect.  His incision is healing nicely.  Neurological: He is alert and oriented to person, place, and time.  Skin: No rash noted.  His right arm PICC site looks good.  Psychiatric: He has a normal mood and affect.    Lab Results    Problem List Items Addressed This Visit      High   Osteomyelitis of skull ([place])    I am hopeful that he has osteomyelitis has now been cured.  I discussed the uncertainty as to optimal duration of therapy with him again today.  His sed rate was normal when he was hospitalized so repeating inflammatory markers today will not be helpful.  After this discussion they agreed to stop vancomycin now and have the PICC removed.  He will follow-up in 6 weeks.          Michel Bickers, MD [facility] for [facility] [phone] pager   [phone] cell 10/12/2017, 11:21 AM

## 2017-10-11 NOTE — Assessment & Plan Note (Addendum)
I am hopeful that he has osteomyelitis has now been cured.  I discussed the uncertainty as to optimal duration of therapy with him again today.  His sed rate was normal when he was hospitalized so repeating inflammatory markers today will not be helpful.  After this discussion they agreed to stop vancomycin now and have the PICC removed.  He will follow-up in 6 weeks.

## 2017-10-11 NOTE — Progress Notes (Signed)
Per Dr Campbell  41 cm  Single lumen Peripherally Inserted Central Catheter  removed from right basilic . No sutures present. Dressing was clean and dry . Area cleansed with chlorhexidine and petroleum dressing applied. Pt advised no heavy lifting with this arm, leave dressing for 24 hours and call the office if dressing becomes soaked with blood or sharp pain presents.  Pt tolerated procedure well.    Tammy K King, RN  

## 2017-10-11 NOTE — Progress Notes (Signed)
Patient scheduled to come in and see Dr. Megan Salon this morning for cranial osteo follow-up.  I have not received any labs on him since he has been on IV antibiotics (vancomycin).  Called and spoke to Denice Paradise, Software engineer at [facility], and he will fax me all the labs for the patient.

## 2017-11-11 ENCOUNTER — Encounter: Payer: Self-pay | Admitting: Emergency Medicine

## 2017-11-11 ENCOUNTER — Ambulatory Visit: Payer: Medicare Other | Admitting: Emergency Medicine

## 2017-11-11 VITALS — BP 163/78 | HR 112 | Temp 98.6°F | Resp 17 | Ht 71.0 in | Wt 168.0 lb

## 2017-11-11 DIAGNOSIS — I10 Essential (primary) hypertension: Secondary | ICD-10-CM | POA: Diagnosis not present

## 2017-11-11 DIAGNOSIS — E785 Hyperlipidemia, unspecified: Secondary | ICD-10-CM

## 2017-11-11 MED ORDER — AMLODIPINE BESYLATE 10 MG PO TABS: 10.0000 mg | ORAL_TABLET | Freq: Every day | ORAL | 3 refills | Status: DC

## 2017-11-11 MED ORDER — HYDROCHLOROTHIAZIDE 12.5 MG PO CAPS: 12.5000 mg | ORAL_CAPSULE | Freq: Every day | ORAL | 3 refills | Status: DC

## 2017-11-11 MED ORDER — ATORVASTATIN CALCIUM 40 MG PO TABS: 40.0000 mg | ORAL_TABLET | Freq: Every day | ORAL | 3 refills | Status: DC

## 2017-11-11 MED ORDER — LISINOPRIL 40 MG PO TABS: 40.0000 mg | ORAL_TABLET | Freq: Every day | ORAL | 3 refills | Status: DC

## 2017-11-11 NOTE — Progress Notes (Signed)
Martin Edwards 72 y.o.   Chief Complaint  Patient presents with  . Follow-up    norvasc,lipitor,microzide,lisinopril   . Medication Refill    HISTORY OF PRESENT ILLNESS: This is a 72 y.o. male here for follow-up of hypertension.  Needs medication refill.  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  Just finished a 6-week course of IV vancomycin.  Doing well.  Scheduled to follow-up with ID Dr. next week.  Has no complaints at present time.  HPI   Prior to Admission medications   Medication Sig Start Date End Date Taking? Authorizing Provider  amLODipine (NORVASC) 10 MG tablet Take 1 tablet (10 mg total) by mouth daily. 11/11/16  Yes Tammee Thielke, Ines Bloomer, MD  atorvastatin (LIPITOR) 40 MG tablet Take 1 tablet (40 mg total) by mouth daily. 11/11/16  Yes Trachelle Low, Ines Bloomer, MD  hydrochlorothiazide (MICROZIDE) 12.5 MG capsule Take 1 capsule (12.5 mg total) by mouth daily. 11/11/16  Yes Rafaela Dinius, Ines Bloomer, MD  lisinopril (PRINIVIL,ZESTRIL) 40 MG tablet Take 1 tablet (40 mg total) by mouth daily. 11/11/16  Yes Horald Pollen, MD    Allergies  Allergen Reactions  . Other Rash    Needs Hypoallergenic Sheets/ Laundry Cart had questionable allergic reaction to detergent last time he was here.    Patient Active Problem List   Diagnosis Date Noted  . 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 Altered mental status 09/05/2017  . 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 History of cranioplasty 03/07/2015  . Seizures ([place]) 11/27/2014  . Abnormal LFTs 11/27/2014  . UTI (urinary tract infection), bacterial 11/27/2014  . 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 Left-sided sensory deficit present 11/19/2014  . Sebaceous cyst x2 01/04/2014  . Prostate cancer ([place]) 12/12/2012  . Hypertension 10/21/2011  . Hyperlipidemia 10/21/2011  . Adenomatous colon polyp 10/21/2011  . Postop check 12/29/2010    Past Medical  History:  Diagnosis Date  . Basal cell carcinoma of face X 2  . GERD (gastroesophageal reflux disease)   . History of kidney stones   . Hyperlipidemia   . Hypertension   . Prostate cancer ([place]) 1999  . Seizures ([place]) 11/2014   brain trauma -hematoma  . Small bowel obstruction due to adhesions ([place]) 01/30/2014  . Subdural hematoma [facility])     Past Surgical History:  Procedure Laterality Date  . APPLICATION OF A-CELL OF HEAD/NECK N/A 08/29/2017   Procedure: APPLICATION OF A-CELL;  Surgeon: Wallace Going, DO;  Location: [place];  Service: Plastics;  Laterality: N/A;  . BASAL CELL CARCINOMA EXCISION  X 2   "off my face both times"  . BRAIN SURGERY    . COLECTOMY  10/2010   "took out 8 inches of colon; wasn't cancer"  . CRANIOTOMY Right 11/11/2014   Procedure: CRANIOTOMY HEMATOMA EVACUATION SUBDURAL;  Surgeon: Leeroy Cha, MD;  Location: [place] NEURO ORS;  Service: Neurosurgery;  Laterality: Right;  . CRANIOTOMY Right 11/13/2014   Procedure: Removal bone flap, insertion into the abdominal wall;  Surgeon: Leeroy Cha, MD;  Location: [place] [facility]UR[facility]S;  Service: Neurosurgery;  Laterality: Right;  . CRANIOTOMY N/A 03/07/2015   Procedure: Cranioplasty using Bone flap in abdomen;  Surgeon: Leeroy Cha, MD;  Location: [place] NEURO ORS;  Service: Neurosurgery;  Laterality: N/A;  Cranioplasty using Bone flap in abdomen  . CRANIOTOMY N/A 08/29/2017   Procedure: CRANIECTOMY AND DEBRIDEMENT;  Surgeon: Jovita Gamma, MD;  Location: [place];  Service: Neurosurgery;  Laterality: N/A;  CRANIECTOMY AND DEBRIDEMENT  . EXCISION MASS NECK Right 04/14/2016   Procedure: EXCISION BIOPSY OF RIGHT NECK LYMPH NODE;  Surgeon: Jerrell Belfast, MD;  Location: [place];  Service: ENT;  Laterality: Right;  . EXCISION MELANOMA WITH SENTINEL LYMPH NODE BIOPSY Right 04/14/2016   Procedure: WIDE EXCISION MELANOMA RIGHT SHOULDER WITH SENTINEL  NODE MAPPING;  Surgeon: Autumn Messing III, MD;  Location: [place];  Service: General;  Laterality:  Right;  . PROSTATECTOMY  05/20/1998  . SCALP LACERATION REPAIR N/A 08/29/2017   Procedure: SCALP FLAP TO COVER DEFECT;  Surgeon: Wallace Going, DO;  Location: [place];  Service: Plastics;  Laterality: N/A;    Social History   Socioeconomic History  . Marital status: Divorced    Spouse name: Not on file  . Number of children: 2  . Years of education: 40  . Highest education level: Not on file  Occupational History  . Occupation: retired  Scientific laboratory technician  . Financial resource strain: Not on file  . Food insecurity:    Worry: Not on file    Inability: Not on file  . Transportation needs:    Medical: Not on file    Non-medical: Not on file  Tobacco Use  . Smoking status: Former Research scientist (life sciences)  . Smokeless tobacco: Never Used  Substance and Sexual Activity  . Alcohol use: No    Alcohol/week: 0.0 oz    Comment: 01/30/2014 "maybe a beer twice/yr"  . Drug use: No  . Sexual activity: Not Currently  Lifestyle  . Physical activity:    Days per week: Not on file    Minutes per session: Not on file  . Stress: Not on file  Relationships  . Social connections:    Talks on phone: Not on file    Gets together: Not on file    Attends religious service: Not on file    Active member of club or organization: Not on file    Attends meetings of clubs or organizations: Not on file    Relationship status: Not on file  . Intimate partner violence:    Fear of current or ex partner: Not on file    Emotionally abused: Not on file    Physically abused: Not on file    Forced sexual activity: Not on file  Other Topics Concern  . Not on file  Social History Narrative   Exercise: 2-3 times a wk      Patient drinks 2 cups of caffeine daily.   Patient is right handed.    Family History  Problem Relation Age of Onset  . Cancer Father        Lung     Review of Systems  Constitutional: Negative.  Negative for chills and fever.  HENT: Negative.  Negative for sore throat.   Eyes: Negative.   Negative for blurred vision and double vision.  Respiratory: Negative.  Negative for cough and shortness of breath.   Cardiovascular: Negative.  Negative for chest pain and palpitations.  Gastrointestinal: Negative.  Negative for abdominal pain, diarrhea, nausea and vomiting.  Genitourinary: Negative.  Negative for dysuria and hematuria.  Musculoskeletal: Negative.  Negative for back pain, myalgias and neck pain.  Skin: Negative.  Negative for rash.  Neurological: Negative.  Negative for dizziness and headaches.  Endo/Heme/Allergies: Negative.   All other systems reviewed and are negative.  Vitals:   11/11/17 0939  BP: (!) 163/78  Pulse: (!) 112  Resp: 17  Temp: 98.6 F (37 C)  SpO2: 98%     Physical Exam  Constitutional: He is oriented to person, place, and time. He appears well-developed and well-nourished.  HENT:  Head: Normocephalic.  Nose: Nose normal.  Mouth/Throat: Oropharynx is clear and moist.  Scalp healing well from recent osteomyelitis.  Eyes: Pupils are equal, round, and reactive to light. Conjunctivae and EOM are normal.  Neck: Normal range of motion. Neck supple.  Cardiovascular: Normal rate, regular rhythm and normal heart sounds.  Pulmonary/Chest: Effort normal and breath sounds normal.  Abdominal: Soft. He exhibits no distension. There is no tenderness.  Musculoskeletal: Normal range of motion.  Lymphadenopathy:    He has no cervical adenopathy.  Neurological: He is alert and oriented to person, place, and time. No sensory deficit. He exhibits normal muscle tone.  Skin: Skin is warm and dry. Capillary refill takes less than 2 seconds.  Psychiatric: He has a normal mood and affect. His behavior is normal.  Vitals reviewed.   A total of 25 minutes was spent in the room with the patient, greater than 50% of which was in counseling/coordination of care regarding chronic medical conditions, including hypertension, management, medications, prognosis and need for  follow-up.  ASSESSMENT & PLAN: Kaulder was seen today for follow-up and medication refill.  Diagnoses and all orders for this visit:  Essential hypertension -     amLODipine (NORVASC) 10 MG tablet; Take 1 tablet (10 mg total) by mouth daily. -     hydrochlorothiazide (MICROZIDE) 12.5 MG capsule; Take 1 capsule (12.5 mg total) by mouth daily. -     lisinopril (PRINIVIL,ZESTRIL) 40 MG tablet; Take 1 tablet (40 mg total) by mouth daily.  Hyperlipidemia, unspecified hyperlipidemia type -     atorvastatin (LIPITOR) 40 MG tablet; Take 1 tablet (40 mg total) by mouth daily.    Patient Instructions       IF you received an x-ray today, you will receive an invoice from [facility]. Please contact [facility] at [phone] with questions or concerns regarding your invoice.   IF you received labwork today, you will receive an invoice from [facility]. Please contact [facility] at [phone] with questions or concerns regarding your invoice.   Our billing staff will not be able to assist you with questions regarding bills from these companies.  You will be contacted with the lab results as soon as they are available. The fastest way to get your results is to activate your My Chart account. Instructions are located on the last page of this paperwork. If you have not heard from [place] regarding the results in 2 weeks, please contact this office.      Hypertension Hypertension, commonly called high blood pressure, is when the force of blood pumping through the arteries is too strong. The arteries are the blood vessels that carry blood from the heart throughout the body. Hypertension forces the heart to work harder to pump blood and may cause arteries to become narrow or stiff. Having untreated or uncontrolled hypertension can cause heart attacks, strokes, kidney disease, and other problems. A blood pressure reading consists of a higher number over a lower number. Ideally, your blood  pressure should be below 120/80. The first ("top") number is called the systolic pressure. It is a measure of the pressure in your arteries as your heart beats. The second ("bottom") number is called the diastolic pressure. It is a measure of the pressure in your arteries as the heart relaxes. What are the causes? The  cause of this condition is not known. What increases the risk? Some risk factors for high blood pressure are under your control. Others are not. Factors you can change  Smoking.  Having type 2 diabetes mellitus, high cholesterol, or both.  Not getting enough exercise or physical activity.  Being overweight.  Having too much fat, sugar, calories, or salt (sodium) in your diet.  Drinking too much alcohol. Factors that are difficult or impossible to change  Having chronic kidney disease.  Having a family history of high blood pressure.  Age. Risk increases with age.  Race. You may be at higher risk if you are African-American.  Gender. Men are at higher risk than women before age 24. After age 31, women are at higher risk than men.  Having obstructive sleep apnea.  Stress. What are the signs or symptoms? Extremely high blood pressure (hypertensive crisis) may cause:  Headache.  Anxiety.  Shortness of breath.  Nosebleed.  Nausea and vomiting.  Severe chest pain.  Jerky movements you cannot control (seizures).  How is this diagnosed? This condition is diagnosed by measuring your blood pressure while you are seated, with your arm resting on a surface. The cuff of the blood pressure monitor will be placed directly against the skin of your upper arm at the level of your heart. It should be measured at least twice using the same arm. Certain conditions can cause a difference in blood pressure between your right and left arms. Certain factors can cause blood pressure readings to be lower or higher than normal (elevated) for a short period of time:  When your  blood pressure is higher when you are in a health care provider's office than when you are at home, this is called white coat hypertension. Most people with this condition do not need medicines.  When your blood pressure is higher at home than when you are in a health care provider's office, this is called masked hypertension. Most people with this condition may need medicines to control blood pressure.  If you have a high blood pressure reading during one visit or you have normal blood pressure with other risk factors:  You may be asked to return on a different day to have your blood pressure checked again.  You may be asked to monitor your blood pressure at home for 1 week or longer.  If you are diagnosed with hypertension, you may have other blood or imaging tests to help your health care provider understand your overall risk for other conditions. How is this treated? This condition is treated by making healthy lifestyle changes, such as eating healthy foods, exercising more, and reducing your alcohol intake. Your health care provider may prescribe medicine if lifestyle changes are not enough to get your blood pressure under control, and if:  Your systolic blood pressure is above 130.  Your diastolic blood pressure is above 80.  Your personal target blood pressure may vary depending on your medical conditions, your age, and other factors. Follow these instructions at home: Eating and drinking  Eat a diet that is high in fiber and potassium, and low in sodium, added sugar, and fat. An example eating plan is called the DASH (Dietary Approaches to Stop Hypertension) diet. To eat this way: ? Eat plenty of fresh fruits and vegetables. Try to fill half of your plate at each meal with fruits and vegetables. ? Eat whole grains, such as whole wheat pasta, brown rice, or whole grain bread. Fill about one quarter  of your plate with whole grains. ? Eat or drink low-fat dairy products, such as skim  milk or low-fat yogurt. ? Avoid fatty cuts of meat, processed or cured meats, and poultry with skin. Fill about one quarter of your plate with lean proteins, such as fish, chicken without skin, beans, eggs, and tofu. ? Avoid premade and processed foods. These tend to be higher in sodium, added sugar, and fat.  Reduce your daily sodium intake. Most people with hypertension should eat less than 1,500 mg of sodium a day.  Limit alcohol intake to no more than 1 drink a day for nonpregnant women and 2 drinks a day for men. One drink equals 12 oz of beer, 5 oz of wine, or 1 oz of hard liquor. Lifestyle  Work with your health care provider to maintain a healthy body weight or to lose weight. Ask what an ideal weight is for you.  Get at least 30 minutes of exercise that causes your heart to beat faster (aerobic exercise) most days of the week. Activities may include walking, swimming, or biking.  Include exercise to strengthen your muscles (resistance exercise), such as pilates or lifting weights, as part of your weekly exercise routine. Try to do these types of exercises for 30 minutes at least 3 days a week.  Do not use any products that contain nicotine or tobacco, such as cigarettes and e-cigarettes. If you need help quitting, ask your health care provider.  Monitor your blood pressure at home as told by your health care provider.  Keep all follow-up visits as told by your health care provider. This is important. Medicines  Take over-the-counter and prescription medicines only as told by your health care provider. Follow directions carefully. Blood pressure medicines must be taken as prescribed.  Do not skip doses of blood pressure medicine. Doing this puts you at risk for problems and can make the medicine less effective.  Ask your health care provider about side effects or reactions to medicines that you should watch for. Contact a health care provider if:  You think you are having a  reaction to a medicine you are taking.  You have headaches that keep coming back (recurring).  You feel dizzy.  You have swelling in your ankles.  You have trouble with your vision. Get help right away if:  You develop a severe headache or confusion.  You have unusual weakness or numbness.  You feel faint.  You have severe pain in your chest or abdomen.  You vomit repeatedly.  You have trouble breathing. Summary  Hypertension is when the force of blood pumping through your arteries is too strong. If this condition is not controlled, it may put you at risk for serious complications.  Your personal target blood pressure may vary depending on your medical conditions, your age, and other factors. For most people, a normal blood pressure is less than 120/80.  Hypertension is treated with lifestyle changes, medicines, or a combination of both. Lifestyle changes include weight loss, eating a healthy, low-sodium diet, exercising more, and limiting alcohol. This information is not intended to replace advice given to you by your health care provider. Make sure you discuss any questions you have with your health care provider. Document Released: 05/31/2005 Document Revised: 04/28/2016 Document Reviewed: 04/28/2016 Elsevier Interactive Patient Education  2018 [employer]      Agustina Caroli, MD [facility]

## 2017-11-11 NOTE — Patient Instructions (Addendum)
   IF you received an x-ray today, you will receive an invoice from [facility]. Please contact [facility] at [phone] with questions or concerns regarding your invoice.   IF you received labwork today, you will receive an invoice from [facility]. Please contact [facility] at [phone] with questions or concerns regarding your invoice.   Our billing staff will not be able to assist you with questions regarding bills from these companies.  You will be contacted with the lab results as soon as they are available. The fastest way to get your results is to activate your My Chart account. Instructions are located on the last page of this paperwork. If you have not heard from us regarding the results in 2 weeks, please contact this office.     Hypertension Hypertension, commonly called high blood pressure, is when the force of blood pumping through the arteries is too strong. The arteries are the blood vessels that carry blood from the heart throughout the body. Hypertension forces the heart to work harder to pump blood and may cause arteries to become narrow or stiff. Having untreated or uncontrolled hypertension can cause heart attacks, strokes, kidney disease, and other problems. A blood pressure reading consists of a higher number over a lower number. Ideally, your blood pressure should be below 120/80. The first ("top") number is called the systolic pressure. It is a measure of the pressure in your arteries as your heart beats. The second ("bottom") number is called the diastolic pressure. It is a measure of the pressure in your arteries as the heart relaxes. What are the causes? The cause of this condition is not known. What increases the risk? Some risk factors for high blood pressure are under your control. Others are not. Factors you can change  Smoking.  Having type 2 diabetes mellitus, high cholesterol, or both.  Not getting enough exercise or physical  activity.  Being overweight.  Having too much fat, sugar, calories, or salt (sodium) in your diet.  Drinking too much alcohol. Factors that are difficult or impossible to change  Having chronic kidney disease.  Having a family history of high blood pressure.  Age. Risk increases with age.  Race. You may be at higher risk if you are African-American.  Gender. Men are at higher risk than women before age 45. After age 65, women are at higher risk than men.  Having obstructive sleep apnea.  Stress. What are the signs or symptoms? Extremely high blood pressure (hypertensive crisis) may cause:  Headache.  Anxiety.  Shortness of breath.  Nosebleed.  Nausea and vomiting.  Severe chest pain.  Jerky movements you cannot control (seizures).  How is this diagnosed? This condition is diagnosed by measuring your blood pressure while you are seated, with your arm resting on a surface. The cuff of the blood pressure monitor will be placed directly against the skin of your upper arm at the level of your heart. It should be measured at least twice using the same arm. Certain conditions can cause a difference in blood pressure between your right and left arms. Certain factors can cause blood pressure readings to be lower or higher than normal (elevated) for a short period of time:  When your blood pressure is higher when you are in a health care provider's office than when you are at home, this is called white coat hypertension. Most people with this condition do not need medicines.  When your blood pressure is higher at home than when you   are in a health care provider's office, this is called masked hypertension. Most people with this condition may need medicines to control blood pressure.  If you have a high blood pressure reading during one visit or you have normal blood pressure with other risk factors:  You may be asked to return on a different day to have your blood pressure  checked again.  You may be asked to monitor your blood pressure at home for 1 week or longer.  If you are diagnosed with hypertension, you may have other blood or imaging tests to help your health care provider understand your overall risk for other conditions. How is this treated? This condition is treated by making healthy lifestyle changes, such as eating healthy foods, exercising more, and reducing your alcohol intake. Your health care provider may prescribe medicine if lifestyle changes are not enough to get your blood pressure under control, and if:  Your systolic blood pressure is above 130.  Your diastolic blood pressure is above 80.  Your personal target blood pressure may vary depending on your medical conditions, your age, and other factors. Follow these instructions at home: Eating and drinking  Eat a diet that is high in fiber and potassium, and low in sodium, added sugar, and fat. An example eating plan is called the DASH (Dietary Approaches to Stop Hypertension) diet. To eat this way: ? Eat plenty of fresh fruits and vegetables. Try to fill half of your plate at each meal with fruits and vegetables. ? Eat whole grains, such as whole wheat pasta, brown rice, or whole grain bread. Fill about one quarter of your plate with whole grains. ? Eat or drink low-fat dairy products, such as skim milk or low-fat yogurt. ? Avoid fatty cuts of meat, processed or cured meats, and poultry with skin. Fill about one quarter of your plate with lean proteins, such as fish, chicken without skin, beans, eggs, and tofu. ? Avoid premade and processed foods. These tend to be higher in sodium, added sugar, and fat.  Reduce your daily sodium intake. Most people with hypertension should eat less than 1,500 mg of sodium a day.  Limit alcohol intake to no more than 1 drink a day for nonpregnant women and 2 drinks a day for men. One drink equals 12 oz of beer, 5 oz of wine, or 1 oz of hard  liquor. Lifestyle  Work with your health care provider to maintain a healthy body weight or to lose weight. Ask what an ideal weight is for you.  Get at least 30 minutes of exercise that causes your heart to beat faster (aerobic exercise) most days of the week. Activities may include walking, swimming, or biking.  Include exercise to strengthen your muscles (resistance exercise), such as pilates or lifting weights, as part of your weekly exercise routine. Try to do these types of exercises for 30 minutes at least 3 days a week.  Do not use any products that contain nicotine or tobacco, such as cigarettes and e-cigarettes. If you need help quitting, ask your health care provider.  Monitor your blood pressure at home as told by your health care provider.  Keep all follow-up visits as told by your health care provider. This is important. Medicines  Take over-the-counter and prescription medicines only as told by your health care provider. Follow directions carefully. Blood pressure medicines must be taken as prescribed.  Do not skip doses of blood pressure medicine. Doing this puts you at risk for problems and   can make the medicine less effective.  Ask your health care provider about side effects or reactions to medicines that you should watch for. Contact a health care provider if:  You think you are having a reaction to a medicine you are taking.  You have headaches that keep coming back (recurring).  You feel dizzy.  You have swelling in your ankles.  You have trouble with your vision. Get help right away if:  You develop a severe headache or confusion.  You have unusual weakness or numbness.  You feel faint.  You have severe pain in your chest or abdomen.  You vomit repeatedly.  You have trouble breathing. Summary  Hypertension is when the force of blood pumping through your arteries is too strong. If this condition is not controlled, it may put you at risk for serious  complications.  Your personal target blood pressure may vary depending on your medical conditions, your age, and other factors. For most people, a normal blood pressure is less than 120/80.  Hypertension is treated with lifestyle changes, medicines, or a combination of both. Lifestyle changes include weight loss, eating a healthy, low-sodium diet, exercising more, and limiting alcohol. This information is not intended to replace advice given to you by your health care provider. Make sure you discuss any questions you have with your health care provider. Document Released: 05/31/2005 Document Revised: 04/28/2016 Document Reviewed: 04/28/2016 Elsevier Interactive Patient Education  2018 Elsevier Inc.  

## 2017-11-17 ENCOUNTER — Ambulatory Visit (INDEPENDENT_AMBULATORY_CARE_PROVIDER_SITE_OTHER): Payer: Medicare Other | Admitting: Internal Medicine

## 2017-11-17 ENCOUNTER — Encounter: Payer: Self-pay | Admitting: Internal Medicine

## 2017-11-17 DIAGNOSIS — M869 Osteomyelitis, unspecified: Secondary | ICD-10-CM | POA: Diagnosis not present

## 2017-11-17 NOTE — Progress Notes (Signed)
[facility] for Infectious Disease  Patient Active Problem List   Diagnosis Date Noted  . 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 Altered mental status 09/05/2017  . History of cranioplasty 03/07/2015  . Seizures ([place]) 11/27/2014  . Abnormal LFTs 11/27/2014  . UTI (urinary tract infection), bacterial 11/27/2014  . 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 Left-sided sensory deficit present 11/19/2014  . Sebaceous cyst x2 01/04/2014  . Prostate cancer ([facility]) 12/12/2012  . Hypertension 10/21/2011  . Hyperlipidemia 10/21/2011  . Adenomatous colon polyp 10/21/2011  . Postop check 12/29/2010    Patient's Medications  New Prescriptions   No medications on file  Previous Medications   AMLODIPINE (NORVASC) 10 MG TABLET    Take 1 tablet (10 mg total) by mouth daily.   ATORVASTATIN (LIPITOR) 40 MG TABLET    Take 1 tablet (40 mg total) by mouth daily.   HYDROCHLOROTHIAZIDE (MICROZIDE) 12.5 MG CAPSULE    Take 1 capsule (12.5 mg total) by mouth daily.   LISINOPRIL (PRINIVIL,ZESTRIL) 40 MG TABLET    Take 1 tablet (40 mg total) by mouth daily.  Modified Medications   No medications on file  Discontinued Medications   No medications on file    Subjective: [name] is in with his son, Martin Edwards, for his routine follow-up visit.  He completed 6 weeks of vancomycin for his cranial osteomyelitis on 10/11/2017.  He is feeling better.  His scalp incision is healing nicely.  He has had no drainage from his incision.  He has had no fever or headache.  Unfortunately there is been no improvement in his right ptosis since his latest surgery.  Review of Systems: Review of Systems  Constitutional: Negative for chills, diaphoresis, fever and weight loss.  Eyes:       Right ptosis is noted in HPI  Neurological: Negative for headaches.    Past Medical History:  Diagnosis Date  . Basal cell carcinoma of face X 2  . GERD (gastroesophageal reflux  disease)   . History of kidney stones   . Hyperlipidemia   . Hypertension   . Prostate cancer ([facility]) 1999  . Seizures ([facility]) 11/2014   brain trauma -hematoma  . Small bowel obstruction due to adhesions ([facility]) 01/30/2014  . Subdural hematoma ([facility])     Social History   Tobacco Use  . Smoking status: Former Research scientist (life sciences)  . Smokeless tobacco: Never Used  Substance Use Topics  . Alcohol use: No    Alcohol/week: 0.0 oz    Comment: 01/30/2014 "maybe a beer twice/yr"  . Drug use: No    Family History  Problem Relation Age of [place]  . Cancer Father        Lung    Allergies  Allergen Reactions  . Other Rash    Needs Hypoallergenic Sheets/ Laundry Cart had questionable allergic reaction to detergent last time he was here.    Objective: Vitals:   11/17/17 1044  BP: (!) 157/77  Pulse: 94  Temp: 98.3 F (36.8 C)  TempSrc: Oral  Weight: 169 lb (76.7 kg)  Height: 5\' 10"  (1.778 m)   Body mass index is 24.25 kg/m.  Physical Exam  Constitutional: He is oriented to person, place, and time.  He is pleasant and in good spirits.  HENT:  There are a few scabbed areas along his right frontotemporal incision but there are no open areas or drainage.  There is  no surrounding cellulitis or fluctuance the area of the bony defect is sunken.  Neurological: He is alert and oriented to person, place, and time.  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  Skin: No rash noted.  Psychiatric: He has a normal mood and affect.    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  I will have him follow-up at least one more time in 6 weeks.          Michel Bickers, MD [facility] [phone] pager   [phone] cell 11/17/2017, 12:54 PM

## 2017-11-17 NOTE — Assessment & Plan Note (Signed)
He is showing no signs of persistent cranial osteomyelitis 6 weeks after completing IV vancomycin therapy.  I will have him follow-up at least one more time in 6 weeks.

## 2017-11-22 ENCOUNTER — Ambulatory Visit: Payer: Medicare Other | Admitting: Internal Medicine

## 2018-03-16 ENCOUNTER — Encounter: Payer: Self-pay | Admitting: Plastic Surgery

## 2018-03-16 ENCOUNTER — Ambulatory Visit (INDEPENDENT_AMBULATORY_CARE_PROVIDER_SITE_OTHER): Payer: Medicare Other | Admitting: Plastic Surgery

## 2018-03-16 DIAGNOSIS — H02831 Dermatochalasis of right upper eyelid: Secondary | ICD-10-CM

## 2018-03-16 DIAGNOSIS — H02839 Dermatochalasis of unspecified eye, unspecified eyelid: Secondary | ICD-10-CM | POA: Insufficient documentation

## 2018-03-16 DIAGNOSIS — H57819 Brow ptosis, unspecified: Secondary | ICD-10-CM | POA: Diagnosis not present

## 2018-03-16 DIAGNOSIS — H02834 Dermatochalasis of left upper eyelid: Secondary | ICD-10-CM | POA: Diagnosis not present

## 2018-03-16 NOTE — Progress Notes (Signed)
Patient ID: Martin Edwards, male    DOB: December 13, 1945, 72 y.o.   MRN: [id number]   Chief Complaint  Patient presents with  . Eyelid Problem  . Visual Field Change    The patient is a 72 yrs old wm here with his son for evaluation of his eyelids and brows.  He underwent extensive surgery in the past for for a subdural hematoma (2016).  This included a craniotomy and evacuation with several subsequent procedures for osteomyelitis and dural defects. The right temple bone area is stable and overall he has been doing very well at home.  He has strong family support.  He is now suffering from extreme visual field limitations due to the excess skin on his upper lids and the inability to lift the brow on the right.  He has ptosis on both sides with the right much worse due to the nerve damage and paralysis.  His visual field exam supports the need for surgical intervention.  It is difficult for his to see due to the hooding even in the morning and is extremely sever by the evening.  Nothing make it better long term.  He has to lift is brow or lid manually to see.     Review of Systems  Constitutional: Negative for activity change and appetite change.  HENT: Negative.   Eyes: Positive for visual disturbance. Negative for discharge.  Respiratory: Negative.   Cardiovascular: Negative.   Gastrointestinal: Negative.   Endocrine: Negative.   Genitourinary: Negative.   Musculoskeletal: Negative.   Skin: Negative.   Neurological: Positive for facial asymmetry.    Past Medical History:  Diagnosis Date  . Basal cell carcinoma of face X 2  . GERD (gastroesophageal reflux disease)   . History of kidney stones   . Hyperlipidemia   . Hypertension   . Prostate cancer ([place]) 1999  . Seizures ([place]) 11/2014   brain trauma -hematoma  . Small bowel obstruction due to adhesions ([place]) 01/30/2014  . Subdural hematoma [facility])     Past Surgical History:  Procedure Laterality Date  . APPLICATION OF A-CELL OF  HEAD/NECK N/A 08/29/2017   Procedure: APPLICATION OF A-CELL;  Surgeon: Wallace Going, DO;  Location: [place];  Service: Plastics;  Laterality: N/A;  . BASAL CELL CARCINOMA EXCISION  X 2   "off my face both times"  . BRAIN SURGERY    . COLECTOMY  10/2010   "took out 8 inches of colon; wasn't cancer"  . CRANIOTOMY Right 11/11/2014   Procedure: CRANIOTOMY HEMATOMA EVACUATION SUBDURAL;  Surgeon: Leeroy Cha, MD;  Location: [place];  Service: Neurosurgery;  Laterality: Right;  . CRANIOTOMY Right 11/13/2014   Procedure: Removal bone flap, insertion into the abdominal wall;  Surgeon: Leeroy Cha, MD;  Location: [place] [facility];  Service: Neurosurgery;  Laterality: Right;  . CRANIOTOMY N/A 03/07/2015   Procedure: Cranioplasty using Bone flap in abdomen;  Surgeon: Leeroy Cha, MD;  Location: [facility];  Service: Neurosurgery;  Laterality: N/A;  Cranioplasty using Bone flap in abdomen  . CRANIOTOMY N/A 08/29/2017   Procedure: CRANIECTOMY AND DEBRIDEMENT;  Surgeon: Jovita Gamma, MD;  Location: [facility];  Service: Neurosurgery;  Laterality: N/A;  CRANIECTOMY AND DEBRIDEMENT  . EXCISION MASS NECK Right 04/14/2016   Procedure: EXCISION BIOPSY OF RIGHT NECK LYMPH NODE;  Surgeon: Jerrell Belfast, MD;  Location: [facility];  Service: ENT;  Laterality: Right;  . EXCISION MELANOMA WITH SENTINEL LYMPH NODE BIOPSY Right 04/14/2016   Procedure:  WIDE EXCISION MELANOMA RIGHT SHOULDER WITH SENTINEL  NODE MAPPING;  Surgeon: Autumn Messing III, MD;  Location: [facility];  Service: General;  Laterality: Right;  . PROSTATECTOMY  05/20/1998  . SCALP LACERATION REPAIR N/A 08/29/2017   Procedure: SCALP FLAP TO COVER DEFECT;  Surgeon: Wallace Going, DO;  Location: [facility];  Service: Plastics;  Laterality: N/A;      Current Outpatient Medications:  .  amLODipine (NORVASC) 10 MG tablet, Take 1 tablet (10 mg total) by mouth daily., Disp: 90 tablet, Rfl: 3 .  atorvastatin (LIPITOR) 40 MG tablet, Take 1 tablet (40 mg total) by  mouth daily., Disp: 90 tablet, Rfl: 3 .  hydrochlorothiazide (MICROZIDE) 12.5 MG capsule, Take 1 capsule (12.5 mg total) by mouth daily., Disp: 90 capsule, Rfl: 3 .  lisinopril (PRINIVIL,ZESTRIL) 40 MG tablet, Take 1 tablet (40 mg total) by mouth daily., Disp: 90 tablet, Rfl: 3   Objective:   There were no vitals filed for this visit.  Physical Exam  Constitutional: He is oriented to person, place, and time. He appears well-developed and well-nourished.  HENT:  Right Ear: External ear normal.  Left Ear: External ear normal.  Eyes: Pupils are equal, round, and reactive to light. EOM are normal.  Cardiovascular: Normal rate.  Pulmonary/Chest: Effort normal.  Neurological: He is alert and oriented to person, place, and time.  Skin: Skin is warm.  Psychiatric: He has a normal mood and affect. His behavior is normal.    Assessment & Plan:  Brow ptosis  Dermatochalasis of both upper eyelids  Strongly recommend treatment for independent living and quality of life.  He needs bilateral upper lid blepharoplasty with bilateral brow lift.  We discussed the postoperative course.  He wishes to proceed.  [place], DO

## 2018-04-20 ENCOUNTER — Encounter (HOSPITAL_BASED_OUTPATIENT_CLINIC_OR_DEPARTMENT_OTHER): Payer: Self-pay | Admitting: *Deleted

## 2018-04-21 ENCOUNTER — Other Ambulatory Visit: Payer: Self-pay

## 2018-04-21 ENCOUNTER — Encounter (HOSPITAL_BASED_OUTPATIENT_CLINIC_OR_DEPARTMENT_OTHER): Payer: Self-pay | Admitting: *Deleted

## 2018-04-26 ENCOUNTER — Ambulatory Visit: Payer: Medicare Other | Admitting: Emergency Medicine

## 2018-04-26 ENCOUNTER — Encounter: Payer: Self-pay | Admitting: Emergency Medicine

## 2018-04-26 ENCOUNTER — Other Ambulatory Visit: Payer: Self-pay

## 2018-04-26 VITALS — BP 139/77 | HR 112 | Temp 98.6°F | Resp 16 | Ht 69.5 in | Wt 167.2 lb

## 2018-04-26 DIAGNOSIS — E785 Hyperlipidemia, unspecified: Secondary | ICD-10-CM

## 2018-04-26 DIAGNOSIS — I10 Essential (primary) hypertension: Secondary | ICD-10-CM

## 2018-04-26 NOTE — Progress Notes (Signed)
Martin Edwards 72 y.o.   Chief Complaint  Patient presents with  . Hypertension    6 month follow up    HISTORY OF PRESENT ILLNESS: This is a 72 y.o. male with history of hypertension here for follow-up.  Doing well with no current complaints or medical concerns.  Compliant with medications amlodipine, HCTZ, and lisinopril.  BP Readings from Last 3 Encounters:  04/26/18 139/77  11/17/17 (!) 157/77  11/11/17 (!) 163/78    HPI   Prior to Admission medications   Medication Sig Start Date End Date Taking? Authorizing Provider  amLODipine (NORVASC) 10 MG tablet Take 1 tablet (10 mg total) by mouth daily. 11/11/17  Yes Dolores Mcgovern, Ines Bloomer, MD  atorvastatin (LIPITOR) 40 MG tablet Take 1 tablet (40 mg total) by mouth daily. 11/11/17  Yes Husna Krone, Ines Bloomer, MD  hydrochlorothiazide (MICROZIDE) 12.5 MG capsule Take 1 capsule (12.5 mg total) by mouth daily. 11/11/17  Yes Sorren Vallier, Ines Bloomer, MD  lisinopril (PRINIVIL,ZESTRIL) 40 MG tablet Take 1 tablet (40 mg total) by mouth daily. 11/11/17  Yes Horald Pollen, MD    Allergies  Allergen Reactions  . Other Rash    Needs Hypoallergenic Sheets/ Laundry Cart had questionable allergic reaction to detergent last time he was here.    Patient Active Problem List   Diagnosis Date Noted  . 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 Altered mental status 09/05/2017  . 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 History of cranioplasty 03/07/2015  . Seizures ([place]) 11/27/2014  . Abnormal LFTs 11/27/2014  . UTI (urinary tract infection), bacterial 11/27/2014  . 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 Left-sided sensory deficit present 11/19/2014  . Sebaceous cyst x2 01/04/2014  . Prostate cancer ([place]) 12/12/2012  . Hypertension 10/21/2011  . Hyperlipidemia 10/21/2011  . Adenomatous colon polyp 10/21/2011  . Postop check 12/29/2010    Past Medical History:  Diagnosis Date  . Basal cell carcinoma  of face X 2  . Brow ptosis   . GERD (gastroesophageal reflux disease)   . History of kidney stones   . Hyperlipidemia   . Hypertension   . Prostate cancer ([place]) 1999  . Seizures ([place]) 11/2014   brain trauma -hematoma  . Small bowel obstruction due to adhesions ([place]) 01/30/2014  . Subdural hematoma [facility])     Past Surgical History:  Procedure Laterality Date  . APPLICATION OF A-CELL OF HEAD/NECK N/A 08/29/2017   Procedure: APPLICATION OF A-CELL;  Surgeon: Wallace Going, DO;  Location: [place];  Service: Plastics;  Laterality: N/A;  . BASAL CELL CARCINOMA EXCISION  X 2   "off my face both times"  . BRAIN SURGERY    . COLECTOMY  10/2010   "took out 8 inches of colon; wasn't cancer"  . CRANIOTOMY Right 11/11/2014   Procedure: CRANIOTOMY HEMATOMA EVACUATION SUBDURAL;  Surgeon: Leeroy Cha, MD;  Location: [place] NEURO ORS;  Service: Neurosurgery;  Laterality: Right;  . CRANIOTOMY Right 11/13/2014   Procedure: Removal bone flap, insertion into the abdominal wall;  Surgeon: Leeroy Cha, MD;  Location: [place] NEURO ORS;  Service: Neurosurgery;  Laterality: Right;  . CRANIOTOMY N/A 03/07/2015   Procedure: Cranioplasty using Bone flap in abdomen;  Surgeon: Leeroy Cha, MD;  Location: [place] NEURO ORS;  Service: Neurosurgery;  Laterality: N/A;  Cranioplasty using Bone flap in abdomen  . CRANIOTOMY N/A 08/29/2017   Procedure: CRANIECTOMY AND DEBRIDEMENT;  Surgeon: Jovita Gamma, MD;  Location: [place];  Service: Neurosurgery;  Laterality: N/A;  CRANIECTOMY AND DEBRIDEMENT  . EXCISION MASS NECK Right 04/14/2016   Procedure: EXCISION BIOPSY OF RIGHT NECK LYMPH NODE;  Surgeon: Jerrell Belfast, MD;  Location: [place];  Service: ENT;  Laterality: Right;  . EXCISION MELANOMA WITH SENTINEL LYMPH NODE BIOPSY Right 04/14/2016   Procedure: WIDE EXCISION MELANOMA RIGHT SHOULDER WITH SENTINEL  NODE MAPPING;  Surgeon: Autumn Messing III, MD;  Location: [place];  Service: General;  Laterality: Right;  . PROSTATECTOMY   05/20/1998  . SCALP LACERATION REPAIR N/A 08/29/2017   Procedure: SCALP FLAP TO COVER DEFECT;  Surgeon: Wallace Going, DO;  Location: [place];  Service: Plastics;  Laterality: N/A;    Social History   Socioeconomic History  . Marital status: Divorced    Spouse name: Not on file  . Number of children: 2  . Years of education: 54  . Highest education level: Not on file  Occupational History  . Occupation: retired  Scientific laboratory technician  . Financial resource strain: Not on file  . Food insecurity:    Worry: Not on file    Inability: Not on file  . Transportation needs:    Medical: Not on file    Non-medical: Not on file  Tobacco Use  . Smoking status: Former Research scientist (life sciences)  . Smokeless tobacco: Never Used  Substance and Sexual Activity  . Alcohol use: Yes    Alcohol/week: 0.0 standard drinks    Comment: 01/30/2014 "maybe a beer twice/yr"  . Drug use: No  . Sexual activity: Not Currently  Lifestyle  . Physical activity:    Days per week: Not on file    Minutes per session: Not on file  . Stress: Not on file  Relationships  . Social connections:    Talks on phone: Not on file    Gets together: Not on file    Attends religious service: Not on file    Active member of club or organization: Not on file    Attends meetings of clubs or organizations: Not on file    Relationship status: Not on file  . Intimate partner violence:    Fear of current or ex partner: Not on file    Emotionally abused: Not on file    Physically abused: Not on file    Forced sexual activity: Not on file  Other Topics Concern  . Not on file  Social History Narrative   Exercise: 2-3 times a wk      Patient drinks 2 cups of caffeine daily.   Patient is right handed.    Family History  Problem Relation Age of Onset  . Cancer Father        Lung     Review of Systems  Constitutional: Negative.  Negative for chills and fever.  HENT: Negative.  Negative for sore throat.   Eyes:       Right-sided vision  affected by droopy eyelid.  Respiratory: Negative.  Negative for cough and shortness of breath.   Cardiovascular: Negative.  Negative for chest pain and palpitations.  Gastrointestinal: Negative.  Negative for abdominal pain, diarrhea, nausea and vomiting.  Genitourinary: Negative.  Negative for dysuria.  Musculoskeletal: Negative.  Negative for back pain, myalgias and neck pain.  Skin: Negative.  Negative for rash.  Neurological: Negative.  Negative for dizziness and headaches.  Endo/Heme/Allergies: Negative.   All other systems reviewed and are negative.    Vitals:   04/26/18 0950  BP: 139/77  Pulse: (!) 112  Resp: 16  Temp: 98.6 F (37 C)  SpO2: 98%     Physical Exam  Constitutional: He is oriented to person, place, and time. He appears well-developed and well-nourished.  HENT:  Head: Normocephalic.  Nose: Nose normal.  Mouth/Throat: Oropharynx is clear and moist.  Hold right-sided scalp surgical scar  Eyes: Pupils are equal, round, and reactive to light. Conjunctivae are normal.  Neck: Normal range of motion. Neck supple. No thyromegaly present.  Cardiovascular: Normal rate, regular rhythm and normal heart sounds.  Repeat heart rate: 80 and regular  Pulmonary/Chest: Effort normal and breath sounds normal.  Abdominal: Soft. There is no tenderness.  Musculoskeletal: Normal range of motion.  Lymphadenopathy:    He has no cervical adenopathy.  Neurological: He is alert and oriented to person, place, and time. No sensory deficit. He exhibits normal muscle tone. Coordination normal.  Droopy right eyelid unable to wrinkle forehead on the right side secondary to surgical procedure.  Skin: Skin is warm and dry. Capillary refill takes less than 2 seconds.  Psychiatric: He has a normal mood and affect. His behavior is normal.  Vitals reviewed.   A total of 25 minutes was spent in the room with the patient, greater than 50% of which was in counseling/coordination of care  regarding hypertension, treatment, medications, and need for follow-up.  ASSESSMENT & PLAN:  Essential hypertension Well-controlled.  Will continue present medications and follow-up in 6 months.  Leor was seen today for hypertension.  Diagnoses and all orders for this visit:  Essential hypertension  Hyperlipidemia, unspecified hyperlipidemia type    Patient Instructions       If you have lab work done today you will be contacted with your lab results within the next 2 weeks.  If you have not heard from [place] then please contact us. The fastest way to get your results is to register for My Chart.   IF you received an x-ray today, you will receive an invoice from [facility]. Please contact [facility] at [phone] with questions or concerns regarding your invoice.   IF you received labwork today, you will receive an invoice from [facility]. Please contact [facility] at [phone] with questions or concerns regarding your invoice.   Our billing staff will not be able to assist you with questions regarding bills from these companies.  You will be contacted with the lab results as soon as they are available. The fastest way to get your results is to activate your My Chart account. Instructions are located on the last page of this paperwork. If you have not heard from [place] regarding the results in 2 weeks, please contact this office.     Hypertension Hypertension, commonly called high blood pressure, is when the force of blood pumping through the arteries is too strong. The arteries are the blood vessels that carry blood from the heart throughout the body. Hypertension forces the heart to work harder to pump blood and may cause arteries to become narrow or stiff. Having untreated or uncontrolled hypertension can cause heart attacks, strokes, kidney disease, and other problems. A blood pressure reading consists of a higher number over a lower number. Ideally, your blood  pressure should be below 120/80. The first ("top") number is called the systolic pressure. It is a measure of the pressure in your arteries as your heart beats. The second ("bottom") number is called the diastolic pressure. It is a measure of the pressure in your arteries as the heart relaxes. What are the causes? The cause of  this condition is not known. What increases the risk? Some risk factors for high blood pressure are under your control. Others are not. Factors you can change  Smoking.  Having type 2 diabetes mellitus, high cholesterol, or both.  Not getting enough exercise or physical activity.  Being overweight.  Having too much fat, sugar, calories, or salt (sodium) in your diet.  Drinking too much alcohol. Factors that are difficult or impossible to change  Having chronic kidney disease.  Having a family history of high blood pressure.  Age. Risk increases with age.  Race. You may be at higher risk if you are African-American.  Gender. Men are at higher risk than women before age 43. After age 61, women are at higher risk than men.  Having obstructive sleep apnea.  Stress. What are the signs or symptoms? Extremely high blood pressure (hypertensive crisis) may cause:  Headache.  Anxiety.  Shortness of breath.  Nosebleed.  Nausea and vomiting.  Severe chest pain.  Jerky movements you cannot control (seizures).  How is this diagnosed? This condition is diagnosed by measuring your blood pressure while you are seated, with your arm resting on a surface. The cuff of the blood pressure monitor will be placed directly against the skin of your upper arm at the level of your heart. It should be measured at least twice using the same arm. Certain conditions can cause a difference in blood pressure between your right and left arms. Certain factors can cause blood pressure readings to be lower or higher than normal (elevated) for a short period of time:  When your  blood pressure is higher when you are in a health care provider's office than when you are at home, this is called white coat hypertension. Most people with this condition do not need medicines.  When your blood pressure is higher at home than when you are in a health care provider's office, this is called masked hypertension. Most people with this condition may need medicines to control blood pressure.  If you have a high blood pressure reading during one visit or you have normal blood pressure with other risk factors:  You may be asked to return on a different day to have your blood pressure checked again.  You may be asked to monitor your blood pressure at home for 1 week or longer.  If you are diagnosed with hypertension, you may have other blood or imaging tests to help your health care provider understand your overall risk for other conditions. How is this treated? This condition is treated by making healthy lifestyle changes, such as eating healthy foods, exercising more, and reducing your alcohol intake. Your health care provider may prescribe medicine if lifestyle changes are not enough to get your blood pressure under control, and if:  Your systolic blood pressure is above 130.  Your diastolic blood pressure is above 80.  Your personal target blood pressure may vary depending on your medical conditions, your age, and other factors. Follow these instructions at home: Eating and drinking  Eat a diet that is high in fiber and potassium, and low in sodium, added sugar, and fat. An example eating plan is called the DASH (Dietary Approaches to Stop Hypertension) diet. To eat this way: ? Eat plenty of fresh fruits and vegetables. Try to fill half of your plate at each meal with fruits and vegetables. ? Eat whole grains, such as whole wheat pasta, brown rice, or whole grain bread. Fill about one quarter of your  plate with whole grains. ? Eat or drink low-fat dairy products, such as skim  milk or low-fat yogurt. ? Avoid fatty cuts of meat, processed or cured meats, and poultry with skin. Fill about one quarter of your plate with lean proteins, such as fish, chicken without skin, beans, eggs, and tofu. ? Avoid premade and processed foods. These tend to be higher in sodium, added sugar, and fat.  Reduce your daily sodium intake. Most people with hypertension should eat less than 1,500 mg of sodium a day.  Limit alcohol intake to no more than 1 drink a day for nonpregnant women and 2 drinks a day for men. One drink equals 12 oz of beer, 5 oz of wine, or 1 oz of hard liquor. Lifestyle  Work with your health care provider to maintain a healthy body weight or to lose weight. Ask what an ideal weight is for you.  Get at least 30 minutes of exercise that causes your heart to beat faster (aerobic exercise) most days of the week. Activities may include walking, swimming, or biking.  Include exercise to strengthen your muscles (resistance exercise), such as pilates or lifting weights, as part of your weekly exercise routine. Try to do these types of exercises for 30 minutes at least 3 days a week.  Do not use any products that contain nicotine or tobacco, such as cigarettes and e-cigarettes. If you need help quitting, ask your health care provider.  Monitor your blood pressure at home as told by your health care provider.  Keep all follow-up visits as told by your health care provider. This is important. Medicines  Take over-the-counter and prescription medicines only as told by your health care provider. Follow directions carefully. Blood pressure medicines must be taken as prescribed.  Do not skip doses of blood pressure medicine. Doing this puts you at risk for problems and can make the medicine less effective.  Ask your health care provider about side effects or reactions to medicines that you should watch for. Contact a health care provider if:  You think you are having a  reaction to a medicine you are taking.  You have headaches that keep coming back (recurring).  You feel dizzy.  You have swelling in your ankles.  You have trouble with your vision. Get help right away if:  You develop a severe headache or confusion.  You have unusual weakness or numbness.  You feel faint.  You have severe pain in your chest or abdomen.  You vomit repeatedly.  You have trouble breathing. Summary  Hypertension is when the force of blood pumping through your arteries is too strong. If this condition is not controlled, it may put you at risk for serious complications.  Your personal target blood pressure may vary depending on your medical conditions, your age, and other factors. For most people, a normal blood pressure is less than 120/80.  Hypertension is treated with lifestyle changes, medicines, or a combination of both. Lifestyle changes include weight loss, eating a healthy, low-sodium diet, exercising more, and limiting alcohol. This information is not intended to replace advice given to you by your health care provider. Make sure you discuss any questions you have with your health care provider. Document Released: 05/31/2005 Document Revised: 04/28/2016 Document Reviewed: 04/28/2016 Elsevier Interactive Patient Education  2018 [employer]     Agustina Caroli, MD [facility]

## 2018-04-26 NOTE — Assessment & Plan Note (Signed)
Well-controlled.  Will continue present medications and follow-up in 6 months.

## 2018-04-26 NOTE — Patient Instructions (Addendum)
   If you have lab work done today you will be contacted with your lab results within the next 2 weeks.  If you have not heard from us then please contact us. The fastest way to get your results is to register for My Chart.   IF you received an x-ray today, you will receive an invoice from [facility]. Please contact [facility] at [phone] with questions or concerns regarding your invoice.   IF you received labwork today, you will receive an invoice from [facility]. Please contact [facility] at [phone] with questions or concerns regarding your invoice.   Our billing staff will not be able to assist you with questions regarding bills from these companies.  You will be contacted with the lab results as soon as they are available. The fastest way to get your results is to activate your My Chart account. Instructions are located on the last page of this paperwork. If you have not heard from us regarding the results in 2 weeks, please contact this office.     Hypertension Hypertension, commonly called high blood pressure, is when the force of blood pumping through the arteries is too strong. The arteries are the blood vessels that carry blood from the heart throughout the body. Hypertension forces the heart to work harder to pump blood and may cause arteries to become narrow or stiff. Having untreated or uncontrolled hypertension can cause heart attacks, strokes, kidney disease, and other problems. A blood pressure reading consists of a higher number over a lower number. Ideally, your blood pressure should be below 120/80. The first ("top") number is called the systolic pressure. It is a measure of the pressure in your arteries as your heart beats. The second ("bottom") number is called the diastolic pressure. It is a measure of the pressure in your arteries as the heart relaxes. What are the causes? The cause of this condition is not known. What increases the risk? Some  risk factors for high blood pressure are under your control. Others are not. Factors you can change  Smoking.  Having type 2 diabetes mellitus, high cholesterol, or both.  Not getting enough exercise or physical activity.  Being overweight.  Having too much fat, sugar, calories, or salt (sodium) in your diet.  Drinking too much alcohol. Factors that are difficult or impossible to change  Having chronic kidney disease.  Having a family history of high blood pressure.  Age. Risk increases with age.  Race. You may be at higher risk if you are African-American.  Gender. Men are at higher risk than women before age 45. After age 65, women are at higher risk than men.  Having obstructive sleep apnea.  Stress. What are the signs or symptoms? Extremely high blood pressure (hypertensive crisis) may cause:  Headache.  Anxiety.  Shortness of breath.  Nosebleed.  Nausea and vomiting.  Severe chest pain.  Jerky movements you cannot control (seizures).  How is this diagnosed? This condition is diagnosed by measuring your blood pressure while you are seated, with your arm resting on a surface. The cuff of the blood pressure monitor will be placed directly against the skin of your upper arm at the level of your heart. It should be measured at least twice using the same arm. Certain conditions can cause a difference in blood pressure between your right and left arms. Certain factors can cause blood pressure readings to be lower or higher than normal (elevated) for a short period of time:  When   your blood pressure is higher when you are in a health care provider's office than when you are at home, this is called white coat hypertension. Most people with this condition do not need medicines.  When your blood pressure is higher at home than when you are in a health care provider's office, this is called masked hypertension. Most people with this condition may need medicines to control  blood pressure.  If you have a high blood pressure reading during one visit or you have normal blood pressure with other risk factors:  You may be asked to return on a different day to have your blood pressure checked again.  You may be asked to monitor your blood pressure at home for 1 week or longer.  If you are diagnosed with hypertension, you may have other blood or imaging tests to help your health care provider understand your overall risk for other conditions. How is this treated? This condition is treated by making healthy lifestyle changes, such as eating healthy foods, exercising more, and reducing your alcohol intake. Your health care provider may prescribe medicine if lifestyle changes are not enough to get your blood pressure under control, and if:  Your systolic blood pressure is above 130.  Your diastolic blood pressure is above 80.  Your personal target blood pressure may vary depending on your medical conditions, your age, and other factors. Follow these instructions at home: Eating and drinking  Eat a diet that is high in fiber and potassium, and low in sodium, added sugar, and fat. An example eating plan is called the DASH (Dietary Approaches to Stop Hypertension) diet. To eat this way: ? Eat plenty of fresh fruits and vegetables. Try to fill half of your plate at each meal with fruits and vegetables. ? Eat whole grains, such as whole wheat pasta, brown rice, or whole grain bread. Fill about one quarter of your plate with whole grains. ? Eat or drink low-fat dairy products, such as skim milk or low-fat yogurt. ? Avoid fatty cuts of meat, processed or cured meats, and poultry with skin. Fill about one quarter of your plate with lean proteins, such as fish, chicken without skin, beans, eggs, and tofu. ? Avoid premade and processed foods. These tend to be higher in sodium, added sugar, and fat.  Reduce your daily sodium intake. Most people with hypertension should eat less  than 1,500 mg of sodium a day.  Limit alcohol intake to no more than 1 drink a day for nonpregnant women and 2 drinks a day for men. One drink equals 12 oz of beer, 5 oz of wine, or 1 oz of hard liquor. Lifestyle  Work with your health care provider to maintain a healthy body weight or to lose weight. Ask what an ideal weight is for you.  Get at least 30 minutes of exercise that causes your heart to beat faster (aerobic exercise) most days of the week. Activities may include walking, swimming, or biking.  Include exercise to strengthen your muscles (resistance exercise), such as pilates or lifting weights, as part of your weekly exercise routine. Try to do these types of exercises for 30 minutes at least 3 days a week.  Do not use any products that contain nicotine or tobacco, such as cigarettes and e-cigarettes. If you need help quitting, ask your health care provider.  Monitor your blood pressure at home as told by your health care provider.  Keep all follow-up visits as told by your health care provider.   This is important. Medicines  Take over-the-counter and prescription medicines only as told by your health care provider. Follow directions carefully. Blood pressure medicines must be taken as prescribed.  Do not skip doses of blood pressure medicine. Doing this puts you at risk for problems and can make the medicine less effective.  Ask your health care provider about side effects or reactions to medicines that you should watch for. Contact a health care provider if:  You think you are having a reaction to a medicine you are taking.  You have headaches that keep coming back (recurring).  You feel dizzy.  You have swelling in your ankles.  You have trouble with your vision. Get help right away if:  You develop a severe headache or confusion.  You have unusual weakness or numbness.  You feel faint.  You have severe pain in your chest or abdomen.  You vomit  repeatedly.  You have trouble breathing. Summary  Hypertension is when the force of blood pumping through your arteries is too strong. If this condition is not controlled, it may put you at risk for serious complications.  Your personal target blood pressure may vary depending on your medical conditions, your age, and other factors. For most people, a normal blood pressure is less than 120/80.  Hypertension is treated with lifestyle changes, medicines, or a combination of both. Lifestyle changes include weight loss, eating a healthy, low-sodium diet, exercising more, and limiting alcohol. This information is not intended to replace advice given to you by your health care provider. Make sure you discuss any questions you have with your health care provider. Document Released: 05/31/2005 Document Revised: 04/28/2016 Document Reviewed: 04/28/2016 [employer] Interactive Patient Education  2018 [employer]  

## 2018-04-27 ENCOUNTER — Ambulatory Visit (HOSPITAL_BASED_OUTPATIENT_CLINIC_OR_DEPARTMENT_OTHER): Admit: 2018-04-27 | Payer: Medicare Other | Admitting: Plastic Surgery

## 2018-04-27 HISTORY — DX: Brow ptosis, unspecified: H57.819

## 2018-04-27 SURGERY — BLEPHAROPLASTY
Anesthesia: General | Laterality: Bilateral

## 2018-05-22 ENCOUNTER — Ambulatory Visit: Payer: Medicare Other | Admitting: Internal Medicine

## 2018-08-01 ENCOUNTER — Telehealth: Payer: Self-pay | Admitting: Emergency Medicine

## 2018-08-01 NOTE — Telephone Encounter (Signed)
Appointment for pt is 5/14 and that was relayed to pt. NOT 5/13. Pt knows.

## 2018-08-01 NOTE — Telephone Encounter (Signed)
Called and spoke with pt regarding their appt with Dr. Mitchel Honour on 5/13. Dr Mitchel Honour is out of the office that day. I was able to get pt rescheduled for 5/13 with Dr. Mitchel Honour. I advised of time and late policy. Pt acknowledged.

## 2018-10-25 ENCOUNTER — Ambulatory Visit: Payer: Medicare Other | Admitting: Emergency Medicine

## 2018-10-26 ENCOUNTER — Ambulatory Visit: Payer: Medicare Other | Admitting: Emergency Medicine

## 2018-10-27 ENCOUNTER — Other Ambulatory Visit: Payer: Self-pay | Admitting: *Deleted

## 2018-10-27 DIAGNOSIS — E785 Hyperlipidemia, unspecified: Secondary | ICD-10-CM

## 2018-10-27 DIAGNOSIS — I10 Essential (primary) hypertension: Secondary | ICD-10-CM

## 2018-10-30 ENCOUNTER — Telehealth: Payer: Self-pay | Admitting: *Deleted

## 2018-10-30 ENCOUNTER — Ambulatory Visit (INDEPENDENT_AMBULATORY_CARE_PROVIDER_SITE_OTHER): Payer: Medicare Other | Admitting: Emergency Medicine

## 2018-10-30 ENCOUNTER — Other Ambulatory Visit: Payer: Self-pay

## 2018-10-30 ENCOUNTER — Encounter: Payer: Self-pay | Admitting: Emergency Medicine

## 2018-10-30 VITALS — BP 134/68 | HR 118 | Temp 98.1°F | Resp 18 | Ht 70.0 in | Wt 168.0 lb

## 2018-10-30 DIAGNOSIS — E785 Hyperlipidemia, unspecified: Secondary | ICD-10-CM | POA: Diagnosis not present

## 2018-10-30 DIAGNOSIS — I10 Essential (primary) hypertension: Secondary | ICD-10-CM | POA: Diagnosis not present

## 2018-10-30 LAB — CBC WITH DIFFERENTIAL/PLATELET
Basophils Absolute: 0 10*3/uL (ref 0.0–0.2)
Basos: 0 %
EOS (ABSOLUTE): 0 10*3/uL (ref 0.0–0.4)
Eos: 0 %
Hematocrit: 43.8 % (ref 37.5–51.0)
Hemoglobin: 15 g/dL (ref 13.0–17.7)
Immature Grans (Abs): 0 10*3/uL (ref 0.0–0.1)
Immature Granulocytes: 0 %
Lymphocytes Absolute: 1.1 10*3/uL (ref 0.7–3.1)
Lymphs: 15 %
MCH: 31.6 pg (ref 26.6–33.0)
MCHC: 34.2 g/dL (ref 31.5–35.7)
MCV: 92 fL (ref 79–97)
Monocytes Absolute: 0.5 10*3/uL (ref 0.1–0.9)
Monocytes: 8 %
Neutrophils Absolute: 5.6 10*3/uL (ref 1.4–7.0)
Neutrophils: 77 %
Platelets: 207 10*3/uL (ref 150–450)
RBC: 4.75 x10E6/uL (ref 4.14–5.80)
RDW: 13.3 % (ref 11.6–15.4)
WBC: 7.2 10*3/uL (ref 3.4–10.8)

## 2018-10-30 LAB — LIPID PANEL
Chol/HDL Ratio: 2.5 ratio (ref 0.0–5.0)
Cholesterol, Total: 135 mg/dL (ref 100–199)
HDL: 55 mg/dL (ref >39)
LDL Calculated: 56 mg/dL (ref 0–99)
Triglycerides: 118 mg/dL (ref 0–149)
VLDL Cholesterol Cal: 24 mg/dL (ref 5–40)

## 2018-10-30 LAB — COMPREHENSIVE METABOLIC PANEL
ALT: 17 IU/L (ref 0–44)
AST: 19 IU/L (ref 0–40)
Albumin/Globulin Ratio: 1.9 (ref 1.2–2.2)
Albumin: 4.9 g/dL — ABNORMAL HIGH (ref 3.7–4.7)
Alkaline Phosphatase: 80 IU/L (ref 39–117)
BUN/Creatinine Ratio: 10 (ref 10–24)
BUN: 12 mg/dL (ref 8–27)
Bilirubin Total: 0.8 mg/dL (ref 0.0–1.2)
CO2: 24 mmol/L (ref 20–29)
Calcium: 9.6 mg/dL (ref 8.6–10.2)
Chloride: 99 mmol/L (ref 96–106)
Creatinine, Ser: 1.16 mg/dL (ref 0.76–1.27)
GFR calc Af Amer: 72 mL/min/{1.73_m2} (ref >59)
GFR calc non Af Amer: 62 mL/min/{1.73_m2} (ref >59)
Globulin, Total: 2.6 g/dL (ref 1.5–4.5)
Glucose: 130 mg/dL — ABNORMAL HIGH (ref 65–99)
Potassium: 4.3 mmol/L (ref 3.5–5.2)
Sodium: 136 mmol/L (ref 134–144)
Total Protein: 7.5 g/dL (ref 6.0–8.5)

## 2018-10-30 MED ORDER — HYDROCHLOROTHIAZIDE 12.5 MG PO CAPS: 12.5000 mg | ORAL_CAPSULE | Freq: Every day | ORAL | 3 refills | Status: DC

## 2018-10-30 MED ORDER — AMLODIPINE BESYLATE 10 MG PO TABS: 10.0000 mg | ORAL_TABLET | Freq: Every day | ORAL | 3 refills | Status: DC

## 2018-10-30 MED ORDER — LISINOPRIL 40 MG PO TABS: 40.0000 mg | ORAL_TABLET | Freq: Every day | ORAL | 3 refills | Status: DC

## 2018-10-30 MED ORDER — ATORVASTATIN CALCIUM 40 MG PO TABS: 40.0000 mg | ORAL_TABLET | Freq: Every day | ORAL | 3 refills | Status: DC

## 2018-10-30 NOTE — Assessment & Plan Note (Signed)
Well-controlled.  Continue present medications. 

## 2018-10-30 NOTE — Progress Notes (Signed)
SAAGAR TORTORELLA 73 y.o.   Chief Complaint  Patient presents with  . Hypertension    6 month follow up on HTN  . Medication Refill    ALL MEDICATIONS   BP Readings from Last 3 Encounters:  10/30/18 134/68  04/26/18 139/77  11/17/17 (!) 157/77    HISTORY OF PRESENT ILLNESS: This is a 73 y.o. male with history of hypertension here for follow-up and medication refills.  Feeling well.  Has no complaints or medical concerns today.  HPI   Prior to Admission medications   Medication Sig Start Date End Date Taking? Authorizing Provider  amLODipine (NORVASC) 10 MG tablet Take 1 tablet (10 mg total) by mouth daily. 11/11/17  Yes Sharlet Notaro, Ines Bloomer, MD  atorvastatin (LIPITOR) 40 MG tablet Take 1 tablet (40 mg total) by mouth daily. 11/11/17  Yes Swati Granberry, Ines Bloomer, MD  hydrochlorothiazide (MICROZIDE) 12.5 MG capsule Take 1 capsule (12.5 mg total) by mouth daily. 11/11/17  Yes Osha Errico, Ines Bloomer, MD  lisinopril (PRINIVIL,ZESTRIL) 40 MG tablet Take 1 tablet (40 mg total) by mouth daily. 11/11/17  Yes Horald Pollen, MD    Allergies  Allergen Reactions  . Other Rash    Needs Hypoallergenic Sheets/ Laundry Cart had questionable allergic reaction to detergent last time he was here.    Patient Active Problem List   Diagnosis Date Noted  . 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 Altered mental status 09/05/2017  . 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 History of cranioplasty 03/07/2015  . Seizures ([place]) 11/27/2014  . Abnormal LFTs 11/27/2014  . UTI (urinary tract infection), bacterial 11/27/2014  . 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 Left-sided sensory deficit present 11/19/2014  . Sebaceous cyst x2 01/04/2014  . Prostate cancer ([place]) 12/12/2012  . Essential hypertension 10/21/2011  . Hyperlipidemia 10/21/2011  . Adenomatous colon polyp 10/21/2011  . Postop check 12/29/2010    Past Medical History:  Diagnosis Date  . Basal  cell carcinoma of face X 2  . Brow ptosis   . GERD (gastroesophageal reflux disease)   . History of kidney stones   . Hyperlipidemia   . Hypertension   . Prostate cancer ([place]) 1999  . Seizures ([place]) 11/2014   brain trauma -hematoma  . Small bowel obstruction due to adhesions ([place]) 01/30/2014  . Subdural hematoma [facility])     Past Surgical History:  Procedure Laterality Date  . APPLICATION OF A-CELL OF HEAD/NECK N/A 08/29/2017   Procedure: APPLICATION OF A-CELL;  Surgeon: Wallace Going, DO;  Location: [place];  Service: Plastics;  Laterality: N/A;  . BASAL CELL CARCINOMA EXCISION  X 2   "off my face both times"  . BRAIN SURGERY    . COLECTOMY  10/2010   "took out 8 inches of colon; wasn't cancer"  . CRANIOTOMY Right 11/11/2014   Procedure: CRANIOTOMY HEMATOMA EVACUATION SUBDURAL;  Surgeon: Leeroy Cha, MD;  Location: [place] [facility];  Service: Neurosurgery;  Laterality: Right;  . CRANIOTOMY Right 11/13/2014   Procedure: Removal bone flap, insertion into the abdominal wall;  Surgeon: Leeroy Cha, MD;  Location: [place] [facility];  Service: Neurosurgery;  Laterality: Right;  . CRANIOTOMY N/A 03/07/2015   Procedure: Cranioplasty using Bone flap in abdomen;  Surgeon: Leeroy Cha, MD;  Location: [place] [facility];  Service: Neurosurgery;  Laterality: N/A;  Cranioplasty using Bone flap in abdomen  . CRANIOTOMY N/A 08/29/2017   Procedure: CRANIECTOMY AND DEBRIDEMENT;  Surgeon: Jovita Gamma, MD;  Location: [place] [facility];  Service: Neurosurgery;  Laterality: N/A;  CRANIECTOMY AND DEBRIDEMENT  . EXCISION MASS NECK Right 04/14/2016   Procedure: EXCISION BIOPSY OF RIGHT NECK LYMPH NODE;  Surgeon: Jerrell Belfast, MD;  Location: [place];  Service: ENT;  Laterality: Right;  . EXCISION MELANOMA WITH SENTINEL LYMPH NODE BIOPSY Right 04/14/2016   Procedure: WIDE EXCISION MELANOMA RIGHT SHOULDER WITH SENTINEL  NODE MAPPING;  Surgeon: Autumn Messing III, MD;  Location: [place];  Service: General;  Laterality: Right;  .  PROSTATECTOMY  05/20/1998  . SCALP LACERATION REPAIR N/A 08/29/2017   Procedure: SCALP FLAP TO COVER DEFECT;  Surgeon: Wallace Going, DO;  Location: [place];  Service: Plastics;  Laterality: N/A;    Social History   Socioeconomic History  . Marital status: Divorced    Spouse name: Not on file  . Number of children: 2  . Years of education: 26  . Highest education level: Not on file  Occupational History  . Occupation: retired  Scientific laboratory technician  . Financial resource strain: Not on file  . Food insecurity:    Worry: Not on file    Inability: Not on file  . Transportation needs:    Medical: Not on file    Non-medical: Not on file  Tobacco Use  . Smoking status: Former Research scientist (life sciences)  . Smokeless tobacco: Never Used  Substance and Sexual Activity  . Alcohol use: Yes    Alcohol/week: 0.0 standard drinks    Comment: 01/30/2014 "maybe a beer twice/yr"  . Drug use: No  . Sexual activity: Not Currently  Lifestyle  . Physical activity:    Days per week: Not on file    Minutes per session: Not on file  . Stress: Not on file  Relationships  . Social connections:    Talks on phone: Not on file    Gets together: Not on file    Attends religious service: Not on file    Active member of club or organization: Not on file    Attends meetings of clubs or organizations: Not on file    Relationship status: Not on file  . Intimate partner violence:    Fear of current or ex partner: Not on file    Emotionally abused: Not on file    Physically abused: Not on file    Forced sexual activity: Not on file  Other Topics Concern  . Not on file  Social History Narrative   Exercise: 2-3 times a wk      Patient drinks 2 cups of caffeine daily.   Patient is right handed.    Family History  Problem Relation Age of Onset  . Cancer Father        Lung     Review of Systems  Constitutional: Negative.  Negative for chills, fever and malaise/fatigue.  HENT: Negative.  Negative for congestion,  nosebleeds and sore throat.   Eyes: Negative.  Negative for blurred vision and double vision.  Respiratory: Negative.  Negative for cough, shortness of breath and wheezing.   Cardiovascular: Negative.  Negative for chest pain, palpitations and leg swelling.  Gastrointestinal: Negative.  Negative for abdominal pain, blood in stool, diarrhea, melena, nausea and vomiting.  Genitourinary: Negative.  Negative for dysuria and hematuria.  Musculoskeletal: Negative.  Negative for myalgias.  Skin: Negative.  Negative for rash.  Neurological: Negative.  Negative for dizziness and headaches.  Endo/Heme/Allergies: Negative.   All other systems reviewed and are negative.  Vitals:   10/30/18 1000  BP: 134/68  Pulse: (!) 118  Resp: 18  Temp: 98.1 F (36.7 C)  SpO2: 99%     Physical Exam Vitals signs reviewed.  Constitutional:      Appearance: Normal appearance.  HENT:     Head: Normocephalic.  Neck:     Musculoskeletal: Normal range of motion and neck supple.  Cardiovascular:     Rate and Rhythm: Normal rate and regular rhythm.     Pulses: Normal pulses.     Heart sounds: Normal heart sounds.     Comments: Repeat heart rate: 80 and regular. Pulmonary:     Effort: Pulmonary effort is normal.     Breath sounds: Normal breath sounds.  Musculoskeletal: Normal range of motion.  Skin:    General: Skin is warm and dry.     Capillary Refill: Capillary refill takes less than 2 seconds.  Neurological:     General: No focal deficit present.     Mental Status: He is alert and oriented to person, place, and time.      ASSESSMENT & PLAN: Gervis was seen today for hypertension and medication refill.  Diagnoses and all orders for this visit:  Essential hypertension -     amLODipine (NORVASC) 10 MG tablet; Take 1 tablet (10 mg total) by mouth daily. -     hydrochlorothiazide (MICROZIDE) 12.5 MG capsule; Take 1 capsule (12.5 mg total) by mouth daily. -     lisinopril (ZESTRIL) 40 MG tablet;  Take 1 tablet (40 mg total) by mouth daily. -     Comprehensive metabolic panel -     CBC with Differential/Platelet  Hyperlipidemia, unspecified hyperlipidemia type -     atorvastatin (LIPITOR) 40 MG tablet; Take 1 tablet (40 mg total) by mouth daily. -     Lipid panel -     Comprehensive metabolic panel    Patient Instructions       If you have lab work done today you will be contacted with your lab results within the next 2 weeks.  If you have not heard from [place] then please contact us. The fastest way to get your results is to register for My Chart.   IF you received an x-ray today, you will receive an invoice from [facility]. Please contact [facility] at [phone] with questions or concerns regarding your invoice.   IF you received labwork today, you will receive an invoice from [facility]. Please contact [facility] at [phone] with questions or concerns regarding your invoice.   Our billing staff will not be able to assist you with questions regarding bills from these companies.  You will be contacted with the lab results as soon as they are available. The fastest way to get your results is to activate your My Chart account. Instructions are located on the last page of this paperwork. If you have not heard from [place] regarding the results in 2 weeks, please contact this office.     Hypertension Hypertension, commonly called high blood pressure, is when the force of blood pumping through the arteries is too strong. The arteries are the blood vessels that carry blood from the heart throughout the body. Hypertension forces the heart to work harder to pump blood and may cause arteries to become narrow or stiff. Having untreated or uncontrolled hypertension can cause heart attacks, strokes, kidney disease, and other problems. A blood pressure reading consists of a higher number over a lower number. Ideally, your blood pressure should be below 120/80. The first  ("  top") number is called the systolic pressure. It is a measure of the pressure in your arteries as your heart beats. The second ("bottom") number is called the diastolic pressure. It is a measure of the pressure in your arteries as the heart relaxes. What are the causes? The cause of this condition is not known. What increases the risk? Some risk factors for high blood pressure are under your control. Others are not. Factors you can change  Smoking.  Having type 2 diabetes mellitus, high cholesterol, or both.  Not getting enough exercise or physical activity.  Being overweight.  Having too much fat, sugar, calories, or salt (sodium) in your diet.  Drinking too much alcohol. Factors that are difficult or impossible to change  Having chronic kidney disease.  Having a family history of high blood pressure.  Age. Risk increases with age.  Race. You may be at higher risk if you are African-American.  Gender. Men are at higher risk than women before age 76. After age 75, women are at higher risk than men.  Having obstructive sleep apnea.  Stress. What are the signs or symptoms? Extremely high blood pressure (hypertensive crisis) may cause:  Headache.  Anxiety.  Shortness of breath.  Nosebleed.  Nausea and vomiting.  Severe chest pain.  Jerky movements you cannot control (seizures). How is this diagnosed? This condition is diagnosed by measuring your blood pressure while you are seated, with your arm resting on a surface. The cuff of the blood pressure monitor will be placed directly against the skin of your upper arm at the level of your heart. It should be measured at least twice using the same arm. Certain conditions can cause a difference in blood pressure between your right and left arms. Certain factors can cause blood pressure readings to be lower or higher than normal (elevated) for a short period of time:  When your blood pressure is higher when you are in a  health care provider's office than when you are at home, this is called white coat hypertension. Most people with this condition do not need medicines.  When your blood pressure is higher at home than when you are in a health care provider's office, this is called masked hypertension. Most people with this condition may need medicines to control blood pressure. If you have a high blood pressure reading during one visit or you have normal blood pressure with other risk factors:  You may be asked to return on a different day to have your blood pressure checked again.  You may be asked to monitor your blood pressure at home for 1 week or longer. If you are diagnosed with hypertension, you may have other blood or imaging tests to help your health care provider understand your overall risk for other conditions. How is this treated? This condition is treated by making healthy lifestyle changes, such as eating healthy foods, exercising more, and reducing your alcohol intake. Your health care provider may prescribe medicine if lifestyle changes are not enough to get your blood pressure under control, and if:  Your systolic blood pressure is above 130.  Your diastolic blood pressure is above 80. Your personal target blood pressure may vary depending on your medical conditions, your age, and other factors. Follow these instructions at home: Eating and drinking   Eat a diet that is high in fiber and potassium, and low in sodium, added sugar, and fat. An example eating plan is called the DASH (Dietary Approaches to Stop Hypertension)  diet. To eat this way: ? Eat plenty of fresh fruits and vegetables. Try to fill half of your plate at each meal with fruits and vegetables. ? Eat whole grains, such as whole wheat pasta, brown rice, or whole grain bread. Fill about one quarter of your plate with whole grains. ? Eat or drink low-fat dairy products, such as skim milk or low-fat yogurt. ? Avoid fatty cuts of  meat, processed or cured meats, and poultry with skin. Fill about one quarter of your plate with lean proteins, such as fish, chicken without skin, beans, eggs, and tofu. ? Avoid premade and processed foods. These tend to be higher in sodium, added sugar, and fat.  Reduce your daily sodium intake. Most people with hypertension should eat less than 1,500 mg of sodium a day.  Limit alcohol intake to no more than 1 drink a day for nonpregnant women and 2 drinks a day for men. One drink equals 12 oz of beer, 5 oz of wine, or 1 oz of hard liquor. Lifestyle   Work with your health care provider to maintain a healthy body weight or to lose weight. Ask what an ideal weight is for you.  Get at least 30 minutes of exercise that causes your heart to beat faster (aerobic exercise) most days of the week. Activities may include walking, swimming, or biking.  Include exercise to strengthen your muscles (resistance exercise), such as pilates or lifting weights, as part of your weekly exercise routine. Try to do these types of exercises for 30 minutes at least 3 days a week.  Do not use any products that contain nicotine or tobacco, such as cigarettes and e-cigarettes. If you need help quitting, ask your health care provider.  Monitor your blood pressure at home as told by your health care provider.  Keep all follow-up visits as told by your health care provider. This is important. Medicines  Take over-the-counter and prescription medicines only as told by your health care provider. Follow directions carefully. Blood pressure medicines must be taken as prescribed.  Do not skip doses of blood pressure medicine. Doing this puts you at risk for problems and can make the medicine less effective.  Ask your health care provider about side effects or reactions to medicines that you should watch for. Contact a health care provider if:  You think you are having a reaction to a medicine you are taking.  You  have headaches that keep coming back (recurring).  You feel dizzy.  You have swelling in your ankles.  You have trouble with your vision. Get help right away if:  You develop a severe headache or confusion.  You have unusual weakness or numbness.  You feel faint.  You have severe pain in your chest or abdomen.  You vomit repeatedly.  You have trouble breathing. Summary  Hypertension is when the force of blood pumping through your arteries is too strong. If this condition is not controlled, it may put you at risk for serious complications.  Your personal target blood pressure may vary depending on your medical conditions, your age, and other factors. For most people, a normal blood pressure is less than 120/80.  Hypertension is treated with lifestyle changes, medicines, or a combination of both. Lifestyle changes include weight loss, eating a healthy, low-sodium diet, exercising more, and limiting alcohol. This information is not intended to replace advice given to you by your health care provider. Make sure you discuss any questions you have with your health  care provider. Document Released: 05/31/2005 Document Revised: 04/28/2016 Document Reviewed: 04/28/2016 Elsevier Interactive Patient Education  2019 [employer]      Agustina Caroli, MD [facility]

## 2018-10-30 NOTE — Telephone Encounter (Signed)
Called patient to triage for 10:00 am appointment. Left message in voice mail at home number to call back to be triaged.

## 2018-10-30 NOTE — Patient Instructions (Addendum)
   If you have lab work done today you will be contacted with your lab results within the next 2 weeks.  If you have not heard from us then please contact us. The fastest way to get your results is to register for My Chart.   IF you received an x-ray today, you will receive an invoice from [facility]. Please contact [facility] at [phone] with questions or concerns regarding your invoice.   IF you received labwork today, you will receive an invoice from [facility]. Please contact [facility] at [phone] with questions or concerns regarding your invoice.   Our billing staff will not be able to assist you with questions regarding bills from these companies.  You will be contacted with the lab results as soon as they are available. The fastest way to get your results is to activate your My Chart account. Instructions are located on the last page of this paperwork. If you have not heard from us regarding the results in 2 weeks, please contact this office.       Hypertension Hypertension, commonly called high blood pressure, is when the force of blood pumping through the arteries is too strong. The arteries are the blood vessels that carry blood from the heart throughout the body. Hypertension forces the heart to work harder to pump blood and may cause arteries to become narrow or stiff. Having untreated or uncontrolled hypertension can cause heart attacks, strokes, kidney disease, and other problems. A blood pressure reading consists of a higher number over a lower number. Ideally, your blood pressure should be below 120/80. The first ("top") number is called the systolic pressure. It is a measure of the pressure in your arteries as your heart beats. The second ("bottom") number is called the diastolic pressure. It is a measure of the pressure in your arteries as the heart relaxes. What are the causes? The cause of this condition is not known. What increases the  risk? Some risk factors for high blood pressure are under your control. Others are not. Factors you can change  Smoking.  Having type 2 diabetes mellitus, high cholesterol, or both.  Not getting enough exercise or physical activity.  Being overweight.  Having too much fat, sugar, calories, or salt (sodium) in your diet.  Drinking too much alcohol. Factors that are difficult or impossible to change  Having chronic kidney disease.  Having a family history of high blood pressure.  Age. Risk increases with age.  Race. You may be at higher risk if you are African-American.  Gender. Men are at higher risk than women before age 45. After age 65, women are at higher risk than men.  Having obstructive sleep apnea.  Stress. What are the signs or symptoms? Extremely high blood pressure (hypertensive crisis) may cause:  Headache.  Anxiety.  Shortness of breath.  Nosebleed.  Nausea and vomiting.  Severe chest pain.  Jerky movements you cannot control (seizures). How is this diagnosed? This condition is diagnosed by measuring your blood pressure while you are seated, with your arm resting on a surface. The cuff of the blood pressure monitor will be placed directly against the skin of your upper arm at the level of your heart. It should be measured at least twice using the same arm. Certain conditions can cause a difference in blood pressure between your right and left arms. Certain factors can cause blood pressure readings to be lower or higher than normal (elevated) for a short period of time:    When your blood pressure is higher when you are in a health care provider's office than when you are at home, this is called white coat hypertension. Most people with this condition do not need medicines.  When your blood pressure is higher at home than when you are in a health care provider's office, this is called masked hypertension. Most people with this condition may need medicines  to control blood pressure. If you have a high blood pressure reading during one visit or you have normal blood pressure with other risk factors:  You may be asked to return on a different day to have your blood pressure checked again.  You may be asked to monitor your blood pressure at home for 1 week or longer. If you are diagnosed with hypertension, you may have other blood or imaging tests to help your health care provider understand your overall risk for other conditions. How is this treated? This condition is treated by making healthy lifestyle changes, such as eating healthy foods, exercising more, and reducing your alcohol intake. Your health care provider may prescribe medicine if lifestyle changes are not enough to get your blood pressure under control, and if:  Your systolic blood pressure is above 130.  Your diastolic blood pressure is above 80. Your personal target blood pressure may vary depending on your medical conditions, your age, and other factors. Follow these instructions at home: Eating and drinking   Eat a diet that is high in fiber and potassium, and low in sodium, added sugar, and fat. An example eating plan is called the DASH (Dietary Approaches to Stop Hypertension) diet. To eat this way: ? Eat plenty of fresh fruits and vegetables. Try to fill half of your plate at each meal with fruits and vegetables. ? Eat whole grains, such as whole wheat pasta, brown rice, or whole grain bread. Fill about one quarter of your plate with whole grains. ? Eat or drink low-fat dairy products, such as skim milk or low-fat yogurt. ? Avoid fatty cuts of meat, processed or cured meats, and poultry with skin. Fill about one quarter of your plate with lean proteins, such as fish, chicken without skin, beans, eggs, and tofu. ? Avoid premade and processed foods. These tend to be higher in sodium, added sugar, and fat.  Reduce your daily sodium intake. Most people with hypertension should  eat less than 1,500 mg of sodium a day.  Limit alcohol intake to no more than 1 drink a day for nonpregnant women and 2 drinks a day for men. One drink equals 12 oz of beer, 5 oz of wine, or 1 oz of hard liquor. Lifestyle   Work with your health care provider to maintain a healthy body weight or to lose weight. Ask what an ideal weight is for you.  Get at least 30 minutes of exercise that causes your heart to beat faster (aerobic exercise) most days of the week. Activities may include walking, swimming, or biking.  Include exercise to strengthen your muscles (resistance exercise), such as pilates or lifting weights, as part of your weekly exercise routine. Try to do these types of exercises for 30 minutes at least 3 days a week.  Do not use any products that contain nicotine or tobacco, such as cigarettes and e-cigarettes. If you need help quitting, ask your health care provider.  Monitor your blood pressure at home as told by your health care provider.  Keep all follow-up visits as told by your health care provider.   This is important. Medicines  Take over-the-counter and prescription medicines only as told by your health care provider. Follow directions carefully. Blood pressure medicines must be taken as prescribed.  Do not skip doses of blood pressure medicine. Doing this puts you at risk for problems and can make the medicine less effective.  Ask your health care provider about side effects or reactions to medicines that you should watch for. Contact a health care provider if:  You think you are having a reaction to a medicine you are taking.  You have headaches that keep coming back (recurring).  You feel dizzy.  You have swelling in your ankles.  You have trouble with your vision. Get help right away if:  You develop a severe headache or confusion.  You have unusual weakness or numbness.  You feel faint.  You have severe pain in your chest or abdomen.  You vomit  repeatedly.  You have trouble breathing. Summary  Hypertension is when the force of blood pumping through your arteries is too strong. If this condition is not controlled, it may put you at risk for serious complications.  Your personal target blood pressure may vary depending on your medical conditions, your age, and other factors. For most people, a normal blood pressure is less than 120/80.  Hypertension is treated with lifestyle changes, medicines, or a combination of both. Lifestyle changes include weight loss, eating a healthy, low-sodium diet, exercising more, and limiting alcohol. This information is not intended to replace advice given to you by your health care provider. Make sure you discuss any questions you have with your health care provider. Document Released: 05/31/2005 Document Revised: 04/28/2016 Document Reviewed: 04/28/2016 Elsevier Interactive Patient Education  2019 Elsevier Inc.  

## 2018-11-01 ENCOUNTER — Encounter: Payer: Self-pay | Admitting: *Deleted

## 2018-11-01 NOTE — Progress Notes (Signed)
Letter sent.

## 2019-04-12 ENCOUNTER — Ambulatory Visit (INDEPENDENT_AMBULATORY_CARE_PROVIDER_SITE_OTHER): Payer: Medicare Other | Admitting: Emergency Medicine

## 2019-04-12 VITALS — BP 134/68 | Ht 70.0 in | Wt 168.0 lb

## 2019-04-12 DIAGNOSIS — Z0000 Encounter for general adult medical examination without abnormal findings: Secondary | ICD-10-CM

## 2019-04-12 NOTE — Progress Notes (Addendum)
Presents today for [employer] Visit   Date of last exam:  10/30/2018    Interpreter used for this visit? No  I connected with  Martin Edwards on 04/12/19 by a telephone  application and verified that I am speaking with the correct person using two identifiers.   I discussed the limitations of evaluation and management by telemedicine. The patient expressed understanding and agreed to proceed.   Patient Care Team: Horald Pollen, MD as PCP - General (Internal Medicine) Jovita Gamma, MD as Consulting Physician (Neurosurgery)   Other items to address today:   Discussed immunizations Patient states never get flu-shot Discussed Eye/Dental Follow up scheduled Dr. Mitchel Honour  04-30-2019 @ 10;00 am    Other Screening: Last lipid screening: 10/30/2018  ADVANCE DIRECTIVES: Discussed: yes On File: no Materials Provided: yes (mailed)  Immunization status:  Immunization History  Administered Date(s) Administered  . Pneumococcal Conjugate-13 06/18/2014  . Pneumococcal-Unspecified 06/14/2010  . Tdap 06/27/2009     Health Maintenance Due  Topic Date Due  . INFLUENZA VACCINE  01/13/2019     Functional Status Survey: Is the patient deaf or have difficulty hearing?: No Does the patient have difficulty seeing, even when wearing glasses/contacts?: No Does the patient have difficulty concentrating, remembering, or making decisions?: No Does the patient have difficulty walking or climbing stairs?: No Does the patient have difficulty dressing or bathing?: No Does the patient have difficulty doing errands alone such as visiting a doctor's office or shopping?: No   6CIT Screen 04/12/2019  What Year? 0 points  What month? 0 points  What time? 0 points  Count back from 20 0 points  Months in reverse 0 points  Repeat phrase 4 points  Total Score 4        Clinical Support from 04/12/2019 in [facility]  AUDIT-C Score  0       Home  Environment:   Lives in one story home Yes scattered rugs grippers No grab bars Adequate lighting/no clutter   Patient Active Problem List   Diagnosis Date Noted  . 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 History of cranioplasty 03/07/2015  . Seizures ([place]) 11/27/2014  . Abnormal LFTs 11/27/2014  . 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 Left-sided sensory deficit present 11/19/2014  . Prostate cancer ([place]) 12/12/2012  . Essential hypertension 10/21/2011  . Hyperlipidemia 10/21/2011  . Adenomatous colon polyp 10/21/2011     Past Medical History:  Diagnosis Date  . Basal cell carcinoma of face X 2  . Brow ptosis   . GERD (gastroesophageal reflux disease)   . History of kidney stones   . Hyperlipidemia   . Hypertension   . Prostate cancer ([place]) 1999  . Seizures ([place]) 11/2014   brain trauma -hematoma  . Small bowel obstruction due to adhesions ([place]) 01/30/2014  . Subdural hematoma [facility])      Past Surgical History:  Procedure Laterality Date  . APPLICATION OF A-CELL OF HEAD/NECK N/A 08/29/2017   Procedure: APPLICATION OF A-CELL;  Surgeon: Wallace Going, DO;  Location: [place];  Service: Plastics;  Laterality: N/A;  . BASAL CELL CARCINOMA EXCISION  X 2   "off my face both times"  . BRAIN SURGERY    . COLECTOMY  10/2010   "took out 8 inches of colon; wasn't cancer"  . CRANIOTOMY Right 11/11/2014   Procedure: CRANIOTOMY HEMATOMA EVACUATION SUBDURAL;  Surgeon: Leeroy Cha, MD;  [facility]  NEURO ORS;  Service: Neurosurgery;  Laterality: Right;  . CRANIOTOMY Right 11/13/2014   Procedure: Removal bone flap, insertion into the abdominal wall;  Surgeon: Leeroy Cha, MD;  Location: [place] NEURO ORS;  Service: Neurosurgery;  Laterality: Right;  . CRANIOTOMY N/A 03/07/2015   Procedure: Cranioplasty using Bone flap in abdomen;  Surgeon: Leeroy Cha, MD;  Location: [place] NEURO ORS;  Service: Neurosurgery;  Laterality: N/A;  Cranioplasty using  Bone flap in abdomen  . CRANIOTOMY N/A 08/29/2017   Procedure: CRANIECTOMY AND DEBRIDEMENT;  Surgeon: Jovita Gamma, MD;  Location: [place];  Service: Neurosurgery;  Laterality: N/A;  CRANIECTOMY AND DEBRIDEMENT  . EXCISION MASS NECK Right 04/14/2016   Procedure: EXCISION BIOPSY OF RIGHT NECK LYMPH NODE;  Surgeon: Jerrell Belfast, MD;  Location: [place];  Service: ENT;  Laterality: Right;  . EXCISION MELANOMA WITH SENTINEL LYMPH NODE BIOPSY Right 04/14/2016   Procedure: WIDE EXCISION MELANOMA RIGHT SHOULDER WITH SENTINEL  NODE MAPPING;  Surgeon: Autumn Messing III, MD;  Location: [place];  Service: General;  Laterality: Right;  . PROSTATECTOMY  05/20/1998  . SCALP LACERATION REPAIR N/A 08/29/2017   Procedure: SCALP FLAP TO COVER DEFECT;  Surgeon: Wallace Going, DO;  Location: [place];  Service: Plastics;  Laterality: N/A;     Family History  Problem Relation Age of Onset  . Cancer Father        Lung     Social History   Socioeconomic History  . Marital status: Divorced    Spouse name: Not on file  . Number of children: 2  . Years of education: 2  . Highest education level: Not on file  Occupational History  . Occupation: retired  Scientific laboratory technician  . Financial resource strain: Not on file  . Food insecurity    Worry: Not on file    Inability: Not on file  . Transportation needs    Medical: Not on file    Non-medical: Not on file  Tobacco Use  . Smoking status: Former Research scientist (life sciences)  . Smokeless tobacco: Never Used  Substance and Sexual Activity  . Alcohol use: Yes    Alcohol/week: 0.0 standard drinks    Comment: 01/30/2014 "maybe a beer twice/yr"  . Drug use: No  . Sexual activity: Not Currently  Lifestyle  . Physical activity    Days per week: Not on file    Minutes per session: Not on file  . Stress: Not on file  Relationships  . Social Herbalist on phone: Not on file    Gets together: Not on file    Attends religious service: Not on file    Active member of club or  organization: Not on file    Attends meetings of clubs or organizations: Not on file    Relationship status: Not on file  . Intimate partner violence    Fear of current or ex partner: Not on file    Emotionally abused: Not on file    Physically abused: Not on file    Forced sexual activity: Not on file  Other Topics Concern  . Not on file  Social History Narrative   Exercise: 2-3 times a wk      Patient drinks 2 cups of caffeine daily.   Patient is right handed.     Allergies  Allergen Reactions  . Other Rash    Needs Hypoallergenic Sheets/ Laundry Cart had questionable allergic reaction to detergent last time he was here.     Prior  to Admission medications   Medication Sig Start Date End Date Taking? Authorizing Provider  amLODipine (NORVASC) 10 MG tablet Take 1 tablet (10 mg total) by mouth daily. 10/30/18  Yes Sagardia, Ines Bloomer, MD  atorvastatin (LIPITOR) 40 MG tablet Take 1 tablet (40 mg total) by mouth daily. 10/30/18  Yes Sagardia, Ines Bloomer, MD  hydrochlorothiazide (MICROZIDE) 12.5 MG capsule Take 1 capsule (12.5 mg total) by mouth daily. 10/30/18  Yes Sagardia, Ines Bloomer, MD  lisinopril (ZESTRIL) 40 MG tablet Take 1 tablet (40 mg total) by mouth daily. 10/30/18  Yes Horald Pollen, MD     Depression screen [facility] 2/9 04/12/2019 10/30/2018 11/17/2017 11/11/2017 10/11/2017  Decreased Interest 0 0 0 0 0  Down, Depressed, Hopeless 0 0 0 0 0  PHQ - 2 Score 0 0 0 0 0     Fall Risk  04/12/2019 10/30/2018 04/26/2018 11/17/2017 11/11/2017  Falls in the past year? 0 0 0 No No  Number falls in past yr: 0 - - - -  Injury with Fall? 0 - - - -  Follow up Falls evaluation completed;Education provided;Falls prevention discussed Falls evaluation completed - - -      PHYSICAL EXAM: BP 134/68 Comment: taken from previous visit  Ht 5\' 10"  (1.778 m)   Wt 168 lb (76.2 kg)   BMI 24.11 kg/m    Wt Readings from Last 3 Encounters:  04/12/19 168 lb (76.2 kg)  10/30/18 168 lb  (76.2 kg)  04/26/18 167 lb 3.2 oz (75.8 kg)    Medicare annual wellness visit, subsequent    Education/Counseling provided regarding diet and exercise, prevention of chronic diseases, smoking/tobacco cessation, if applicable, and reviewed "Covered Medicare Preventive Services."

## 2019-04-12 NOTE — Patient Instructions (Addendum)
Thank you for taking time to come for your Medicare Wellness Visit. I appreciate your ongoing commitment to your health goals. Please review the following plan we discussed and let me know if I can assist you in the future.  Martin Kennedy LPN  Preventive Care 74 Years and Older, Male Preventive care refers to lifestyle choices and visits with your health care provider that can promote health and wellness. This includes:  A yearly physical exam. This is also called an annual well check.  Regular dental and eye exams.  Immunizations.  Screening for certain conditions.  Healthy lifestyle choices, such as diet and exercise. What can I expect for my preventive care visit? Physical exam Your health care provider will check:  Height and weight. These may be used to calculate body mass index (BMI), which is a measurement that tells if you are at a healthy weight.  Heart rate and blood pressure.  Your skin for abnormal spots. Counseling Your health care provider may ask you questions about:  Alcohol, tobacco, and drug use.  Emotional well-being.  Home and relationship well-being.  Sexual activity.  Eating habits.  History of falls.  Memory and ability to understand (cognition).  Work and work Statistician. What immunizations do I need?  Influenza (flu) vaccine  This is recommended every year. Tetanus, diphtheria, and pertussis (Tdap) vaccine  You may need a Td booster every 10 years. Varicella (chickenpox) vaccine  You may need this vaccine if you have not already been vaccinated. Zoster (shingles) vaccine  You may need this after age 66. Pneumococcal conjugate (PCV13) vaccine  One dose is recommended after age 84. Pneumococcal polysaccharide (PPSV23) vaccine  One dose is recommended after age 88. Measles, mumps, and rubella (MMR) vaccine  You may need at least one dose of MMR if you were born in 1957 or later. You may also need a second dose. Meningococcal  conjugate (MenACWY) vaccine  You may need this if you have certain conditions. Hepatitis A vaccine  You may need this if you have certain conditions or if you travel or work in places where you may be exposed to hepatitis A. Hepatitis B vaccine  You may need this if you have certain conditions or if you travel or work in places where you may be exposed to hepatitis B. Haemophilus influenzae type b (Hib) vaccine  You may need this if you have certain conditions. You may receive vaccines as individual doses or as more than one vaccine together in one shot (combination vaccines). Talk with your health care provider about the risks and benefits of combination vaccines. What tests do I need? Blood tests  Lipid and cholesterol levels. These may be checked every 5 years, or more frequently depending on your overall health.  Hepatitis C test.  Hepatitis B test. Screening  Lung cancer screening. You may have this screening every year starting at age 24 if you have a 30-pack-year history of smoking and currently smoke or have quit within the past 15 years.  Colorectal cancer screening. All adults should have this screening starting at age 52 and continuing until age 61. Your health care provider may recommend screening at age 57 if you are at increased risk. You will have tests every 1-10 years, depending on your results and the type of screening test.  Prostate cancer screening. Recommendations will vary depending on your family history and other risks.  Diabetes screening. This is done by checking your blood sugar (glucose) after you have not eaten for  a while (fasting). You may have this done every 1-3 years.  Abdominal aortic aneurysm (AAA) screening. You may need this if you are a current or former smoker.  Sexually transmitted disease (STD) testing. Follow these instructions at home: Eating and drinking  Eat a diet that includes fresh fruits and vegetables, whole grains, lean  protein, and low-fat dairy products. Limit your intake of foods with high amounts of sugar, saturated fats, and salt.  Take vitamin and mineral supplements as recommended by your health care provider.  Do not drink alcohol if your health care provider tells you not to drink.  If you drink alcohol: ? Limit how much you have to 0-2 drinks a day. ? Be aware of how much alcohol is in your drink. In the [place] one drink equals one 12 oz bottle of beer (355 mL), one 5 oz glass of wine (148 mL), or one 1 oz glass of hard liquor (44 mL). Lifestyle  Take daily care of your teeth and gums.  Stay active. Exercise for at least 30 minutes on 5 or more days each week.  Do not use any products that contain nicotine or tobacco, such as cigarettes, e-cigarettes, and chewing tobacco. If you need help quitting, ask your health care provider.  If you are sexually active, practice safe sex. Use a condom or other form of protection to prevent STIs (sexually transmitted infections).  Talk with your health care provider about taking a low-dose aspirin or statin. What's next?  Visit your health care provider once a year for a well check visit.  Ask your health care provider how often you should have your eyes and teeth checked.  Stay up to date on all vaccines. This information is not intended to replace advice given to you by your health care provider. Make sure you discuss any questions you have with your health care provider. Document Released: 06/27/2015 Document Revised: 05/25/2018 Document Reviewed: 05/25/2018 Elsevier Patient Education  2020 [employer].

## 2019-04-18 DIAGNOSIS — L821 Other seborrheic keratosis: Secondary | ICD-10-CM | POA: Diagnosis not present

## 2019-04-18 DIAGNOSIS — L905 Scar conditions and fibrosis of skin: Secondary | ICD-10-CM | POA: Diagnosis not present

## 2019-04-18 DIAGNOSIS — Z85828 Personal history of other malignant neoplasm of skin: Secondary | ICD-10-CM | POA: Diagnosis not present

## 2019-04-18 DIAGNOSIS — Z85820 Personal history of malignant melanoma of skin: Secondary | ICD-10-CM | POA: Diagnosis not present

## 2019-04-18 DIAGNOSIS — L570 Actinic keratosis: Secondary | ICD-10-CM | POA: Diagnosis not present

## 2019-04-18 DIAGNOSIS — D485 Neoplasm of uncertain behavior of skin: Secondary | ICD-10-CM | POA: Diagnosis not present

## 2019-04-30 ENCOUNTER — Other Ambulatory Visit: Payer: Self-pay

## 2019-04-30 ENCOUNTER — Ambulatory Visit (INDEPENDENT_AMBULATORY_CARE_PROVIDER_SITE_OTHER): Payer: Medicare Other | Admitting: Emergency Medicine

## 2019-04-30 ENCOUNTER — Encounter: Payer: Self-pay | Admitting: Emergency Medicine

## 2019-04-30 VITALS — BP 147/74 | HR 92 | Temp 98.1°F | Resp 16 | Ht 70.0 in | Wt 159.2 lb

## 2019-04-30 DIAGNOSIS — I10 Essential (primary) hypertension: Secondary | ICD-10-CM

## 2019-04-30 DIAGNOSIS — E785 Hyperlipidemia, unspecified: Secondary | ICD-10-CM | POA: Diagnosis not present

## 2019-04-30 LAB — GLUCOSE, POCT (MANUAL RESULT ENTRY): POC Glucose: 107 mg/dl — AB (ref 70–99)

## 2019-04-30 MED ORDER — ATORVASTATIN CALCIUM 40 MG PO TABS: 40.0000 mg | ORAL_TABLET | Freq: Every day | ORAL | 3 refills | Status: DC

## 2019-04-30 MED ORDER — AMLODIPINE BESYLATE 10 MG PO TABS: 10.0000 mg | ORAL_TABLET | Freq: Every day | ORAL | 3 refills | Status: DC

## 2019-04-30 MED ORDER — HYDROCHLOROTHIAZIDE 12.5 MG PO CAPS: 12.5000 mg | ORAL_CAPSULE | Freq: Every day | ORAL | 3 refills | Status: DC

## 2019-04-30 MED ORDER — LISINOPRIL 40 MG PO TABS: 40.0000 mg | ORAL_TABLET | Freq: Every day | ORAL | 3 refills | Status: DC

## 2019-04-30 NOTE — Patient Instructions (Addendum)
   If you have lab work done today you will be contacted with your lab results within the next 2 weeks.  If you have not heard from us then please contact us. The fastest way to get your results is to register for My Chart.   IF you received an x-ray today, you will receive an invoice from [facility]. Please contact [facility] at [phone] with questions or concerns regarding your invoice.   IF you received labwork today, you will receive an invoice from [facility]. Please contact [facility] at [phone] with questions or concerns regarding your invoice.   Our billing staff will not be able to assist you with questions regarding bills from these companies.  You will be contacted with the lab results as soon as they are available. The fastest way to get your results is to activate your My Chart account. Instructions are located on the last page of this paperwork. If you have not heard from us regarding the results in 2 weeks, please contact this office.     Hypertension, Adult High blood pressure (hypertension) is when the force of blood pumping through the arteries is too strong. The arteries are the blood vessels that carry blood from the heart throughout the body. Hypertension forces the heart to work harder to pump blood and may cause arteries to become narrow or stiff. Untreated or uncontrolled hypertension can cause a heart attack, heart failure, a stroke, kidney disease, and other problems. A blood pressure reading consists of a higher number over a lower number. Ideally, your blood pressure should be below 120/80. The first ("top") number is called the systolic pressure. It is a measure of the pressure in your arteries as your heart beats. The second ("bottom") number is called the diastolic pressure. It is a measure of the pressure in your arteries as the heart relaxes. What are the causes? The exact cause of this condition is not known. There are some conditions  that result in or are related to high blood pressure. What increases the risk? Some risk factors for high blood pressure are under your control. The following factors may make you more likely to develop this condition:  Smoking.  Having type 2 diabetes mellitus, high cholesterol, or both.  Not getting enough exercise or physical activity.  Being overweight.  Having too much fat, sugar, calories, or salt (sodium) in your diet.  Drinking too much alcohol. Some risk factors for high blood pressure may be difficult or impossible to change. Some of these factors include:  Having chronic kidney disease.  Having a family history of high blood pressure.  Age. Risk increases with age.  Race. You may be at higher risk if you are African American.  Gender. Men are at higher risk than women before age 45. After age 65, women are at higher risk than men.  Having obstructive sleep apnea.  Stress. What are the signs or symptoms? High blood pressure may not cause symptoms. Very high blood pressure (hypertensive crisis) may cause:  Headache.  Anxiety.  Shortness of breath.  Nosebleed.  Nausea and vomiting.  Vision changes.  Severe chest pain.  Seizures. How is this diagnosed? This condition is diagnosed by measuring your blood pressure while you are seated, with your arm resting on a flat surface, your legs uncrossed, and your feet flat on the floor. The cuff of the blood pressure monitor will be placed directly against the skin of your upper arm at the level of your heart.   It should be measured at least twice using the same arm. Certain conditions can cause a difference in blood pressure between your right and left arms. Certain factors can cause blood pressure readings to be lower or higher than normal for a short period of time:  When your blood pressure is higher when you are in a health care provider's office than when you are at home, this is called white coat hypertension.  Most people with this condition do not need medicines.  When your blood pressure is higher at home than when you are in a health care provider's office, this is called masked hypertension. Most people with this condition may need medicines to control blood pressure. If you have a high blood pressure reading during one visit or you have normal blood pressure with other risk factors, you may be asked to:  Return on a different day to have your blood pressure checked again.  Monitor your blood pressure at home for 1 week or longer. If you are diagnosed with hypertension, you may have other blood or imaging tests to help your health care provider understand your overall risk for other conditions. How is this treated? This condition is treated by making healthy lifestyle changes, such as eating healthy foods, exercising more, and reducing your alcohol intake. Your health care provider may prescribe medicine if lifestyle changes are not enough to get your blood pressure under control, and if:  Your systolic blood pressure is above 130.  Your diastolic blood pressure is above 80. Your personal target blood pressure may vary depending on your medical conditions, your age, and other factors. Follow these instructions at home: Eating and drinking   Eat a diet that is high in fiber and potassium, and low in sodium, added sugar, and fat. An example eating plan is called the DASH (Dietary Approaches to Stop Hypertension) diet. To eat this way: ? Eat plenty of fresh fruits and vegetables. Try to fill one half of your plate at each meal with fruits and vegetables. ? Eat whole grains, such as whole-wheat pasta, brown rice, or whole-grain bread. Fill about one fourth of your plate with whole grains. ? Eat or drink low-fat dairy products, such as skim milk or low-fat yogurt. ? Avoid fatty cuts of meat, processed or cured meats, and poultry with skin. Fill about one fourth of your plate with lean proteins, such  as fish, chicken without skin, beans, eggs, or tofu. ? Avoid pre-made and processed foods. These tend to be higher in sodium, added sugar, and fat.  Reduce your daily sodium intake. Most people with hypertension should eat less than 1,500 mg of sodium a day.  Do not drink alcohol if: ? Your health care provider tells you not to drink. ? You are pregnant, may be pregnant, or are planning to become pregnant.  If you drink alcohol: ? Limit how much you use to:  0-1 drink a day for women.  0-2 drinks a day for men. ? Be aware of how much alcohol is in your drink. In the [place] one drink equals one 12 oz bottle of beer (355 mL), one 5 oz glass of wine (148 mL), or one 1 oz glass of hard liquor (44 mL). Lifestyle   Work with your health care provider to maintain a healthy body weight or to lose weight. Ask what an ideal weight is for you.  Get at least 30 minutes of exercise most days of the week. Activities may include walking, swimming, or   biking.  Include exercise to strengthen your muscles (resistance exercise), such as Pilates or lifting weights, as part of your weekly exercise routine. Try to do these types of exercises for 30 minutes at least 3 days a week.  Do not use any products that contain nicotine or tobacco, such as cigarettes, e-cigarettes, and chewing tobacco. If you need help quitting, ask your health care provider.  Monitor your blood pressure at home as told by your health care provider.  Keep all follow-up visits as told by your health care provider. This is important. Medicines  Take over-the-counter and prescription medicines only as told by your health care provider. Follow directions carefully. Blood pressure medicines must be taken as prescribed.  Do not skip doses of blood pressure medicine. Doing this puts you at risk for problems and can make the medicine less effective.  Ask your health care provider about side effects or reactions to medicines that you  should watch for. Contact a health care provider if you:  Think you are having a reaction to a medicine you are taking.  Have headaches that keep coming back (recurring).  Feel dizzy.  Have swelling in your ankles.  Have trouble with your vision. Get help right away if you:  Develop a severe headache or confusion.  Have unusual weakness or numbness.  Feel faint.  Have severe pain in your chest or abdomen.  Vomit repeatedly.  Have trouble breathing. Summary  Hypertension is when the force of blood pumping through your arteries is too strong. If this condition is not controlled, it may put you at risk for serious complications.  Your personal target blood pressure may vary depending on your medical conditions, your age, and other factors. For most people, a normal blood pressure is less than 120/80.  Hypertension is treated with lifestyle changes, medicines, or a combination of both. Lifestyle changes include losing weight, eating a healthy, low-sodium diet, exercising more, and limiting alcohol. This information is not intended to replace advice given to you by your health care provider. Make sure you discuss any questions you have with your health care provider. Document Released: 05/31/2005 Document Revised: 02/08/2018 Document Reviewed: 02/08/2018 [employer] Patient Education  2020 [employer]  

## 2019-04-30 NOTE — Progress Notes (Signed)
BP Readings from Last 3 Encounters:  04/30/19 (!) 147/74  04/12/19 134/68  10/30/18 134/68   Lab Results  Component Value Date   CREATININE 1.16 10/30/2018   BUN 12 10/30/2018   NA 136 10/30/2018   K 4.3 10/30/2018   CL 99 10/30/2018   CO2 24 10/30/2018   Lab Results  Component Value Date   CHOL 135 10/30/2018   HDL 55 10/30/2018   LDLCALC 56 10/30/2018   TRIG 118 10/30/2018   CHOLHDL 2.5 10/30/2018   Martin Edwards 73 y.o.   Chief Complaint  Patient presents with  . Hypertension    6 months follow up  . Medication Refill    ALL PEND    HISTORY OF PRESENT ILLNESS: This is a 73 y.o. male with history of hypertension here for follow-up and medication refill. Doing well.  Has no complaints or medical concerns today.  Wt Readings from Last 3 Encounters:  04/30/19 159 lb 3.2 oz (72.2 kg)  04/12/19 168 lb (76.2 kg)  10/30/18 168 lb (76.2 kg)     HPI   Prior to Admission medications   Medication Sig Start Date End Date Taking? Authorizing Provider  amLODipine (NORVASC) 10 MG tablet Take 1 tablet (10 mg total) by mouth daily. 10/30/18  Yes Keaun Schnabel, Ines Bloomer, MD  atorvastatin (LIPITOR) 40 MG tablet Take 1 tablet (40 mg total) by mouth daily. 10/30/18  Yes Ashton Belote, Ines Bloomer, MD  hydrochlorothiazide (MICROZIDE) 12.5 MG capsule Take 1 capsule (12.5 mg total) by mouth daily. 10/30/18  Yes Emeka Lindner, Ines Bloomer, MD  lisinopril (ZESTRIL) 40 MG tablet Take 1 tablet (40 mg total) by mouth daily. 10/30/18  Yes Horald Pollen, MD    Allergies  Allergen Reactions  . Other Rash    Needs Hypoallergenic Sheets/ Laundry Cart had questionable allergic reaction to detergent last time he was here.    Patient Active Problem List   Diagnosis Date Noted  . 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 History of cranioplasty 03/07/2015  . Seizures ([place]) 11/27/2014  . Abnormal LFTs 11/27/2014  . 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  Left-sided sensory deficit present 11/19/2014  . Prostate cancer ([place]) 12/12/2012  . Essential hypertension 10/21/2011  . Hyperlipidemia 10/21/2011  . Adenomatous colon polyp 10/21/2011    Past Medical History:  Diagnosis Date  . Basal cell carcinoma of face X 2  . Brow ptosis   . GERD (gastroesophageal reflux disease)   . History of kidney stones   . Hyperlipidemia   . Hypertension   . Prostate cancer ([place]) 1999  . Seizures ([place]) 11/2014   brain trauma -hematoma  . Small bowel obstruction due to adhesions ([place]) 01/30/2014  . Subdural hematoma [facility])     Past Surgical History:  Procedure Laterality Date  . APPLICATION OF A-CELL OF HEAD/NECK N/A 08/29/2017   Procedure: APPLICATION OF A-CELL;  Surgeon: Wallace Going, DO;  Location: [place];  Service: Plastics;  Laterality: N/A;  . BASAL CELL CARCINOMA EXCISION  X 2   "off my face both times"  . BRAIN SURGERY    . COLECTOMY  10/2010   "took out 8 inches of colon; wasn't cancer"  . CRANIOTOMY Right 11/11/2014   Procedure: CRANIOTOMY HEMATOMA EVACUATION SUBDURAL;  Surgeon: Leeroy Cha, MD;  Location: [place] NEUR[facility]S;  Service: Neurosurgery;  Laterality: Right;  . CRANIOTOMY Right 11/13/2014   Procedure: Removal bone flap, insertion into the abdominal wall;  Surgeon: Leeroy Cha,  MD;  Location: [place] [facility]UR[facility] ORS;  Service: Neurosurgery;  Laterality: Right;  . CRANIOTOMY N/A 03/07/2015   Procedure: Cranioplasty using Bone flap in abdomen;  Surgeon: Leeroy Cha, MD;  Location: [place] NEUR[facility] ORS;  Service: Neurosurgery;  Laterality: N/A;  Cranioplasty using Bone flap in abdomen  . CRANIOTOMY N/A 08/29/2017   Procedure: CRANIECTOMY AND DEBRIDEMENT;  Surgeon: Jovita Gamma, MD;  Location: [place];  Service: Neurosurgery;  Laterality: N/A;  CRANIECTOMY AND DEBRIDEMENT  . EXCISION MASS NECK Right 04/14/2016   Procedure: EXCISION BIOPSY OF RIGHT NECK LYMPH NODE;  Surgeon: Jerrell Belfast, MD;  Location: [place];  Service: ENT;  Laterality: Right;  .  EXCISION MELANOMA WITH SENTINEL LYMPH NODE BIOPSY Right 04/14/2016   Procedure: WIDE EXCISION MELANOMA RIGHT SHOULDER WITH SENTINEL  NODE MAPPING;  Surgeon: Autumn Messing III, MD;  Location: [place];  Service: General;  Laterality: Right;  . PROSTATECTOMY  05/20/1998  . SCALP LACERATION REPAIR N/A 08/29/2017   Procedure: SCALP FLAP TO COVER DEFECT;  Surgeon: Wallace Going, DO;  Location: [place];  Service: Plastics;  Laterality: N/A;    Social History   Socioeconomic History  . Marital status: Divorced    Spouse name: Not on file  . Number of children: 2  . Years of education: 59  . Highest education level: Not on file  Occupational History  . Occupation: retired  Scientific laboratory technician  . Financial resource strain: Not on file  . Food insecurity    Worry: Not on file    Inability: Not on file  . Transportation needs    Medical: Not on file    Non-medical: Not on file  Tobacco Use  . Smoking status: Former Research scientist (life sciences)  . Smokeless tobacco: Never Used  Substance and Sexual Activity  . Alcohol use: Yes    Alcohol/week: 0.0 standard drinks    Comment: 01/30/2014 "maybe a beer twice/yr"  . Drug use: No  . Sexual activity: Not Currently  Lifestyle  . Physical activity    Days per week: Not on file    Minutes per session: Not on file  . Stress: Not on file  Relationships  . Social Herbalist on phone: Not on file    Gets together: Not on file    Attends religious service: Not on file    Active member of club or organization: Not on file    Attends meetings of clubs or organizations: Not on file    Relationship status: Not on file  . Intimate partner violence    Fear of current or ex partner: Not on file    Emotionally abused: Not on file    Physically abused: Not on file    Forced sexual activity: Not on file  Other Topics Concern  . Not on file  Social History Narrative   Exercise: 2-3 times a wk      Patient drinks 2 cups of caffeine daily.   Patient is right handed.     Family History  Problem Relation Age of Onset  . Cancer Father        Lung     Review of Systems  Constitutional: Negative.  Negative for chills and fever.  HENT: Negative.  Negative for congestion and sore throat.   Respiratory: Negative.  Negative for cough and shortness of breath.   Cardiovascular: Negative.  Negative for chest pain and palpitations.  Gastrointestinal: Negative.  Negative for abdominal pain, diarrhea, nausea and vomiting.  Genitourinary: Negative.  Negative  for dysuria and hematuria.  Musculoskeletal: Negative.  Negative for myalgias.  Skin: Negative.  Negative for rash.  Neurological: Negative for dizziness and headaches.  All other systems reviewed and are negative.   Vitals:   04/30/19 1032  BP: (!) 147/74  Pulse: 92  Resp: 16  Temp: 98.1 F (36.7 C)  SpO2: 97%    Physical Exam Vitals signs reviewed.  Constitutional:      Appearance: Normal appearance.  HENT:     Head: Normocephalic.  Eyes:     Extraocular Movements: Extraocular movements intact.     Conjunctiva/sclera: Conjunctivae normal.     Pupils: Pupils are equal, round, and reactive to light.  Cardiovascular:     Rate and Rhythm: Normal rate and regular rhythm.     Heart sounds: Normal heart sounds.  Pulmonary:     Effort: Pulmonary effort is normal.     Breath sounds: Normal breath sounds.  Abdominal:     Palpations: Abdomen is soft.     Tenderness: There is no abdominal tenderness.  Musculoskeletal: Normal range of motion.     Right lower leg: No edema.     Left lower leg: No edema.  Skin:    General: Skin is warm and dry.     Capillary Refill: Capillary refill takes less than 2 seconds.  Neurological:     General: No focal deficit present.     Mental Status: He is alert and oriented to person, place, and time.  Psychiatric:        Mood and Affect: Mood normal.        Behavior: Behavior normal.      ASSESSMENT & PLAN: Jaelon was seen today for hypertension and  medication refill.  Diagnoses and all orders for this visit:  Essential hypertension -     amLODipine (NORVASC) 10 MG tablet; Take 1 tablet (10 mg total) by mouth daily. -     hydrochlorothiazide (MICROZIDE) 12.5 MG capsule; Take 1 capsule (12.5 mg total) by mouth daily. -     lisinopril (ZESTRIL) 40 MG tablet; Take 1 tablet (40 mg total) by mouth daily. -     Comprehensive metabolic panel  Hyperlipidemia, unspecified hyperlipidemia type -     atorvastatin (LIPITOR) 40 MG tablet; Take 1 tablet (40 mg total) by mouth daily. -     Lipid panel -     Comprehensive metabolic panel -     POCT glucose (manual entry)   Clinically stable.  No medical concerns identified during this visit.  Continue present medication.  No changes.  Follow-up in 6 months.   Patient Instructions       If you have lab work done today you will be contacted with your lab results within the next 2 weeks.  If you have not heard from [place] then please contact us. The fastest way to get your results is to register for My Chart.   IF you received an x-ray today, you will receive an invoice from [facility]. Please contact [facility] at [phone] with questions or concerns regarding your invoice.   IF you received labwork today, you will receive an invoice from [facility]. Please contact [facility] at [phone] with questions or concerns regarding your invoice.   Our billing staff will not be able to assist you with questions regarding bills from these companies.  You will be contacted with the lab results as soon as they are available. The fastest way to get your results is to activate your My  Chart account. Instructions are located on the last page of this paperwork. If you have not heard from [place] regarding the results in 2 weeks, please contact this office.     Hypertension, Adult High blood pressure (hypertension) is when the force of blood pumping through the arteries is too strong. The  arteries are the blood vessels that carry blood from the heart throughout the body. Hypertension forces the heart to work harder to pump blood and may cause arteries to become narrow or stiff. Untreated or uncontrolled hypertension can cause a heart attack, heart failure, a stroke, kidney disease, and other problems. A blood pressure reading consists of a higher number over a lower number. Ideally, your blood pressure should be below 120/80. The first ("top") number is called the systolic pressure. It is a measure of the pressure in your arteries as your heart beats. The second ("bottom") number is called the diastolic pressure. It is a measure of the pressure in your arteries as the heart relaxes. What are the causes? The exact cause of this condition is not known. There are some conditions that result in or are related to high blood pressure. What increases the risk? Some risk factors for high blood pressure are under your control. The following factors may make you more likely to develop this condition:  Smoking.  Having type 2 diabetes mellitus, high cholesterol, or both.  Not getting enough exercise or physical activity.  Being overweight.  Having too much fat, sugar, calories, or salt (sodium) in your diet.  Drinking too much alcohol. Some risk factors for high blood pressure may be difficult or impossible to change. Some of these factors include:  Having chronic kidney disease.  Having a family history of high blood pressure.  Age. Risk increases with age.  Race. You may be at higher risk if you are African American.  Gender. Men are at higher risk than women before age 22. After age 35, women are at higher risk than men.  Having obstructive sleep apnea.  Stress. What are the signs or symptoms? High blood pressure may not cause symptoms. Very high blood pressure (hypertensive crisis) may cause:  Headache.  Anxiety.  Shortness of breath.  Nosebleed.  Nausea and  vomiting.  Vision changes.  Severe chest pain.  Seizures. How is this diagnosed? This condition is diagnosed by measuring your blood pressure while you are seated, with your arm resting on a flat surface, your legs uncrossed, and your feet flat on the floor. The cuff of the blood pressure monitor will be placed directly against the skin of your upper arm at the level of your heart. It should be measured at least twice using the same arm. Certain conditions can cause a difference in blood pressure between your right and left arms. Certain factors can cause blood pressure readings to be lower or higher than normal for a short period of time:  When your blood pressure is higher when you are in a health care provider's office than when you are at home, this is called white coat hypertension. Most people with this condition do not need medicines.  When your blood pressure is higher at home than when you are in a health care provider's office, this is called masked hypertension. Most people with this condition may need medicines to control blood pressure. If you have a high blood pressure reading during one visit or you have normal blood pressure with other risk factors, you may be asked to:  Return on  a different day to have your blood pressure checked again.  Monitor your blood pressure at home for 1 week or longer. If you are diagnosed with hypertension, you may have other blood or imaging tests to help your health care provider understand your overall risk for other conditions. How is this treated? This condition is treated by making healthy lifestyle changes, such as eating healthy foods, exercising more, and reducing your alcohol intake. Your health care provider may prescribe medicine if lifestyle changes are not enough to get your blood pressure under control, and if:  Your systolic blood pressure is above 130.  Your diastolic blood pressure is above 80. Your personal target blood  pressure may vary depending on your medical conditions, your age, and other factors. Follow these instructions at home: Eating and drinking   Eat a diet that is high in fiber and potassium, and low in sodium, added sugar, and fat. An example eating plan is called the DASH (Dietary Approaches to Stop Hypertension) diet. To eat this way: ? Eat plenty of fresh fruits and vegetables. Try to fill one half of your plate at each meal with fruits and vegetables. ? Eat whole grains, such as whole-wheat pasta, brown rice, or whole-grain bread. Fill about one fourth of your plate with whole grains. ? Eat or drink low-fat dairy products, such as skim milk or low-fat yogurt. ? Avoid fatty cuts of meat, processed or cured meats, and poultry with skin. Fill about one fourth of your plate with lean proteins, such as fish, chicken without skin, beans, eggs, or tofu. ? Avoid pre-made and processed foods. These tend to be higher in sodium, added sugar, and fat.  Reduce your daily sodium intake. Most people with hypertension should eat less than 1,500 mg of sodium a day.  Do not drink alcohol if: ? Your health care provider tells you not to drink. ? You are pregnant, may be pregnant, or are planning to become pregnant.  If you drink alcohol: ? Limit how much you use to:  0-1 drink a day for women.  0-2 drinks a day for men. ? Be aware of how much alcohol is in your drink. In the [place] one drink equals one 12 oz bottle of beer (355 mL), one 5 oz glass of wine (148 mL), or one 1 oz glass of hard liquor (44 mL). Lifestyle   Work with your health care provider to maintain a healthy body weight or to lose weight. Ask what an ideal weight is for you.  Get at least 30 minutes of exercise most days of the week. Activities may include walking, swimming, or biking.  Include exercise to strengthen your muscles (resistance exercise), such as Pilates or lifting weights, as part of your weekly exercise routine. Try  to do these types of exercises for 30 minutes at least 3 days a week.  Do not use any products that contain nicotine or tobacco, such as cigarettes, e-cigarettes, and chewing tobacco. If you need help quitting, ask your health care provider.  Monitor your blood pressure at home as told by your health care provider.  Keep all follow-up visits as told by your health care provider. This is important. Medicines  Take over-the-counter and prescription medicines only as told by your health care provider. Follow directions carefully. Blood pressure medicines must be taken as prescribed.  Do not skip doses of blood pressure medicine. Doing this puts you at risk for problems and can make the medicine less effective.  Ask  your health care provider about side effects or reactions to medicines that you should watch for. Contact a health care provider if you:  Think you are having a reaction to a medicine you are taking.  Have headaches that keep coming back (recurring).  Feel dizzy.  Have swelling in your ankles.  Have trouble with your vision. Get help right away if you:  Develop a severe headache or confusion.  Have unusual weakness or numbness.  Feel faint.  Have severe pain in your chest or abdomen.  Vomit repeatedly.  Have trouble breathing. Summary  Hypertension is when the force of blood pumping through your arteries is too strong. If this condition is not controlled, it may put you at risk for serious complications.  Your personal target blood pressure may vary depending on your medical conditions, your age, and other factors. For most people, a normal blood pressure is less than 120/80.  Hypertension is treated with lifestyle changes, medicines, or a combination of both. Lifestyle changes include losing weight, eating a healthy, low-sodium diet, exercising more, and limiting alcohol. This information is not intended to replace advice given to you by your health care  provider. Make sure you discuss any questions you have with your health care provider. Document Released: 05/31/2005 Document Revised: 02/08/2018 Document Reviewed: 02/08/2018 Elsevier Patient Education  2020 [employer].    Agustina Caroli, MD [facility]

## 2019-05-01 LAB — COMPREHENSIVE METABOLIC PANEL
ALT: 15 IU/L (ref 0–44)
AST: 17 IU/L (ref 0–40)
Albumin/Globulin Ratio: 1.6 (ref 1.2–2.2)
Albumin: 4.6 g/dL (ref 3.7–4.7)
Alkaline Phosphatase: 82 IU/L (ref 39–117)
BUN/Creatinine Ratio: 9 — ABNORMAL LOW (ref 10–24)
BUN: 9 mg/dL (ref 8–27)
Bilirubin Total: 0.8 mg/dL (ref 0.0–1.2)
CO2: 25 mmol/L (ref 20–29)
Calcium: 9.5 mg/dL (ref 8.6–10.2)
Chloride: 99 mmol/L (ref 96–106)
Creatinine, Ser: 0.97 mg/dL (ref 0.76–1.27)
GFR calc Af Amer: 89 mL/min/{1.73_m2} (ref >59)
GFR calc non Af Amer: 77 mL/min/{1.73_m2} (ref >59)
Globulin, Total: 2.8 g/dL (ref 1.5–4.5)
Glucose: 103 mg/dL — ABNORMAL HIGH (ref 65–99)
Potassium: 4.6 mmol/L (ref 3.5–5.2)
Sodium: 139 mmol/L (ref 134–144)
Total Protein: 7.4 g/dL (ref 6.0–8.5)

## 2019-05-01 LAB — LIPID PANEL
Chol/HDL Ratio: 2.4 ratio (ref 0.0–5.0)
Cholesterol, Total: 127 mg/dL (ref 100–199)
HDL: 52 mg/dL (ref >39)
LDL Chol Calc (NIH): 60 mg/dL (ref 0–99)
Triglycerides: 76 mg/dL (ref 0–149)
VLDL Cholesterol Cal: 15 mg/dL (ref 5–40)

## 2019-10-16 DIAGNOSIS — L821 Other seborrheic keratosis: Secondary | ICD-10-CM | POA: Diagnosis not present

## 2019-10-16 DIAGNOSIS — L720 Epidermal cyst: Secondary | ICD-10-CM | POA: Diagnosis not present

## 2019-10-16 DIAGNOSIS — L570 Actinic keratosis: Secondary | ICD-10-CM | POA: Diagnosis not present

## 2019-10-16 DIAGNOSIS — C44622 Squamous cell carcinoma of skin of right upper limb, including shoulder: Secondary | ICD-10-CM | POA: Diagnosis not present

## 2019-10-16 DIAGNOSIS — D1801 Hemangioma of skin and subcutaneous tissue: Secondary | ICD-10-CM | POA: Diagnosis not present

## 2019-10-16 DIAGNOSIS — L905 Scar conditions and fibrosis of skin: Secondary | ICD-10-CM | POA: Diagnosis not present

## 2019-10-16 DIAGNOSIS — L819 Disorder of pigmentation, unspecified: Secondary | ICD-10-CM | POA: Diagnosis not present

## 2019-10-16 DIAGNOSIS — D485 Neoplasm of uncertain behavior of skin: Secondary | ICD-10-CM | POA: Diagnosis not present

## 2019-10-29 ENCOUNTER — Other Ambulatory Visit: Payer: Self-pay

## 2019-10-29 ENCOUNTER — Encounter: Payer: Self-pay | Admitting: Family Medicine

## 2019-10-29 ENCOUNTER — Ambulatory Visit (INDEPENDENT_AMBULATORY_CARE_PROVIDER_SITE_OTHER): Payer: Medicare Other | Admitting: Family Medicine

## 2019-10-29 VITALS — BP 135/72 | HR 60 | Temp 98.5°F | Ht 70.0 in | Wt 156.4 lb

## 2019-10-29 DIAGNOSIS — E785 Hyperlipidemia, unspecified: Secondary | ICD-10-CM

## 2019-10-29 DIAGNOSIS — K219 Gastro-esophageal reflux disease without esophagitis: Secondary | ICD-10-CM

## 2019-10-29 DIAGNOSIS — I10 Essential (primary) hypertension: Secondary | ICD-10-CM

## 2019-10-29 DIAGNOSIS — Z8546 Personal history of malignant neoplasm of prostate: Secondary | ICD-10-CM

## 2019-10-29 MED ORDER — AMLODIPINE BESYLATE 10 MG PO TABS: 10.0000 mg | ORAL_TABLET | Freq: Every day | ORAL | 3 refills | Status: DC

## 2019-10-29 MED ORDER — ATORVASTATIN CALCIUM 40 MG PO TABS: 40.0000 mg | ORAL_TABLET | Freq: Every day | ORAL | 3 refills | Status: DC

## 2019-10-29 MED ORDER — LISINOPRIL 40 MG PO TABS: 40.0000 mg | ORAL_TABLET | Freq: Every day | ORAL | 3 refills | Status: DC

## 2019-10-29 MED ORDER — HYDROCHLOROTHIAZIDE 12.5 MG PO CAPS: 12.5000 mg | ORAL_CAPSULE | Freq: Every day | ORAL | 3 refills | Status: DC

## 2019-10-29 MED ORDER — FAMOTIDINE 20 MG PO TABS: 20.0000 mg | ORAL_TABLET | Freq: Two times a day (BID) | ORAL | 3 refills | Status: DC | PRN

## 2019-10-29 NOTE — Patient Instructions (Signed)
° ° ° °  If you have lab work done today you will be contacted with your lab results within the next 2 weeks.  If you have not heard from us then please contact us. The fastest way to get your results is to register for My Chart. ° ° °IF you received an x-ray today, you will receive an invoice from [facility]. Please contact [facility] at [phone] with questions or concerns regarding your invoice.  ° °IF you received labwork today, you will receive an invoice from [facility]. Please contact [facility] at [phone] with questions or concerns regarding your invoice.  ° °Our billing staff will not be able to assist you with questions regarding bills from these companies. ° °You will be contacted with the lab results as soon as they are available. The fastest way to get your results is to activate your My Chart account. Instructions are located on the last page of this paperwork. If you have not heard from us regarding the results in 2 weeks, please contact this office. °  ° ° ° °

## 2019-10-29 NOTE — Progress Notes (Signed)
5/17/202110:27 AM  Martin Edwards 05/26/1946, 74 y.o., male [id number]  Chief Complaint  Patient presents with  . Hypertension    no px with meds/fasting for labs  . GI Problem    felt like stomach has been on fire, taking tums. Feels better today    HPI:   Patient is a 74 y.o. male with past medical history significant for HTN, HLP, prostate cancer who presents today for routine followup  PCP Dr Mitchel Honour, last OV nov 2020 Total prostectomy for prostate cancer about 21 years ago Does not see urology more Last PSA in 2018, 0.4  Was having abd issues for past 3-4 days after he has mowing the lawn Right upper and epigastric, felt on fire, belching Has been using TUMS - helped Today feeling much better No vomiting, diarrhea, or black tarry stools No sick contacts  Taking BP meds and cholsesterol meds wo issues  BP Readings from Last 3 Encounters:  10/29/19 135/72  04/30/19 (!) 147/74  04/12/19 134/68   Wt Readings from Last 3 Encounters:  10/29/19 156 lb 6.4 oz (70.9 kg)  04/30/19 159 lb 3.2 oz (72.2 kg)  04/12/19 168 lb (76.2 kg)  reports weight loss is common for him during the summer Other than past 3-4 days, he reports good appetite and normal BM  Lab Results  Component Value Date   CHOL 127 04/30/2019   HDL 52 04/30/2019   LDLCALC 60 04/30/2019   TRIG 76 04/30/2019   CHOLHDL 2.4 04/30/2019    Depression screen PHQ 2/9 10/29/2019 04/30/2019 04/12/2019  Decreased Interest 0 0 0  Down, Depressed, Hopeless 0 0 0  PHQ - 2 Score 0 0 0    Fall Risk  10/29/2019 04/30/2019 04/12/2019 10/30/2018 04/26/2018  Falls in the past year? 0 0 0 0 0  Number falls in past yr: 0 - 0 - -  Injury with Fall? 0 - 0 - -  Risk for fall due to : Impaired balance/gait - - - -  Follow up - Falls evaluation completed Falls evaluation completed;Education provided;Falls prevention discussed Falls evaluation completed -     Allergies  Allergen Reactions  . Other Rash    Needs  Hypoallergenic Sheets/ Laundry Cart had questionable allergic reaction to detergent last time he was here.    Prior to Admission medications   Medication Sig Start Date End Date Taking? Authorizing Provider  amLODipine (NORVASC) 10 MG tablet Take 1 tablet (10 mg total) by mouth daily. 04/30/19  Yes Sagardia, Ines Bloomer, MD  atorvastatin (LIPITOR) 40 MG tablet Take 1 tablet (40 mg total) by mouth daily. 04/30/19  Yes Sagardia, Ines Bloomer, MD  hydrochlorothiazide (MICROZIDE) 12.5 MG capsule Take 1 capsule (12.5 mg total) by mouth daily. 04/30/19  Yes Sagardia, Ines Bloomer, MD  lisinopril (ZESTRIL) 40 MG tablet Take 1 tablet (40 mg total) by mouth daily. 04/30/19  Yes Horald Pollen, MD    Past Medical History:  Diagnosis Date  . Basal cell carcinoma of face X 2  . Brow ptosis   . GERD (gastroesophageal reflux disease)   . History of kidney stones   . Hyperlipidemia   . Hypertension   . Prostate cancer ([place]) 1999  . Seizures ([place]) 11/2014   brain trauma -hematoma  . Small bowel obstruction due to adhesions ([place]) 01/30/2014  . Subdural hematoma [facility])     Past Surgical History:  Procedure Laterality Date  . APPLICATION OF A-CELL OF HEAD/NECK N/A 08/29/2017   Procedure: APPLICATION  OF A-CELL;  Surgeon: Wallace Going, DO;  Location: [facility];  Service: Plastics;  Laterality: N/A;  . BASAL CELL CARCINOMA EXCISION  X 2   "off my face both times"  . BRAIN SURGERY    . COLECTOMY  10/2010   "took out 8 inches of colon; wasn't cancer"  . CRANIOTOMY Right 11/11/2014   Procedure: CRANIOTOMY HEMATOMA EVACUATION SUBDURAL;  Surgeon: Leeroy Cha, MD;  Location: [facility] NEURO ORS;  Service: Neurosurgery;  Laterality: Right;  . CRANIOTOMY Right 11/13/2014   Procedure: Removal bone flap, insertion into the abdominal wall;  Surgeon: Leeroy Cha, MD;  Location: [facility] [place]S;  Service: Neurosurgery;  Laterality: Right;  . CRANIOTOMY N/A 03/07/2015   Procedure: Cranioplasty using Bone flap in  abdomen;  Surgeon: Leeroy Cha, MD;  Location: [facility] NEUR[facility] ORS;  Service: Neurosurgery;  Laterality: N/A;  Cranioplasty using Bone flap in abdomen  . CRANIOTOMY N/A 08/29/2017   Procedure: CRANIECTOMY AND DEBRIDEMENT;  Surgeon: Jovita Gamma, MD;  Location: [facility];  Service: Neurosurgery;  Laterality: N/A;  CRANIECTOMY AND DEBRIDEMENT  . EXCISION MASS NECK Right 04/14/2016   Procedure: EXCISION BIOPSY OF RIGHT NECK LYMPH NODE;  Surgeon: Jerrell Belfast, MD;  Location: [facility];  Service: ENT;  Laterality: Right;  . EXCISION MELANOMA WITH SENTINEL LYMPH NODE BIOPSY Right 04/14/2016   Procedure: WIDE EXCISION MELANOMA RIGHT SHOULDER WITH SENTINEL  NODE MAPPING;  Surgeon: Autumn Messing III, MD;  Location: [facility];  Service: General;  Laterality: Right;  . PROSTATECTOMY  05/20/1998  . SCALP LACERATION REPAIR N/A 08/29/2017   Procedure: SCALP FLAP TO COVER DEFECT;  Surgeon: Wallace Going, DO;  Location: [facility];  Service: Plastics;  Laterality: N/A;    Social History   Tobacco Use  . Smoking status: Former Research scientist (life sciences)  . Smokeless tobacco: Never Used  Substance Use Topics  . Alcohol use: Yes    Alcohol/week: 0.0 standard drinks    Comment: 01/30/2014 "maybe a beer twice/yr"    Family History  Problem Relation Age of Onset  . Cancer Father        Lung    Review of Systems  Constitutional: Negative for chills and fever.  Respiratory: Negative for cough and shortness of breath.   Cardiovascular: Negative for chest pain, palpitations and leg swelling.  Genitourinary: Negative for dysuria and hematuria.  per hpi   OBJECTIVE:  Today's Vitals   10/29/19 1003  BP: 135/72  Pulse: 60  Temp: 98.5 F (36.9 C)  SpO2: 98%  Weight: 156 lb 6.4 oz (70.9 kg)  Height: 5' 10" (1.778 m)   Body mass index is 22.44 kg/m.   Physical Exam Vitals and nursing note reviewed.  Constitutional:      Appearance: He is well-developed.  HENT:     Head: Normocephalic and atraumatic.  Eyes:      Conjunctiva/sclera: Conjunctivae normal.     Pupils: Pupils are equal, round, and reactive to light.  Cardiovascular:     Rate and Rhythm: Normal rate and regular rhythm.     Heart sounds: No murmur. No friction rub. No gallop.   Pulmonary:     Effort: Pulmonary effort is normal.     Breath sounds: Normal breath sounds. No wheezing, rhonchi or rales.  Abdominal:     General: Bowel sounds are normal. There is no distension.     Palpations: Abdomen is soft.     Tenderness: There is no abdominal tenderness.     Hernia: No hernia is present.  Musculoskeletal:  Cervical back: Neck supple.     Right lower leg: No edema.     Left lower leg: No edema.  Skin:    General: Skin is warm and dry.  Neurological:     Mental Status: He is alert and oriented to person, place, and time.     No results found for this or any previous visit (from the past 24 hour(s)).  No results found.   ASSESSMENT and PLAN  1. Essential hypertension Controlled. Continue current regime.  - Lipid panel - CMP14+EGFR - amLODipine (NORVASC) 10 MG tablet; Take 1 tablet (10 mg total) by mouth daily. - hydrochlorothiazide (MICROZIDE) 12.5 MG capsule; Take 1 capsule (12.5 mg total) by mouth daily. - lisinopril (ZESTRIL) 40 MG tablet; Take 1 tablet (40 mg total) by mouth daily.  2. Hyperlipidemia, unspecified hyperlipidemia type Checking labs today, medications will be adjusted as needed.  - Lipid panel - CMP14+EGFR - atorvastatin (LIPITOR) 40 MG tablet; Take 1 tablet (40 mg total) by mouth daily.  3. H/O prostate cancer - PSA  4. Gastroesophageal reflux disease without esophagitis Discussed supportive measures, new meds r/se/b and RTC precautions.   Other orders - famotidine (PEPCID) 20 MG tablet; Take 1 tablet (20 mg total) by mouth 2 (two) times daily as needed for heartburn or indigestion.  Return in about 6 months (around 04/30/2020) for Dr Mitchel Honour for HTN and HLP.    Rutherford Guys,  MD [facility] [place], [place] [address] Ph.  [phone] Fax [phone]

## 2019-10-30 LAB — CMP14+EGFR
ALT: 13 IU/L (ref 0–44)
AST: 16 IU/L (ref 0–40)
Albumin/Globulin Ratio: 1.6 (ref 1.2–2.2)
Albumin: 4.9 g/dL — ABNORMAL HIGH (ref 3.7–4.7)
Alkaline Phosphatase: 90 IU/L (ref 48–121)
BUN/Creatinine Ratio: 9 — ABNORMAL LOW (ref 10–24)
BUN: 9 mg/dL (ref 8–27)
Bilirubin Total: 1.3 mg/dL — ABNORMAL HIGH (ref 0.0–1.2)
CO2: 24 mmol/L (ref 20–29)
Calcium: 10.5 mg/dL — ABNORMAL HIGH (ref 8.6–10.2)
Chloride: 94 mmol/L — ABNORMAL LOW (ref 96–106)
Creatinine, Ser: 1.04 mg/dL (ref 0.76–1.27)
GFR calc Af Amer: 81 mL/min/{1.73_m2} (ref >59)
GFR calc non Af Amer: 70 mL/min/{1.73_m2} (ref >59)
Globulin, Total: 3 g/dL (ref 1.5–4.5)
Glucose: 111 mg/dL — ABNORMAL HIGH (ref 65–99)
Potassium: 4.9 mmol/L (ref 3.5–5.2)
Sodium: 135 mmol/L (ref 134–144)
Total Protein: 7.9 g/dL (ref 6.0–8.5)

## 2019-10-30 LAB — LIPID PANEL
Chol/HDL Ratio: 2 ratio (ref 0.0–5.0)
Cholesterol, Total: 142 mg/dL (ref 100–199)
HDL: 71 mg/dL (ref >39)
LDL Chol Calc (NIH): 55 mg/dL (ref 0–99)
Triglycerides: 84 mg/dL (ref 0–149)
VLDL Cholesterol Cal: 16 mg/dL (ref 5–40)

## 2019-10-30 LAB — PSA: Prostate Specific Ag, Serum: 0.4 ng/mL (ref 0.0–4.0)

## 2019-11-05 DIAGNOSIS — L570 Actinic keratosis: Secondary | ICD-10-CM | POA: Diagnosis not present

## 2019-11-05 DIAGNOSIS — L819 Disorder of pigmentation, unspecified: Secondary | ICD-10-CM | POA: Diagnosis not present

## 2019-11-13 ENCOUNTER — Encounter: Payer: Self-pay | Admitting: Radiology

## 2019-12-06 DIAGNOSIS — D0461 Carcinoma in situ of skin of right upper limb, including shoulder: Secondary | ICD-10-CM | POA: Diagnosis not present

## 2019-12-06 DIAGNOSIS — L570 Actinic keratosis: Secondary | ICD-10-CM | POA: Diagnosis not present

## 2020-01-17 DIAGNOSIS — L570 Actinic keratosis: Secondary | ICD-10-CM | POA: Diagnosis not present

## 2020-01-17 DIAGNOSIS — C44529 Squamous cell carcinoma of skin of other part of trunk: Secondary | ICD-10-CM | POA: Diagnosis not present

## 2020-01-17 DIAGNOSIS — L853 Xerosis cutis: Secondary | ICD-10-CM | POA: Diagnosis not present

## 2020-01-17 DIAGNOSIS — C44629 Squamous cell carcinoma of skin of left upper limb, including shoulder: Secondary | ICD-10-CM | POA: Diagnosis not present

## 2020-04-30 ENCOUNTER — Ambulatory Visit (INDEPENDENT_AMBULATORY_CARE_PROVIDER_SITE_OTHER): Payer: Medicare Other | Admitting: Emergency Medicine

## 2020-04-30 ENCOUNTER — Other Ambulatory Visit: Payer: Self-pay

## 2020-04-30 ENCOUNTER — Encounter: Payer: Self-pay | Admitting: Emergency Medicine

## 2020-04-30 VITALS — BP 130/80 | HR 82 | Temp 97.6°F | Resp 16 | Ht 70.0 in | Wt 163.0 lb

## 2020-04-30 DIAGNOSIS — R569 Unspecified convulsions: Secondary | ICD-10-CM | POA: Diagnosis not present

## 2020-04-30 DIAGNOSIS — I10 Essential (primary) hypertension: Secondary | ICD-10-CM

## 2020-04-30 DIAGNOSIS — E785 Hyperlipidemia, unspecified: Secondary | ICD-10-CM | POA: Diagnosis not present

## 2020-04-30 DIAGNOSIS — C4361 Malignant melanoma of right upper limb, including shoulder: Secondary | ICD-10-CM

## 2020-04-30 MED ORDER — LISINOPRIL 40 MG PO TABS: 40.0000 mg | ORAL_TABLET | Freq: Every day | ORAL | 3 refills | Status: DC

## 2020-04-30 MED ORDER — HYDROCHLOROTHIAZIDE 12.5 MG PO CAPS: 12.5000 mg | ORAL_CAPSULE | Freq: Every day | ORAL | 3 refills | Status: DC

## 2020-04-30 MED ORDER — AMLODIPINE BESYLATE 10 MG PO TABS: 10.0000 mg | ORAL_TABLET | Freq: Every day | ORAL | 3 refills | Status: DC

## 2020-04-30 MED ORDER — FAMOTIDINE 20 MG PO TABS: 20.0000 mg | ORAL_TABLET | Freq: Two times a day (BID) | ORAL | 3 refills | Status: DC | PRN

## 2020-04-30 MED ORDER — ATORVASTATIN CALCIUM 40 MG PO TABS: 40.0000 mg | ORAL_TABLET | Freq: Every day | ORAL | 3 refills | Status: DC

## 2020-04-30 NOTE — Patient Instructions (Addendum)
   If you have lab work done today you will be contacted with your lab results within the next 2 weeks.  If you have not heard from us then please contact us. The fastest way to get your results is to register for My Chart.   IF you received an x-ray today, you will receive an invoice from [facility]. Please contact [facility] at [phone] with questions or concerns regarding your invoice.   IF you received labwork today, you will receive an invoice from [facility]. Please contact [facility] at [phone] with questions or concerns regarding your invoice.   Our billing staff will not be able to assist you with questions regarding bills from these companies.  You will be contacted with the lab results as soon as they are available. The fastest way to get your results is to activate your My Chart account. Instructions are located on the last page of this paperwork. If you have not heard from us regarding the results in 2 weeks, please contact this office.      Hypertension, Adult High blood pressure (hypertension) is when the force of blood pumping through the arteries is too strong. The arteries are the blood vessels that carry blood from the heart throughout the body. Hypertension forces the heart to work harder to pump blood and may cause arteries to become narrow or stiff. Untreated or uncontrolled hypertension can cause a heart attack, heart failure, a stroke, kidney disease, and other problems. A blood pressure reading consists of a higher number over a lower number. Ideally, your blood pressure should be below 120/80. The first ("top") number is called the systolic pressure. It is a measure of the pressure in your arteries as your heart beats. The second ("bottom") number is called the diastolic pressure. It is a measure of the pressure in your arteries as the heart relaxes. What are the causes? The exact cause of this condition is not known. There are some conditions  that result in or are related to high blood pressure. What increases the risk? Some risk factors for high blood pressure are under your control. The following factors may make you more likely to develop this condition:  Smoking.  Having type 2 diabetes mellitus, high cholesterol, or both.  Not getting enough exercise or physical activity.  Being overweight.  Having too much fat, sugar, calories, or salt (sodium) in your diet.  Drinking too much alcohol. Some risk factors for high blood pressure may be difficult or impossible to change. Some of these factors include:  Having chronic kidney disease.  Having a family history of high blood pressure.  Age. Risk increases with age.  Race. You may be at higher risk if you are African American.  Gender. Men are at higher risk than women before age 45. After age 65, women are at higher risk than men.  Having obstructive sleep apnea.  Stress. What are the signs or symptoms? High blood pressure may not cause symptoms. Very high blood pressure (hypertensive crisis) may cause:  Headache.  Anxiety.  Shortness of breath.  Nosebleed.  Nausea and vomiting.  Vision changes.  Severe chest pain.  Seizures. How is this diagnosed? This condition is diagnosed by measuring your blood pressure while you are seated, with your arm resting on a flat surface, your legs uncrossed, and your feet flat on the floor. The cuff of the blood pressure monitor will be placed directly against the skin of your upper arm at the level of your   heart. It should be measured at least twice using the same arm. Certain conditions can cause a difference in blood pressure between your right and left arms. Certain factors can cause blood pressure readings to be lower or higher than normal for a short period of time:  When your blood pressure is higher when you are in a health care provider's office than when you are at home, this is called white coat hypertension.  Most people with this condition do not need medicines.  When your blood pressure is higher at home than when you are in a health care provider's office, this is called masked hypertension. Most people with this condition may need medicines to control blood pressure. If you have a high blood pressure reading during one visit or you have normal blood pressure with other risk factors, you may be asked to:  Return on a different day to have your blood pressure checked again.  Monitor your blood pressure at home for 1 week or longer. If you are diagnosed with hypertension, you may have other blood or imaging tests to help your health care provider understand your overall risk for other conditions. How is this treated? This condition is treated by making healthy lifestyle changes, such as eating healthy foods, exercising more, and reducing your alcohol intake. Your health care provider may prescribe medicine if lifestyle changes are not enough to get your blood pressure under control, and if:  Your systolic blood pressure is above 130.  Your diastolic blood pressure is above 80. Your personal target blood pressure may vary depending on your medical conditions, your age, and other factors. Follow these instructions at home: Eating and drinking   Eat a diet that is high in fiber and potassium, and low in sodium, added sugar, and fat. An example eating plan is called the DASH (Dietary Approaches to Stop Hypertension) diet. To eat this way: ? Eat plenty of fresh fruits and vegetables. Try to fill one half of your plate at each meal with fruits and vegetables. ? Eat whole grains, such as whole-wheat pasta, brown rice, or whole-grain bread. Fill about one fourth of your plate with whole grains. ? Eat or drink low-fat dairy products, such as skim milk or low-fat yogurt. ? Avoid fatty cuts of meat, processed or cured meats, and poultry with skin. Fill about one fourth of your plate with lean proteins, such  as fish, chicken without skin, beans, eggs, or tofu. ? Avoid pre-made and processed foods. These tend to be higher in sodium, added sugar, and fat.  Reduce your daily sodium intake. Most people with hypertension should eat less than 1,500 mg of sodium a day.  Do not drink alcohol if: ? Your health care provider tells you not to drink. ? You are pregnant, may be pregnant, or are planning to become pregnant.  If you drink alcohol: ? Limit how much you use to:  0-1 drink a day for women.  0-2 drinks a day for men. ? Be aware of how much alcohol is in your drink. In the [place] one drink equals one 12 oz bottle of beer (355 mL), one 5 oz glass of wine (148 mL), or one 1 oz glass of hard liquor (44 mL). Lifestyle   Work with your health care provider to maintain a healthy body weight or to lose weight. Ask what an ideal weight is for you.  Get at least 30 minutes of exercise most days of the week. Activities may include walking, swimming,   or biking.  Include exercise to strengthen your muscles (resistance exercise), such as Pilates or lifting weights, as part of your weekly exercise routine. Try to do these types of exercises for 30 minutes at least 3 days a week.  Do not use any products that contain nicotine or tobacco, such as cigarettes, e-cigarettes, and chewing tobacco. If you need help quitting, ask your health care provider.  Monitor your blood pressure at home as told by your health care provider.  Keep all follow-up visits as told by your health care provider. This is important. Medicines  Take over-the-counter and prescription medicines only as told by your health care provider. Follow directions carefully. Blood pressure medicines must be taken as prescribed.  Do not skip doses of blood pressure medicine. Doing this puts you at risk for problems and can make the medicine less effective.  Ask your health care provider about side effects or reactions to medicines that you  should watch for. Contact a health care provider if you:  Think you are having a reaction to a medicine you are taking.  Have headaches that keep coming back (recurring).  Feel dizzy.  Have swelling in your ankles.  Have trouble with your vision. Get help right away if you:  Develop a severe headache or confusion.  Have unusual weakness or numbness.  Feel faint.  Have severe pain in your chest or abdomen.  Vomit repeatedly.  Have trouble breathing. Summary  Hypertension is when the force of blood pumping through your arteries is too strong. If this condition is not controlled, it may put you at risk for serious complications.  Your personal target blood pressure may vary depending on your medical conditions, your age, and other factors. For most people, a normal blood pressure is less than 120/80.  Hypertension is treated with lifestyle changes, medicines, or a combination of both. Lifestyle changes include losing weight, eating a healthy, low-sodium diet, exercising more, and limiting alcohol. This information is not intended to replace advice given to you by your health care provider. Make sure you discuss any questions you have with your health care provider. Document Revised: 02/08/2018 Document Reviewed: 02/08/2018 [employer]vier Patient Education  2020 [employer]  

## 2020-04-30 NOTE — Progress Notes (Signed)
Martin Edwards 74 y.o.   Chief Complaint  Patient presents with  . Hypertension    follow up 6 month  . Medication Refill    Pend    HISTORY OF PRESENT ILLNESS: This is a 74 y.o. male with history of hypertension here for follow-up and medication refills. Presently taking amlodipine 10 mg daily and lisinopril 40 mg daily.  Also takes hydrochlorothiazide 12.5 mg daily. Also has history of dyslipidemia on atorvastatin 40 mg daily. Has no complaints or medical concerns today.  HPI   Prior to Admission medications   Medication Sig Start Date End Date Taking? Authorizing Provider  amLODipine (NORVASC) 10 MG tablet Take 1 tablet (10 mg total) by mouth daily. 10/29/19  Yes Rutherford Guys, MD  atorvastatin (LIPITOR) 40 MG tablet Take 1 tablet (40 mg total) by mouth daily. 10/29/19  Yes Rutherford Guys, MD  famotidine (PEPCID) 20 MG tablet Take 1 tablet (20 mg total) by mouth 2 (two) times daily as needed for heartburn or indigestion. 10/29/19  Yes Rutherford Guys, MD  hydrochlorothiazide (MICROZIDE) 12.5 MG capsule Take 1 capsule (12.5 mg total) by mouth daily. 10/29/19  Yes Rutherford Guys, MD  lisinopril (ZESTRIL) 40 MG tablet Take 1 tablet (40 mg total) by mouth daily. 10/29/19  Yes Rutherford Guys, MD    Allergies  Allergen Reactions  . Other Rash    Needs Hypoallergenic Sheets/ Laundry Cart had questionable allergic reaction to detergent last time he was here.    Patient Active Problem List   Diagnosis Date Noted  . 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 History of cranioplasty 03/07/2015  . Seizures ([place]) 11/27/2014  . 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 Prostate cancer ([place]) 12/12/2012  . Essential hypertension 10/21/2011  . Hyperlipidemia 10/21/2011  . Adenomatous colon polyp 10/21/2011    Past Medical History:  Diagnosis Date  . Basal cell carcinoma of face X 2  . Brow ptosis    . GERD (gastroesophageal reflux disease)   . History of kidney stones   . Hyperlipidemia   . Hypertension   . Prostate cancer ([place]) 1999  . Seizures ([place]) 11/2014   brain trauma -hematoma  . Small bowel obstruction due to adhesions ([place]) 01/30/2014  . Subdural hematoma [facility])     Past Surgical History:  Procedure Laterality Date  . APPLICATION OF A-CELL OF HEAD/NECK N/A 08/29/2017   Procedure: APPLICATION OF A-CELL;  Surgeon: Wallace Going, DO;  Location: [place];  Service: Plastics;  Laterality: N/A;  . BASAL CELL CARCINOMA EXCISION  X 2   "off my face both times"  . BRAIN SURGERY    . COLECTOMY  10/2010   "took out 8 inches of colon; wasn't cancer"  . CRANIOTOMY Right 11/11/2014   Procedure: CRANIOTOMY HEMATOMA EVACUATION SUBDURAL;  Surgeon: Leeroy Cha, MD;  Location: [place] NEURO ORS;  Service: Neurosurgery;  Laterality: Right;  . CRANIOTOMY Right 11/13/2014   Procedure: Removal bone flap, insertion into the abdominal wall;  Surgeon: Leeroy Cha, MD;  Location: [place] NEUR[facility]S;  Service: Neurosurgery;  Laterality: Right;  . CRANIOTOMY N/A 03/07/2015   Procedure: Cranioplasty using Bone flap in abdomen;  Surgeon: Leeroy Cha, MD;  Location: [place] NEUR[facility] ORS;  Service: Neurosurgery;  Laterality: N/A;  Cranioplasty using Bone flap in abdomen  . CRANIOTOMY N/A 08/29/2017   Procedure: CRANIECTOMY AND DEBRIDEMENT;  Surgeon: Jovita Gamma, MD;  [facility];  Service: Neurosurgery;  Laterality: N/A;  CRANIECTOMY AND DEBRIDEMENT  . EXCISION MASS NECK Right 04/14/2016   Procedure: EXCISION BIOPSY OF RIGHT NECK LYMPH NODE;  Surgeon: Jerrell Belfast, MD;  Location: [place];  Service: ENT;  Laterality: Right;  . EXCISION MELANOMA WITH SENTINEL LYMPH NODE BIOPSY Right 04/14/2016   Procedure: WIDE EXCISION MELANOMA RIGHT SHOULDER WITH SENTINEL  NODE MAPPING;  Surgeon: Autumn Messing III, MD;  Location: [place];  Service: General;  Laterality: Right;  . PROSTATECTOMY  05/20/1998  . SCALP LACERATION  REPAIR N/A 08/29/2017   Procedure: SCALP FLAP TO COVER DEFECT;  Surgeon: Wallace Going, DO;  Location: [place];  Service: Plastics;  Laterality: N/A;    Social History   Socioeconomic History  . Marital status: Divorced    Spouse name: Not on file  . Number of children: 2  . Years of education: 74  . Highest education level: Not on file  Occupational History  . Occupation: retired  Tobacco Use  . Smoking status: Former Research scientist (life sciences)  . Smokeless tobacco: Never Used  Vaping Use  . Vaping Use: Never used  Substance and Sexual Activity  . Alcohol use: Yes    Alcohol/week: 0.0 standard drinks    Comment: 01/30/2014 "maybe a beer twice/yr"  . Drug use: No  . Sexual activity: Not Currently  Other Topics Concern  . Not on file  Social History Narrative   Exercise: 2-3 times a wk      Patient drinks 2 cups of caffeine daily.   Patient is right handed.   Social Determinants of Health   Financial Resource Strain:   . Difficulty of Paying Living Expenses: Not on file  Food Insecurity:   . Worried About Charity fundraiser in the Last Year: Not on file  . Ran Out of Food in the Last Year: Not on file  Transportation Needs:   . Lack of Transportation (Medical): Not on file  . Lack of Transportation (Non-Medical): Not on file  Physical Activity:   . Days of Exercise per Week: Not on file  . Minutes of Exercise per Session: Not on file  Stress:   . Feeling of Stress : Not on file  Social Connections:   . Frequency of Communication with Friends and Family: Not on file  . Frequency of Social Gatherings with Friends and Family: Not on file  . Attends Religious Services: Not on file  . Active Member of Clubs or Organizations: Not on file  . Attends Archivist Meetings: Not on file  . Marital Status: Not on file  Intimate Partner Violence:   . Fear of Current or Ex-Partner: Not on file  . Emotionally Abused: Not on file  . Physically Abused: Not on file  . Sexually  Abused: Not on file    Family History  Problem Relation Age of Onset  . Cancer Father        Lung     Review of Systems  Constitutional: Negative.  Negative for chills and fever.  HENT: Negative.  Negative for congestion and sore throat.        Chronic runny nose for the past several months  Respiratory: Negative.  Negative for cough and shortness of breath.   Cardiovascular: Negative.  Negative for chest pain and palpitations.  Gastrointestinal: Negative.  Negative for abdominal pain, diarrhea, nausea and vomiting.  Genitourinary: Negative.  Negative for dysuria and hematuria.  Musculoskeletal: Negative.  Negative for back pain, myalgias and neck  pain.  Skin: Negative.  Negative for rash.  Neurological: Negative for dizziness and headaches.  All other systems reviewed and are negative.   Today's Vitals   04/30/20 0949  BP: (!) 150/75  Pulse: (!) 117  Resp: 16  Temp: 97.6 F (36.4 C)  TempSrc: Temporal  SpO2: 98%  Weight: 163 lb (73.9 kg)  Height: 5\' 10"  (1.778 m)   Body mass index is 23.39 kg/m. Wt Readings from Last 3 Encounters:  04/30/20 163 lb (73.9 kg)  10/29/19 156 lb 6.4 oz (70.9 kg)  04/30/19 159 lb 3.2 oz (72.2 kg)    Physical Exam Vitals reviewed.  Constitutional:      Appearance: Normal appearance.  HENT:     Head: Normocephalic.     Nose: Nose normal.     Mouth/Throat:     Mouth: Mucous membranes are moist.     Pharynx: Oropharynx is clear.  Eyes:     Extraocular Movements: Extraocular movements intact.     Conjunctiva/sclera: Conjunctivae normal.     Pupils: Pupils are equal, round, and reactive to light.  Cardiovascular:     Rate and Rhythm: Normal rate and regular rhythm.     Pulses: Normal pulses.     Heart sounds: Normal heart sounds.  Pulmonary:     Effort: Pulmonary effort is normal.     Breath sounds: Normal breath sounds.  Abdominal:     Tenderness: There is no abdominal tenderness.  Musculoskeletal:     Cervical back: Normal  range of motion and neck supple.  Skin:    General: Skin is warm and dry.     Capillary Refill: Capillary refill takes less than 2 seconds.  Neurological:     General: No focal deficit present.     Mental Status: He is alert and oriented to person, place, and time.  Psychiatric:        Mood and Affect: Mood normal.        Behavior: Behavior normal.    A total of 30 minutes was spent with the patient, greater than 50% of which was in counseling/coordination of care regarding multiple chronic medical problems including hypertension and cardiovascular risks associated with this condition, review of all medications, review of most recent office visit notes, review of most recent blood work results which shows mild hypercalcemia, need to have blood repeated, education on nutrition, review of health maintenance items, review of recent skin lesion surgical pathology report, documentation, prognosis, and need for follow-up.    ASSESSMENT & PLAN: Clinically stable.  No medical concerns identified during this visit.  Continue present medications.  No changes.  Follow-up in 6 months.  Brain was seen today for hypertension and medication refill.  Diagnoses and all orders for this visit:  Essential hypertension -     Comprehensive metabolic panel -     amLODipine (NORVASC) 10 MG tablet; Take 1 tablet (10 mg total) by mouth daily. -     hydrochlorothiazide (MICROZIDE) 12.5 MG capsule; Take 1 capsule (12.5 mg total) by mouth daily. -     lisinopril (ZESTRIL) 40 MG tablet; Take 1 tablet (40 mg total) by mouth daily.  Hyperlipidemia, unspecified hyperlipidemia type -     atorvastatin (LIPITOR) 40 MG tablet; Take 1 tablet (40 mg total) by mouth daily.  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    Patient Instructions        If you have lab work done today you will be contacted with your lab results within the next 2 weeks.  If you have not heard from [place] then please contact us. The fastest way to get your results is to register for My Chart.   IF you received an x-ray today, you will receive an invoice from [facility]. Please contact [facility] at [phone] with questions or concerns regarding your invoice.   IF you received labwork today, you will receive an invoice from [place] [facility]. Please contact [facility] at [phone] with questions or concerns regarding your invoice.   Our billing staff will not be able to assist you with questions regarding bills from these companies.  You will be contacted with the lab results as soon as they are available. The fastest way to get your results is to activate your My Chart account. Instructions are located on the last page of this paperwork. If you have not heard from [place] regarding the results in 2 weeks, please contact this office.     Hypertension, Adult High blood pressure (hypertension) is when the force of blood pumping through the arteries is too strong. The arteries are the blood vessels that carry blood from the heart throughout the body. Hypertension forces the heart to work harder to pump blood and may cause arteries to become narrow or stiff. Untreated or uncontrolled hypertension can cause a heart attack, heart failure, a stroke, kidney disease, and other problems. A blood pressure reading consists of a higher number over a lower number. Ideally, your blood pressure should be below 120/80. The first ("top") number is called the systolic pressure. It is a measure of the pressure in your arteries as your heart beats. The second ("bottom") number is called the diastolic pressure. It is a measure of the pressure in your arteries as the heart relaxes. What are the causes? The exact cause of this condition is not known. There are some  conditions that result in or are related to high blood pressure. What increases the risk? Some risk factors for high blood pressure are under your control. The following factors may make you more likely to develop this condition:  Smoking.  Having type 2 diabetes mellitus, high cholesterol, or both.  Not getting enough exercise or physical activity.  Being overweight.  Having too much fat, sugar, calories, or salt (sodium) in your diet.  Drinking too much alcohol. Some risk factors for high blood pressure may be difficult or impossible to change. Some of these factors include:  Having chronic kidney disease.  Having a family history of high blood pressure.  Age. Risk increases with age.  Race. You may be at higher risk if you are African American.  Gender. Men are at higher risk than women before age 66. After age 86, women are at higher risk than men.  Having obstructive sleep apnea.  Stress. What are the signs or symptoms? High blood pressure may not cause symptoms. Very high blood pressure (hypertensive crisis) may cause:  Headache.  Anxiety.  Shortness of breath.  Nosebleed.  Nausea and vomiting.  Vision changes.  Severe chest pain.  Seizures. How is this diagnosed? This condition is diagnosed by measuring your blood pressure while you are seated, with your arm resting on a flat surface, your legs uncrossed, and your feet flat on the floor. The cuff of the blood pressure monitor will be placed  directly against the skin of your upper arm at the level of your heart. It should be measured at least twice using the same arm. Certain conditions can cause a difference in blood pressure between your right and left arms. Certain factors can cause blood pressure readings to be lower or higher than normal for a short period of time:  When your blood pressure is higher when you are in a health care provider's office than when you are at home, this is called white coat  hypertension. Most people with this condition do not need medicines.  When your blood pressure is higher at home than when you are in a health care provider's office, this is called masked hypertension. Most people with this condition may need medicines to control blood pressure. If you have a high blood pressure reading during one visit or you have normal blood pressure with other risk factors, you may be asked to:  Return on a different day to have your blood pressure checked again.  Monitor your blood pressure at home for 1 week or longer. If you are diagnosed with hypertension, you may have other blood or imaging tests to help your health care provider understand your overall risk for other conditions. How is this treated? This condition is treated by making healthy lifestyle changes, such as eating healthy foods, exercising more, and reducing your alcohol intake. Your health care provider may prescribe medicine if lifestyle changes are not enough to get your blood pressure under control, and if:  Your systolic blood pressure is above 130.  Your diastolic blood pressure is above 80. Your personal target blood pressure may vary depending on your medical conditions, your age, and other factors. Follow these instructions at home: Eating and drinking   Eat a diet that is high in fiber and potassium, and low in sodium, added sugar, and fat. An example eating plan is called the DASH (Dietary Approaches to Stop Hypertension) diet. To eat this way: ? Eat plenty of fresh fruits and vegetables. Try to fill one half of your plate at each meal with fruits and vegetables. ? Eat whole grains, such as whole-wheat pasta, brown rice, or whole-grain bread. Fill about one fourth of your plate with whole grains. ? Eat or drink low-fat dairy products, such as skim milk or low-fat yogurt. ? Avoid fatty cuts of meat, processed or cured meats, and poultry with skin. Fill about one fourth of your plate with lean  proteins, such as fish, chicken without skin, beans, eggs, or tofu. ? Avoid pre-made and processed foods. These tend to be higher in sodium, added sugar, and fat.  Reduce your daily sodium intake. Most people with hypertension should eat less than 1,500 mg of sodium a day.  Do not drink alcohol if: ? Your health care provider tells you not to drink. ? You are pregnant, may be pregnant, or are planning to become pregnant.  If you drink alcohol: ? Limit how much you use to:  0-1 drink a day for women.  0-2 drinks a day for men. ? Be aware of how much alcohol is in your drink. In the [place] one drink equals one 12 oz bottle of beer (355 mL), one 5 oz glass of wine (148 mL), or one 1 oz glass of hard liquor (44 mL). Lifestyle   Work with your health care provider to maintain a healthy body weight or to lose weight. Ask what an ideal weight is for you.  Get at least 30  minutes of exercise most days of the week. Activities may include walking, swimming, or biking.  Include exercise to strengthen your muscles (resistance exercise), such as Pilates or lifting weights, as part of your weekly exercise routine. Try to do these types of exercises for 30 minutes at least 3 days a week.  Do not use any products that contain nicotine or tobacco, such as cigarettes, e-cigarettes, and chewing tobacco. If you need help quitting, ask your health care provider.  Monitor your blood pressure at home as told by your health care provider.  Keep all follow-up visits as told by your health care provider. This is important. Medicines  Take over-the-counter and prescription medicines only as told by your health care provider. Follow directions carefully. Blood pressure medicines must be taken as prescribed.  Do not skip doses of blood pressure medicine. Doing this puts you at risk for problems and can make the medicine less effective.  Ask your health care provider about side effects or reactions to  medicines that you should watch for. Contact a health care provider if you:  Think you are having a reaction to a medicine you are taking.  Have headaches that keep coming back (recurring).  Feel dizzy.  Have swelling in your ankles.  Have trouble with your vision. Get help right away if you:  Develop a severe headache or confusion.  Have unusual weakness or numbness.  Feel faint.  Have severe pain in your chest or abdomen.  Vomit repeatedly.  Have trouble breathing. Summary  Hypertension is when the force of blood pumping through your arteries is too strong. If this condition is not controlled, it may put you at risk for serious complications.  Your personal target blood pressure may vary depending on your medical conditions, your age, and other factors. For most people, a normal blood pressure is less than 120/80.  Hypertension is treated with lifestyle changes, medicines, or a combination of both. Lifestyle changes include losing weight, eating a healthy, low-sodium diet, exercising more, and limiting alcohol. This information is not intended to replace advice given to you by your health care provider. Make sure you discuss any questions you have with your health care provider. Document Revised: 02/08/2018 Document Reviewed: 02/08/2018 Elsevier Patient Education  2020 [employer]      Agustina Caroli, MD [facility]

## 2020-05-01 LAB — COMPREHENSIVE METABOLIC PANEL
ALT: 16 IU/L (ref 0–44)
AST: 21 IU/L (ref 0–40)
Albumin/Globulin Ratio: 1.7 (ref 1.2–2.2)
Albumin: 5 g/dL — ABNORMAL HIGH (ref 3.7–4.7)
Alkaline Phosphatase: 85 IU/L (ref 44–121)
BUN/Creatinine Ratio: 9 — ABNORMAL LOW (ref 10–24)
BUN: 10 mg/dL (ref 8–27)
Bilirubin Total: 0.9 mg/dL (ref 0.0–1.2)
CO2: 22 mmol/L (ref 20–29)
Calcium: 9.7 mg/dL (ref 8.6–10.2)
Chloride: 97 mmol/L (ref 96–106)
Creatinine, Ser: 1.06 mg/dL (ref 0.76–1.27)
GFR calc Af Amer: 80 mL/min/{1.73_m2} (ref >59)
GFR calc non Af Amer: 69 mL/min/{1.73_m2} (ref >59)
Globulin, Total: 3 g/dL (ref 1.5–4.5)
Glucose: 144 mg/dL — ABNORMAL HIGH (ref 65–99)
Potassium: 4.4 mmol/L (ref 3.5–5.2)
Sodium: 137 mmol/L (ref 134–144)
Total Protein: 8 g/dL (ref 6.0–8.5)

## 2020-05-19 ENCOUNTER — Telehealth: Payer: Self-pay | Admitting: *Deleted

## 2020-05-19 ENCOUNTER — Ambulatory Visit (INDEPENDENT_AMBULATORY_CARE_PROVIDER_SITE_OTHER): Payer: Medicare Other | Admitting: Emergency Medicine

## 2020-05-19 VITALS — BP 130/80 | Ht 70.0 in | Wt 163.0 lb

## 2020-05-19 DIAGNOSIS — Z0000 Encounter for general adult medical examination without abnormal findings: Secondary | ICD-10-CM

## 2020-05-19 NOTE — Patient Instructions (Addendum)
Thank you for taking time to come for your Medicare Wellness Visit. I appreciate your ongoing commitment to your health goals. Please review the following plan we discussed and let me know if I can assist you in the future.  Julie Greer LPN  Advance Directive  Advance directives are legal documents that let you make choices ahead of time about your health care and medical treatment in case you become unable to communicate for yourself. Advance directives are a way for you to make known your wishes to family, friends, and health care providers. This can let others know about your end-of-life care if you become unable to communicate. Discussing and writing advance directives should happen over time rather than all at once. Advance directives can be changed depending on your situation and what you want, even after you have signed the advance directives. There are different types of advance directives, such as:  Medical power of attorney.  Living will.  Do not resuscitate (DNR) or do not attempt resuscitation (DNAR) order. Health care proxy and medical power of attorney A health care proxy is also called a health care agent. This is a person who is appointed to make medical decisions for you in cases where you are unable to make the decisions yourself. Generally, people choose someone they know well and trust to represent their preferences. Make sure to ask this person for an agreement to act as your proxy. A proxy may have to exercise judgment in the event of a medical decision for which your wishes are not known. A medical power of attorney is a legal document that names your health care proxy. Depending on the laws in your state, after the document is written, it may also need to be:  Signed.  Notarized.  Dated.  Copied.  Witnessed.  Incorporated into your medical record. You may also want to appoint someone to manage your money in a situation in which you are unable to do so. This is  called a durable power of attorney for finances. It is a separate legal document from the durable power of attorney for health care. You may choose the same person or someone different from your health care proxy to act as your agent in money matters. If you do not appoint a proxy, or if there is a concern that the proxy is not acting in your best interests, a court may appoint a guardian to act on your behalf. Living will A living will is a set of instructions that state your wishes about medical care when you cannot express them yourself. Health care providers should keep a copy of your living will in your medical record. You may want to give a copy to family members or friends. To alert caregivers in case of an emergency, you can place a card in your wallet to let them know that you have a living will and where they can find it. A living will is used if you become:  Terminally ill.  Disabled.  Unable to communicate or make decisions. Items to consider in your living will include:  To use or not to use life-support equipment, such as dialysis machines and breathing machines (ventilators).  A DNR or DNAR order. This tells health care providers not to use cardiopulmonary resuscitation (CPR) if breathing or heartbeat stops.  To use or not to use tube feeding.  To be given or not to be given food and fluids.  Comfort (palliative) care when the goal becomes comfort rather than   than a cure.  Donation of organs and tissues. A living will does not give instructions for distributing your money and property if you should pass away. DNR or DNAR A DNR or DNAR order is a request not to have CPR in the event that your heart stops beating or you stop breathing. If a DNR or DNAR order has not been made and shared, a health care provider will try to help any patient whose heart has stopped or who has stopped breathing. If you plan to have surgery, talk with your health care provider about how your DNR or DNAR  order will be followed if problems occur. What if I do not have an advance directive? If you do not have an advance directive, some states assign family decision makers to act on your behalf based on how closely you are related to them. Each state has its own laws about advance directives. You may want to check with your health care provider, attorney, or state representative about the laws in your state. Summary  Advance directives are the legal documents that allow you to make choices ahead of time about your health care and medical treatment in case you become unable to tell others about your care.  The process of discussing and writing advance directives should happen over time. You can change the advance directives, even after you have signed them.  Advance directives include DNR or DNAR orders, living wills, and designating an agent as your medical power of attorney. This information is not intended to replace advice given to you by your health care provider. Make sure you discuss any questions you have with your health care provider. Document Revised: 12/28/2018 Document Reviewed: 12/28/2018 Elsevier Patient Education  2020 Elsevier Inc. Preventive Care 65 Years and Older, Male Preventive care refers to lifestyle choices and visits with your health care provider that can promote health and wellness. This includes:  A yearly physical exam. This is also called an annual well check.  Regular dental and eye exams.  Immunizations.  Screening for certain conditions.  Healthy lifestyle choices, such as diet and exercise. What can I expect for my preventive care visit? Physical exam Your health care provider will check:  Height and weight. These may be used to calculate body mass index (BMI), which is a measurement that tells if you are at a healthy weight.  Heart rate and blood pressure.  Your skin for abnormal spots. Counseling Your health care provider may ask you questions  about:  Alcohol, tobacco, and drug use.  Emotional well-being.  Home and relationship well-being.  Sexual activity.  Eating habits.  History of falls.  Memory and ability to understand (cognition).  Work and work environment. What immunizations do I need?  Influenza (flu) vaccine  This is recommended every year. Tetanus, diphtheria, and pertussis (Tdap) vaccine  You may need a Td booster every 10 years. Varicella (chickenpox) vaccine  You may need this vaccine if you have not already been vaccinated. Zoster (shingles) vaccine  You may need this after age 60. Pneumococcal conjugate (PCV13) vaccine  One dose is recommended after age 75. Pneumococcal polysaccharide (PPSV23) vaccine  One dose is recommended after age 75. Measles, mumps, and rubella (MMR) vaccine  You may need at least one dose of MMR if you were born in 1957 or later. You may also need a second dose. Meningococcal conjugate (MenACWY) vaccine  You may need this if you have certain conditions. Hepatitis A vaccine  You may need this   if you have certain conditions or if you travel or work in places where you may be exposed to hepatitis A. Hepatitis B vaccine  You may need this if you have certain conditions or if you travel or work in places where you may be exposed to hepatitis B. Haemophilus influenzae type b (Hib) vaccine  You may need this if you have certain conditions. You may receive vaccines as individual doses or as more than one vaccine together in one shot (combination vaccines). Talk with your health care provider about the risks and benefits of combination vaccines. What tests do I need? Blood tests  Lipid and cholesterol levels. These may be checked every 5 years, or more frequently depending on your overall health.  Hepatitis C test.  Hepatitis B test. Screening  Lung cancer screening. You may have this screening every year starting at age 55 if you have a 30-pack-year history of  smoking and currently smoke or have quit within the past 15 years.  Colorectal cancer screening. All adults should have this screening starting at age 50 and continuing until age 75. Your health care provider may recommend screening at age 45 if you are at increased risk. You will have tests every 1-10 years, depending on your results and the type of screening test.  Prostate cancer screening. Recommendations will vary depending on your family history and other risks.  Diabetes screening. This is done by checking your blood sugar (glucose) after you have not eaten for a while (fasting). You may have this done every 1-3 years.  Abdominal aortic aneurysm (AAA) screening. You may need this if you are a current or former smoker.  Sexually transmitted disease (STD) testing. Follow these instructions at home: Eating and drinking  Eat a diet that includes fresh fruits and vegetables, whole grains, lean protein, and low-fat dairy products. Limit your intake of foods with high amounts of sugar, saturated fats, and salt.  Take vitamin and mineral supplements as recommended by your health care provider.  Do not drink alcohol if your health care provider tells you not to drink.  If you drink alcohol: ? Limit how much you have to 0-2 drinks a day. ? Be aware of how much alcohol is in your drink. In the [place] one drink equals one 12 oz bottle of beer (355 mL), one 5 oz glass of wine (148 mL), or one 1 oz glass of hard liquor (44 mL). Lifestyle  Take daily care of your teeth and gums.  Stay active. Exercise for at least 30 minutes on 5 or more days each week.  Do not use any products that contain nicotine or tobacco, such as cigarettes, e-cigarettes, and chewing tobacco. If you need help quitting, ask your health care provider.  If you are sexually active, practice safe sex. Use a condom or other form of protection to prevent STIs (sexually transmitted infections).  Talk with your health care  provider about taking a low-dose aspirin or statin. What's next?  Visit your health care provider once a year for a well check visit.  Ask your health care provider how often you should have your eyes and teeth checked.  Stay up to date on all vaccines. This information is not intended to replace advice given to you by your health care provider. Make sure you discuss any questions you have with your health care provider. Document Revised: 05/25/2018 Document Reviewed: 05/25/2018 Elsevier Patient Education  2020 Elsevier Inc.  

## 2020-05-19 NOTE — Telephone Encounter (Signed)
Patient was never contacted about labs , Can they be reviewed    Comprehensive metabolic panel (Order [id number]) Contains abnormal dataComprehensive metabolic panel Order: [id number] Status:  Final result  Visible to patient:  No (inaccessible in MyChart)  Next appt:  10/29/2020 at 01:20 PM in [facility], [place])  Dx:  Essential hypertension  0 Result Notes  Ref Range & Units 2 wk ago  Glucose 65 - 99 mg/dL 144High   BUN 8 - 27 mg/dL 10   Creatinine, Ser 0.76 - 1.27 mg/dL 1.06   GFR calc non Af Amer >59 mL/min/1.73 69   GFR calc Af Amer >59 mL/min/1.73 80   Comment: **In accordance with recommendations from the NKF-ASN Task force,**   [facility] is in the process of updating its eGFR calculation to the   2021 CKD-EPI creatinine equation that estimates kidney function   without a race variable.   BUN/Creatinine Ratio 10 - 24 9Low   Sodium 134 - 144 mmol/L 137   Potassium 3.5 - 5.2 mmol/L 4.4   Chloride 96 - 106 mmol/L 97   CO2 20 - 29 mmol/L 22   Calcium 8.6 - 10.2 mg/dL 9.7   Total Protein 6.0 - 8.5 g/dL 8.0   Albumin 3.7 - 4.7 g/dL 5.0High   Globulin, Total 1.5 - 4.5 g/dL 3.0   Albumin/Globulin Ratio 1.2 - 2.2 1.7   Bilirubin Total 0.0 - 1.2 mg/dL 0.9   Alkaline Phosphatase 44 - 121 IU/L 85   Comment:       **Please note reference interval change**  AST 0 - 40 IU/L 21   ALT 0 - 44 IU/L 16   Resulting Agency  [facility]    Narrative Performed by: Maryan Puls Performed at: [facility]  [address] [place], [place] [id number]  Lab Director: Rush Farmer MD, Phone: [phone]    Specimen Collected: 04/30/20 11:07  Last Resulted: 05/01/20 00:35     Lab Flowsheet   Order Details   View Encounter   Lab and Collection Details   Routing   Result History       Result Care Coordination  Patient Communication  Add Comments Not seen Back to Top      Ordering Comments  Not on file  Order Questions       Comprehensive metabolic panel: Patient Communication  Add Comments Not seen  All Reviewers List  Horald Pollen, MD on 05/01/2020 08:14  Encounter  View Encounter       Result Information  Flag: AbnormalAbnormal  Status: Final result (Collected: 04/30/2020 11:07) Provider Status: Reviewed    Lab Information  [facility]    Order-Level Documents:  There are no order-level documents. View SmartLink Info  Comprehensive metabolic panel (Order #[id number]) on 04/30/20  Result Read / Acknowledged  User Time Read / Donavan Foil, MD 05/01/2020 8:14 AM  Comprehensive metabolic panel: Patient Communication  Add Comments Not seen

## 2020-05-19 NOTE — Telephone Encounter (Signed)
Is this question about lab results?

## 2020-05-19 NOTE — Progress Notes (Signed)
Presents today for [employer] Visit   Date of last exam: 04/30/2020  Interpreter used for this visit?  No  I connected with  Martin Edwards on 05/19/20 by a telephone application and verified that I am speaking with the correct person using two identifiers.   I discussed the limitations of evaluation and management by telemedicine. The patient expressed understanding and agreed to proceed.  Patient location: home  Provider location: in office  I provided 20 minutes of non face - to - face time during this encounter.  Patient Care Team: Horald Pollen, MD as PCP - General (Internal Medicine) Jovita Gamma, MD as Consulting Physician (Neurosurgery)   Other items to address today:   Discussed eye/dental Discussed immunization Follow 10-30-2019 @1 :20   Other Screening: Last screening for diabetes: 04/30/2020 Last lipid screening: 04/30/2020  ADVANCE DIRECTIVES: Discussed:yes Materials Provided: yes  Immunization status:  Immunization History  Administered Date(s) Administered  . PFIZER SARS-COV-2 Vaccination 07/24/2019, 08/14/2019  . Pneumococcal Conjugate-13 06/18/2014  . Pneumococcal-Unspecified 06/14/2010  . Tdap 06/27/2009     There are no preventive care reminders to display for this patient.   Functional Status Survey: Is the patient deaf or have difficulty hearing?: No Does the patient have difficulty seeing, even when wearing glasses/contacts?: No Does the patient have difficulty concentrating, remembering, or making decisions?: No Does the patient have difficulty walking or climbing stairs?: No Does the patient have difficulty dressing or bathing?: No Does the patient have difficulty doing errands alone such as visiting a doctor's office or shopping?: No   6CIT Screen 05/19/2020 04/12/2019  What Year? 0 points 0 points  What month? 0 points 0 points  What time? 0 points 0 points  Count back from 20 0 points 0 points    Months in reverse 0 points 0 points  Repeat phrase 2 points 4 points  Total Score 2 4        Clinical Support from 05/19/2020 in [facility]  AUDIT-C Score 0       Home Environment:   No difficulty climbing stairs Lives in a one story home Yes grab bars No scattered rugs Adequate lighting/ no clutter   Patient Active Problem List   Diagnosis Date Noted  . 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 History of cranioplasty 03/07/2015  . Seizures ([place]) 11/27/2014  . 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 Prostate cancer ([place]) 12/12/2012  . Essential hypertension 10/21/2011  . Hyperlipidemia 10/21/2011  . Adenomatous colon polyp 10/21/2011     Past Medical History:  Diagnosis Date  . Basal cell carcinoma of face X 2  . Brow ptosis   . GERD (gastroesophageal reflux disease)   . History of kidney stones   . Hyperlipidemia   . Hypertension   . Prostate cancer ([place]) 1999  . Seizures ([place]) 11/2014   brain trauma -hematoma  . Small bowel obstruction due to adhesions ([place]) 01/30/2014  . Subdural hematoma [facility])      Past Surgical History:  Procedure Laterality Date  . APPLICATION OF A-CELL OF HEAD/NECK N/A 08/29/2017   Procedure: APPLICATION OF A-CELL;  Surgeon: Wallace Going, DO;  Location: [facility];  Service: Plastics;  Laterality: N/A;  . BASAL CELL CARCINOMA EXCISION  X 2   "off my face both times"  . BRAIN SURGERY    . COLECTOMY  10/2010   "took out  8 inches of colon; wasn't cancer"  . CRANIOTOMY Right 11/11/2014   Procedure: CRANIOTOMY HEMATOMA EVACUATION SUBDURAL;  Surgeon: Leeroy Cha, MD;  Location: [facility];  Service: Neurosurgery;  Laterality: Right;  . CRANIOTOMY Right 11/13/2014   Procedure: Removal bone flap, insertion into the abdominal wall;  Surgeon: Leeroy Cha, MD;  Location: [place] [facility] [facility];  Service: Neurosurgery;  Laterality: Right;  . CRANIOTOMY  N/A 03/07/2015   Procedure: Cranioplasty using Bone flap in abdomen;  Surgeon: Leeroy Cha, MD;  Location: [facility] [facility];  Service: Neurosurgery;  Laterality: N/A;  Cranioplasty using Bone flap in abdomen  . CRANIOTOMY N/A 08/29/2017   Procedure: CRANIECTOMY AND DEBRIDEMENT;  Surgeon: Jovita Gamma, MD;  Location: [facility];  Service: Neurosurgery;  Laterality: N/A;  CRANIECTOMY AND DEBRIDEMENT  . EXCISION MASS NECK Right 04/14/2016   Procedure: EXCISION BIOPSY OF RIGHT NECK LYMPH NODE;  Surgeon: Jerrell Belfast, MD;  Location: [facility];  Service: ENT;  Laterality: Right;  . EXCISION MELANOMA WITH SENTINEL LYMPH NODE BIOPSY Right 04/14/2016   Procedure: WIDE EXCISION MELANOMA RIGHT SHOULDER WITH SENTINEL  NODE MAPPING;  Surgeon: Autumn Messing III, MD;  Location: [facility];  Service: General;  Laterality: Right;  . PROSTATECTOMY  05/20/1998  . SCALP LACERATION REPAIR N/A 08/29/2017   Procedure: SCALP FLAP TO COVER DEFECT;  Surgeon: Wallace Going, DO;  Location: [facility];  Service: Plastics;  Laterality: N/A;     Family History  Problem Relation Age of Onset  . Cancer Father        Lung     Social History   Socioeconomic History  . Marital status: Divorced    Spouse name: Not on file  . Number of children: 2  . Years of education: 36  . Highest education level: Not on file  Occupational History  . Occupation: retired  Tobacco Use  . Smoking status: Former Research scientist (life sciences)  . Smokeless tobacco: Never Used  Vaping Use  . Vaping Use: Never used  Substance and Sexual Activity  . Alcohol use: Yes    Alcohol/week: 0.0 standard drinks    Comment: 01/30/2014 "maybe a beer twice/yr"  . Drug use: No  . Sexual activity: Not Currently  Other Topics Concern  . Not on file  Social History Narrative   Exercise: 2-3 times a wk      Patient drinks 2 cups of caffeine daily.   Patient is right handed.   Social Determinants of Health   Financial Resource Strain:   . Difficulty of Paying Living Expenses: Not  on file  Food Insecurity:   . Worried About Charity fundraiser in the Last Year: Not on file  . Ran Out of Food in the Last Year: Not on file  Transportation Needs:   . Lack of Transportation (Medical): Not on file  . Lack of Transportation (Non-Medical): Not on file  Physical Activity:   . Days of Exercise per Week: Not on file  . Minutes of Exercise per Session: Not on file  Stress:   . Feeling of Stress : Not on file  Social Connections:   . Frequency of Communication with Friends and Family: Not on file  . Frequency of Social Gatherings with Friends and Family: Not on file  . Attends Religious Services: Not on file  . Active Member of Clubs or Organizations: Not on file  . Attends Archivist Meetings: Not on file  . Marital Status: Not on file  Intimate Partner Violence:   .  Fear of Current or Ex-Partner: Not on file  . Emotionally Abused: Not on file  . Physically Abused: Not on file  . Sexually Abused: Not on file     Allergies  Allergen Reactions  . Other Rash    Needs Hypoallergenic Sheets/ Laundry Cart had questionable allergic reaction to detergent last time he was here.     Prior to Admission medications   Medication Sig Start Date End Date Taking? Authorizing Provider  amLODipine (NORVASC) 10 MG tablet Take 1 tablet (10 mg total) by mouth daily. 04/30/20  Yes Sagardia, Ines Bloomer, MD  atorvastatin (LIPITOR) 40 MG tablet Take 1 tablet (40 mg total) by mouth daily. 04/30/20  Yes Sagardia, Ines Bloomer, MD  famotidine (PEPCID) 20 MG tablet Take 1 tablet (20 mg total) by mouth 2 (two) times daily as needed for heartburn or indigestion. 04/30/20  Yes Sagardia, Ines Bloomer, MD  hydrochlorothiazide (MICROZIDE) 12.5 MG capsule Take 1 capsule (12.5 mg total) by mouth daily. 04/30/20  Yes Sagardia, Ines Bloomer, MD  lisinopril (ZESTRIL) 40 MG tablet Take 1 tablet (40 mg total) by mouth daily. 04/30/20  Yes Horald Pollen, MD     Depression screen [facility] 2/9  05/19/2020 04/30/2020 10/29/2019 04/30/2019 04/12/2019  Decreased Interest 0 0 0 0 0  Down, Depressed, Hopeless 0 0 0 0 0  PHQ - 2 Score 0 0 0 0 0     Fall Risk  05/19/2020 04/30/2020 10/29/2019 04/30/2019 04/12/2019  Falls in the past year? 0 0 0 0 0  Number falls in past yr: 0 - 0 - 0  Injury with Fall? 0 - 0 - 0  Risk for fall due to : - - Impaired balance/gait - -  Follow up Falls evaluation completed;Education provided Falls evaluation completed - Falls evaluation completed Falls evaluation completed;Education provided;Falls prevention discussed      PHYSICAL EXAM: BP 130/80 Comment: not in clinic/ taking from a previous visit  Ht 5\' 10"  (1.778 m)   Wt 163 lb (73.9 kg)   BMI 23.39 kg/m    Wt Readings from Last 3 Encounters:  05/19/20 163 lb (73.9 kg)  04/30/20 163 lb (73.9 kg)  10/29/19 156 lb 6.4 oz (70.9 kg)       Education/Counseling provided regarding diet and exercise, prevention of chronic diseases, smoking/tobacco cessation, if applicable, and reviewed "Covered Medicare Preventive Services."

## 2020-06-18 DIAGNOSIS — L578 Other skin changes due to chronic exposure to nonionizing radiation: Secondary | ICD-10-CM | POA: Diagnosis not present

## 2020-06-18 DIAGNOSIS — L218 Other seborrheic dermatitis: Secondary | ICD-10-CM | POA: Diagnosis not present

## 2020-06-18 DIAGNOSIS — Z85828 Personal history of other malignant neoplasm of skin: Secondary | ICD-10-CM | POA: Diagnosis not present

## 2020-06-18 DIAGNOSIS — L905 Scar conditions and fibrosis of skin: Secondary | ICD-10-CM | POA: Diagnosis not present

## 2020-06-18 DIAGNOSIS — L821 Other seborrheic keratosis: Secondary | ICD-10-CM | POA: Diagnosis not present

## 2020-06-18 DIAGNOSIS — L570 Actinic keratosis: Secondary | ICD-10-CM | POA: Diagnosis not present

## 2020-10-29 ENCOUNTER — Ambulatory Visit: Payer: Self-pay | Admitting: Emergency Medicine

## 2020-11-05 ENCOUNTER — Ambulatory Visit (INDEPENDENT_AMBULATORY_CARE_PROVIDER_SITE_OTHER): Payer: Medicare Other | Admitting: Emergency Medicine

## 2020-11-05 ENCOUNTER — Other Ambulatory Visit: Payer: Self-pay

## 2020-11-05 ENCOUNTER — Encounter: Payer: Self-pay | Admitting: Emergency Medicine

## 2020-11-05 VITALS — BP 130/80 | HR 97 | Temp 98.3°F | Ht 70.0 in | Wt 157.0 lb

## 2020-11-05 DIAGNOSIS — I10 Essential (primary) hypertension: Secondary | ICD-10-CM

## 2020-11-05 DIAGNOSIS — E785 Hyperlipidemia, unspecified: Secondary | ICD-10-CM | POA: Diagnosis not present

## 2020-11-05 DIAGNOSIS — R569 Unspecified convulsions: Secondary | ICD-10-CM

## 2020-11-05 DIAGNOSIS — C4361 Malignant melanoma of right upper limb, including shoulder: Secondary | ICD-10-CM

## 2020-11-05 DIAGNOSIS — C434 Malignant melanoma of scalp and neck: Secondary | ICD-10-CM | POA: Diagnosis not present

## 2020-11-05 DIAGNOSIS — F03A0 Unspecified dementia, mild, without behavioral disturbance, psychotic disturbance, mood disturbance, and anxiety: Secondary | ICD-10-CM | POA: Insufficient documentation

## 2020-11-05 DIAGNOSIS — R4189 Other symptoms and signs involving cognitive functions and awareness: Secondary | ICD-10-CM

## 2020-11-05 LAB — COMPREHENSIVE METABOLIC PANEL
ALT: 16 U/L (ref 0–53)
AST: 16 U/L (ref 0–37)
Albumin: 4.7 g/dL (ref 3.5–5.2)
Alkaline Phosphatase: 77 U/L (ref 39–117)
BUN: 10 mg/dL (ref 6–23)
CO2: 30 mEq/L (ref 19–32)
Calcium: 9.9 mg/dL (ref 8.4–10.5)
Chloride: 98 mEq/L (ref 96–112)
Creatinine, Ser: 0.97 mg/dL (ref 0.40–1.50)
GFR: 76.41 mL/min (ref >60.00)
Glucose, Bld: 115 mg/dL — ABNORMAL HIGH (ref 70–99)
Potassium: 4.3 mEq/L (ref 3.5–5.1)
Sodium: 137 mEq/L (ref 135–145)
Total Bilirubin: 1.2 mg/dL (ref 0.2–1.2)
Total Protein: 8.2 g/dL (ref 6.0–8.3)

## 2020-11-05 LAB — LIPID PANEL
Cholesterol: 132 mg/dL (ref 0–200)
HDL: 56.4 mg/dL (ref >39.00)
LDL Cholesterol: 59 mg/dL (ref 0–99)
NonHDL: 75.22
Total CHOL/HDL Ratio: 2
Triglycerides: 81 mg/dL (ref 0.0–149.0)
VLDL: 16.2 mg/dL (ref 0.0–40.0)

## 2020-11-05 LAB — CBC WITH DIFFERENTIAL/PLATELET
Basophils Absolute: 0 10*3/uL (ref 0.0–0.1)
Basophils Relative: 0.5 % (ref 0.0–3.0)
Eosinophils Absolute: 0 10*3/uL (ref 0.0–0.7)
Eosinophils Relative: 0.1 % (ref 0.0–5.0)
HCT: 45.1 % (ref 39.0–52.0)
Hemoglobin: 15.3 g/dL (ref 13.0–17.0)
Lymphocytes Relative: 18.6 % (ref 12.0–46.0)
Lymphs Abs: 1.1 10*3/uL (ref 0.7–4.0)
MCHC: 34 g/dL (ref 30.0–36.0)
MCV: 91.4 fl (ref 78.0–100.0)
Monocytes Absolute: 0.5 10*3/uL (ref 0.1–1.0)
Monocytes Relative: 7.9 % (ref 3.0–12.0)
Neutro Abs: 4.5 10*3/uL (ref 1.4–7.7)
Neutrophils Relative %: 72.9 % (ref 43.0–77.0)
Platelets: 182 10*3/uL (ref 150.0–400.0)
RBC: 4.93 Mil/uL (ref 4.22–5.81)
RDW: 13.6 % (ref 11.5–15.5)
WBC: 6.1 10*3/uL (ref 4.0–10.5)

## 2020-11-05 LAB — TSH: TSH: 1.38 u[IU]/mL (ref 0.35–4.50)

## 2020-11-05 LAB — VITAMIN B12: Vitamin B-12: 121 pg/mL — ABNORMAL LOW (ref 211–911)

## 2020-11-05 MED ORDER — LISINOPRIL 40 MG PO TABS: 40.0000 mg | ORAL_TABLET | Freq: Every day | ORAL | 3 refills | Status: DC

## 2020-11-05 MED ORDER — FAMOTIDINE 20 MG PO TABS: 20.0000 mg | ORAL_TABLET | Freq: Two times a day (BID) | ORAL | 3 refills | Status: DC | PRN

## 2020-11-05 MED ORDER — ATORVASTATIN CALCIUM 40 MG PO TABS: 40.0000 mg | ORAL_TABLET | Freq: Every day | ORAL | 3 refills | Status: DC

## 2020-11-05 MED ORDER — AMLODIPINE BESYLATE 10 MG PO TABS: 10.0000 mg | ORAL_TABLET | Freq: Every day | ORAL | 3 refills | Status: DC

## 2020-11-05 MED ORDER — HYDROCHLOROTHIAZIDE 12.5 MG PO CAPS: 12.5000 mg | ORAL_CAPSULE | Freq: Every day | ORAL | 3 refills | Status: DC

## 2020-11-05 NOTE — Assessment & Plan Note (Signed)
Diet and nutrition discussed. Continue atorvastatin 40 mg daily. 

## 2020-11-05 NOTE — Patient Instructions (Signed)
Health Maintenance After Age 76 After age 76, you are at a higher risk for certain long-term diseases and infections as well as injuries from falls. Falls are a major cause of broken bones and head injuries in people who are older than age 76. Getting regular preventive care can help to keep you healthy and well. Preventive care includes getting regular testing and making lifestyle changes as recommended by your health care provider. Talk with your health care provider about:  Which screenings and tests you should have. A screening is a test that checks for a disease when you have no symptoms.  A diet and exercise plan that is right for you. What should I know about screenings and tests to prevent falls? Screening and testing are the best ways to find a health problem early. Early diagnosis and treatment give you the best chance of managing medical conditions that are common after age 76. Certain conditions and lifestyle choices may make you more likely to have a fall. Your health care provider may recommend:  Regular vision checks. Poor vision and conditions such as cataracts can make you more likely to have a fall. If you wear glasses, make sure to get your prescription updated if your vision changes.  Medicine review. Work with your health care provider to regularly review all of the medicines you are taking, including over-the-counter medicines. Ask your health care provider about any side effects that may make you more likely to have a fall. Tell your health care provider if any medicines that you take make you feel dizzy or sleepy.  Osteoporosis screening. Osteoporosis is a condition that causes the bones to get weaker. This can make the bones weak and cause them to break more easily.  Blood pressure screening. Blood pressure changes and medicines to control blood pressure can make you feel dizzy.  Strength and balance checks. Your health care provider may recommend certain tests to check your  strength and balance while standing, walking, or changing positions.  Foot health exam. Foot pain and numbness, as well as not wearing proper footwear, can make you more likely to have a fall.  Depression screening. You may be more likely to have a fall if you have a fear of falling, feel emotionally low, or feel unable to do activities that you used to do.  Alcohol use screening. Using too much alcohol can affect your balance and may make you more likely to have a fall. What actions can I take to lower my risk of falls? General instructions  Talk with your health care provider about your risks for falling. Tell your health care provider if: ? You fall. Be sure to tell your health care provider about all falls, even ones that seem minor. ? You feel dizzy, sleepy, or off-balance.  Take over-the-counter and prescription medicines only as told by your health care provider. These include any supplements.  Eat a healthy diet and maintain a healthy weight. A healthy diet includes low-fat dairy products, low-fat (lean) meats, and fiber from whole grains, beans, and lots of fruits and vegetables. Home safety  Remove any tripping hazards, such as rugs, cords, and clutter.  Install safety equipment such as grab bars in bathrooms and safety rails on stairs.  Keep rooms and walkways well-lit. Activity  Follow a regular exercise program to stay fit. This will help you maintain your balance. Ask your health care provider what types of exercise are appropriate for you.  If you need a cane or walker,   use it as recommended by your health care provider.  Wear supportive shoes that have nonskid soles.   Lifestyle  Do not drink alcohol if your health care provider tells you not to drink.  If you drink alcohol, limit how much you have: ? 0-1 drink a day for women. ? 0-2 drinks a day for men.  Be aware of how much alcohol is in your drink. In the [place] one drink equals one typical bottle of beer (12  oz), one-half glass of wine (5 oz), or one shot of hard liquor (1 oz).  Do not use any products that contain nicotine or tobacco, such as cigarettes and e-cigarettes. If you need help quitting, ask your health care provider. Summary  Having a healthy lifestyle and getting preventive care can help to protect your health and wellness after age 76.  Screening and testing are the best way to find a health problem early and help you avoid having a fall. Early diagnosis and treatment give you the best chance for managing medical conditions that are more common for people who are older than age 76.  Falls are a major cause of broken bones and head injuries in people who are older than age 76. Take precautions to prevent a fall at home.  Work with your health care provider to learn what changes you can make to improve your health and wellness and to prevent falls. This information is not intended to replace advice given to you by your health care provider. Make sure you discuss any questions you have with your health care provider. Document Revised: 09/21/2018 Document Reviewed: 04/13/2017 Elsevier Patient Education  2021 Elsevier Inc.  

## 2020-11-05 NOTE — Assessment & Plan Note (Signed)
Needs neurology evaluation.  Affecting quality of life.

## 2020-11-05 NOTE — Assessment & Plan Note (Signed)
Well-controlled hypertension.  Continue amlodipine 10 mg, hydrochlorothiazide 12.5 mg, and lisinopril 40 mg daily. Diet and nutrition discussed.

## 2020-11-05 NOTE — Assessment & Plan Note (Signed)
Stable.  Sees dermatologist on a regular basis.

## 2020-11-05 NOTE — Progress Notes (Signed)
Martin Edwards 75 y.o.   Chief Complaint  Patient presents with  . Hypertension    6 month  check. Medication refill on amlodipine, lipitor, pepcid, microzide, zestril    HISTORY OF PRESENT ILLNESS: This is a 75 y.o. male with history of hypertension here for follow-up and medication refill. 1.  Hypertension: On amlodipine 10 mg daily and lisinopril 40 mg daily and hydrochlorothiazide 12.5 mg daily. Normal blood pressure readings at home. 2.  Dyslipidemia: On Lipitor 40 mg daily. 3.  History of melanoma. Here with his son today who is concerned about his cognitive abilities.  Thinks he has been slowly declining over the past 2 years.  More forgetful and having memory issues.  Has seen Dr. Jannifer Franklin in the past and would like to see him again for neurology evaluation. Patient also complaining of increased gas, Pepcid not working very well. No other complaint or medical concerns today.   HPI   Prior to Admission medications   Medication Sig Start Date End Date Taking? Authorizing Provider  amLODipine (NORVASC) 10 MG tablet Take 1 tablet (10 mg total) by mouth daily. 04/30/20  Yes Sussie Minor, Ines Bloomer, MD  atorvastatin (LIPITOR) 40 MG tablet Take 1 tablet (40 mg total) by mouth daily. 04/30/20  Yes Chereese Cilento, Ines Bloomer, MD  famotidine (PEPCID) 20 MG tablet Take 1 tablet (20 mg total) by mouth 2 (two) times daily as needed for heartburn or indigestion. 04/30/20  Yes Henri Guedes, Ines Bloomer, MD  hydrochlorothiazide (MICROZIDE) 12.5 MG capsule Take 1 capsule (12.5 mg total) by mouth daily. 04/30/20  Yes Blessed Girdner, Ines Bloomer, MD  lisinopril (ZESTRIL) 40 MG tablet Take 1 tablet (40 mg total) by mouth daily. 04/30/20  Yes Horald Pollen, MD    Allergies  Allergen Reactions  . Other Rash    Needs Hypoallergenic Sheets/ Laundry Cart had questionable allergic reaction to detergent last time he was here.    Patient Active Problem List   Diagnosis Date Noted  . 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 History of cranioplasty 03/07/2015  . Seizures ([place]) 11/27/2014  . 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 Prostate cancer ([place]) 12/12/2012  . Essential hypertension 10/21/2011  . Hyperlipidemia 10/21/2011  . Adenomatous colon polyp 10/21/2011    Past Medical History:  Diagnosis Date  . Basal cell carcinoma of face X 2  . Brow ptosis   . GERD (gastroesophageal reflux disease)   . History of kidney stones   . Hyperlipidemia   . Hypertension   . Prostate cancer ([place]) 1999  . Seizures ([place]) 11/2014   brain trauma -hematoma  . Small bowel obstruction due to adhesions ([place]) 01/30/2014  . Subdural hematoma [facility])     Past Surgical History:  Procedure Laterality Date  . APPLICATION OF A-CELL OF HEAD/NECK N/A 08/29/2017   Procedure: APPLICATION OF A-CELL;  Surgeon: Wallace Going, DO;  Location: [place];  Service: Plastics;  Laterality: N/A;  . BASAL CELL CARCINOMA EXCISION  X 2   "off my face both times"  . BRAIN SURGERY    . COLECTOMY  10/2010   "took out 8 inches of colon; wasn't cancer"  . CRANIOTOMY Right 11/11/2014   Procedure: CRANIOTOMY HEMATOMA EVACUATION SUBDURAL;  Surgeon: Leeroy Cha, MD;  Location: [place] NEURO ORS;  Service: Neurosurgery;  Laterality: Right;  . CRANIOTOMY Right 11/13/2014   Procedure: Removal bone flap, insertion into the abdominal wall;  Surgeon:  Leeroy Cha, MD;  Location: [place] NEURO ORS;  Service: Neurosurgery;  Laterality: Right;  . CRANIOTOMY N/A 03/07/2015   Procedure: Cranioplasty using Bone flap in abdomen;  Surgeon: Leeroy Cha, MD;  Location: [place] NEURO ORS;  Service: Neurosurgery;  Laterality: N/A;  Cranioplasty using Bone flap in abdomen  . CRANIOTOMY N/A 08/29/2017   Procedure: CRANIECTOMY AND DEBRIDEMENT;  Surgeon: Jovita Gamma, MD;  Location: [place];  Service: Neurosurgery;  Laterality: N/A;  CRANIECTOMY AND DEBRIDEMENT  . EXCISION MASS  NECK Right 04/14/2016   Procedure: EXCISION BIOPSY OF RIGHT NECK LYMPH NODE;  Surgeon: Jerrell Belfast, MD;  Location: [place];  Service: ENT;  Laterality: Right;  . EXCISION MELANOMA WITH SENTINEL LYMPH NODE BIOPSY Right 04/14/2016   Procedure: WIDE EXCISION MELANOMA RIGHT SHOULDER WITH SENTINEL  NODE MAPPING;  Surgeon: Autumn Messing III, MD;  Location: [place];  Service: General;  Laterality: Right;  . PROSTATECTOMY  05/20/1998  . SCALP LACERATION REPAIR N/A 08/29/2017   Procedure: SCALP FLAP TO COVER DEFECT;  Surgeon: Wallace Going, DO;  Location: [place];  Service: Plastics;  Laterality: N/A;    Social History   Socioeconomic History  . Marital status: Divorced    Spouse name: Not on file  . Number of children: 2  . Years of education: 33  . Highest education level: Not on file  Occupational History  . Occupation: retired  Tobacco Use  . Smoking status: Former Research scientist (life sciences)  . Smokeless tobacco: Never Used  Vaping Use  . Vaping Use: Never used  Substance and Sexual Activity  . Alcohol use: Yes    Alcohol/week: 0.0 standard drinks    Comment: 01/30/2014 "maybe a beer twice/yr"  . Drug use: No  . Sexual activity: Not Currently  Other Topics Concern  . Not on file  Social History Narrative   Exercise: 2-3 times a wk      Patient drinks 2 cups of caffeine daily.   Patient is right handed.   Social Determinants of Health   Financial Resource Strain: Not on file  Food Insecurity: Not on file  Transportation Needs: Not on file  Physical Activity: Not on file  Stress: Not on file  Social Connections: Not on file  Intimate Partner Violence: Not on file    Family History  Problem Relation Age of Onset  . Cancer Father        Lung     Review of Systems  Constitutional: Negative.  Negative for chills and fever.  HENT: Negative.  Negative for congestion and sore throat.   Respiratory: Negative.  Negative for cough and shortness of breath.   Cardiovascular: Negative.  Negative for  chest pain and palpitations.  Gastrointestinal: Negative for abdominal pain, diarrhea, nausea and vomiting.       Increase gas  Genitourinary: Negative.  Negative for dysuria and hematuria.  Musculoskeletal: Negative for back pain, myalgias and neck pain.  Skin: Negative.  Negative for rash.  Neurological: Negative.  Negative for dizziness, speech change, focal weakness, seizures, loss of consciousness and headaches.  All other systems reviewed and are negative.   Today's Vitals   11/05/20 1027  BP: (!) 162/82  Pulse: 97  Temp: 98.3 F (36.8 C)  TempSrc: Oral  SpO2: 97%  Weight: 157 lb (71.2 kg)  Height: 5\' 10"  (1.778 m)   Body mass index is 22.53 kg/m. Wt Readings from Last 3 Encounters:  11/05/20 157 lb (71.2 kg)  05/19/20 163 lb (73.9 kg)  04/30/20 163 lb (73.9  kg)    Physical Exam Vitals reviewed.  Constitutional:      Appearance: Normal appearance.  HENT:     Head: Normocephalic.  Eyes:     Extraocular Movements: Extraocular movements intact.     Conjunctiva/sclera: Conjunctivae normal.     Pupils: Pupils are equal, round, and reactive to light.  Cardiovascular:     Rate and Rhythm: Normal rate and regular rhythm.     Pulses: Normal pulses.     Heart sounds: Normal heart sounds.  Pulmonary:     Effort: Pulmonary effort is normal.     Breath sounds: Normal breath sounds.  Musculoskeletal:        General: Normal range of motion.     Cervical back: Normal range of motion and neck supple.  Skin:    Capillary Refill: Capillary refill takes less than 2 seconds.  Neurological:     General: No focal deficit present.     Mental Status: He is alert and oriented to person, place, and time.  Psychiatric:        Mood and Affect: Mood normal.        Behavior: Behavior normal.      ASSESSMENT & PLAN: A total of 30 minutes was spent with the patient and counseling/coordination of care regarding multiple chronic medical problems and cardiovascular risks associated  with these conditions, concerned about cognitive decline and need for neurology evaluation, review of all medications, need for diagnostic blood work, education on nutrition, health maintenance items, prognosis, documentation, need for follow-up.  Essential hypertension Well-controlled hypertension.  Continue amlodipine 10 mg, hydrochlorothiazide 12.5 mg, and lisinopril 40 mg daily. Diet and nutrition discussed.  Cognitive deficits Needs neurology evaluation.  Affecting quality of life.  Hyperlipidemia Diet and nutrition discussed.  Continue atorvastatin 40 mg daily.  Malignant melanoma of neck (HCC) Stable.  Sees dermatologist on a regular basis.  Omid was seen today for hypertension.  Diagnoses and all orders for this visit:  Cognitive deficits -     Ambulatory referral to Neurology -     TSH -     Vitamin B12  Essential hypertension -     amLODipine (NORVASC) 10 MG tablet; Take 1 tablet (10 mg total) by mouth daily. -     hydrochlorothiazide (MICROZIDE) 12.5 MG capsule; Take 1 capsule (12.5 mg total) by mouth daily. -     lisinopril (ZESTRIL) 40 MG tablet; Take 1 tablet (40 mg total) by mouth daily. -     Comprehensive metabolic panel -     CBC with Differential/Platelet  Seizures (HCC)  Malignant melanoma of right shoulder (HCC)  Hyperlipidemia, unspecified hyperlipidemia type -     atorvastatin (LIPITOR) 40 MG tablet; Take 1 tablet (40 mg total) by mouth daily. -     Lipid panel  Malignant melanoma of neck (HCC)  Other orders -     famotidine (PEPCID) 20 MG tablet; Take 1 tablet (20 mg total) by mouth 2 (two) times daily as needed for heartburn or indigestion.    Patient Instructions   Health Maintenance After Age 19 After age 35, you are at a higher risk for certain long-term diseases and infections as well as injuries from falls. Falls are a major cause of broken bones and head injuries in people who are older than age 39. Getting regular preventive care can  help to keep you healthy and well. Preventive care includes getting regular testing and making lifestyle changes as recommended by your health care provider. Talk with  your health care provider about:  Which screenings and tests you should have. A screening is a test that checks for a disease when you have no symptoms.  A diet and exercise plan that is right for you. What should I know about screenings and tests to prevent falls? Screening and testing are the best ways to find a health problem early. Early diagnosis and treatment give you the best chance of managing medical conditions that are common after age 30. Certain conditions and lifestyle choices may make you more likely to have a fall. Your health care provider may recommend:  Regular vision checks. Poor vision and conditions such as cataracts can make you more likely to have a fall. If you wear glasses, make sure to get your prescription updated if your vision changes.  Medicine review. Work with your health care provider to regularly review all of the medicines you are taking, including over-the-counter medicines. Ask your health care provider about any side effects that may make you more likely to have a fall. Tell your health care provider if any medicines that you take make you feel dizzy or sleepy.  Osteoporosis screening. Osteoporosis is a condition that causes the bones to get weaker. This can make the bones weak and cause them to break more easily.  Blood pressure screening. Blood pressure changes and medicines to control blood pressure can make you feel dizzy.  Strength and balance checks. Your health care provider may recommend certain tests to check your strength and balance while standing, walking, or changing positions.  Foot health exam. Foot pain and numbness, as well as not wearing proper footwear, can make you more likely to have a fall.  Depression screening. You may be more likely to have a fall if you have a fear of  falling, feel emotionally low, or feel unable to do activities that you used to do.  Alcohol use screening. Using too much alcohol can affect your balance and may make you more likely to have a fall. What actions can I take to lower my risk of falls? General instructions  Talk with your health care provider about your risks for falling. Tell your health care provider if: ? You fall. Be sure to tell your health care provider about all falls, even ones that seem minor. ? You feel dizzy, sleepy, or off-balance.  Take over-the-counter and prescription medicines only as told by your health care provider. These include any supplements.  Eat a healthy diet and maintain a healthy weight. A healthy diet includes low-fat dairy products, low-fat (lean) meats, and fiber from whole grains, beans, and lots of fruits and vegetables. Home safety  Remove any tripping hazards, such as rugs, cords, and clutter.  Install safety equipment such as grab bars in bathrooms and safety rails on stairs.  Keep rooms and walkways well-lit. Activity  Follow a regular exercise program to stay fit. This will help you maintain your balance. Ask your health care provider what types of exercise are appropriate for you.  If you need a cane or walker, use it as recommended by your health care provider.  Wear supportive shoes that have nonskid soles.   Lifestyle  Do not drink alcohol if your health care provider tells you not to drink.  If you drink alcohol, limit how much you have: ? 0-1 drink a day for women. ? 0-2 drinks a day for men.  Be aware of how much alcohol is in your drink. In the [place] one drink equals  one typical bottle of beer (12 oz), one-half glass of wine (5 oz), or one shot of hard liquor (1 oz).  Do not use any products that contain nicotine or tobacco, such as cigarettes and e-cigarettes. If you need help quitting, ask your health care provider. Summary  Having a healthy lifestyle and getting  preventive care can help to protect your health and wellness after age 51.  Screening and testing are the best way to find a health problem early and help you avoid having a fall. Early diagnosis and treatment give you the best chance for managing medical conditions that are more common for people who are older than age 54.  Falls are a major cause of broken bones and head injuries in people who are older than age 60. Take precautions to prevent a fall at home.  Work with your health care provider to learn what changes you can make to improve your health and wellness and to prevent falls. This information is not intended to replace advice given to you by your health care provider. Make sure you discuss any questions you have with your health care provider. Document Revised: 09/21/2018 Document Reviewed: 04/13/2017 Elsevier Patient Education  2021 [place], MD [facility]

## 2020-12-02 ENCOUNTER — Other Ambulatory Visit: Payer: Self-pay

## 2020-12-02 ENCOUNTER — Encounter: Payer: Self-pay | Admitting: Neurology

## 2020-12-02 ENCOUNTER — Ambulatory Visit: Payer: Medicare Other | Admitting: Neurology

## 2020-12-02 ENCOUNTER — Other Ambulatory Visit: Payer: Self-pay | Admitting: Neurology

## 2020-12-02 VITALS — BP 134/74 | HR 90 | Ht 70.0 in | Wt 155.6 lb

## 2020-12-02 DIAGNOSIS — R413 Other amnesia: Secondary | ICD-10-CM

## 2020-12-02 DIAGNOSIS — E538 Deficiency of other specified B group vitamins: Secondary | ICD-10-CM

## 2020-12-02 HISTORY — DX: Deficiency of other specified B group vitamins: E53.8

## 2020-12-02 MED ORDER — CYANOCOBALAMIN 1000 MCG/ML IJ SOLN
1000.0000 ug | Freq: Once | INTRAMUSCULAR | Status: AC
  Administered 2020-12-02: 1000 ug via INTRAMUSCULAR

## 2020-12-02 NOTE — Progress Notes (Signed)
Reason for visit: Memory disturbance  Referring physician: Dr. Fransisca Kaufmann Martin Edwards is a 74 y.o. 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  The patient was seen previously through this office for the seizures and he was eventually able to come off of his seizure medications and is doing well.  In 2019, he had an infection of the bone flap and had to have this removed, the patient claims that since then he has had some increased problems with memory.  He has had troubles with memory since the original head trauma that occurred on 11 Nov 2014.  He particularly noted problems with word finding, but this issue has gradually worsened over time.  He has had short-term memory issues, but he has not altered any activities of daily living.  He still operates a motor vehicle without difficulty, he is keeping up with his own medications and appointments.  He does his finances.  He currently lives alone.  He claims that he sleeps fairly well and has a good energy level.  He denies any numbness or weakness of extremities, he denies headaches or dizziness.  His primary care physician checked blood work recently that showed evidence of a low vitamin B12 level, he has not yet gone on any supplementation.  He denies any depression problems.  He comes to this office for an evaluation.  Past Medical History:  Diagnosis Date   Basal cell carcinoma of face X 2   Brow ptosis    GERD (gastroesophageal reflux disease)    History of kidney stones    Hyperlipidemia    Hypertension    Prostate cancer ([place]) 1999   Seizures ([place]) 11/2014   brain trauma -hematoma   Small bowel obstruction due to adhesions ([place]) 01/30/2014   Subdural hematoma ([facility])     Past Surgical History:  Procedure Laterality Date   APPLICATION OF A-CELL OF HEAD/NECK N/A 08/29/2017    Procedure: APPLICATION OF A-CELL;  Surgeon: Wallace Going, DO;  Location: [facility];  Service: Plastics;  Laterality: N/A;   BASAL CELL CARCINOMA EXCISION  X 2   "off my face both times"   [place] [facility]  10/2010   "took out 8 inches of colon; wasn't cancer"   CRANIOTOMY Right 11/11/2014   Procedure: CRANIOTOMY HEMATOMA EVACUATION SUBDURAL;  Surgeon: Leeroy Cha, MD;  Location: [facility];  Service: Neurosurgery;  Laterality: Right;   CRANIOTOMY Right 11/13/2014   Procedure: Removal bone flap, insertion into the abdominal wall;  Surgeon: Leeroy Cha, MD;  Location: [facility];  Service: Neurosurgery;  Laterality: Right;   CRANIOTOMY N/A 03/07/2015   Procedure: Cranioplasty using Bone flap in abdomen;  Surgeon: Leeroy Cha, MD;  Location: [facility];  Service: Neurosurgery;  Laterality: N/A;  Cranioplasty using Bone flap in abdomen   CRANIOTOMY N/A 08/29/2017   Procedure: CRANIECTOMY AND DEBRIDEMENT;  Surgeon: Jovita Gamma, MD;  Location: [facility];  Service: Neurosurgery;  Laterality: N/A;  CRANIECTOMY AND DEBRIDEMENT   EXCISION MASS NECK Right 04/14/2016   Procedure: EXCISION BIOPSY OF RIGHT NECK LYMPH NODE;  Surgeon: Jerrell Belfast, MD;  Location: [facility];  Service: ENT;  Laterality: Right;   EXCISION MELANOMA WITH SENTINEL LYMPH NODE BIOPSY Right 04/14/2016   Procedure: WIDE EXCISION MELANOMA RIGHT SHOULDER WITH SENTINEL  NODE [place];  Surgeon: Autumn Messing III, MD;  Location: [facility];  Service: General;  Laterality: Right;   PROSTATECTOMY  05/20/1998   SCALP LACERATION REPAIR N/A 08/29/2017   Procedure: SCALP FLAP TO COVER DEFECT;  Surgeon: Wallace Going, DO;  Location: [facility];  Service: Plastics;  Laterality: N/A;    Family History  Problem Relation Age of Onset   Cancer Father        Lung    Social history:  reports that he has quit smoking. He has never used smokeless tobacco. He reports current alcohol use. He reports that he does not use  drugs.  Medications:  Prior to Admission medications   Medication Sig Start Date End Date Taking? Authorizing Provider  amLODipine (NORVASC) 10 MG tablet Take 1 tablet (10 mg total) by mouth daily. 11/05/20  Yes Sagardia, Ines Bloomer, MD  atorvastatin (LIPITOR) 40 MG tablet Take 1 tablet (40 mg total) by mouth daily. 11/05/20  Yes Sagardia, Ines Bloomer, MD  famotidine (PEPCID) 20 MG tablet Take 1 tablet (20 mg total) by mouth 2 (two) times daily as needed for heartburn or indigestion. 11/05/20  Yes Sagardia, Ines Bloomer, MD  hydrochlorothiazide (MICROZIDE) 12.5 MG capsule Take 1 capsule (12.5 mg total) by mouth daily. 11/05/20  Yes Sagardia, Ines Bloomer, MD  lisinopril (ZESTRIL) 40 MG tablet Take 1 tablet (40 mg total) by mouth daily. 11/05/20  Yes MercerInes Bloomer, MD      Allergies  Allergen Reactions   Other Rash    Needs Hypoallergenic Sheets/ Laundry Cart had questionable allergic reaction to detergent last time he was here.    ROS:  Out of a complete 14 system review of symptoms, the patient complains only of the following symptoms, and all other reviewed systems are negative.  Memory problems Word finding difficulty  Blood pressure 134/74, pulse 90, height 5\' 10"  (1.778 m), weight 155 lb 9.6 oz (70.6 kg).  Physical Exam  General: The patient is alert and cooperative at the time of the examination.  Eyes: Pupils are equal, round, and reactive to light. Discs are flat bilaterally.  Neck: The neck is supple, no carotid bruits are noted.  Respiratory: The respiratory examination is clear.  Cardiovascular: The cardiovascular examination reveals a regular rate and rhythm, no obvious murmurs or rubs are noted.  Skin: Extremities are without significant edema.  Neurologic Exam  Mental status: The patient is alert and oriented x 3 at the time of the examination. The Mini-Mental status examination done today shows a total score of 22/30.  Cranial nerves: Facial symmetry is  present. There is good sensation of the face to pinprick and soft touch bilaterally. The strength of the facial muscles and the muscles to head turning and shoulder shrug are normal bilaterally. Speech is well enunciated, no aphasia or dysarthria is noted. Extraocular movements are full. Visual fields are full. The tongue is midline, and the patient has symmetric elevation of the soft palate. No obvious hearing deficits are noted.  Motor: The motor testing reveals 5 over 5 strength of all 4 extremities. Good symmetric motor tone is noted throughout.  Sensory: Sensory testing is intact to pinprick, soft touch, vibration sensation, and position sense on all 4 extremities. No evidence of extinction is noted.  Coordination: Cerebellar testing reveals good finger-nose-finger and heel-to-shin bilaterally.  Gait and station: Gait is normal. Tandem gait is minimally unsteady.  Romberg is negative. No drift is seen.  Reflexes: Deep tendon reflexes are symmetric and normal in the upper extremities,  knee jerk reflexes are slightly brisk but the ankle jerk reflexes are depressed bilaterally. Toes are downgoing on the right.  The patient has had amputation of the left great toe.   Assessment/Plan:  1.  Memory disturbance  2.  History of head trauma, subdural hematoma  The patient has had prior brain injury putting him at high risk for memory disturbances later in life.  The patient will be set up for MRI of the brain.  He will be given a B12 injection today and start on 1000 mcg orally daily of vitamin B12.  He will follow-up here in 6 months.  He may see Dr. Brett Fairy in the future.  The patient will contact me if he wishes to go on a medication for memory.  [name] 12/02/2020 4:32 PM  [facility] [address] [place], [place] [address]  Phone [phone] Fax [phone]

## 2020-12-03 ENCOUNTER — Telehealth: Payer: Self-pay | Admitting: Neurology

## 2020-12-03 NOTE — Telephone Encounter (Signed)
MRI brain wo contrast [employer] medicare auth: NPR ref # [id number]  Sent to GI for scheduling.

## 2020-12-08 ENCOUNTER — Ambulatory Visit
Admission: RE | Admit: 2020-12-08 | Discharge: 2020-12-08 | Disposition: A | Discharge status: Home / Self Care | Payer: Medicare Other | Source: Ambulatory Visit | Attending: Neurology | Admitting: Neurology

## 2020-12-08 ENCOUNTER — Other Ambulatory Visit: Payer: Self-pay

## 2020-12-08 DIAGNOSIS — R413 Other amnesia: Secondary | ICD-10-CM | POA: Diagnosis not present

## 2020-12-09 ENCOUNTER — Telehealth: Payer: Self-pay | Admitting: Neurology

## 2020-12-09 NOTE — Telephone Encounter (Signed)
I called and talked to the son.  MRI of the brain shows some some small vessel disease, but there is a significant amount of asymmetry of atrophy, much worse on the left brain the right.  Going back to CAT scans even in 2016 this atrophy was likely present at that time.  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   MRI brain 12/09/20:  IMPRESSION: This MRI of the brain without contrast shows the following: 1.   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 2.   There is marked asymmetry of the atrophy with multiple atrophy in the left frontal and parietal lobes compared to the right side and in the right medial temporal lobe compared to the left side.  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 3.   No acute findings.

## 2020-12-10 ENCOUNTER — Telehealth: Payer: Self-pay | Admitting: Neurology

## 2020-12-10 NOTE — Telephone Encounter (Signed)
Pt's daughter on DPR requesting a call back, has some questions she would like to ask, and also wants to know about her dads MRI results. She also wanted to know if the doctor or nurse could recommend any community support groups that her dad could attend. Please advise.

## 2020-12-11 ENCOUNTER — Telehealth: Payer: Self-pay | Admitting: Neurology

## 2020-12-11 NOTE — Telephone Encounter (Signed)
I called and talked with the son.  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  Medication such as Aricept and Namenda usually do not work well with this.  So far, he is doing a lot on his own, but he does have some short-term memory issues.  We will follow this over time.

## 2020-12-11 NOTE — Telephone Encounter (Signed)
Pt's son, Chadley Dziedzic (on [place]) called, have some questions; how to tell father about his results, What stage of dementia, and next step. Would like a call back.

## 2020-12-11 NOTE — Telephone Encounter (Signed)
I called the daughter, I left a message.  I also talk with the son earlier.  If the daughter has any specific questions after listening to my message, she is to call us back.

## 2020-12-16 NOTE — Telephone Encounter (Signed)
I called patient, left a message, I will call back tomorrow.  Phone [phone].

## 2020-12-16 NOTE — Telephone Encounter (Signed)
Pt's daughter, Detron Carras (on [place]) called, know you had spoken with my brother about results, but my father has ask someone call him to review his results. Please contact him at [phone]

## 2020-12-17 NOTE — Telephone Encounter (Signed)
I called the patient and talk with him regarding the results of the MRI, he had no further questions.

## 2020-12-23 DIAGNOSIS — L814 Other melanin hyperpigmentation: Secondary | ICD-10-CM | POA: Diagnosis not present

## 2020-12-23 DIAGNOSIS — L819 Disorder of pigmentation, unspecified: Secondary | ICD-10-CM | POA: Diagnosis not present

## 2020-12-23 DIAGNOSIS — L570 Actinic keratosis: Secondary | ICD-10-CM | POA: Diagnosis not present

## 2020-12-23 DIAGNOSIS — L718 Other rosacea: Secondary | ICD-10-CM | POA: Diagnosis not present

## 2020-12-23 DIAGNOSIS — Z85828 Personal history of other malignant neoplasm of skin: Secondary | ICD-10-CM | POA: Diagnosis not present

## 2020-12-23 DIAGNOSIS — L821 Other seborrheic keratosis: Secondary | ICD-10-CM | POA: Diagnosis not present

## 2020-12-23 DIAGNOSIS — L720 Epidermal cyst: Secondary | ICD-10-CM | POA: Diagnosis not present

## 2020-12-23 DIAGNOSIS — D229 Melanocytic nevi, unspecified: Secondary | ICD-10-CM | POA: Diagnosis not present

## 2020-12-23 DIAGNOSIS — Z85820 Personal history of malignant melanoma of skin: Secondary | ICD-10-CM | POA: Diagnosis not present

## 2020-12-23 DIAGNOSIS — D1801 Hemangioma of skin and subcutaneous tissue: Secondary | ICD-10-CM | POA: Diagnosis not present

## 2020-12-23 DIAGNOSIS — D485 Neoplasm of uncertain behavior of skin: Secondary | ICD-10-CM | POA: Diagnosis not present

## 2020-12-23 DIAGNOSIS — K130 Diseases of lips: Secondary | ICD-10-CM | POA: Diagnosis not present

## 2021-02-03 DIAGNOSIS — D485 Neoplasm of uncertain behavior of skin: Secondary | ICD-10-CM | POA: Diagnosis not present

## 2021-05-13 ENCOUNTER — Encounter: Payer: Self-pay | Admitting: Emergency Medicine

## 2021-05-13 ENCOUNTER — Other Ambulatory Visit: Payer: Self-pay

## 2021-05-13 ENCOUNTER — Ambulatory Visit (INDEPENDENT_AMBULATORY_CARE_PROVIDER_SITE_OTHER): Payer: Medicare Other | Admitting: Emergency Medicine

## 2021-05-13 VITALS — BP 164/80 | HR 102 | Temp 98.2°F | Ht 70.0 in | Wt 153.0 lb

## 2021-05-13 DIAGNOSIS — E538 Deficiency of other specified B group vitamins: Secondary | ICD-10-CM | POA: Diagnosis not present

## 2021-05-13 DIAGNOSIS — F03A0 Unspecified dementia, mild, without behavioral disturbance, psychotic disturbance, mood disturbance, and anxiety: Secondary | ICD-10-CM | POA: Diagnosis not present

## 2021-05-13 DIAGNOSIS — I10 Essential (primary) hypertension: Secondary | ICD-10-CM

## 2021-05-13 DIAGNOSIS — R413 Other amnesia: Secondary | ICD-10-CM | POA: Diagnosis not present

## 2021-05-13 LAB — COMPREHENSIVE METABOLIC PANEL
ALT: 15 U/L (ref 0–53)
AST: 17 U/L (ref 0–37)
Albumin: 4.6 g/dL (ref 3.5–5.2)
Alkaline Phosphatase: 72 U/L (ref 39–117)
BUN: 10 mg/dL (ref 6–23)
CO2: 30 mEq/L (ref 19–32)
Calcium: 9.7 mg/dL (ref 8.4–10.5)
Chloride: 99 mEq/L (ref 96–112)
Creatinine, Ser: 1.03 mg/dL (ref 0.40–1.50)
GFR: 70.84 mL/min (ref >60.00)
Glucose, Bld: 115 mg/dL — ABNORMAL HIGH (ref 70–99)
Potassium: 4.1 mEq/L (ref 3.5–5.1)
Sodium: 136 mEq/L (ref 135–145)
Total Bilirubin: 1.1 mg/dL (ref 0.2–1.2)
Total Protein: 7.9 g/dL (ref 6.0–8.3)

## 2021-05-13 LAB — VITAMIN B12: Vitamin B-12: 140 pg/mL — ABNORMAL LOW (ref 211–911)

## 2021-05-13 LAB — HEMOGLOBIN A1C: Hgb A1c MFr Bld: 6.2 % (ref 4.6–6.5)

## 2021-05-13 MED ORDER — VITAMIN B-12 1000 MCG PO TABS: 1000.0000 ug | ORAL_TABLET | Freq: Every day | ORAL | 3 refills | Status: AC

## 2021-05-13 MED ORDER — LOSARTAN POTASSIUM-HCTZ 100-12.5 MG PO TABS: 1.0000 | ORAL_TABLET | Freq: Every day | ORAL | 3 refills | Status: DC

## 2021-05-13 NOTE — Patient Instructions (Signed)

## 2021-05-13 NOTE — Progress Notes (Signed)
Martin Edwards 75 y.o.   Chief Complaint  Patient presents with   Hypertension    6 mon f/u    HISTORY OF PRESENT ILLNESS: This is a 75 y.o. male with history of hypertension here for follow-up. Presently taking amlodipine 10 mg daily, hydrochlorothiazide 12.5 mg daily, and lisinopril 40 mg daily. Since last office visit [name] went to see neurologist for evaluation of cognitive deficits. Had MRI brain with results as follows: Impression:  This MRI of the brain without contrast shows the following:  1.   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  2.   There is marked asymmetry of the atrophy with multiple atrophy in the  left frontal and parietal lobes compared to the right side and in the  right medial temporal lobe compared to the left side.  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  3.   No acute findings.  INTERPRETING PHYSICIAN:  Richard A. Felecia Shelling, MD, PhD, FAAN  Certified in  Neuroimaging by [employer]   Neurologist assessment and plan as follows: Assessment/Plan:   1.  Memory disturbance   2.  History of head trauma, subdural hematoma   The patient has had prior brain injury putting him at high risk for memory disturbances later in life.  The patient will be set up for MRI of the brain.  He will be given a B12 injection today and start on 1000 mcg orally daily of vitamin B12.  He will follow-up here in 6 months.  He may see Dr. Brett Edwards in the future.  The patient will contact me if he wishes to go on a medication for memory.   [name] 12/02/2020 4:32 PM   [facility] [address] [place], [place] [address]   BP Readings from Last 3 Encounters:  05/13/21 (!) 164/80  12/02/20 134/74  11/05/20 130/80     Hypertension Pertinent negatives include no chest pain, headaches, palpitations or shortness of breath.    Prior to Admission medications   Medication  Sig Start Date End Date Taking? Authorizing Provider  amLODipine (NORVASC) 10 MG tablet Take 1 tablet (10 mg total) by mouth daily. 11/05/20  Yes Reznor Ferrando, Ines Bloomer, MD  atorvastatin (LIPITOR) 40 MG tablet Take 1 tablet (40 mg total) by mouth daily. 11/05/20  Yes Koralynn Greenspan, Ines Bloomer, MD  famotidine (PEPCID) 20 MG tablet Take 1 tablet (20 mg total) by mouth 2 (two) times daily as needed for heartburn or indigestion. 11/05/20  Yes Chereese Cilento, Ines Bloomer, MD  hydrochlorothiazide (MICROZIDE) 12.5 MG capsule Take 1 capsule (12.5 mg total) by mouth daily. 11/05/20  Yes Anahi Belmar, Ines Bloomer, MD  lisinopril (ZESTRIL) 40 MG tablet Take 1 tablet (40 mg total) by mouth daily. 11/05/20  Yes CromwellInes Bloomer, MD    Allergies  Allergen Reactions   Other Rash    Needs Hypoallergenic Sheets/ Laundry Cart had questionable allergic reaction to detergent last time he was here.    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/19/2014   Prostate cancer ([place]) 12/12/2012   Essential hypertension 10/21/2011   Hyperlipidemia 10/21/2011   Adenomatous colon  polyp 10/21/2011    Past Medical History:  Diagnosis Date   Basal cell carcinoma of face X 2   Brow ptosis    GERD (gastroesophageal reflux disease)    History of kidney stones    Hyperlipidemia    Hypertension    Prostate cancer ([place]) 1999   Seizures ([place]) 11/2014   brain trauma -hematoma   Small bowel obstruction due to adhesions ([place]) 01/30/2014   Subdural hematoma    Vitamin B12 deficiency 12/02/2020    Past Surgical History:  Procedure Laterality Date   APPLICATION OF A-CELL OF HEAD/NECK N/A  08/29/2017   Procedure: APPLICATION OF A-CELL;  Surgeon: Wallace Going, DO;  Location: [place] [facility];  Service: Plastics;  Laterality: N/A;   BASAL CELL CARCINOMA EXCISION  X 2   "off my face both times"   [place]  10/2010   "took out 8 inches of colon; wasn't cancer"   CRANIOTOMY Right 11/11/2014   Procedure: CRANIOTOMY HEMATOMA EVACUATION SUBDURAL;  Surgeon: Leeroy Cha, MD;  Location: [place];  Service: Neurosurgery;  Laterality: Right;   CRANIOTOMY Right 11/13/2014   Procedure: Removal bone flap, insertion into the abdominal wall;  Surgeon: Leeroy Cha, MD;  Location: [facility];  Service: Neurosurgery;  Laterality: Right;   CRANIOTOMY N/A 03/07/2015   Procedure: Cranioplasty using Bone flap in abdomen;  Surgeon: Leeroy Cha, MD;  Location: [facility];  Service: Neurosurgery;  Laterality: N/A;  Cranioplasty using Bone flap in abdomen   CRANIOTOMY N/A 08/29/2017   Procedure: CRANIECTOMY AND DEBRIDEMENT;  Surgeon: Jovita Gamma, MD;  Location: [facility];  Service: Neurosurgery;  Laterality: N/A;  CRANIECTOMY AND DEBRIDEMENT   EXCISION MASS NECK Right 04/14/2016   Procedure: EXCISION BIOPSY OF RIGHT NECK LYMPH NODE;  Surgeon: Jerrell Belfast, MD;  Location: [facility];  Service: ENT;  Laterality: Right;   EXCISION MELANOMA WITH SENTINEL LYMPH NODE BIOPSY Right 04/14/2016   Procedure: WIDE EXCISION MELANOMA RIGHT SHOULDER WITH SENTINEL  NODE MAPPING;  Surgeon: Autumn Messing III, MD;  Location: [facility];  Service: General;  Laterality: Right;   PROSTATECTOMY  05/20/1998   SCALP LACERATION REPAIR N/A 08/29/2017   Procedure: SCALP FLAP TO COVER DEFECT;  Surgeon: Wallace Going, DO;  Location: [facility];  Service: Plastics;  Laterality: N/A;    Social History   Socioeconomic History   Marital status: Divorced    Spouse name: Not on file   Number of children: 2   Years of education: 12   Highest education level: Not on file  Occupational History   Occupation: retired   Tobacco Use   Smoking status: Former   Smokeless tobacco: Never  Scientific laboratory technician Use: Never used  Substance and Sexual Activity   Alcohol use: Yes    Alcohol/week: 0.0 standard drinks    Comment: 01/30/2014 "maybe a beer twice/yr"   Drug use: No   Sexual activity: Not Currently  Other Topics Concern   Not on file  Social History Narrative   Exercise: 2-3 times a wk   Lives alone   Patient drinks 2 cups of caffeine daily.   Patient is right handed.   Social Determinants of Health   Financial Resource Strain: Not on file  Food Insecurity: Not on file  Transportation Needs: Not on file  Physical Activity: Not on file  Stress: Not on file  Social Connections: Not on file  Intimate Partner Violence: Not on file    Family  History  Problem Relation Age of Onset   Cancer Father        Lung     Review of Systems  Constitutional: Negative.  Negative for chills and fever.  HENT: Negative.  Negative for congestion and sore throat.   Respiratory: Negative.  Negative for cough and shortness of breath.   Cardiovascular: Negative.  Negative for chest pain and palpitations.  Gastrointestinal: Negative.  Negative for abdominal pain, diarrhea, nausea and vomiting.  Genitourinary: Negative.  Negative for dysuria and hematuria.  Skin: Negative.  Negative for rash.  Neurological: Negative.  Negative for dizziness and headaches.  All other systems reviewed and are negative.  Today's Vitals   05/13/21 1306  BP: (!) 164/80  Pulse: (!) 102  Temp: 98.2 F (36.8 C)  TempSrc: Oral  SpO2: 98%  Weight: 153 lb (69.4 kg)  Height: 5\' 10"  (1.778 m)   Body mass index is 21.95 kg/m. Wt Readings from Last 3 Encounters:  05/13/21 153 lb (69.4 kg)  12/02/20 155 lb 9.6 oz (70.6 kg)  11/05/20 157 lb (71.2 kg)    Physical Exam Vitals reviewed.  Constitutional:      Appearance: Normal appearance.  HENT:     Head: Normocephalic.     Mouth/Throat:     Mouth: Mucous membranes are  moist.     Pharynx: Oropharynx is clear.  Eyes:     Extraocular Movements: Extraocular movements intact.     Conjunctiva/sclera: Conjunctivae normal.     Pupils: Pupils are equal, round, and reactive to light.  Cardiovascular:     Rate and Rhythm: Normal rate and regular rhythm.     Pulses: Normal pulses.     Heart sounds: Normal heart sounds.  Pulmonary:     Effort: Pulmonary effort is normal.     Breath sounds: Normal breath sounds.  Musculoskeletal:     Cervical back: Normal range of motion and neck supple. No tenderness.  Lymphadenopathy:     Cervical: No cervical adenopathy.  Skin:    General: Skin is warm and dry.  Neurological:     General: No focal deficit present.     Mental Status: He is alert and oriented to person, place, and time.  Psychiatric:        Mood and Affect: Mood normal.        Behavior: Behavior normal.     ASSESSMENT & PLAN: Problem List Items Addressed This Visit       Cardiovascular and Mediastinum   Essential hypertension - Primary    Uncontrolled hypertension.  Continue amlodipine 10 mg daily Presently on lisinopril and hydrochlorothiazide.  Will change to Hyzaar 100--12.5 mg daily. Follow-up in 3 months. Lab work done today.       Relevant Medications   losartan-hydrochlorothiazide (HYZAAR) 100-12.5 MG tablet   Other Relevant Orders   Hemoglobin A1c   Comprehensive metabolic panel     Nervous and Auditory   Mild dementia without behavioral disturbance, psychotic disturbance, mood disturbance, or anxiety    Stable. Will follow-up with neurologist.      Relevant Orders   Hemoglobin A1c     Other   Vitamin B12 deficiency    Repeat vitamin B12 level done today.  Continue daily supplementation.      Relevant Medications   vitamin B-12 (CYANOCOBALAMIN) 1000 MCG tablet   Other Relevant Orders   Vitamin B12   Hemoglobin A1c   Memory loss   Relevant Orders   Hemoglobin A1c   Patient Instructions  Hypertension, Adult High  blood pressure (hypertension) is when the force of blood pumping through the arteries is too strong. The arteries are the blood vessels that carry blood from the heart throughout the body. Hypertension forces the heart to work harder to pump blood and may cause arteries to become narrow or stiff. Untreated or uncontrolled hypertension can cause a heart attack, heart failure, a stroke, kidney disease, and other problems. A blood pressure reading consists of a higher number over a lower number. Ideally, your blood pressure should be below 120/80. The first ("top") number is called the systolic pressure. It is a measure of the pressure in your arteries as your heart beats. The second ("bottom") number is called the diastolic pressure. It is a measure of the pressure in your arteries as the heart relaxes. What are the causes? The exact cause of this condition is not known. There are some conditions that result in or are related to high blood pressure. What increases the risk? Some risk factors for high blood pressure are under your control. The following factors may make you more likely to develop this condition: Smoking. Having type 2 diabetes mellitus, high cholesterol, or both. Not getting enough exercise or physical activity. Being overweight. Having too much fat, sugar, calories, or salt (sodium) in your diet. Drinking too much alcohol. Some risk factors for high blood pressure may be difficult or impossible to change. Some of these factors include: Having chronic kidney disease. Having a family history of high blood pressure. Age. Risk increases with age. Race. You may be at higher risk if you are African American. Gender. Men are at higher risk than women before age 54. After age 64, women are at higher risk than men. Having obstructive sleep apnea. Stress. What are the signs or symptoms? High blood pressure may not cause symptoms. Very high blood pressure (hypertensive crisis) may  cause: Headache. Anxiety. Shortness of breath. Nosebleed. Nausea and vomiting. Vision changes. Severe chest pain. Seizures. How is this diagnosed? This condition is diagnosed by measuring your blood pressure while you are seated, with your arm resting on a flat surface, your legs uncrossed, and your feet flat on the floor. The cuff of the blood pressure monitor will be placed directly against the skin of your upper arm at the level of your heart. It should be measured at least twice using the same arm. Certain conditions can cause a difference in blood pressure between your right and left arms. Certain factors can cause blood pressure readings to be lower or higher than normal for a short period of time: When your blood pressure is higher when you are in a health care provider's office than when you are at home, this is called white coat hypertension. Most people with this condition do not need medicines. When your blood pressure is higher at home than when you are in a health care provider's office, this is called masked hypertension. Most people with this condition may need medicines to control blood pressure. If you have a high blood pressure reading during one visit or you have normal blood pressure with other risk factors, you may be asked to: Return on a different day to have your blood pressure checked again. Monitor your blood pressure at home for 1 week or longer. If you are diagnosed with hypertension, you may have other blood or imaging tests to help your health care provider understand your overall risk for other conditions. How is this treated? This condition is treated by  making healthy lifestyle changes, such as eating healthy foods, exercising more, and reducing your alcohol intake. Your health care provider may prescribe medicine if lifestyle changes are not enough to get your blood pressure under control, and if: Your systolic blood pressure is above 130. Your diastolic blood  pressure is above 80. Your personal target blood pressure may vary depending on your medical conditions, your age, and other factors. Follow these instructions at home: Eating and drinking  Eat a diet that is high in fiber and potassium, and low in sodium, added sugar, and fat. An example eating plan is called the DASH (Dietary Approaches to Stop Hypertension) diet. To eat this way: Eat plenty of fresh fruits and vegetables. Try to fill one half of your plate at each meal with fruits and vegetables. Eat whole grains, such as whole-wheat pasta, brown rice, or whole-grain bread. Fill about one fourth of your plate with whole grains. Eat or drink low-fat dairy products, such as skim milk or low-fat yogurt. Avoid fatty cuts of meat, processed or cured meats, and poultry with skin. Fill about one fourth of your plate with lean proteins, such as fish, chicken without skin, beans, eggs, or tofu. Avoid pre-made and processed foods. These tend to be higher in sodium, added sugar, and fat. Reduce your daily sodium intake. Most people with hypertension should eat less than 1,500 mg of sodium a day. Do not drink alcohol if: Your health care provider tells you not to drink. You are pregnant, may be pregnant, or are planning to become pregnant. If you drink alcohol: Limit how much you use to: 0-1 drink a day for women. 0-2 drinks a day for men. Be aware of how much alcohol is in your drink. In the [place] one drink equals one 12 oz bottle of beer (355 mL), one 5 oz glass of wine (148 mL), or one 1 oz glass of hard liquor (44 mL). Lifestyle  Work with your health care provider to maintain a healthy body weight or to lose weight. Ask what an ideal weight is for you. Get at least 30 minutes of exercise most days of the week. Activities may include walking, swimming, or biking. Include exercise to strengthen your muscles (resistance exercise), such as Pilates or lifting weights, as part of your weekly exercise  routine. Try to do these types of exercises for 30 minutes at least 3 days a week. Do not use any products that contain nicotine or tobacco, such as cigarettes, e-cigarettes, and chewing tobacco. If you need help quitting, ask your health care provider. Monitor your blood pressure at home as told by your health care provider. Keep all follow-up visits as told by your health care provider. This is important. Medicines Take over-the-counter and prescription medicines only as told by your health care provider. Follow directions carefully. Blood pressure medicines must be taken as prescribed. Do not skip doses of blood pressure medicine. Doing this puts you at risk for problems and can make the medicine less effective. Ask your health care provider about side effects or reactions to medicines that you should watch for. Contact a health care provider if you: Think you are having a reaction to a medicine you are taking. Have headaches that keep coming back (recurring). Feel dizzy. Have swelling in your ankles. Have trouble with your vision. Get help right away if you: Develop a severe headache or confusion. Have unusual weakness or numbness. Feel faint. Have severe pain in your chest or abdomen. Vomit repeatedly. Have  trouble breathing. Summary Hypertension is when the force of blood pumping through your arteries is too strong. If this condition is not controlled, it may put you at risk for serious complications. Your personal target blood pressure may vary depending on your medical conditions, your age, and other factors. For most people, a normal blood pressure is less than 120/80. Hypertension is treated with lifestyle changes, medicines, or a combination of both. Lifestyle changes include losing weight, eating a healthy, low-sodium diet, exercising more, and limiting alcohol. This information is not intended to replace advice given to you by your health care provider. Make sure you discuss any  questions you have with your health care provider. Document Revised: 02/08/2018 Document Reviewed: 02/08/2018 Elsevier Patient Education  2022 [place], MD [facility]

## 2021-05-13 NOTE — Assessment & Plan Note (Addendum)
Uncontrolled hypertension.  Continue amlodipine 10 mg daily Presently on lisinopril and hydrochlorothiazide.  Will change to Hyzaar 100--12.5 mg daily. Follow-up in 3 months. Lab work done today.

## 2021-05-13 NOTE — Assessment & Plan Note (Signed)
Stable. Will follow-up with neurologist.

## 2021-05-13 NOTE — Assessment & Plan Note (Signed)
Repeat vitamin B12 level done today.  Continue daily supplementation.

## 2021-06-03 ENCOUNTER — Other Ambulatory Visit: Payer: Self-pay | Admitting: Emergency Medicine

## 2021-06-04 ENCOUNTER — Encounter: Payer: Self-pay | Admitting: Adult Health

## 2021-06-04 ENCOUNTER — Ambulatory Visit: Payer: Medicare Other | Admitting: Adult Health

## 2021-06-04 VITALS — BP 131/74 | HR 96 | Ht 70.0 in | Wt 155.2 lb

## 2021-06-04 DIAGNOSIS — R413 Other amnesia: Secondary | ICD-10-CM | POA: Diagnosis not present

## 2021-06-04 DIAGNOSIS — E538 Deficiency of other specified B group vitamins: Secondary | ICD-10-CM

## 2021-06-04 NOTE — Patient Instructions (Signed)
Your Plan:  Continue to monitor symptoms If your symptoms worsen or you develop new symptoms please let us know.   Thank you for coming to see us at [facility]. I hope we have been able to provide you high quality care today.  You may receive a patient satisfaction survey over the next few weeks. We would appreciate your feedback and comments so that we may continue to improve ourselves and the health of our patients.   

## 2021-06-04 NOTE — Progress Notes (Signed)
PATIENT: Martin Edwards DOB: 03-21-1946  REASON FOR VISIT: follow up HISTORY FROM: patient   HISTORY OF PRESENT ILLNESS: Today 06/04/21:  Martin Edwards is a 75 year old male with a history of traumatic brain injury, memory disturbance and seizure events.  He returns today for follow-up.  The patient has not had any seizure-like episodes.  Not currently on any medication.  Dr. 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  He is not on any medication at this time.  Patient notes that his behavior has remained stable.  His son states that there was been only 1 occasion that he switch his insurance abruptly.  He lives at home alone.  He is able to complete all ADLs independently.  He does operate a motor vehicle without difficulty.  Does report that he only drives to familiar places.  Returns today for an evaluation.  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  The patient was seen previously through this office for the seizures and he was eventually able to come off of his seizure medications and is doing well.  In 2019, he had an infection of the bone flap and had to have this removed, the patient claims that since then he has had some increased problems with memory.  He has had troubles with memory since the original head trauma that occurred on 11 Nov 2014.  He particularly noted problems with word finding, but this issue has gradually worsened over time.  He has had short-term memory issues, but he has not altered any activities of daily living.  He still operates a motor vehicle without difficulty, he is keeping up with his own medications and appointments.  He does his finances.  He currently lives alone.  He claims that he sleeps fairly well and has a good energy level.  He denies any numbness or weakness of extremities, he denies headaches or  dizziness.  His primary care physician checked blood work recently that showed evidence of a low vitamin B12 level, he has not yet gone on any supplementation.  He denies any depression problems.  He comes to this office for an evaluation.  REVIEW OF SYSTEMS: Out of a complete 14 system review of symptoms, the patient complains only of the following symptoms, and all other reviewed systems are negative.  ALLERGIES: Allergies  Allergen Reactions   Other Rash    Needs Hypoallergenic Sheets/ Laundry Cart had questionable allergic reaction to detergent last time he was here.    HOME MEDICATIONS: Outpatient Medications Prior to Visit  Medication Sig Dispense Refill   amLODipine (NORVASC) 10 MG tablet Take 1 tablet (10 mg total) by mouth daily. 90 tablet 3   atorvastatin (LIPITOR) 40 MG tablet Take 1 tablet (40 mg total) by mouth daily. 90 tablet 3   famotidine (PEPCID) 20 MG tablet Take 1 tablet (20 mg total) by mouth 2 (two) times daily as needed for heartburn or indigestion. 60 tablet 3   losartan-hydrochlorothiazide (HYZAAR) 100-12.5 MG tablet Take 1 tablet by mouth daily. 90 tablet 3   vitamin B-12 (CYANOCOBALAMIN) 1000 MCG tablet Take 1 tablet (1,000 mcg total) by mouth daily. 90 tablet 3   No facility-administered medications prior to visit.    PAST MEDICAL HISTORY: Past Medical History:  Diagnosis Date   Basal cell carcinoma of face X 2   Brow ptosis  GERD (gastroesophageal reflux disease)    History of kidney stones    Hyperlipidemia    Hypertension    Prostate cancer ([place]) 1999   Seizures ([place]) 11/2014   brain trauma -hematoma   Small bowel obstruction due to adhesions ([place]) 01/30/2014   Subdural hematoma    Vitamin B12 deficiency 12/02/2020    PAST SURGICAL HISTORY: Past Surgical History:  Procedure Laterality Date   APPLICATION OF A-CELL OF HEAD/NECK N/A 08/29/2017   Procedure: APPLICATION OF A-CELL;  Surgeon: Wallace Going, DO;  Location: [place];  Service:  Plastics;  Laterality: N/A;   BASAL CELL CARCINOMA EXCISION  X 2   "off my face both times"   [place]  10/2010   "took out 8 inches of colon; wasn't cancer"   CRANIOTOMY Right 11/11/2014   Procedure: CRANIOTOMY HEMATOMA EVACUATION SUBDURAL;  Surgeon: Leeroy Cha, MD;  Location: [place] [facility];  Service: Neurosurgery;  Laterality: Right;   CRANIOTOMY Right 11/13/2014   Procedure: Removal bone flap, insertion into the abdominal wall;  Surgeon: Leeroy Cha, MD;  Location: [place] [facility];  Service: Neurosurgery;  Laterality: Right;   CRANIOTOMY N/A 03/07/2015   Procedure: Cranioplasty using Bone flap in abdomen;  Surgeon: Leeroy Cha, MD;  Location: [place] [facility];  Service: Neurosurgery;  Laterality: N/A;  Cranioplasty using Bone flap in abdomen   CRANIOTOMY N/A 08/29/2017   Procedure: CRANIECTOMY AND DEBRIDEMENT;  Surgeon: Jovita Gamma, MD;  Location: [place];  Service: Neurosurgery;  Laterality: N/A;  CRANIECTOMY AND DEBRIDEMENT   EXCISION MASS NECK Right 04/14/2016   Procedure: EXCISION BIOPSY OF RIGHT NECK LYMPH NODE;  Surgeon: Jerrell Belfast, MD;  Location: [place];  Service: ENT;  Laterality: Right;   EXCISION MELANOMA WITH SENTINEL LYMPH NODE BIOPSY Right 04/14/2016   Procedure: WIDE EXCISION MELANOMA RIGHT SHOULDER WITH SENTINEL  NODE MAPPING;  Surgeon: Autumn Messing III, MD;  Location: [place];  Service: General;  Laterality: Right;   PROSTATECTOMY  05/20/1998   SCALP LACERATION REPAIR N/A 08/29/2017   Procedure: SCALP FLAP TO COVER DEFECT;  Surgeon: Wallace Going, DO;  Location: [place];  Service: Plastics;  Laterality: N/A;    FAMILY HISTORY: Family History  Problem Relation Age of Onset   Cancer Father        Lung    SOCIAL HISTORY: Social History   Socioeconomic History   Marital status: Divorced    Spouse name: Not on file   Number of children: 2   Years of education: 12   Highest education level: Not on file  Occupational History   Occupation: retired   Tobacco Use   Smoking status: Former   Smokeless tobacco: Never  Scientific laboratory technician Use: Never used  Substance and Sexual Activity   Alcohol use: Yes    Alcohol/week: 0.0 standard drinks    Comment: 01/30/2014 "maybe a beer twice/yr"   Drug use: No   Sexual activity: Not Currently  Other Topics Concern   Not on file  Social History Narrative   Exercise: 2-3 times a wk   Lives alone   Patient drinks 2 cups of caffeine daily.   Patient is right handed.   Social Determinants of Health   Financial Resource Strain: Not on file  Food Insecurity: Not on file  Transportation Needs: Not on file  Physical Activity: Not on file  Stress: Not on file  Social Connections: Not on file  Intimate Partner Violence: Not on file  PHYSICAL EXAM  Vitals:   06/04/21 1301  BP: (!) 141/74  Pulse: (!) 107  Weight: 155 lb 3.2 oz (70.4 kg)  Height: 5\' 10"  (1.778 m)   Body mass index is 22.27 kg/m.  MMSE - Mini Mental State Exam 06/04/2021 12/02/2020  Orientation to time 5 4  Orientation to Place 4 5  Registration 3 3  Attention/ Calculation 3 4  Recall 2 2  Language- name 2 objects 2 1  Language- repeat 1 0  Language- follow 3 step command 2 2  Language- read & follow direction 1 0  Write a sentence 1 1  Copy design 0 0  Total score 24 22     Generalized: Well developed, in no acute distress   Neurological examination  Mentation: Alert oriented to time, place, history taking. Follows all commands speech and language fluent Cranial nerve II-XII: Pupils were equal round reactive to light. Extraocular movements were full, visual field were full on confrontational test. Facial sensation and strength were normal. Uvula tongue midline. Head turning and shoulder shrug  were normal and symmetric. Motor: The motor testing reveals 5 over 5 strength of all 4 extremities. Good symmetric motor tone is noted throughout.  Sensory: Sensory testing is intact to soft touch on all 4  extremities. No evidence of extinction is noted.  Coordination: Cerebellar testing reveals good finger-nose-finger and heel-to-shin bilaterally.  Gait and station: Gait is normal.  Reflexes: Deep tendon reflexes are symmetric and normal bilaterally.   DIAGNOSTIC DATA (LABS, IMAGING, TESTING) - I reviewed patient records, labs, notes, testing and imaging myself where available.  Lab Results  Component Value Date   WBC 6.1 11/05/2020   HGB 15.3 11/05/2020   HCT 45.1 11/05/2020   MCV 91.4 11/05/2020   PLT 182.0 11/05/2020      Component Value Date/Time   NA 136 05/13/2021 1341   NA 137 04/30/2020 1107   K 4.1 05/13/2021 1341   CL 99 05/13/2021 1341   CO2 30 05/13/2021 1341   GLUCOSE 115 (H) 05/13/2021 1341   BUN 10 05/13/2021 1341   BUN 10 04/30/2020 1107   CREATININE 1.03 05/13/2021 1341   CREATININE 0.97 01/08/2016 1010   CALCIUM 9.7 05/13/2021 1341   PROT 7.9 05/13/2021 1341   PROT 8.0 04/30/2020 1107   ALBUMIN 4.6 05/13/2021 1341   ALBUMIN 5.0 (H) 04/30/2020 1107   AST 17 05/13/2021 1341   ALT 15 05/13/2021 1341   ALKPHOS 72 05/13/2021 1341   BILITOT 1.1 05/13/2021 1341   BILITOT 0.9 04/30/2020 1107   GFRNONAA 69 04/30/2020 1107   GFRNONAA 79 01/08/2016 1010   GFRAA 80 04/30/2020 1107   GFRAA >89 01/08/2016 1010   Lab Results  Component Value Date   CHOL 132 11/05/2020   HDL 56.40 11/05/2020   LDLCALC 59 11/05/2020   TRIG 81.0 11/05/2020   CHOLHDL 2 11/05/2020   Lab Results  Component Value Date   HGBA1C 6.2 05/13/2021   Lab Results  Component Value Date   VITAMINB12 140 (L) 05/13/2021   Lab Results  Component Value Date   TSH 1.38 11/05/2020      ASSESSMENT AND PLAN 75 y.o. year old male  has a past medical history of Basal cell carcinoma of face (X 2), Brow ptosis, GERD (gastroesophageal reflux disease), History of kidney stones, Hyperlipidemia, Hypertension, Prostate cancer ([place]) (1999), Seizures ([place]) (11/2014), Small bowel obstruction due to  adhesions ([place]) (01/30/2014), Subdural hematoma, and Vitamin B12 deficiency (12/02/2020). here with:  1.  Traumatic brain injury 2.  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  Vitamin B12 deficiency  -- MMSE stable -- We will continue monitoring symptoms for now --Encouraged patient to take vitamin B12 supplement as prescribed by his PCP --We will follow-up in 6 months or sooner if needed     Ward Givens, MSN, NP-C 06/04/2021, 1:01 PM [facility] [address], [place], [place] [address] [phone]

## 2021-07-23 DIAGNOSIS — L570 Actinic keratosis: Secondary | ICD-10-CM | POA: Diagnosis not present

## 2021-07-23 DIAGNOSIS — L218 Other seborrheic dermatitis: Secondary | ICD-10-CM | POA: Diagnosis not present

## 2021-07-23 DIAGNOSIS — L728 Other follicular cysts of the skin and subcutaneous tissue: Secondary | ICD-10-CM | POA: Diagnosis not present

## 2021-07-23 DIAGNOSIS — D492 Neoplasm of unspecified behavior of bone, soft tissue, and skin: Secondary | ICD-10-CM | POA: Diagnosis not present

## 2021-07-23 DIAGNOSIS — Z85820 Personal history of malignant melanoma of skin: Secondary | ICD-10-CM | POA: Diagnosis not present

## 2021-07-23 DIAGNOSIS — C44529 Squamous cell carcinoma of skin of other part of trunk: Secondary | ICD-10-CM | POA: Diagnosis not present

## 2021-07-23 DIAGNOSIS — D225 Melanocytic nevi of trunk: Secondary | ICD-10-CM | POA: Diagnosis not present

## 2021-07-23 DIAGNOSIS — L814 Other melanin hyperpigmentation: Secondary | ICD-10-CM | POA: Diagnosis not present

## 2021-07-23 DIAGNOSIS — Z85828 Personal history of other malignant neoplasm of skin: Secondary | ICD-10-CM | POA: Diagnosis not present

## 2021-07-23 DIAGNOSIS — L821 Other seborrheic keratosis: Secondary | ICD-10-CM | POA: Diagnosis not present

## 2021-07-23 DIAGNOSIS — Z86007 Personal history of in-situ neoplasm of skin: Secondary | ICD-10-CM | POA: Diagnosis not present

## 2021-07-23 DIAGNOSIS — Z08 Encounter for follow-up examination after completed treatment for malignant neoplasm: Secondary | ICD-10-CM | POA: Diagnosis not present

## 2021-08-21 DIAGNOSIS — H11043 Peripheral pterygium, stationary, bilateral: Secondary | ICD-10-CM | POA: Diagnosis not present

## 2021-08-21 DIAGNOSIS — H40013 Open angle with borderline findings, low risk, bilateral: Secondary | ICD-10-CM | POA: Diagnosis not present

## 2021-08-21 DIAGNOSIS — H2513 Age-related nuclear cataract, bilateral: Secondary | ICD-10-CM | POA: Diagnosis not present

## 2021-09-25 ENCOUNTER — Ambulatory Visit (INDEPENDENT_AMBULATORY_CARE_PROVIDER_SITE_OTHER): Payer: Medicare Other

## 2021-09-25 DIAGNOSIS — Z0000 Encounter for general adult medical examination without abnormal findings: Secondary | ICD-10-CM

## 2021-09-25 NOTE — Progress Notes (Signed)
? ?Subjective:  ? Martin Edwards is a 76 y.o. male who presents for an Initial Medicare Annual Wellness Visit. ? ?I connected with Martin Edwards today by telephone and verified that I am speaking with the correct person using two identifiers. ?Location patient: home ?Location provider: work ?Persons participating in the virtual visit: patient, provider. ?  ?I discussed the limitations, risks, security and privacy concerns of performing an evaluation and management service by telephone and the availability of in person appointments. I also discussed with the patient that there may be a patient responsible charge related to this service. The patient expressed understanding and verbally consented to this telephonic visit.  ?  ?Interactive audio and video telecommunications were attempted between this provider and patient, however failed, due to patient having technical difficulties OR patient did not have access to video capability.  We continued and completed visit with audio only. ? ?  ?Review of Systems    ? ?Cardiac Risk Factors include: advanced age (>30mn, >>26women);dyslipidemia;male gender;hypertension ? ?   ?Objective:  ?  ?Today's Vitals  ? ?There is no height or weight on file to calculate BMI. ? ? ?  09/25/2021  ?  9:25 AM 04/12/2019  ?  9:36 AM 04/21/2018  ? 10:34 AM 08/29/2017  ?  9:47 AM 08/24/2017  ?  2:40 PM 04/07/2016  ?  2:35 PM 03/07/2015  ?  4:00 PM  ?Advanced Directives  ?Does Patient Have a Medical Advance Directive? Yes No No Yes Yes Yes Yes  ?Type of AParamed[place]of [place] Walk[place] will   [place] River[place] will  Living will;Healthcare Power of [place] will  ?Does patient want to make changes to medical advance directive?    No - Patient declined  No - Patient declined No - Patient declined  ?Copy of [place] Chart? No - copy requested   No - copy requested  No - copy requested No - copy requested   ?Would patient like information on creating a medical advance directive?  Yes (Inpatient - patient defers creating a medical advance directive at this time - Information given) No - Patient declined      ? ? ?Current Medications (verified) ?Outpatient Encounter Medications as of 09/25/2021  ?Medication Sig  ? amLODipine (NORVASC) 10 MG tablet Take 1 tablet (10 mg total) by mouth daily.  ? atorvastatin (LIPITOR) 40 MG tablet Take 1 tablet (40 mg total) by mouth daily.  ? famotidine (PEPCID) 20 MG tablet Take 1 tablet (20 mg total) by mouth 2 (two) times daily as needed for heartburn or indigestion.  ? losartan-hydrochlorothiazide (HYZAAR) 100-12.5 MG tablet Take 1 tablet by mouth daily.  ? ?No facility-administered encounter medications on file as of 09/25/2021.  ? ? ?Allergies (verified) ?Other  ? ?History: ?Past Medical History:  ?Diagnosis Date  ? Basal cell carcinoma of face X 2  ? Brow ptosis   ? GERD (gastroesophageal reflux disease)   ? History of kidney stones   ? Hyperlipidemia   ? Hypertension   ? [place]  ? [place] ([place] 11[place]  ? brain trauma -hematoma  ? [place] bowel [place] due to [place]ions ([place] 01/30/2014  ? Subdural hematoma ([facility])   ? Vitamin B12 deficiency 12/02/2020  ? ?Past Surgical History:  ?Procedure Laterality Date  ? APPLICATION OF A-CELL OF HEAD/NECK N/A 08/29/2017  ? Procedure: APPLICATION OF A-CELL;  Surgeon: DWallace Going DO;  [place]  Service: Clinical cytogeneticist;  Laterality: N/A;  ? BASAL CELL CARCINOMA EXCISION  X 2  ? "off my face both times"  ? BRAIN SURGERY    ? COLECTOMY  10/2010  ? "took out 8 inches of colon; wasn't cancer"  ? CRANIOTOMY Right 11/11/2014  ? Procedure: CRANIOTOMY HEMATOMA EVACUATION SUBDURAL;  Surgeon: Leeroy Cha, MD;  Location: [place] [facility];  Service: Neurosurgery;  Laterality: Right;  ? CRANIOTOMY Right 11/13/2014  ? Procedure: Removal bone flap, insertion into the abdominal wall;  Surgeon: Leeroy Cha, MD;  Location: [place] [facility];   Service: Neurosurgery;  Laterality: Right;  ? CRANIOTOMY N/A 03/07/2015  ? Procedure: Cranioplasty using Bone flap in abdomen;  Surgeon: Leeroy Cha, MD;  Location: [place] [facility];  Service: Neurosurgery;  Laterality: N/A;  Cranioplasty using Bone flap in abdomen  ? CRANIOTOMY N/A 08/29/2017  ? Procedure: CRANIECTOMY AND DEBRIDEMENT;  Surgeon: Jovita Gamma, MD;  Location: [place];  Service: Neurosurgery;  Laterality: N/A;  CRANIECTOMY AND DEBRIDEMENT  ? EXCISION MASS NECK Right 04/14/2016  ? Procedure: EXCISION BIOPSY OF RIGHT NECK LYMPH NODE;  Surgeon: Jerrell Belfast, MD;  Location: [place];  Service: ENT;  Laterality: Right;  ? EXCISION MELANOMA WITH SENTINEL LYMPH NODE BIOPSY Right 04/14/2016  ? Procedure: WIDE EXCISION MELANOMA RIGHT SHOULDER WITH SENTINEL  NODE MAPPING;  Surgeon: Autumn Messing III, MD;  Location: [place];  Service: General;  Laterality: Right;  ? PROSTATECTOMY  05/20/1998  ? SCALP LACERATION REPAIR N/A 08/29/2017  ? Procedure: SCALP FLAP TO COVER DEFECT;  Surgeon: Wallace Going, DO;  Location: [place];  Service: Plastics;  Laterality: N/A;  ? ?Family History  ?Problem Relation Age of Onset  ? Cancer Father   ?     Lung  ? ?Social History  ? ?Socioeconomic History  ? Marital status: Divorced  ?  Spouse name: Not on file  ? Number of children: 2  ? Years of education: 76  ? Highest education level: Not on file  ?Occupational History  ? Occupation: retired  ?Tobacco Use  ? Smoking status: Former  ? Smokeless tobacco: Never  ?Vaping Use  ? Vaping Use: Never used  ?Substance and Sexual Activity  ? Alcohol use: Yes  ?  Alcohol/week: 0.0 standard drinks  ?  Comment: 01/30/2014 "maybe a beer twice/yr"  ? Drug use: No  ? Sexual activity: Not Currently  ?Other Topics Concern  ? Not on file  ?Social History Narrative  ? Exercise: 2-3 times a wk  ? Lives alone  ? Patient drinks 2 cups of caffeine daily.  ? Patient is right handed.  ? ?Social Determinants of Health  ? ?Financial Resource Strain: Low Risk   ?  Difficulty of Paying Living Expenses: Not hard at all  ?Food Insecurity: No Food Insecurity  ? Worried About Charity fundraiser in the Last Year: Never true  ? Ran Out of Food in the Last Year: Never true  ?Transportation Needs: No Transportation Needs  ? Lack of Transportation (Medical): No  ? Lack of Transportation (Non-Medical): No  ?Physical Activity: Sufficiently Active  ? Days of Exercise per Week: 5 days  ? Minutes of Exercise per Session: 30 min  ?Stress: No Stress Concern Present  ? Feeling of Stress : Not at all  ?Social Connections: Moderately Isolated  ? Frequency of Communication with Friends and Family: Twice a week  ? Frequency of Social Gatherings with Friends and Family: Twice a week  ? Attends Religious Services: 1 to 4  times per year  ? Active Member of Clubs or Organizations: No  ? Attends Archivist Meetings: Never  ? Marital Status: Divorced  ? ? ?Tobacco Counseling ?Counseling given: Not Answered ? ? ?Clinical Intake: ? ?Pre-visit preparation completed: Yes ? ?Pain : No/denies pain ? ?  ? ?Nutritional Risks: None ?Diabetes: No ? ?How often do you need to have someone help you when you read instructions, pamphlets, or other written materials from your doctor or pharmacy?: 1 - Never ?What is the last grade level you completed in school?: High School ? ?Diabetic?no  ? ?Interpreter Needed?: No ? ?Information entered by :: [email or web address] ? ? ?Activities of Daily Living ? ?  09/25/2021  ?  9:28 AM  ?In your present state of health, do you have any difficulty performing the following activities:  ?Hearing? 0  ?Vision? 0  ?Difficulty concentrating or making decisions? 0  ?Walking or climbing stairs? 0  ?Dressing or bathing? 0  ?Doing errands, shopping? 0  ?Preparing Food and eating ? N  ?Using the Toilet? N  ?In the past six months, have you accidently leaked urine? N  ?Do you have problems with loss of bowel control? N  ?Managing your Medications? N  ?Managing your Finances? N  ?Housekeeping  or managing your Housekeeping? N  ? ? ?Patient Care Team: ?Horald Pollen, MD as PCP - General (Internal Medicine) ?Jovita Gamma, MD as Consulting Physician (Neurosurgery) ? ?Indicate any recent M

## 2021-09-25 NOTE — Patient Instructions (Signed)
Martin Edwards , ?Thank you for taking time to come for your Medicare Wellness Visit. I appreciate your ongoing commitment to your health goals. Please review the following plan we discussed and let me know if I can assist you in the future.  ? ?Screening recommendations/referrals: ?Colonoscopy: no longer required  ?Recommended yearly ophthalmology/optometry visit for glaucoma screening and checkup ?Recommended yearly dental visit for hygiene and checkup ? ?Vaccinations: ?Influenza vaccine: declined  ?Pneumococcal vaccine: completed  ?Tdap vaccine: due  ?Shingles vaccine: will consider    ? ?Advanced directives: none  ? ?Conditions/risks identified: none  ? ?Next appointment: none  ? ?Preventive Care 29 Years and Older, Male ?Preventive care refers to lifestyle choices and visits with your health care provider that can promote health and wellness. ?What does preventive care include? ?A yearly physical exam. This is also called an annual well check. ?Dental exams once or twice a year. ?Routine eye exams. Ask your health care provider how often you should have your eyes checked. ?Personal lifestyle choices, including: ?Daily care of your teeth and gums. ?Regular physical activity. ?Eating a healthy diet. ?Avoiding tobacco and drug use. ?Limiting alcohol use. ?Practicing safe sex. ?Taking low doses of aspirin every day. ?Taking vitamin and mineral supplements as recommended by your health care provider. ?What happens during an annual well check? ?The services and screenings done by your health care provider during your annual well check will depend on your age, overall health, lifestyle risk factors, and family history of disease. ?Counseling  ?Your health care provider may ask you questions about your: ?Alcohol use. ?Tobacco use. ?Drug use. ?Emotional well-being. ?Home and relationship well-being. ?Sexual activity. ?Eating habits. ?History of falls. ?Memory and ability to understand (cognition). ?Work and work  Statistician. ?Screening  ?You may have the following tests or measurements: ?Height, weight, and BMI. ?Blood pressure. ?Lipid and cholesterol levels. These may be checked every 5 years, or more frequently if you are over 57 years old. ?Skin check. ?Lung cancer screening. You may have this screening every year starting at age 12 if you have a 30-pack-year history of smoking and currently smoke or have quit within the past 15 years. ?Fecal occult blood test (FOBT) of the stool. You may have this test every year starting at age 33. ?Flexible sigmoidoscopy or colonoscopy. You may have a sigmoidoscopy every 5 years or a colonoscopy every 10 years starting at age 13. ?Prostate cancer screening. Recommendations will vary depending on your family history and other risks. ?Hepatitis C blood test. ?Hepatitis B blood test. ?Sexually transmitted disease (STD) testing. ?Diabetes screening. This is done by checking your blood sugar (glucose) after you have not eaten for a while (fasting). You may have this done every 1-3 years. ?Abdominal aortic aneurysm (AAA) screening. You may need this if you are a current or former smoker. ?Osteoporosis. You may be screened starting at age 60 if you are at high risk. ?Talk with your health care provider about your test results, treatment options, and if necessary, the need for more tests. ?Vaccines  ?Your health care provider may recommend certain vaccines, such as: ?Influenza vaccine. This is recommended every year. ?Tetanus, diphtheria, and acellular pertussis (Tdap, Td) vaccine. You may need a Td booster every 10 years. ?Zoster vaccine. You may need this after age 36. ?Pneumococcal 13-valent conjugate (PCV13) vaccine. One dose is recommended after age 63. ?Pneumococcal polysaccharide (PPSV23) vaccine. One dose is recommended after age 33. ?Talk to your health care provider about which screenings and vaccines you need  and how often you need them. ?This information is not intended to replace  advice given to you by your health care provider. Make sure you discuss any questions you have with your health care provider. ?Document Released: 06/27/2015 Document Revised: 02/18/2016 Document Reviewed: 04/01/2015 ?[employer] Interactive Patient Education ? 2017 [employer]. ? ?Fall Prevention in the Home ?Falls can cause injuries. They can happen to people of all ages. There are many things you can do to make your home safe and to help prevent falls. ?What can I do on the outside of my home? ?Regularly fix the edges of walkways and driveways and fix any cracks. ?Remove anything that might make you trip as you walk through a door, such as a raised step or threshold. ?Trim any bushes or trees on the path to your home. ?Use bright outdoor lighting. ?Clear any walking paths of anything that might make someone trip, such as rocks or tools. ?Regularly check to see if handrails are loose or broken. Make sure that both sides of any steps have handrails. ?Any raised decks and porches should have guardrails on the edges. ?Have any leaves, snow, or ice cleared regularly. ?Use sand or salt on walking paths during winter. ?Clean up any spills in your garage right away. This includes oil or grease spills. ?What can I do in the bathroom? ?Use night lights. ?Install grab bars by the toilet and in the tub and shower. Do not use towel bars as grab bars. ?Use non-skid mats or decals in the tub or shower. ?If you need to sit down in the shower, use a plastic, non-slip stool. ?Keep the floor dry. Clean up any water that spills on the floor as soon as it happens. ?Remove soap buildup in the tub or shower regularly. ?Attach bath mats securely with double-sided non-slip rug tape. ?Do not have throw rugs and other things on the floor that can make you trip. ?What can I do in the bedroom? ?Use night lights. ?Make sure that you have a light by your bed that is easy to reach. ?Do not use any sheets or blankets that are too big for your bed.  They should not hang down onto the floor. ?Have a firm chair that has side arms. You can use this for support while you get dressed. ?Do not have throw rugs and other things on the floor that can make you trip. ?What can I do in the kitchen? ?Clean up any spills right away. ?Avoid walking on wet floors. ?Keep items that you use a lot in easy-to-reach places. ?If you need to reach something above you, use a strong step stool that has a grab bar. ?Keep electrical cords out of the way. ?Do not use floor polish or wax that makes floors slippery. If you must use wax, use non-skid floor wax. ?Do not have throw rugs and other things on the floor that can make you trip. ?What can I do with my stairs? ?Do not leave any items on the stairs. ?Make sure that there are handrails on both sides of the stairs and use them. Fix handrails that are broken or loose. Make sure that handrails are as long as the stairways. ?Check any carpeting to make sure that it is firmly attached to the stairs. Fix any carpet that is loose or worn. ?Avoid having throw rugs at the top or bottom of the stairs. If you do have throw rugs, attach them to the floor with carpet tape. ?Make sure that you  have a light switch at the top of the stairs and the bottom of the stairs. If you do not have them, ask someone to add them for you. ?What else can I do to help prevent falls? ?Wear shoes that: ?Do not have high heels. ?Have rubber bottoms. ?Are comfortable and fit you well. ?Are closed at the toe. Do not wear sandals. ?If you use a stepladder: ?Make sure that it is fully opened. Do not climb a closed stepladder. ?Make sure that both sides of the stepladder are locked into place. ?Ask someone to hold it for you, if possible. ?Clearly mark and make sure that you can see: ?Any grab bars or handrails. ?First and last steps. ?Where the edge of each step is. ?Use tools that help you move around (mobility aids) if they are needed. These  include: ?Canes. ?Walkers. ?Scooters. ?Crutches. ?Turn on the lights when you go into a dark area. Replace any light bulbs as soon as they burn out. ?Set up your furniture so you have a clear path. Avoid moving your furniture around. ?If

## 2021-09-28 ENCOUNTER — Telehealth: Payer: Self-pay

## 2021-09-28 NOTE — Telephone Encounter (Signed)
Duplicate visit was completed 09/25/2021 ?

## 2021-10-08 ENCOUNTER — Other Ambulatory Visit: Payer: Self-pay | Admitting: Emergency Medicine

## 2021-10-08 DIAGNOSIS — I10 Essential (primary) hypertension: Secondary | ICD-10-CM

## 2021-10-08 DIAGNOSIS — E785 Hyperlipidemia, unspecified: Secondary | ICD-10-CM

## 2021-11-10 ENCOUNTER — Encounter: Payer: Self-pay | Admitting: Emergency Medicine

## 2021-11-10 ENCOUNTER — Ambulatory Visit (INDEPENDENT_AMBULATORY_CARE_PROVIDER_SITE_OTHER): Payer: Medicare Other

## 2021-11-10 ENCOUNTER — Ambulatory Visit (INDEPENDENT_AMBULATORY_CARE_PROVIDER_SITE_OTHER): Payer: Medicare Other | Admitting: Emergency Medicine

## 2021-11-10 VITALS — BP 130/70 | HR 104 | Temp 98.1°F | Ht 70.0 in | Wt 150.0 lb

## 2021-11-10 DIAGNOSIS — R7303 Prediabetes: Secondary | ICD-10-CM | POA: Diagnosis not present

## 2021-11-10 DIAGNOSIS — R634 Abnormal weight loss: Secondary | ICD-10-CM | POA: Insufficient documentation

## 2021-11-10 DIAGNOSIS — R1013 Epigastric pain: Secondary | ICD-10-CM

## 2021-11-10 DIAGNOSIS — I10 Essential (primary) hypertension: Secondary | ICD-10-CM

## 2021-11-10 DIAGNOSIS — E785 Hyperlipidemia, unspecified: Secondary | ICD-10-CM

## 2021-11-10 DIAGNOSIS — F03A0 Unspecified dementia, mild, without behavioral disturbance, psychotic disturbance, mood disturbance, and anxiety: Secondary | ICD-10-CM | POA: Diagnosis not present

## 2021-11-10 DIAGNOSIS — G3109 Other frontotemporal dementia: Secondary | ICD-10-CM | POA: Diagnosis not present

## 2021-11-10 DIAGNOSIS — E538 Deficiency of other specified B group vitamins: Secondary | ICD-10-CM | POA: Diagnosis not present

## 2021-11-10 DIAGNOSIS — Z1211 Encounter for screening for malignant neoplasm of colon: Secondary | ICD-10-CM

## 2021-11-10 DIAGNOSIS — F0280 Dementia in other diseases classified elsewhere without behavioral disturbance: Secondary | ICD-10-CM | POA: Insufficient documentation

## 2021-11-10 LAB — CBC WITH DIFFERENTIAL/PLATELET
Basophils Absolute: 0 10*3/uL (ref 0.0–0.1)
Basophils Relative: 0.3 % (ref 0.0–3.0)
Eosinophils Absolute: 0 10*3/uL (ref 0.0–0.7)
Eosinophils Relative: 0.1 % (ref 0.0–5.0)
HCT: 47 % (ref 39.0–52.0)
Hemoglobin: 15.8 g/dL (ref 13.0–17.0)
Lymphocytes Relative: 17.8 % (ref 12.0–46.0)
Lymphs Abs: 1.4 10*3/uL (ref 0.7–4.0)
MCHC: 33.6 g/dL (ref 30.0–36.0)
MCV: 91.9 fl (ref 78.0–100.0)
Monocytes Absolute: 0.6 10*3/uL (ref 0.1–1.0)
Monocytes Relative: 8.1 % (ref 3.0–12.0)
Neutro Abs: 5.7 10*3/uL (ref 1.4–7.7)
Neutrophils Relative %: 73.7 % (ref 43.0–77.0)
Platelets: 181 10*3/uL (ref 150.0–400.0)
RBC: 5.11 Mil/uL (ref 4.22–5.81)
RDW: 13.6 % (ref 11.5–15.5)
WBC: 7.7 10*3/uL (ref 4.0–10.5)

## 2021-11-10 LAB — COMPREHENSIVE METABOLIC PANEL
ALT: 18 U/L (ref 0–53)
AST: 19 U/L (ref 0–37)
Albumin: 4.8 g/dL (ref 3.5–5.2)
Alkaline Phosphatase: 73 U/L (ref 39–117)
BUN: 10 mg/dL (ref 6–23)
CO2: 30 mEq/L (ref 19–32)
Calcium: 10 mg/dL (ref 8.4–10.5)
Chloride: 97 mEq/L (ref 96–112)
Creatinine, Ser: 0.97 mg/dL (ref 0.40–1.50)
GFR: 75.87 mL/min (ref >60.00)
Glucose, Bld: 126 mg/dL — ABNORMAL HIGH (ref 70–99)
Potassium: 3.7 mEq/L (ref 3.5–5.1)
Sodium: 137 mEq/L (ref 135–145)
Total Bilirubin: 1.4 mg/dL — ABNORMAL HIGH (ref 0.2–1.2)
Total Protein: 8.1 g/dL (ref 6.0–8.3)

## 2021-11-10 LAB — LIPID PANEL
Cholesterol: 135 mg/dL (ref 0–200)
HDL: 60.4 mg/dL (ref >39.00)
LDL Cholesterol: 64 mg/dL (ref 0–99)
NonHDL: 74.96
Total CHOL/HDL Ratio: 2
Triglycerides: 56 mg/dL (ref 0.0–149.0)
VLDL: 11.2 mg/dL (ref 0.0–40.0)

## 2021-11-10 LAB — URINALYSIS, ROUTINE W REFLEX MICROSCOPIC
Bilirubin Urine: NEGATIVE
Ketones, ur: NEGATIVE
Leukocytes,Ua: NEGATIVE
Nitrite: NEGATIVE
Specific Gravity, Urine: 1.02 (ref 1.000–1.030)
Urine Glucose: NEGATIVE
Urobilinogen, UA: 0.2 (ref 0.0–1.0)
pH: 6 (ref 5.0–8.0)

## 2021-11-10 LAB — HEMOGLOBIN A1C: Hgb A1c MFr Bld: 6.1 % (ref 4.6–6.5)

## 2021-11-10 LAB — VITAMIN B12: Vitamin B-12: 170 pg/mL — ABNORMAL LOW (ref 211–911)

## 2021-11-10 LAB — SEDIMENTATION RATE: Sed Rate: 18 mm/hr (ref 0–20)

## 2021-11-10 MED ORDER — FAMOTIDINE 20 MG PO TABS
20.0000 mg | ORAL_TABLET | Freq: Two times a day (BID) | ORAL | 3 refills | Status: DC | PRN
Start: 2021-11-10 — End: 2022-01-11

## 2021-11-10 MED ORDER — ATORVASTATIN CALCIUM 40 MG PO TABS: 40.0000 mg | ORAL_TABLET | Freq: Every day | ORAL | 3 refills | Status: DC

## 2021-11-10 MED ORDER — LOSARTAN POTASSIUM-HCTZ 100-12.5 MG PO TABS: 1.0000 | ORAL_TABLET | Freq: Every day | ORAL | 3 refills | Status: DC

## 2021-11-10 MED ORDER — AMLODIPINE BESYLATE 10 MG PO TABS: 10.0000 mg | ORAL_TABLET | Freq: Every day | ORAL | 3 refills | Status: DC

## 2021-11-10 MED ORDER — PANTOPRAZOLE SODIUM 40 MG PO TBEC: 40.0000 mg | DELAYED_RELEASE_TABLET | Freq: Every day | ORAL | 3 refills | Status: DC

## 2021-11-10 NOTE — Assessment & Plan Note (Signed)
Active.  Pepcid not working.  We will start pantoprazole 40 mg daily. GI referral placed for colonoscopy as well.

## 2021-11-10 NOTE — Assessment & Plan Note (Signed)
Stable.  Sees neurologist on a regular basis.

## 2021-11-10 NOTE — Assessment & Plan Note (Signed)
Differential diagnosis discussed. Needs work-up.

## 2021-11-10 NOTE — Assessment & Plan Note (Signed)
Elevated blood pressure reading in the office but normal readings at home. Continue Hyzaar 100-12.5 mg and amlodipine 10 mg daily.

## 2021-11-10 NOTE — Progress Notes (Signed)
Martin Edwards 76 y.o.   Chief Complaint  Patient presents with   Follow-up   Weight Loss    Concern about weight loss     HISTORY OF PRESENT ILLNESS: This is a 76 y.o. male here for 72-monthfollow-up. History of hypertension.  Normal blood pressure readings at home History of frontotemporal dementia.  Stable. Couple concerns today: Weight loss and vitamin B12 deficiency Patient states he has good appetite.  Not depressed. Overall doing well. No other complaints or medical concerns today. Wt Readings from Last 3 Encounters:  11/10/21 150 lb (68 kg)  06/04/21 155 lb 3.2 oz (70.4 kg)  05/13/21 153 lb (69.4 kg)   BP Readings from Last 3 Encounters:  11/10/21 (!) 160/84  06/04/21 131/74  05/13/21 (!) 164/80     HPI   Prior to Admission medications   Medication Sig Start Date End Date Taking? Authorizing Provider  amLODipine (NORVASC) 10 MG tablet Take 1 tablet (10 mg total) by mouth daily. 11/05/20   SHorald Pollen MD  atorvastatin (LIPITOR) 40 MG tablet Take 1 tablet (40 mg total) by mouth daily. 11/05/20   SHorald Pollen MD  famotidine (PEPCID) 20 MG tablet Take 1 tablet (20 mg total) by mouth 2 (two) times daily as needed for heartburn or indigestion. 11/05/20   SHorald Pollen MD  losartan-hydrochlorothiazide ([facility] 100-12.5 MG tablet Take 1 tablet by mouth daily. 05/13/21   SHorald Pollen MD    Allergies  Allergen Reactions   Other Rash    Needs Hypoallergenic Sheets/ Laundry Cart had questionable allergic reaction to detergent last time he was here.    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    Apraxia 09/06/2017   Malignant melanoma of neck ([facility] 03/26/2016   History of cranioplasty 03/07/2015   Seizures ([facility] 11/27/2014   Fine motor impairment 11/19/2014   Prostate cancer ([facility] 12/12/2012   Essential hypertension 10/21/2011   Hyperlipidemia 10/21/2011   Adenomatous colon polyp 10/21/2011    Past Medical History:  Diagnosis Date   Basal cell carcinoma of face X 2   Brow ptosis    GERD (gastroesophageal reflux disease)    History of kidney stones    Hyperlipidemia    Hypertension    Prostate cancer ([facility] 1999   Seizures ([facility] 11/2014   brain trauma -hematoma   Small bowel obstruction due to adhesions ([facility] 01/30/2014   Subdural hematoma (HCC)    Vitamin B12 deficiency 12/02/2020    Past Surgical History:  Procedure Laterality Date   APPLICATION OF A-CELL OF HEAD/NECK N/A 08/29/2017   Procedure: APPLICATION OF A-CELL;  Surgeon: DWallace Going DO;  [facility]  Service: Plastics;  Laterality: N/A;   BASAL CELL CARCINOMA EXCISION  X 2   "off my face both times"   [place] 10/2010   "took out 8 inches of colon; wasn't cancer"   CRANIOTOMY Right 11/11/2014   Procedure: CRANIOTOMY HEMATOMA EVACUATION SUBDURAL;  Surgeon: ELeeroy Cha MD;  [facility];  Service: Neurosurgery;  Laterality: Right;   CRANIOTOMY Right 11/13/2014   Procedure: Removal bone flap, insertion into the abdominal wall;  Surgeon:  Leeroy Cha, MD;  Location: [place] [facility];  Service: Neurosurgery;  Laterality: Right;   CRANIOTOMY N/A 03/07/2015   Procedure: Cranioplasty using Bone flap in abdomen;  Surgeon: Leeroy Cha, MD;  Location: [place] [facility];  Service: Neurosurgery;  Laterality: N/A;  Cranioplasty using Bone flap in abdomen   CRANIOTOMY N/A 08/29/2017   Procedure: CRANIECTOMY AND DEBRIDEMENT;  Surgeon: Jovita Gamma, MD;  Location: [place];  Service: Neurosurgery;  Laterality: N/A;  CRANIECTOMY AND DEBRIDEMENT   EXCISION MASS NECK Right 04/14/2016    Procedure: EXCISION BIOPSY OF RIGHT NECK LYMPH NODE;  Surgeon: Jerrell Belfast, MD;  Location: [place];  Service: ENT;  Laterality: Right;   EXCISION MELANOMA WITH SENTINEL LYMPH NODE BIOPSY Right 04/14/2016   Procedure: WIDE EXCISION MELANOMA RIGHT SHOULDER WITH SENTINEL  NODE MAPPING;  Surgeon: Autumn Messing III, MD;  Location: [place];  Service: General;  Laterality: Right;   PROSTATECTOMY  05/20/1998   SCALP LACERATION REPAIR N/A 08/29/2017   Procedure: SCALP FLAP TO COVER DEFECT;  Surgeon: Wallace Going, DO;  Location: [place];  Service: Plastics;  Laterality: N/A;    Social History   Socioeconomic History   Marital status: Divorced    Spouse name: Not on file   Number of children: 2   Years of education: 12   Highest education level: Not on file  Occupational History   Occupation: retired  Tobacco Use   Smoking status: Former   Smokeless tobacco: Never  Scientific laboratory technician Use: Never used  Substance and Sexual Activity   Alcohol use: Yes    Alcohol/week: 0.0 standard drinks    Comment: 01/30/2014 "maybe a beer twice/yr"   Drug use: No   Sexual activity: Not Currently  Other Topics Concern   Not on file  Social History Narrative   Exercise: 2-3 times a wk   Lives alone   Patient drinks 2 cups of caffeine daily.   Patient is right handed.   Social Determinants of Health   Financial Resource Strain: Low Risk    Difficulty of Paying Living Expenses: Not hard at all  Food Insecurity: No Food Insecurity   Worried About Charity fundraiser in the Last Year: Never true   [place] in the Last Year: Never true  Transportation Needs: No Transportation Needs   Lack of Transportation (Medical): No   Lack of Transportation (Non-Medical): No  Physical Activity: Sufficiently Active   Days of Exercise per Week: 5 days   Minutes of Exercise per Session: 30 min  Stress: No Stress Concern Present   Feeling of Stress : Not at all  Social Connections: Moderately Isolated    Frequency of Communication with Friends and Family: Twice a week   Frequency of Social Gatherings with Friends and Family: Twice a week   Attends Religious Services: 1 to 4 times per year   Active Member of [employer] or Organizations: No   Attends Music therapist: Never   Marital Status: Divorced  Human resources officer Violence: Not At Risk   Fear of Current or Ex-Partner: No   Emotionally Abused: No   Physically Abused: No   Sexually Abused: No    Family History  Problem Relation Age of Onset   Cancer Father        Lung     Review of Systems  Constitutional:  Positive for weight loss. Negative for chills and fever.  HENT: Negative.  Negative for congestion and sore throat.  Respiratory: Negative.  Negative for cough and shortness of breath.   Cardiovascular: Negative.  Negative for chest pain and palpitations.  Gastrointestinal: Negative.  Negative for abdominal pain, diarrhea, nausea and vomiting.  Genitourinary: Negative.  Negative for dysuria and hematuria.  Skin: Negative.  Negative for rash.   Today's Vitals   11/10/21 1302  BP: (!) 160/84  Pulse: (!) 104  Temp: 98.1 F (36.7 C)  TempSrc: Oral  SpO2: 95%  Weight: 150 lb (68 kg)  Height: '5\' 10"'$  (1.778 m)   Body mass index is 21.52 kg/m.  Physical Exam Vitals reviewed.  Constitutional:      Appearance: Normal appearance.  HENT:     Head: Normocephalic.     Mouth/Throat:     Mouth: Mucous membranes are moist.     Pharynx: Oropharynx is clear.  Eyes:     Extraocular Movements: Extraocular movements intact.     Conjunctiva/sclera: Conjunctivae normal.     Pupils: Pupils are equal, round, and reactive to light.  Cardiovascular:     Rate and Rhythm: Normal rate and regular rhythm.     Pulses: Normal pulses.     Heart sounds: Normal heart sounds.  Pulmonary:     Effort: Pulmonary effort is normal.     Breath sounds: Normal breath sounds.  Abdominal:     General: Bowel sounds are normal. There is no  distension.     Palpations: Abdomen is soft.     Tenderness: There is no abdominal tenderness.  Musculoskeletal:     Cervical back: No tenderness.     Right lower leg: No edema.     Left lower leg: No edema.  Lymphadenopathy:     Cervical: No cervical adenopathy.  Skin:    General: Skin is warm and dry.     Capillary Refill: Capillary refill takes less than 2 seconds.  Neurological:     General: No focal deficit present.     Mental Status: He is alert and oriented to person, place, and time.  Psychiatric:        Mood and Affect: Mood normal.        Behavior: Behavior normal.     ASSESSMENT & PLAN: A total of 47 minutes was spent with the patient and counseling/coordination of care regarding preparing for this visit, review of most recent office visit notes, review of multiple chronic medical problems under management, review of all medications, review of most recent blood work results, education on nutrition, weight loss differential diagnosis and need for work-up, prognosis, documentation and need for follow-up.  Problem List Items Addressed This Visit       Cardiovascular and Mediastinum   Essential hypertension    Elevated blood pressure reading in the office but normal readings at home. Continue Hyzaar 100-12.5 mg and amlodipine 10 mg daily.       Relevant Medications   amLODipine (NORVASC) 10 MG tablet   atorvastatin (LIPITOR) 40 MG tablet   losartan-hydrochlorothiazide (HYZAAR) 100-12.5 MG tablet     Nervous and Auditory   Mild dementia without behavioral disturbance, psychotic disturbance, mood disturbance, or anxiety (HCC)   Frontotemporal dementia (HCC)    Stable.  Sees neurologist on a regular basis.         Other   Hyperlipidemia   Relevant Medications   amLODipine (NORVASC) 10 MG tablet   atorvastatin (LIPITOR) 40 MG tablet   losartan-hydrochlorothiazide (HYZAAR) 100-12.5 MG tablet   Vitamin B12 deficiency   Relevant Orders   Vitamin B12  Prediabetes   Weight loss - Primary    Differential diagnosis discussed. Needs work-up.       Relevant Orders   Urinalysis   Sedimentation rate   CBC with Differential/Platelet   Comprehensive metabolic panel   Vitamin O84   Hemoglobin A1c   Lipid panel   DG Chest 2 View   Ambulatory referral to Gastroenterology   Dyspepsia    Active.  Pepcid not working.  We will start pantoprazole 40 mg daily. GI referral placed for colonoscopy as well.       Relevant Medications   pantoprazole (PROTONIX) 40 MG tablet   Other Visit Diagnoses     Colon cancer screening       Relevant Orders   Ambulatory referral to Gastroenterology      Patient Instructions  Health Maintenance After Age 13 After age 82, you are at a higher risk for certain long-term diseases and infections as well as injuries from falls. Falls are a major cause of broken bones and head injuries in people who are older than age 77. Getting regular preventive care can help to keep you healthy and well. Preventive care includes getting regular testing and making lifestyle changes as recommended by your health care provider. Talk with your health care provider about: Which screenings and tests you should have. A screening is a test that checks for a disease when you have no symptoms. A diet and exercise plan that is right for you. What should I know about screenings and tests to prevent falls? Screening and testing are the best ways to find a health problem early. Early diagnosis and treatment give you the best chance of managing medical conditions that are common after age 36. Certain conditions and lifestyle choices may make you more likely to have a fall. Your health care provider may recommend: Regular vision checks. Poor vision and conditions such as cataracts can make you more likely to have a fall. If you wear glasses, make sure to get your prescription updated if your vision changes. Medicine review. Work with your  health care provider to regularly review all of the medicines you are taking, including over-the-counter medicines. Ask your health care provider about any side effects that may make you more likely to have a fall. Tell your health care provider if any medicines that you take make you feel dizzy or sleepy. Strength and balance checks. Your health care provider may recommend certain tests to check your strength and balance while standing, walking, or changing positions. Foot health exam. Foot pain and numbness, as well as not wearing proper footwear, can make you more likely to have a fall. Screenings, including: Osteoporosis screening. Osteoporosis is a condition that causes the bones to get weaker and break more easily. Blood pressure screening. Blood pressure changes and medicines to control blood pressure can make you feel dizzy. Depression screening. You may be more likely to have a fall if you have a fear of falling, feel depressed, or feel unable to do activities that you used to do. Alcohol use screening. Using too much alcohol can affect your balance and may make you more likely to have a fall. Follow these instructions at home: Lifestyle Do not drink alcohol if: Your health care provider tells you not to drink. If you drink alcohol: Limit how much you have to: 0-1 drink a day for women. 0-2 drinks a day for men. Know how much alcohol is in your drink. In the [place] one drink equals one 12  oz bottle of beer (355 mL), one 5 oz glass of wine (148 mL), or one 1 oz glass of hard liquor (44 mL). Do not use any products that contain nicotine or tobacco. These products include cigarettes, chewing tobacco, and vaping devices, such as e-cigarettes. If you need help quitting, ask your health care provider. Activity  Follow a regular exercise program to stay fit. This will help you maintain your balance. Ask your health care provider what types of exercise are appropriate for you. If you need a  cane or walker, use it as recommended by your health care provider. Wear supportive shoes that have nonskid soles. Safety  Remove any tripping hazards, such as rugs, cords, and clutter. Install safety equipment such as grab bars in bathrooms and safety rails on stairs. Keep rooms and walkways well-lit. General instructions Talk with your health care provider about your risks for falling. Tell your health care provider if: You fall. Be sure to tell your health care provider about all falls, even ones that seem minor. You feel dizzy, tiredness (fatigue), or off-balance. Take over-the-counter and prescription medicines only as told by your health care provider. These include supplements. Eat a healthy diet and maintain a healthy weight. A healthy diet includes low-fat dairy products, low-fat (lean) meats, and fiber from whole grains, beans, and lots of fruits and vegetables. Stay current with your vaccines. Schedule regular health, dental, and eye exams. Summary Having a healthy lifestyle and getting preventive care can help to protect your health and wellness after age 64. Screening and testing are the best way to find a health problem early and help you avoid having a fall. Early diagnosis and treatment give you the best chance for managing medical conditions that are more common for people who are older than age 54. Falls are a major cause of broken bones and head injuries in people who are older than age 74. Take precautions to prevent a fall at home. Work with your health care provider to learn what changes you can make to improve your health and wellness and to prevent falls. This information is not intended to replace advice given to you by your health care provider. Make sure you discuss any questions you have with your health care provider. Document Revised: 10/20/2020 Document Reviewed: 10/20/2020 Elsevier Patient Education  [place], MD [place]  Primary Care at [facility]

## 2021-11-10 NOTE — Patient Instructions (Signed)
Health Maintenance After Age 77 After age 77, you are at a higher risk for certain long-term diseases and infections as well as injuries from falls. Falls are a major cause of broken bones and head injuries in people who are older than age 77. Getting regular preventive care can help to keep you healthy and well. Preventive care includes getting regular testing and making lifestyle changes as recommended by your health care provider. Talk with your health care provider about: Which screenings and tests you should have. A screening is a test that checks for a disease when you have no symptoms. A diet and exercise plan that is right for you. What should I know about screenings and tests to prevent falls? Screening and testing are the best ways to find a health problem early. Early diagnosis and treatment give you the best chance of managing medical conditions that are common after age 77. Certain conditions and lifestyle choices may make you more likely to have a fall. Your health care provider may recommend: Regular vision checks. Poor vision and conditions such as cataracts can make you more likely to have a fall. If you wear glasses, make sure to get your prescription updated if your vision changes. Medicine review. Work with your health care provider to regularly review all of the medicines you are taking, including over-the-counter medicines. Ask your health care provider about any side effects that may make you more likely to have a fall. Tell your health care provider if any medicines that you take make you feel dizzy or sleepy. Strength and balance checks. Your health care provider may recommend certain tests to check your strength and balance while standing, walking, or changing positions. Foot health exam. Foot pain and numbness, as well as not wearing proper footwear, can make you more likely to have a fall. Screenings, including: Osteoporosis screening. Osteoporosis is a condition that causes  the bones to get weaker and break more easily. Blood pressure screening. Blood pressure changes and medicines to control blood pressure can make you feel dizzy. Depression screening. You may be more likely to have a fall if you have a fear of falling, feel depressed, or feel unable to do activities that you used to do. Alcohol use screening. Using too much alcohol can affect your balance and may make you more likely to have a fall. Follow these instructions at home: Lifestyle Do not drink alcohol if: Your health care provider tells you not to drink. If you drink alcohol: Limit how much you have to: 0-1 drink a day for women. 0-2 drinks a day for men. Know how much alcohol is in your drink. In the [place] one drink equals one 12 oz bottle of beer (355 mL), one 5 oz glass of wine (148 mL), or one 1 oz glass of hard liquor (44 mL). Do not use any products that contain nicotine or tobacco. These products include cigarettes, chewing tobacco, and vaping devices, such as e-cigarettes. If you need help quitting, ask your health care provider. Activity  Follow a regular exercise program to stay fit. This will help you maintain your balance. Ask your health care provider what types of exercise are appropriate for you. If you need a cane or walker, use it as recommended by your health care provider. Wear supportive shoes that have nonskid soles. Safety  Remove any tripping hazards, such as rugs, cords, and clutter. Install safety equipment such as grab bars in bathrooms and safety rails on stairs. Keep rooms and walkways   well-lit. General instructions Talk with your health care provider about your risks for falling. Tell your health care provider if: You fall. Be sure to tell your health care provider about all falls, even ones that seem minor. You feel dizzy, tiredness (fatigue), or off-balance. Take over-the-counter and prescription medicines only as told by your health care provider. These include  supplements. Eat a healthy diet and maintain a healthy weight. A healthy diet includes low-fat dairy products, low-fat (lean) meats, and fiber from whole grains, beans, and lots of fruits and vegetables. Stay current with your vaccines. Schedule regular health, dental, and eye exams. Summary Having a healthy lifestyle and getting preventive care can help to protect your health and wellness after age 77. Screening and testing are the best way to find a health problem early and help you avoid having a fall. Early diagnosis and treatment give you the best chance for managing medical conditions that are more common for people who are older than age 77. Falls are a major cause of broken bones and head injuries in people who are older than age 77. Take precautions to prevent a fall at home. Work with your health care provider to learn what changes you can make to improve your health and wellness and to prevent falls. This information is not intended to replace advice given to you by your health care provider. Make sure you discuss any questions you have with your health care provider. Document Revised: 10/20/2020 Document Reviewed: 10/20/2020 Elsevier Patient Education  2023 Elsevier Inc.  

## 2021-11-23 DIAGNOSIS — L570 Actinic keratosis: Secondary | ICD-10-CM | POA: Diagnosis not present

## 2021-11-23 DIAGNOSIS — Z86007 Personal history of in-situ neoplasm of skin: Secondary | ICD-10-CM | POA: Diagnosis not present

## 2021-11-23 DIAGNOSIS — L814 Other melanin hyperpigmentation: Secondary | ICD-10-CM | POA: Diagnosis not present

## 2021-11-23 DIAGNOSIS — L821 Other seborrheic keratosis: Secondary | ICD-10-CM | POA: Diagnosis not present

## 2021-11-23 DIAGNOSIS — C44619 Basal cell carcinoma of skin of left upper limb, including shoulder: Secondary | ICD-10-CM | POA: Diagnosis not present

## 2021-11-23 DIAGNOSIS — D225 Melanocytic nevi of trunk: Secondary | ICD-10-CM | POA: Diagnosis not present

## 2021-11-23 DIAGNOSIS — Z08 Encounter for follow-up examination after completed treatment for malignant neoplasm: Secondary | ICD-10-CM | POA: Diagnosis not present

## 2021-11-23 DIAGNOSIS — Z85820 Personal history of malignant melanoma of skin: Secondary | ICD-10-CM | POA: Diagnosis not present

## 2021-11-23 DIAGNOSIS — Z85828 Personal history of other malignant neoplasm of skin: Secondary | ICD-10-CM | POA: Diagnosis not present

## 2021-12-03 ENCOUNTER — Ambulatory Visit: Payer: Medicare Other | Admitting: Adult Health

## 2021-12-07 ENCOUNTER — Ambulatory Visit: Payer: Medicare Other | Admitting: Adult Health

## 2021-12-07 ENCOUNTER — Encounter: Payer: Self-pay | Admitting: Adult Health

## 2021-12-07 VITALS — BP 156/85 | HR 83 | Ht 70.0 in | Wt 151.2 lb

## 2021-12-07 DIAGNOSIS — F0280 Dementia in other diseases classified elsewhere without behavioral disturbance: Secondary | ICD-10-CM

## 2021-12-07 DIAGNOSIS — G3109 Other frontotemporal dementia: Secondary | ICD-10-CM | POA: Diagnosis not present

## 2021-12-07 DIAGNOSIS — E538 Deficiency of other specified B group vitamins: Secondary | ICD-10-CM

## 2022-01-11 ENCOUNTER — Other Ambulatory Visit: Payer: Self-pay | Admitting: Emergency Medicine

## 2022-01-11 DIAGNOSIS — R1013 Epigastric pain: Secondary | ICD-10-CM

## 2022-03-25 DIAGNOSIS — Z85820 Personal history of malignant melanoma of skin: Secondary | ICD-10-CM | POA: Diagnosis not present

## 2022-03-25 DIAGNOSIS — L821 Other seborrheic keratosis: Secondary | ICD-10-CM | POA: Diagnosis not present

## 2022-03-25 DIAGNOSIS — L708 Other acne: Secondary | ICD-10-CM | POA: Diagnosis not present

## 2022-03-25 DIAGNOSIS — Z86007 Personal history of in-situ neoplasm of skin: Secondary | ICD-10-CM | POA: Diagnosis not present

## 2022-03-25 DIAGNOSIS — Z08 Encounter for follow-up examination after completed treatment for malignant neoplasm: Secondary | ICD-10-CM | POA: Diagnosis not present

## 2022-03-25 DIAGNOSIS — L814 Other melanin hyperpigmentation: Secondary | ICD-10-CM | POA: Diagnosis not present

## 2022-03-25 DIAGNOSIS — Z85828 Personal history of other malignant neoplasm of skin: Secondary | ICD-10-CM | POA: Diagnosis not present

## 2022-03-25 DIAGNOSIS — D225 Melanocytic nevi of trunk: Secondary | ICD-10-CM | POA: Diagnosis not present

## 2022-03-25 DIAGNOSIS — L570 Actinic keratosis: Secondary | ICD-10-CM | POA: Diagnosis not present

## 2022-05-13 ENCOUNTER — Encounter: Payer: Self-pay | Admitting: Emergency Medicine

## 2022-05-13 ENCOUNTER — Ambulatory Visit (INDEPENDENT_AMBULATORY_CARE_PROVIDER_SITE_OTHER): Payer: Medicare Other | Admitting: Emergency Medicine

## 2022-05-13 VITALS — BP 134/72 | HR 115 | Temp 98.5°F | Ht 70.0 in | Wt 153.2 lb

## 2022-05-13 DIAGNOSIS — R7303 Prediabetes: Secondary | ICD-10-CM

## 2022-05-13 DIAGNOSIS — E538 Deficiency of other specified B group vitamins: Secondary | ICD-10-CM | POA: Diagnosis not present

## 2022-05-13 DIAGNOSIS — E785 Hyperlipidemia, unspecified: Secondary | ICD-10-CM | POA: Diagnosis not present

## 2022-05-13 DIAGNOSIS — F03A0 Unspecified dementia, mild, without behavioral disturbance, psychotic disturbance, mood disturbance, and anxiety: Secondary | ICD-10-CM | POA: Diagnosis not present

## 2022-05-13 DIAGNOSIS — I10 Essential (primary) hypertension: Secondary | ICD-10-CM

## 2022-05-13 NOTE — Assessment & Plan Note (Signed)
Stable.  Diet and nutrition discussed. Continue atorvastatin 40 mg daily. 

## 2022-05-13 NOTE — Assessment & Plan Note (Signed)
Not presently supplementing. Advised to take daily supplementation

## 2022-05-13 NOTE — Assessment & Plan Note (Signed)
Well-controlled hypertension. Continue Hyzaar 100-12.5 mg daily and amlodipine 10 mg daily. BP Readings from Last 3 Encounters:  05/13/22 134/72  12/07/21 (!) 156/85  11/10/21 130/70

## 2022-05-13 NOTE — Assessment & Plan Note (Signed)
Stable.  No concerns. °

## 2022-05-13 NOTE — Progress Notes (Signed)
Martin Edwards 76 y.o.   Chief Complaint  Patient presents with   Follow-up    52mth f/u appt , no concerns     HISTORY OF PRESENT ILLNESS: This is a 76y.o. male here for 687-monthollow-up of chronic medical conditions including hypertension. Accompanied by son. Doing well.  Has no complaints or medical concerns today. BP Readings from Last 3 Encounters:  05/13/22 134/72  12/07/21 (!) 156/85  11/10/21 130/70   Wt Readings from Last 3 Encounters:  05/13/22 153 lb 4 oz (69.5 kg)  12/07/21 151 lb 3.2 oz (68.6 kg)  11/10/21 150 lb (68 kg)     HPI   Prior to Admission medications   Medication Sig Start Date End Date Taking? Authorizing Provider  amLODipine (NORVASC) 10 MG tablet Take 1 tablet (10 mg total) by mouth daily. 11/10/21  Yes Lainy Wrobleski, MiInes BloomerMD  atorvastatin (LIPITOR) 40 MG tablet Take 1 tablet (40 mg total) by mouth daily. 11/10/21  Yes SaHorald PollenMD  famotidine (PEPCID) 20 MG tablet TAKE 1 TABLET BY MOUTH TWICE  DAILY AS NEEDED FOR HEARTBURN OR INDIGESTION 01/11/22  Yes Nao Linz, MiInes BloomerMD  losartan-hydrochlorothiazide (HYZAAR) 100-12.5 MG tablet Take 1 tablet by mouth daily. 11/10/21  Yes Quantarius Genrich, MiInes BloomerMD  pantoprazole (PROTONIX) 40 MG tablet TAKE 1 TABLET BY MOUTH DAILY 01/11/22  Yes SaHorald PollenMD    Allergies  Allergen Reactions   Other Rash    Needs Hypoallergenic Sheets/ Laundry Cart had questionable allergic reaction to detergent last time he was here.    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   Apraxia 09/06/2017    Malignant melanoma of neck (HC[facility] [place]10/13/2017   History of cranioplasty 03/07/2015   Seizures ([facility]CG[place]06/15/2016   Fine motor impairment 11/19/2014   Prostate cancer ([facility]CL[facility]07/06/2012   Essential hypertension 10/21/2011   Hyperlipidemia 10/21/2011   Adenomatous colon polyp 10/21/2011    Past Medical History:  Diagnosis Date   Basal cell carcinoma of face X 2   Brow ptosis    GERD (gastroesophageal reflux disease)    History of kidney stones    Hyperlipidemia    Hypertension    Prostate cancer (HCRice1999   Seizures (HC[facility]ins [facility]06/2016   brain trauma -hematoma   Small bowel obstruction due to adhesions (HCBlo[facility]8/19/2015   Subdural hematoma (HCC)    Vitamin B12 deficiency 12/02/2020    Past Surgical History:  Procedure Laterality Date   APPLICATION OF A-CELL OF HEAD/NECK N/A 08/29/2017   Procedure: APPLICATION OF A-CELL;  Surgeon: DiWallace GoingDO;  [facility]: [facility] Service: Plastics;  Laterality: N/A;   BASAL CELL CARCINOMA EXCISION  X 2   "off my face both times"   BR[facility]5/2012   "took out 8 inches of colon; wasn't cancer"   CRANIOTOMY Right 11/11/2014   Procedure: CRANIOTOMY HEMATOMA EVACUATION SUBDURAL;  Surgeon: ErLeeroy ChaMD;  [facility];  Service: Neurosurgery;  Laterality: Right;   CRANIOTOMY Right 11/13/2014   Procedure: Removal bone flap, insertion into the abdominal wall;  Surgeon: ErLeeroy Cha  MD;  Location: [place] [facility];  Service: Neurosurgery;  Laterality: Right;   CRANIOTOMY N/A 03/07/2015   Procedure: Cranioplasty using Bone flap in abdomen;  Surgeon: Leeroy Cha, MD;  Location: [place] [facility];  Service: Neurosurgery;  Laterality: N/A;  Cranioplasty using Bone flap in abdomen   CRANIOTOMY N/A 08/29/2017   Procedure: CRANIECTOMY AND DEBRIDEMENT;  Surgeon: Jovita Gamma, MD;  Location: [place];  Service: Neurosurgery;  Laterality: N/A;  CRANIECTOMY AND DEBRIDEMENT   EXCISION MASS NECK Right 04/14/2016   Procedure: EXCISION BIOPSY  OF RIGHT NECK LYMPH NODE;  Surgeon: Jerrell Belfast, MD;  Location: [place];  Service: ENT;  Laterality: Right;   EXCISION MELANOMA WITH SENTINEL LYMPH NODE BIOPSY Right 04/14/2016   Procedure: WIDE EXCISION MELANOMA RIGHT SHOULDER WITH SENTINEL  NODE MAPPING;  Surgeon: Autumn Messing III, MD;  Location: [place];  Service: General;  Laterality: Right;   PROSTATECTOMY  05/20/1998   SCALP LACERATION REPAIR N/A 08/29/2017   Procedure: SCALP FLAP TO COVER DEFECT;  Surgeon: Wallace Going, DO;  Location: [place];  Service: Plastics;  Laterality: N/A;    Social History   Socioeconomic History   Marital status: Divorced    Spouse name: Not on file   Number of children: 2   Years of education: 12   Highest education level: Not on file  Occupational History   Occupation: retired  Tobacco Use   Smoking status: Former   Smokeless tobacco: Never  Scientific laboratory technician Use: Never used  Substance and Sexual Activity   Alcohol use: Yes    Alcohol/week: 0.0 standard drinks of alcohol    Comment: 01/30/2014 "maybe a beer twice/yr"   Drug use: No   Sexual activity: Not Currently  Other Topics Concern   Not on file  Social History Narrative   Exercise: 2-3 times a wk   Lives alone   Patient drinks 2 cups of caffeine daily.   Patient is right handed.   Social Determinants of Health   Financial Resource Strain: Low Risk  (09/25/2021)   Overall Financial Resource Strain (CARDIA)    Difficulty of Paying Living Expenses: Not hard at all  Food Insecurity: No Food Insecurity (09/25/2021)   Hunger Vital Sign    Worried About Running Out of Food in the Last Year: Never true    Ran Out of Food in the Last Year: Never true  Transportation Needs: No Transportation Needs (09/25/2021)   PRAPARE - Hydrologist (Medical): No    Lack of Transportation (Non-Medical): No  Physical Activity: Sufficiently Active (09/25/2021)   Exercise Vital Sign    Days of Exercise per Week: 5 days     Minutes of Exercise per Session: 30 min  Stress: No Stress Concern Present (09/25/2021)   [place]    Feeling of Stress : Not at all  Social Connections: Moderately Isolated (09/25/2021)   Social Connection and Isolation Panel [NHANES]    Frequency of Communication with Friends and Family: Twice a week    Frequency of Social Gatherings with Friends and Family: Twice a week    Attends Religious Services: 1 to 4 times per year    Active Member of [employer] or Organizations: No    Attends Archivist Meetings: Never    Marital Status: Divorced  Human resources officer Violence: Not At Risk (09/25/2021)   Humiliation, Afraid, Rape, and Kick questionnaire    Fear of Current or  Ex-Partner: No    Emotionally Abused: No    Physically Abused: No    Sexually Abused: No    Family History  Problem Relation Age of Onset   Cancer Father        Lung   Alzheimer's disease Sister    Dementia Neg Hx      Review of Systems  Constitutional: Negative.  Negative for chills and fever.  HENT: Negative.  Negative for congestion and sore throat.   Respiratory: Negative.  Negative for cough and shortness of breath.   Cardiovascular: Negative.  Negative for chest pain and palpitations.  Gastrointestinal:  Negative for abdominal pain, nausea and vomiting.  Genitourinary: Negative.   Skin: Negative.  Negative for rash.  Neurological: Negative.  Negative for dizziness and headaches.  All other systems reviewed and are negative.  Today's Vitals   05/13/22 0927  BP: 134/72  Pulse: (!) 115  Temp: 98.5 F (36.9 C)  TempSrc: Oral  SpO2: 99%  Weight: 153 lb 4 oz (69.5 kg)  Height: '5\' 10"'$  (1.778 m)   Body mass index is 21.99 kg/m.   Physical Exam Vitals reviewed.  Constitutional:      Appearance: Normal appearance.  HENT:     Head: Normocephalic.     Mouth/Throat:     Mouth: Mucous membranes are moist.     Pharynx: Oropharynx is  clear.  Eyes:     Extraocular Movements: Extraocular movements intact.     Conjunctiva/sclera: Conjunctivae normal.     Pupils: Pupils are equal, round, and reactive to light.  Cardiovascular:     Rate and Rhythm: Normal rate and regular rhythm.     Pulses: Normal pulses.     Heart sounds: Normal heart sounds.  Pulmonary:     Effort: Pulmonary effort is normal.     Breath sounds: Normal breath sounds.  Musculoskeletal:        General: Normal range of motion.     Cervical back: No tenderness.  Lymphadenopathy:     Cervical: No cervical adenopathy.  Skin:    General: Skin is warm and dry.  Neurological:     General: No focal deficit present.     Mental Status: He is alert and oriented to person, place, and time.  Psychiatric:        Mood and Affect: Mood normal.        Behavior: Behavior normal.      ASSESSMENT & PLAN: A total of 47 minutes was spent with the patient and counseling/coordination of care regarding preparing for this visit, review of most recent office visit notes, review of most recent blood work results, review of multiple chronic medical conditions and their management, review of all medications, education on nutrition, prognosis, documentation, and need for follow-up.  Problem List Items Addressed This Visit       Cardiovascular and Mediastinum   Essential hypertension - Primary    Well-controlled hypertension. Continue Hyzaar 100-12.5 mg daily and amlodipine 10 mg daily. BP Readings from Last 3 Encounters:  05/13/22 134/72  12/07/21 (!) 156/85  11/10/21 130/70          Nervous and Auditory   Mild dementia without behavioral disturbance, psychotic disturbance, mood disturbance, or anxiety (HCC)    Stable.  No concerns.        Other   Dyslipidemia    Stable.  Diet and nutrition discussed. Continue atorvastatin 40 mg daily.      Vitamin B12 deficiency    Not presently supplementing.  Advised to take daily supplementation      Prediabetes     Stable.  Diet and nutrition discussed.      Patient Instructions  Hypertension, Adult High blood pressure (hypertension) is when the force of blood pumping through the arteries is too strong. The arteries are the blood vessels that carry blood from the heart throughout the body. Hypertension forces the heart to work harder to pump blood and may cause arteries to become narrow or stiff. Untreated or uncontrolled hypertension can lead to a heart attack, heart failure, a stroke, kidney disease, and other problems. A blood pressure reading consists of a higher number over a lower number. Ideally, your blood pressure should be below 120/80. The first ("top") number is called the systolic pressure. It is a measure of the pressure in your arteries as your heart beats. The second ("bottom") number is called the diastolic pressure. It is a measure of the pressure in your arteries as the heart relaxes. What are the causes? The exact cause of this condition is not known. There are some conditions that result in high blood pressure. What increases the risk? Certain factors may make you more likely to develop high blood pressure. Some of these risk factors are under your control, including: Smoking. Not getting enough exercise or physical activity. Being overweight. Having too much fat, sugar, calories, or salt (sodium) in your diet. Drinking too much alcohol. Other risk factors include: Having a personal history of heart disease, diabetes, high cholesterol, or kidney disease. Stress. Having a family history of high blood pressure and high cholesterol. Having obstructive sleep apnea. Age. The risk increases with age. What are the signs or symptoms? High blood pressure may not cause symptoms. Very high blood pressure (hypertensive crisis) may cause: Headache. Fast or irregular heartbeats (palpitations). Shortness of breath. Nosebleed. Nausea and vomiting. Vision changes. Severe chest pain,  dizziness, and seizures. How is this diagnosed? This condition is diagnosed by measuring your blood pressure while you are seated, with your arm resting on a flat surface, your legs uncrossed, and your feet flat on the floor. The cuff of the blood pressure monitor will be placed directly against the skin of your upper arm at the level of your heart. Blood pressure should be measured at least twice using the same arm. Certain conditions can cause a difference in blood pressure between your right and left arms. If you have a high blood pressure reading during one visit or you have normal blood pressure with other risk factors, you may be asked to: Return on a different day to have your blood pressure checked again. Monitor your blood pressure at home for 1 week or longer. If you are diagnosed with hypertension, you may have other blood or imaging tests to help your health care provider understand your overall risk for other conditions. How is this treated? This condition is treated by making healthy lifestyle changes, such as eating healthy foods, exercising more, and reducing your alcohol intake. You may be referred for counseling on a healthy diet and physical activity. Your health care provider may prescribe medicine if lifestyle changes are not enough to get your blood pressure under control and if: Your systolic blood pressure is above 130. Your diastolic blood pressure is above 80. Your personal target blood pressure may vary depending on your medical conditions, your age, and other factors. Follow these instructions at home: Eating and drinking  Eat a diet that is high in fiber and potassium, and low in  sodium, added sugar, and fat. An example of this eating plan is called the DASH diet. DASH stands for Dietary Approaches to Stop Hypertension. To eat this way: Eat plenty of fresh fruits and vegetables. Try to fill one half of your plate at each meal with fruits and vegetables. Eat whole  grains, such as whole-wheat pasta, brown rice, or whole-grain bread. Fill about one fourth of your plate with whole grains. Eat or drink low-fat dairy products, such as skim milk or low-fat yogurt. Avoid fatty cuts of meat, processed or cured meats, and poultry with skin. Fill about one fourth of your plate with lean proteins, such as fish, chicken without skin, beans, eggs, or tofu. Avoid pre-made and processed foods. These tend to be higher in sodium, added sugar, and fat. Reduce your daily sodium intake. Many people with hypertension should eat less than 1,500 mg of sodium a day. Do not drink alcohol if: Your health care provider tells you not to drink. You are pregnant, may be pregnant, or are planning to become pregnant. If you drink alcohol: Limit how much you have to: 0-1 drink a day for women. 0-2 drinks a day for men. Know how much alcohol is in your drink. In the [place] one drink equals one 12 oz bottle of beer (355 mL), one 5 oz glass of wine (148 mL), or one 1 oz glass of hard liquor (44 mL). Lifestyle  Work with your health care provider to maintain a healthy body weight or to lose weight. Ask what an ideal weight is for you. Get at least 30 minutes of exercise that causes your heart to beat faster (aerobic exercise) most days of the week. Activities may include walking, swimming, or biking. Include exercise to strengthen your muscles (resistance exercise), such as Pilates or lifting weights, as part of your weekly exercise routine. Try to do these types of exercises for 30 minutes at least 3 days a week. Do not use any products that contain nicotine or tobacco. These products include cigarettes, chewing tobacco, and vaping devices, such as e-cigarettes. If you need help quitting, ask your health care provider. Monitor your blood pressure at home as told by your health care provider. Keep all follow-up visits. This is important. Medicines Take over-the-counter and prescription  medicines only as told by your health care provider. Follow directions carefully. Blood pressure medicines must be taken as prescribed. Do not skip doses of blood pressure medicine. Doing this puts you at risk for problems and can make the medicine less effective. Ask your health care provider about side effects or reactions to medicines that you should watch for. Contact a health care provider if you: Think you are having a reaction to a medicine you are taking. Have headaches that keep coming back (recurring). Feel dizzy. Have swelling in your ankles. Have trouble with your vision. Get help right away if you: Develop a severe headache or confusion. Have unusual weakness or numbness. Feel faint. Have severe pain in your chest or abdomen. Vomit repeatedly. Have trouble breathing. These symptoms may be an emergency. Get help right away. Call 911. Do not wait to see if the symptoms will go away. Do not drive yourself to the hospital. Summary Hypertension is when the force of blood pumping through your arteries is too strong. If this condition is not controlled, it may put you at risk for serious complications. Your personal target blood pressure may vary depending on your medical conditions, your age, and other factors. For most  people, a normal blood pressure is less than 120/80. Hypertension is treated with lifestyle changes, medicines, or a combination of both. Lifestyle changes include losing weight, eating a healthy, low-sodium diet, exercising more, and limiting alcohol. This information is not intended to replace advice given to you by your health care provider. Make sure you discuss any questions you have with your health care provider. Document Revised: 04/07/2021 Document Reviewed: 04/07/2021 Elsevier Patient Education  [place], MD [facility]

## 2022-05-13 NOTE — Patient Instructions (Signed)
Hypertension, Adult High blood pressure (hypertension) is when the force of blood pumping through the arteries is too strong. The arteries are the blood vessels that carry blood from the heart throughout the body. Hypertension forces the heart to work harder to pump blood and may cause arteries to become narrow or stiff. Untreated or uncontrolled hypertension can lead to a heart attack, heart failure, a stroke, kidney disease, and other problems. A blood pressure reading consists of a higher number over a lower number. Ideally, your blood pressure should be below 120/80. The first ("top") number is called the systolic pressure. It is a measure of the pressure in your arteries as your heart beats. The second ("bottom") number is called the diastolic pressure. It is a measure of the pressure in your arteries as the heart relaxes. What are the causes? The exact cause of this condition is not known. There are some conditions that result in high blood pressure. What increases the risk? Certain factors may make you more likely to develop high blood pressure. Some of these risk factors are under your control, including: Smoking. Not getting enough exercise or physical activity. Being overweight. Having too much fat, sugar, calories, or salt (sodium) in your diet. Drinking too much alcohol. Other risk factors include: Having a personal history of heart disease, diabetes, high cholesterol, or kidney disease. Stress. Having a family history of high blood pressure and high cholesterol. Having obstructive sleep apnea. Age. The risk increases with age. What are the signs or symptoms? High blood pressure may not cause symptoms. Very high blood pressure (hypertensive crisis) may cause: Headache. Fast or irregular heartbeats (palpitations). Shortness of breath. Nosebleed. Nausea and vomiting. Vision changes. Severe chest pain, dizziness, and seizures. How is this diagnosed? This condition is diagnosed by  measuring your blood pressure while you are seated, with your arm resting on a flat surface, your legs uncrossed, and your feet flat on the floor. The cuff of the blood pressure monitor will be placed directly against the skin of your upper arm at the level of your heart. Blood pressure should be measured at least twice using the same arm. Certain conditions can cause a difference in blood pressure between your right and left arms. If you have a high blood pressure reading during one visit or you have normal blood pressure with other risk factors, you may be asked to: Return on a different day to have your blood pressure checked again. Monitor your blood pressure at home for 1 week or longer. If you are diagnosed with hypertension, you may have other blood or imaging tests to help your health care provider understand your overall risk for other conditions. How is this treated? This condition is treated by making healthy lifestyle changes, such as eating healthy foods, exercising more, and reducing your alcohol intake. You may be referred for counseling on a healthy diet and physical activity. Your health care provider may prescribe medicine if lifestyle changes are not enough to get your blood pressure under control and if: Your systolic blood pressure is above 130. Your diastolic blood pressure is above 80. Your personal target blood pressure may vary depending on your medical conditions, your age, and other factors. Follow these instructions at home: Eating and drinking  Eat a diet that is high in fiber and potassium, and low in sodium, added sugar, and fat. An example of this eating plan is called the DASH diet. DASH stands for Dietary Approaches to Stop Hypertension. To eat this way: Eat   plenty of fresh fruits and vegetables. Try to fill one half of your plate at each meal with fruits and vegetables. Eat whole grains, such as whole-wheat pasta, brown rice, or whole-grain bread. Fill about one  fourth of your plate with whole grains. Eat or drink low-fat dairy products, such as skim milk or low-fat yogurt. Avoid fatty cuts of meat, processed or cured meats, and poultry with skin. Fill about one fourth of your plate with lean proteins, such as fish, chicken without skin, beans, eggs, or tofu. Avoid pre-made and processed foods. These tend to be higher in sodium, added sugar, and fat. Reduce your daily sodium intake. Many people with hypertension should eat less than 1,500 mg of sodium a day. Do not drink alcohol if: Your health care provider tells you not to drink. You are pregnant, may be pregnant, or are planning to become pregnant. If you drink alcohol: Limit how much you have to: 0-1 drink a day for women. 0-2 drinks a day for men. Know how much alcohol is in your drink. In the [place] one drink equals one 12 oz bottle of beer (355 mL), one 5 oz glass of wine (148 mL), or one 1 oz glass of hard liquor (44 mL). Lifestyle  Work with your health care provider to maintain a healthy body weight or to lose weight. Ask what an ideal weight is for you. Get at least 30 minutes of exercise that causes your heart to beat faster (aerobic exercise) most days of the week. Activities may include walking, swimming, or biking. Include exercise to strengthen your muscles (resistance exercise), such as Pilates or lifting weights, as part of your weekly exercise routine. Try to do these types of exercises for 30 minutes at least 3 days a week. Do not use any products that contain nicotine or tobacco. These products include cigarettes, chewing tobacco, and vaping devices, such as e-cigarettes. If you need help quitting, ask your health care provider. Monitor your blood pressure at home as told by your health care provider. Keep all follow-up visits. This is important. Medicines Take over-the-counter and prescription medicines only as told by your health care provider. Follow directions carefully. Blood  pressure medicines must be taken as prescribed. Do not skip doses of blood pressure medicine. Doing this puts you at risk for problems and can make the medicine less effective. Ask your health care provider about side effects or reactions to medicines that you should watch for. Contact a health care provider if you: Think you are having a reaction to a medicine you are taking. Have headaches that keep coming back (recurring). Feel dizzy. Have swelling in your ankles. Have trouble with your vision. Get help right away if you: Develop a severe headache or confusion. Have unusual weakness or numbness. Feel faint. Have severe pain in your chest or abdomen. Vomit repeatedly. Have trouble breathing. These symptoms may be an emergency. Get help right away. Call 911. Do not wait to see if the symptoms will go away. Do not drive yourself to the hospital. Summary Hypertension is when the force of blood pumping through your arteries is too strong. If this condition is not controlled, it may put you at risk for serious complications. Your personal target blood pressure may vary depending on your medical conditions, your age, and other factors. For most people, a normal blood pressure is less than 120/80. Hypertension is treated with lifestyle changes, medicines, or a combination of both. Lifestyle changes include losing weight, eating a healthy,   low-sodium diet, exercising more, and limiting alcohol. This information is not intended to replace advice given to you by your health care provider. Make sure you discuss any questions you have with your health care provider. Document Revised: 04/07/2021 Document Reviewed: 04/07/2021 Elsevier Patient Education  2023 Elsevier Inc.  

## 2022-05-13 NOTE — Assessment & Plan Note (Signed)
Stable.  Diet and nutrition discussed. 

## 2022-06-30 ENCOUNTER — Ambulatory Visit: Payer: Medicare Other | Admitting: Adult Health

## 2022-06-30 ENCOUNTER — Encounter: Payer: Self-pay | Admitting: Adult Health

## 2022-06-30 VITALS — BP 146/77 | HR 103 | Ht 70.0 in | Wt 152.0 lb

## 2022-06-30 DIAGNOSIS — G3109 Other frontotemporal dementia: Secondary | ICD-10-CM | POA: Diagnosis not present

## 2022-06-30 DIAGNOSIS — F0280 Dementia in other diseases classified elsewhere without behavioral disturbance: Secondary | ICD-10-CM

## 2022-06-30 NOTE — Progress Notes (Signed)
PATIENT: Martin Edwards DOB: [birth year]  REASON FOR VISIT: follow up HISTORY FROM: patient  Chief Complaint  Patient presents with   Follow-up    Pt in 5 with son   Pt here for Memory f/u Pt states no changes in memory since last appointment      HISTORY OF PRESENT ILLNESS: Today 06/30/22:  Martin Edwards is a 77 y.o. male with a history of Frontotemporal Dementia and seizure like activity. Returns today for follow-up. Here with his son. Son reports memory is stable. Able to complete all ADLS independently. He manages he own mediations, appts and finances. Lives alone. No changes in mood or behavior. Sleeps well. Denies any seizure like activity.   12/07/21: Martin Edwards is a 77 year old male with a history of frontotemporal dementia and seizure-like activity.  He returns today for follow-up.  No behavioral issues per son. No significant changes with his memory. Lives at home alone. Able to complete all ADLS independently. Manages all medications, appts and finances independently. Son is available to help when needed. Denies any seizure like activity.  Son reports that he has had a low B12 level.  Was unable to tolerate oral B12.  Reports that PCP mention sublingual B12 but he has not started this yet.  Reports there is been no mention of B12 injections.  Advised that he should continue following up with his PCP for B12 levels  06/04/21: Martin Edwards is a 77 year old male with a history of traumatic brain injury, memory disturbance and seizure events.  He returns today for follow-up.  The patient has not had any seizure-like episodes.  Not currently on any medication.  Dr. 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  He is not on any medication at this time.  Patient notes that his behavior has remained stable.  His son states that there was been only 1 occasion that he switch his insurance abruptly.  He lives at home alone.  He is able to complete all ADLs  independently.  He does operate a motor vehicle without difficulty.  Does report that he only drives to familiar places.  Returns today for an evaluation.  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  The patient was seen previously through this office for the seizures and he was eventually able to come off of his seizure medications and is doing well.  In 2019, he had an infection of the bone flap and had to have this removed, the patient claims that since then he has had some increased problems with memory.  He has had troubles with memory since the original head trauma that occurred on 11 Nov 2014.  He particularly noted problems with word finding, but this issue has gradually worsened over time.  He has had short-term memory issues, but he has not altered any activities of daily living.  He still operates a motor vehicle without difficulty, he is keeping up with his own medications and appointments.  He does his finances.  He currently lives alone.  He claims that he sleeps fairly well and has a good energy level.  He denies any numbness or weakness of extremities, he denies headaches or dizziness.  His primary care physician checked blood work recently that showed evidence of a low vitamin B12 level, he has not yet gone on any supplementation.  He  denies any depression problems.  He comes to this office for an evaluation.  REVIEW OF SYSTEMS: Out of a complete 14 system review of symptoms, the patient complains only of the following symptoms, and all other reviewed systems are negative.  ALLERGIES: Allergies  Allergen Reactions   Other Rash    Needs Hypoallergenic Sheets/ Laundry Cart had questionable allergic reaction to detergent last time he was here.    HOME MEDICATIONS: Outpatient Medications Prior to Visit  Medication Sig Dispense Refill   amLODipine (NORVASC) 10 MG  tablet Take 1 tablet (10 mg total) by mouth daily. 90 tablet 3   atorvastatin (LIPITOR) 40 MG tablet Take 1 tablet (40 mg total) by mouth daily. 90 tablet 3   Cyanocobalamin (B-12 PO) Take by mouth.     famotidine (PEPCID) 20 MG tablet TAKE 1 TABLET BY MOUTH TWICE  DAILY AS NEEDED FOR HEARTBURN OR INDIGESTION 180 tablet 3   losartan-hydrochlorothiazide (HYZAAR) 100-12.5 MG tablet Take 1 tablet by mouth daily. 90 tablet 3   pantoprazole (PROTONIX) 40 MG tablet TAKE 1 TABLET BY MOUTH DAILY 90 tablet 3   No facility-administered medications prior to visit.    PAST MEDICAL HISTORY: Past Medical History:  Diagnosis Date   Basal cell carcinoma of face X 2   Brow ptosis    GERD (gastroesophageal reflux disease)    History of kidney stones    Hyperlipidemia    Hypertension    Prostate cancer ([place]) 1999   Seizures ([place]) 11/2014   brain trauma -hematoma   Small bowel obstruction due to adhesions ([place]) 01/30/2014   Subdural hematoma ([facility])    Vitamin B12 deficiency 12/02/2020    PAST SURGICAL HISTORY: Past Surgical History:  Procedure Laterality Date   APPLICATION OF A-CELL OF HEAD/NECK N/A 08/29/2017   Procedure: APPLICATION OF A-CELL;  Surgeon: Wallace Going, DO;  Location: [facility];  Service: Plastics;  Laterality: N/A;   BASAL CELL CARCINOMA EXCISION  X 2   "off my face both times"   [place]  10/2010   "took out 8 inches of colon; wasn't cancer"   CRANIOTOMY Right 11/11/2014   Procedure: CRANIOTOMY HEMATOMA EVACUATION SUBDURAL;  Surgeon: Leeroy Cha, MD;  Location: [facility];  Service: Neurosurgery;  Laterality: Right;   CRANIOTOMY Right 11/13/2014   Procedure: Removal bone flap, insertion into the abdominal wall;  Surgeon: Leeroy Cha, MD;  Location: [facility];  Service: Neurosurgery;  Laterality: Right;   CRANIOTOMY N/A 03/07/2015   Procedure: Cranioplasty using Bone flap in abdomen;  Surgeon: Leeroy Cha, MD;  Location: [facility];  Service:  Neurosurgery;  Laterality: N/A;  Cranioplasty using Bone flap in abdomen   CRANIOTOMY N/A 08/29/2017   Procedure: CRANIECTOMY AND DEBRIDEMENT;  Surgeon: Jovita Gamma, MD;  Location: [facility];  Service: Neurosurgery;  Laterality: N/A;  CRANIECTOMY AND DEBRIDEMENT   EXCISION MASS NECK Right 04/14/2016   Procedure: EXCISION BIOPSY OF RIGHT NECK LYMPH NODE;  Surgeon: Jerrell Belfast, MD;  Location: [facility];  Service: ENT;  Laterality: Right;   EXCISION MELANOMA WITH SENTINEL LYMPH NODE BIOPSY Right 04/14/2016   Procedure: WIDE EXCISION MELANOMA RIGHT SHOULDER WITH SENTINEL  NODE MAPPING;  Surgeon: Autumn Messing III, MD;  Location: [facility];  Service: General;  Laterality: Right;   PROSTATECTOMY  05/20/1998   SCALP LACERATION REPAIR N/A 08/29/2017   Procedure: SCALP FLAP TO COVER DEFECT;  Surgeon: Wallace Going, DO;  Location: [facility];  Service: Plastics;  Laterality:  N/A;    FAMILY HISTORY: Family History  Problem Relation Age of Onset   Cancer Father        Lung   Alzheimer's disease Sister    Dementia Neg Hx     SOCIAL HISTORY: Social History   Socioeconomic History   Marital status: Divorced    Spouse name: Not on file   Number of children: 2   Years of education: 12   Highest education level: Not on file  Occupational History   Occupation: retired  Tobacco Use   Smoking status: Former   Smokeless tobacco: Never  Scientific laboratory technician Use: Never used  Substance and Sexual Activity   Alcohol use: Yes    Alcohol/week: 0.0 standard drinks of alcohol    Comment: 01/30/2014 "maybe a beer twice/yr"   Drug use: No   Sexual activity: Not Currently  Other Topics Concern   Not on file  Social History Narrative   Exercise: 2-3 times a wk   Lives alone   Patient drinks 2 cups of caffeine daily.   Patient is right handed.   Social Determinants of Health   Financial Resource Strain: Low Risk  (09/25/2021)   Overall Financial Resource Strain (CARDIA)    Difficulty of Paying Living  Expenses: Not hard at all  Food Insecurity: No Food Insecurity (09/25/2021)   Hunger Vital Sign    Worried About Running Out of Food in the Last Year: Never true    Ran Out of Food in the Last Year: Never true  Transportation Needs: No Transportation Needs (09/25/2021)   PRAPARE - Hydrologist (Medical): No    Lack of Transportation (Non-Medical): No  Physical Activity: Sufficiently Active (09/25/2021)   Exercise Vital Sign    Days of Exercise per Week: 5 days    Minutes of Exercise per Session: 30 min  Stress: No Stress Concern Present (09/25/2021)   [place]    Feeling of Stress : Not at all  Social Connections: Moderately Isolated (09/25/2021)   Social Connection and Isolation Panel [NHANES]    Frequency of Communication with Friends and Family: Twice a week    Frequency of Social Gatherings with Friends and Family: Twice a week    Attends Religious Services: 1 to 4 times per year    Active Member of [employer] or Organizations: No    Attends Archivist Meetings: Never    Marital Status: Divorced  Human resources officer Violence: Not At Risk (09/25/2021)   Humiliation, Afraid, Rape, and Kick questionnaire    Fear of Current or Ex-Partner: No    Emotionally Abused: No    Physically Abused: No    Sexually Abused: No      PHYSICAL EXAM  Vitals:   06/30/22 1029  Weight: 152 lb (68.9 kg)  Height: '5\' 10"'$  (1.778 m)   Body mass index is 21.81 kg/m.     06/30/2022   10:30 AM 12/07/2021    8:16 AM 06/04/2021    1:15 PM  MMSE - Mini Mental State Exam  Orientation to time '4 3 5  '$ Orientation to Place '4 4 4  '$ Registration '3 3 3  '$ Attention/ Calculation 0 1 3  Recall '3 3 2  '$ Language- name 2 objects 0 2 2  Language- repeat 0 1 1  Language- follow 3 step command '3 3 2  '$ Language- read & follow direction '1 1 1  '$ Write a sentence 1  0 1  Copy design 0 0 0  Total score '19 21 24      '$ Generalized: Well developed, in no acute distress   Neurological examination  Mentation: Alert oriented to time, place, history taking. Follows all commands speech and language fluent Cranial nerve II-XII: Pupils were equal round reactive to light. Extraocular movements were full, visual field were full on confrontational test. Facial sensation and strength were normal. Head turning and shoulder shrug  were normal and symmetric. Motor: The motor testing reveals 5 over 5 strength of all 4 extremities. Good symmetric motor tone is noted throughout.  Sensory: Sensory testing is intact to soft touch on all 4 extremities. No evidence of extinction is noted.  Coordination: Cerebellar testing reveals good finger-nose-finger and heel-to-shin bilaterally.  Gait and station: Gait is normal.  Reflexes: Deep tendon reflexes are symmetric and normal bilaterally.   DIAGNOSTIC DATA (LABS, IMAGING, TESTING) - I reviewed patient records, labs, notes, testing and imaging myself where available.  Lab Results  Component Value Date   WBC 7.7 11/10/2021   HGB 15.8 11/10/2021   HCT 47.0 11/10/2021   MCV 91.9 11/10/2021   PLT 181.0 11/10/2021      Component Value Date/Time   NA 137 11/10/2021 1342   NA 137 04/30/2020 1107   K 3.7 11/10/2021 1342   CL 97 11/10/2021 1342   CO2 30 11/10/2021 1342   GLUCOSE 126 (H) 11/10/2021 1342   BUN 10 11/10/2021 1342   BUN 10 04/30/2020 1107   CREATININE 0.97 11/10/2021 1342   CREATININE 0.97 01/08/2016 1010   CALCIUM 10.0 11/10/2021 1342   PROT 8.1 11/10/2021 1342   PROT 8.0 04/30/2020 1107   ALBUMIN 4.8 11/10/2021 1342   ALBUMIN 5.0 (H) 04/30/2020 1107   AST 19 11/10/2021 1342   ALT 18 11/10/2021 1342   ALKPHOS 73 11/10/2021 1342   BILITOT 1.4 (H) 11/10/2021 1342   BILITOT 0.9 04/30/2020 1107   GFRNONAA 69 04/30/2020 1107   GFRNONAA 79 01/08/2016 1010   GFRAA 80 04/30/2020 1107   GFRAA >89 01/08/2016 1010   Lab Results  Component Value Date    CHOL 135 11/10/2021   HDL 60.40 11/10/2021   LDLCALC 64 11/10/2021   TRIG 56.0 11/10/2021   CHOLHDL 2 11/10/2021   Lab Results  Component Value Date   HGBA1C 6.1 11/10/2021   Lab Results  Component Value Date   VITAMINB12 170 (L) 11/10/2021   Lab Results  Component Value Date   TSH 1.38 11/05/2020      ASSESSMENT AND PLAN 77 y.o. year old male  has a past medical history of Basal cell carcinoma of face (X 2), Brow ptosis, GERD (gastroesophageal reflux disease), History of kidney stones, Hyperlipidemia, Hypertension, Prostate cancer ([place]) (1999), Seizures ([place]) (11/2014), Small bowel obstruction due to adhesions ([place]) (01/30/2014), Subdural hematoma ([place]), and Vitamin B12 deficiency (12/02/2020). here with:  1.  Memory disturbance most likely frontotemporal dementia   -- MMSE 19/30 previously 21 out of 30 -- We will continue monitoring symptoms for now --We will follow-up in 6 months or sooner if needed     Ward Givens, MSN, NP-C 06/30/2022, 10:38 AM [facility] [address] [place], [place] [address] [phone]

## 2022-07-29 DIAGNOSIS — Z85828 Personal history of other malignant neoplasm of skin: Secondary | ICD-10-CM | POA: Diagnosis not present

## 2022-07-29 DIAGNOSIS — L570 Actinic keratosis: Secondary | ICD-10-CM | POA: Diagnosis not present

## 2022-07-29 DIAGNOSIS — Z08 Encounter for follow-up examination after completed treatment for malignant neoplasm: Secondary | ICD-10-CM | POA: Diagnosis not present

## 2022-07-29 DIAGNOSIS — D492 Neoplasm of unspecified behavior of bone, soft tissue, and skin: Secondary | ICD-10-CM | POA: Diagnosis not present

## 2022-07-29 DIAGNOSIS — L814 Other melanin hyperpigmentation: Secondary | ICD-10-CM | POA: Diagnosis not present

## 2022-07-29 DIAGNOSIS — D225 Melanocytic nevi of trunk: Secondary | ICD-10-CM | POA: Diagnosis not present

## 2022-07-29 DIAGNOSIS — L989 Disorder of the skin and subcutaneous tissue, unspecified: Secondary | ICD-10-CM | POA: Diagnosis not present

## 2022-07-29 DIAGNOSIS — D045 Carcinoma in situ of skin of trunk: Secondary | ICD-10-CM | POA: Diagnosis not present

## 2022-07-29 DIAGNOSIS — L821 Other seborrheic keratosis: Secondary | ICD-10-CM | POA: Diagnosis not present

## 2022-07-29 DIAGNOSIS — C44712 Basal cell carcinoma of skin of right lower limb, including hip: Secondary | ICD-10-CM | POA: Diagnosis not present

## 2022-08-31 DIAGNOSIS — H2513 Age-related nuclear cataract, bilateral: Secondary | ICD-10-CM | POA: Diagnosis not present

## 2022-08-31 DIAGNOSIS — H11043 Peripheral pterygium, stationary, bilateral: Secondary | ICD-10-CM | POA: Diagnosis not present

## 2022-08-31 DIAGNOSIS — H40013 Open angle with borderline findings, low risk, bilateral: Secondary | ICD-10-CM | POA: Diagnosis not present

## 2022-09-14 ENCOUNTER — Other Ambulatory Visit: Payer: Self-pay | Admitting: Emergency Medicine

## 2022-09-14 DIAGNOSIS — E785 Hyperlipidemia, unspecified: Secondary | ICD-10-CM

## 2022-09-14 DIAGNOSIS — I10 Essential (primary) hypertension: Secondary | ICD-10-CM

## 2022-11-11 ENCOUNTER — Ambulatory Visit (INDEPENDENT_AMBULATORY_CARE_PROVIDER_SITE_OTHER): Payer: Medicare Other | Admitting: Emergency Medicine

## 2022-11-11 ENCOUNTER — Ambulatory Visit (INDEPENDENT_AMBULATORY_CARE_PROVIDER_SITE_OTHER): Payer: Medicare Other

## 2022-11-11 ENCOUNTER — Encounter: Payer: Self-pay | Admitting: Emergency Medicine

## 2022-11-11 VITALS — BP 128/68 | HR 114 | Temp 98.4°F | Ht 70.0 in | Wt 142.1 lb

## 2022-11-11 DIAGNOSIS — E785 Hyperlipidemia, unspecified: Secondary | ICD-10-CM

## 2022-11-11 DIAGNOSIS — R634 Abnormal weight loss: Secondary | ICD-10-CM

## 2022-11-11 DIAGNOSIS — L819 Disorder of pigmentation, unspecified: Secondary | ICD-10-CM | POA: Diagnosis not present

## 2022-11-11 DIAGNOSIS — C4361 Malignant melanoma of right upper limb, including shoulder: Secondary | ICD-10-CM | POA: Diagnosis not present

## 2022-11-11 DIAGNOSIS — I10 Essential (primary) hypertension: Secondary | ICD-10-CM | POA: Diagnosis not present

## 2022-11-11 DIAGNOSIS — Z8546 Personal history of malignant neoplasm of prostate: Secondary | ICD-10-CM

## 2022-11-11 DIAGNOSIS — F03A0 Unspecified dementia, mild, without behavioral disturbance, psychotic disturbance, mood disturbance, and anxiety: Secondary | ICD-10-CM

## 2022-11-11 LAB — CBC WITH DIFFERENTIAL/PLATELET
Basophils Absolute: 0 10*3/uL (ref 0.0–0.1)
Basophils Relative: 0.4 % (ref 0.0–3.0)
Eosinophils Absolute: 0 10*3/uL (ref 0.0–0.7)
Eosinophils Relative: 0.1 % (ref 0.0–5.0)
HCT: 47.2 % (ref 39.0–52.0)
Hemoglobin: 15.5 g/dL (ref 13.0–17.0)
Lymphocytes Relative: 18.3 % (ref 12.0–46.0)
Lymphs Abs: 1 10*3/uL (ref 0.7–4.0)
MCHC: 32.9 g/dL (ref 30.0–36.0)
MCV: 92.9 fl (ref 78.0–100.0)
Monocytes Absolute: 0.4 10*3/uL (ref 0.1–1.0)
Monocytes Relative: 7.2 % (ref 3.0–12.0)
Neutro Abs: 4.1 10*3/uL (ref 1.4–7.7)
Neutrophils Relative %: 74 % (ref 43.0–77.0)
Platelets: 188 10*3/uL (ref 150.0–400.0)
RBC: 5.08 Mil/uL (ref 4.22–5.81)
RDW: 13.5 % (ref 11.5–15.5)
WBC: 5.6 10*3/uL (ref 4.0–10.5)

## 2022-11-11 LAB — LIPID PANEL
Cholesterol: 128 mg/dL (ref 0–200)
HDL: 60.7 mg/dL (ref >39.00)
LDL Cholesterol: 55 mg/dL (ref 0–99)
NonHDL: 67.66
Total CHOL/HDL Ratio: 2
Triglycerides: 63 mg/dL (ref 0.0–149.0)
VLDL: 12.6 mg/dL (ref 0.0–40.0)

## 2022-11-11 LAB — HEMOGLOBIN A1C: Hgb A1c MFr Bld: 6.1 % (ref 4.6–6.5)

## 2022-11-11 LAB — URINALYSIS, ROUTINE W REFLEX MICROSCOPIC
Bilirubin Urine: NEGATIVE
Ketones, ur: NEGATIVE
Leukocytes,Ua: NEGATIVE
Nitrite: NEGATIVE
Specific Gravity, Urine: 1.02 (ref 1.000–1.030)
Total Protein, Urine: 100 — AB
Urine Glucose: NEGATIVE
Urobilinogen, UA: 0.2 (ref 0.0–1.0)
pH: 6.5 (ref 5.0–8.0)

## 2022-11-11 LAB — COMPREHENSIVE METABOLIC PANEL
ALT: 22 U/L (ref 0–53)
AST: 21 U/L (ref 0–37)
Albumin: 4.8 g/dL (ref 3.5–5.2)
Alkaline Phosphatase: 73 U/L (ref 39–117)
BUN: 8 mg/dL (ref 6–23)
CO2: 28 mEq/L (ref 19–32)
Calcium: 9.7 mg/dL (ref 8.4–10.5)
Chloride: 97 mEq/L (ref 96–112)
Creatinine, Ser: 1.06 mg/dL (ref 0.40–1.50)
GFR: 67.73 mL/min (ref >60.00)
Glucose, Bld: 130 mg/dL — ABNORMAL HIGH (ref 70–99)
Potassium: 4.3 mEq/L (ref 3.5–5.1)
Sodium: 136 mEq/L (ref 135–145)
Total Bilirubin: 1.3 mg/dL — ABNORMAL HIGH (ref 0.2–1.2)
Total Protein: 8.4 g/dL — ABNORMAL HIGH (ref 6.0–8.3)

## 2022-11-11 LAB — VITAMIN D 25 HYDROXY (VIT D DEFICIENCY, FRACTURES): VITD: 29.83 ng/mL — ABNORMAL LOW (ref 30.00–100.00)

## 2022-11-11 LAB — VITAMIN B12: Vitamin B-12: 614 pg/mL (ref 211–911)

## 2022-11-11 LAB — TSH: TSH: 1.56 u[IU]/mL (ref 0.35–5.50)

## 2022-11-11 NOTE — Patient Instructions (Signed)
Preventing Consequences of Unhealthy Weight Loss Behaviors, Adult Reaching and maintaining a healthy weight is important for your overall health. A healthy weight will vary from person to person. It is natural to want to lose weight quickly, using whatever methods seem fastest. However, losing weight in a healthy way is not a quick process. Instead, aim for slow, steady weight loss by making small changes and setting achievable goals. How can unhealthy weight loss behaviors affect me? Using unhealthy behaviors to try to lose weight can cause: Tiredness (fatigue), low heart rate, and low blood pressure. Imbalances in your body. These may be imbalances in: Electrolytes. These are salts and minerals in your blood. Chemicals. These are needed so your body can work properly. Body fluids. Loss of fluids may lead to dehydration. Organ damage or organ failure, especially affecting the kidneys. Thin bones that break easily. Loneliness or relationship problems with your friends and family. Emotional problems, including depression and anxiety. Changing unhealthy weight loss behaviors through lifestyle changes will improve your overall health. Maintaining a healthy weight also lowers your risk of certain conditions, such as: Obesity. Heart disease, high cholesterol, and high blood pressure. Type 2 diabetes. Breathing and sleeping disorders. Stroke. Osteoarthritis. This affects your joints. Osteoporosis. This affects your bones. Some cancers. What can increase my risk? Certain views or feelings about yourself and certain habits can increase your risk of unhealthy weight loss behaviors. These include: Having depression and being overweight as an adult. Attempting weight loss as a child or teen. Using alcohol, drugs, or tobacco products. What actions can I take to prevent these behaviors? You can make certain lifestyle changes to help you lose weight in a healthy way. These include eating nutritious  foods and exercising regularly. Nutrition  Eat a variety of healthy foods, including fruits and vegetables, whole grains, lean proteins, and low-fat dairy products. Drink water instead of sugary drinks such as juice, soda, or sports drinks. Drink enough fluid to keep your urine pale yellow. Plan healthy, balanced meals. Work with a Data processing manager to make a healthy meal plan that works for you. Limit the following: Foods that are high in fat, salt (sodium), or sugar. These include candy, donuts, pizza, and fast foods. Fried or heavily processed foods. Lifestyle Avoid these unhealthy eating habits: Following a diet that restricts entire types of food. This may be a popular diet that promises extreme results in a short time. Skipping meals to save calories. Not eating anything for long periods of time (fasting). Restricting your calories to far fewer than the number that you need to lose or maintain a healthy weight. Taking laxative pills to make you have more frequent bowel movements. Taking medicines to make your body lose excess fluids (diuretics). Eating an excessive amount of food and then making yourself vomit. This is known as bingeing and purging. Do not use any products that contain nicotine or tobacco. These products include cigarettes, chewing tobacco, and vaping devices, such as e-cigarettes. If you need help quitting, ask your health care provider. Alcohol use Do not drink alcohol if: Your health care provider tells you not to drink. You are pregnant, may be pregnant, or are planning to become pregnant. If you drink alcohol: Limit how much you have to: 0-1 drink a day for women. 0-2 drinks a day for men. Know how much alcohol is in your drink. In the [place] one drink equals one 12 oz bottle of beer (355 mL), one 5 oz glass of wine (148 mL), or one 1  oz glass of hard liquor (44 mL). Activity  Avoid compulsively getting an extreme amount of exercise. Work with a Data processing manager to make a  healthy exercise program. Include different types of exercise in your exercise program, such as strengthening, aerobic, and flexibility exercises. To maintain your weight, get at least 150 minutes of moderate-intensity exercise each week. Moderate-intensity exercise could be brisk walking or biking. To lose a healthy amount of weight, get 60 minutes of moderate-intensity exercise each day. Find ways to reduce stress, such as regular exercise or meditation. Find a hobby or other activity that you enjoy to distract you from eating when you feel stressed or bored. Where to find support For more support, talk with: Your health care provider or dietitian. Ask about support groups. A mental health care provider. Family and friends. Where to find more information Learn more about how to prevent complications from unhealthy weight loss behaviors from: Centers for Disease Control and Prevention: [employer]Exhibition.com.br [employer] of Mental Health: http://www.maynard.net/ National Eating Disorders Association: www.nationaleatingdisorders.org Contact a health care provider if: You often feel very tired. You notice changes in your skin or your hair. You faint because of dehydration or too much exercise. You struggle to change your unhealthy weight loss behaviors on your own. Unhealthy weight loss behaviors are affecting your daily life or your relationships. You have signs or symptoms of an eating disorder. You have major weight changes in a short period of time. You feel guilty or ashamed about eating or exercising. Summary Using unhealthy eating behaviors to try to lose weight can cause a variety of physical and emotional problems that affect your overall health and well-being. Aim for slow, steady weight loss by choosing healthy foods, avoiding unhealthy eating habits, and exercising regularly. Contact your health care provider if you struggle to change your behaviors on your own or if you think that you may  have an eating disorder. This information is not intended to replace advice given to you by your health care provider. Make sure you discuss any questions you have with your health care provider. Document Revised: 01/13/2021 Document Reviewed: 01/13/2021 Elsevier Patient Education  2024 [employer].

## 2022-11-11 NOTE — Assessment & Plan Note (Signed)
Multiple lesions.  Needs dermatology evaluation. Patient has established care with local dermatologist.  Will follow-up with them.

## 2022-11-11 NOTE — Assessment & Plan Note (Signed)
Stable.  No behavioral disturbance.

## 2022-11-11 NOTE — Assessment & Plan Note (Signed)
Differential diagnosis discussed including possibility of malignancy. Patient has cancer history, melanoma and prostate. Blood work done today. Negative chest x-ray done today Needs CT scan of abdomen and pelvis

## 2022-11-11 NOTE — Assessment & Plan Note (Signed)
BP Readings from Last 3 Encounters:  11/11/22 128/68  06/30/22 (!) 146/77  05/13/22 134/72  Well-controlled hypertension Continue amlodipine 10 mg daily and Hyzaar 100-12.5 mg daily

## 2022-11-11 NOTE — Progress Notes (Signed)
Martin Edwards 77 y.o.   Chief Complaint  Patient presents with   Medical Management of Chronic Issues    f/u appt,    HISTORY OF PRESENT ILLNESS: This is a 77 y.o. male here for 25-month follow-up of chronic medical problems Accompanied by son. Loosing weight. No other complaint or medical concerns today.  HPI   Prior to Admission medications   Medication Sig Start Date End Date Taking? Authorizing Provider  amLODipine (NORVASC) 10 MG tablet TAKE 1 TABLET BY MOUTH DAILY 09/14/22  Yes Beaux Verne, Eilleen Kempf, MD  atorvastatin (LIPITOR) 40 MG tablet TAKE 1 TABLET BY MOUTH DAILY 09/14/22  Yes Taler Kushner, Eilleen Kempf, MD  Cyanocobalamin (B-12 PO) Take by mouth.   Yes [provider]  famotidine (PEPCID) 20 MG tablet TAKE 1 TABLET BY MOUTH TWICE  DAILY AS NEEDED FOR HEARTBURN OR INDIGESTION 01/11/22  Yes Jose Alleyne, Eilleen Kempf, MD  losartan-hydrochlorothiazide [facility]) 100-12.5 MG tablet TAKE 1 TABLET BY MOUTH DAILY 09/14/22  Yes Leanda Padmore, Eilleen Kempf, MD  pantoprazole (PROTONIX) 40 MG tablet TAKE 1 TABLET BY MOUTH DAILY 01/11/22  Yes Georgina Quint, MD    Allergies  Allergen Reactions   Other Rash    Needs Hypoallergenic Sheets/ Laundry Cart had questionable allergic reaction to detergent last time he was here.    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/26/2016   History of cranioplasty 03/07/2015   Seizures (HCC) 11/27/2014   Fine motor impairment 11/19/2014   Prostate cancer (HCC) 12/12/2012   Essential hypertension 10/21/2011    Dyslipidemia 10/21/2011   Adenomatous colon polyp 10/21/2011    Past Medical History:  Diagnosis Date   Basal cell carcinoma of face X 2   Brow ptosis    GERD (gastroesophageal reflux disease)    History of kidney stones    Hyperlipidemia    Hypertension    Prostate cancer (HCC) 1999   Seizures (HCC) 11/2014   brain trauma -hematoma   Small bowel obstruction due to adhesions (HCC) 01/30/2014   Subdural hematoma (HCC)    Vitamin B12 deficiency 12/02/2020    Past Surgical History:  Procedure Laterality Date   APPLICATION OF A-CELL OF HEAD/NECK N/A 08/29/2017   Procedure: APPLICATION OF A-CELL;  Surgeon: Peggye Form, DO;  Location: [facility] OR;  Service: Plastics;  Laterality: N/A;   BASAL CELL CARCINOMA EXCISION  X 2   "off my face both times"   BRAIN SURGERY     COLECTOMY  10/2010   "took out 8 inches of colon; wasn't cancer"   CRANIOTOMY Right 11/11/2014   Procedure: CRANIOTOMY HEMATOMA EVACUATION SUBDURAL;  Surgeon: Hilda Lias, MD;  Location: [facility];  Service: Neurosurgery;  Laterality: Right;   CRANIOTOMY Right 11/13/2014   Procedure: Removal bone flap, insertion into the abdominal wall;  Surgeon: Hilda Lias, MD;  Location: [facility];  Service: Neurosurgery;  Laterality: Right;   CRANIOTOMY N/A 03/07/2015   Procedure: Cranioplasty using Bone flap in abdomen;  Surgeon: Hilda Lias, MD;  Location: [facility];  Service: Neurosurgery;  Laterality: N/A;  Cranioplasty using Bone flap in abdomen  CRANIOTOMY N/A 08/29/2017   Procedure: CRANIECTOMY AND DEBRIDEMENT;  Surgeon: Shirlean Kelly, MD;  Location: [facility] OR;  Service: Neurosurgery;  Laterality: N/A;  CRANIECTOMY AND DEBRIDEMENT   EXCISION MASS NECK Right 04/14/2016   Procedure: EXCISION BIOPSY OF RIGHT NECK LYMPH NODE;  Surgeon: Osborn Coho, MD;  Location: [facility] OR;  Service: ENT;  Laterality: Right;   EXCISION MELANOMA WITH SENTINEL LYMPH NODE BIOPSY Right 04/14/2016   Procedure: WIDE EXCISION MELANOMA RIGHT  SHOULDER WITH SENTINEL  NODE MAPPING;  Surgeon: Chevis Pretty III, MD;  Location: [facility] OR;  Service: General;  Laterality: Right;   PROSTATECTOMY  05/20/1998   SCALP LACERATION REPAIR N/A 08/29/2017   Procedure: SCALP FLAP TO COVER DEFECT;  Surgeon: Peggye Form, DO;  Location: [facility] OR;  Service: Plastics;  Laterality: N/A;    Social History   Socioeconomic History   Marital status: Divorced    Spouse name: Not on file   Number of children: 2   Years of education: 12   Highest education level: Not on file  Occupational History   Occupation: retired  Tobacco Use   Smoking status: Former   Smokeless tobacco: Never  Building services engineer Use: Never used  Substance and Sexual Activity   Alcohol use: Yes    Alcohol/week: 0.0 standard drinks of alcohol    Comment: 01/30/2014 "maybe a beer twice/yr"   Drug use: No   Sexual activity: Not Currently  Other Topics Concern   Not on file  Social History Narrative   Exercise: 2-3 times a wk   Lives alone   Patient drinks 2 cups of caffeine daily.   Patient is right handed.   Social Determinants of Health   Financial Resource Strain: Low Risk  (09/25/2021)   Overall Financial Resource Strain (CARDIA)    Difficulty of Paying Living Expenses: Not hard at all  Food Insecurity: No Food Insecurity (09/25/2021)   Hunger Vital Sign    Worried About Running Out of Food in the Last Year: Never true    Ran Out of Food in the Last Year: Never true  Transportation Needs: No Transportation Needs (09/25/2021)   PRAPARE - Administrator, Civil Service (Medical): No    Lack of Transportation (Non-Medical): No  Physical Activity: Sufficiently Active (09/25/2021)   Exercise Vital Sign    Days of Exercise per Week: 5 days    Minutes of Exercise per Session: 30 min  Stress: No Stress Concern Present (09/25/2021)   [employer] of Occupational Health - Occupational Stress Questionnaire    Feeling of Stress : Not at all  Social Connections:  Moderately Isolated (09/25/2021)   Social Connection and Isolation Panel [NHANES]    Frequency of Communication with Friends and Family: Twice a week    Frequency of Social Gatherings with Friends and Family: Twice a week    Attends Religious Services: 1 to 4 times per year    Active Member of [employer] or Organizations: No    Attends Banker Meetings: Never    Marital Status: Divorced  Catering manager Violence: Not At Risk (09/25/2021)   Humiliation, Afraid, Rape, and Kick questionnaire    Fear of Current or Ex-Partner: No    Emotionally Abused: No    Physically Abused: No    Sexually Abused: No    Family History  Problem Relation Age of Onset   Cancer Father        Lung   Alzheimer's disease Sister  Dementia Neg Hx      Review of Systems  Constitutional:  Positive for weight loss.  HENT: Negative.  Negative for congestion and sore throat.   Respiratory: Negative.  Negative for cough and shortness of breath.   Cardiovascular: Negative.  Negative for chest pain and palpitations.  Gastrointestinal:  Negative for abdominal pain, blood in stool, diarrhea, nausea and vomiting.  Genitourinary: Negative.  Negative for dysuria and hematuria.  Musculoskeletal: Negative.   Skin:        Multiple hyperpigmented skin lesions on his back  Neurological: Negative.  Negative for dizziness and headaches.    Vitals:   11/11/22 0955  BP: 128/68  Pulse: (!) 114  Temp: 98.4 F (36.9 C)  SpO2: 97%   Wt Readings from Last 3 Encounters:  11/11/22 142 lb 2 oz (64.5 kg)  06/30/22 152 lb (68.9 kg)  05/13/22 153 lb 4 oz (69.5 kg)     Physical Exam Vitals reviewed.  Constitutional:      Appearance: Normal appearance.  HENT:     Head: Normocephalic.     Mouth/Throat:     Mouth: Mucous membranes are moist.     Pharynx: Oropharynx is clear.  Eyes:     Extraocular Movements: Extraocular movements intact.     Pupils: Pupils are equal, round, and reactive to light.   Cardiovascular:     Rate and Rhythm: Normal rate and regular rhythm.     Pulses: Normal pulses.     Heart sounds: Normal heart sounds.  Pulmonary:     Effort: Pulmonary effort is normal.     Breath sounds: Normal breath sounds.  Abdominal:     Palpations: Abdomen is soft. There is no mass.     Tenderness: There is no abdominal tenderness.  Musculoskeletal:     Cervical back: No tenderness.     Right lower leg: No edema.     Left lower leg: No edema.  Lymphadenopathy:     Cervical: No cervical adenopathy.  Skin:    General: Skin is warm and dry.     Capillary Refill: Capillary refill takes less than 2 seconds.     Comments: Multiple hyperpigmented skin lesions to his trunk, mostly back  Neurological:     General: No focal deficit present.     Mental Status: He is alert and oriented to person, place, and time.  Psychiatric:        Mood and Affect: Mood normal.        Behavior: Behavior normal.    DG Chest 2 View  Result Date: 11/11/2022 CLINICAL DATA:  Weight loss. EXAM: CHEST - 2 VIEW COMPARISON:  Chest x-ray May 30, 23. FINDINGS: The heart size and mediastinal contours are within normal limits. Both lungs are clear. No visible pleural effusions or pneumothorax. No acute osseous abnormality. IMPRESSION: No active cardiopulmonary disease. Electronically Signed   By: Feliberto Harts M.D.   On: 11/11/2022 11:03     ASSESSMENT & PLAN: A total of 46 minutes was spent with the patient and counseling/coordination of care regarding preparing for this visit, review of most recent office visit notes, review of multiple chronic medical conditions under management, review of all medications, differential diagnosis of weight loss and need for workup, education on nutrition, review of chest x-ray done today, prognosis, documentation, need for follow-up.  Problem List Items Addressed This Visit       Cardiovascular and Mediastinum   Essential hypertension    BP Readings from Last 3  Encounters:  11/11/22 128/68  06/30/22 (!) 146/77  05/13/22 134/72  Well-controlled hypertension Continue amlodipine 10 mg daily and Hyzaar 100-12.5 mg daily         Nervous and Auditory   Mild dementia without behavioral disturbance, psychotic disturbance, mood disturbance, or anxiety (HCC)    Stable.  No behavioral disturbance.        Musculoskeletal and Integument   Hyperpigmented skin lesion    Multiple lesions.  Needs dermatology evaluation. Patient has established care with local dermatologist.  Will follow-up with them.        Other   Dyslipidemia    Chronic stable condition.  Continue atorvastatin 40 mg daily.      Malignant melanoma of right shoulder (HCC)    Multiple hyperpigmented skin lesions on his back.  Needs evaluation by dermatologist.      Weight loss - Primary    Differential diagnosis discussed including possibility of malignancy. Patient has cancer history, melanoma and prostate. Blood work done today. Negative chest x-ray done today Needs CT scan of abdomen and pelvis      Relevant Orders   Urinalysis   CBC with Differential/Platelet   Comprehensive metabolic panel   Hemoglobin A1c   Lipid panel   Vitamin B12   TSH   VITAMIN D 25 Hydroxy (Vit-D Deficiency, Fractures)   DG Chest 2 View (Completed)   CT Abdomen Pelvis W Contrast   PSA   Other Visit Diagnoses     History of prostate cancer       Relevant Orders   PSA      Patient Instructions  Preventing Consequences of Unhealthy Weight Loss Behaviors, Adult Reaching and maintaining a healthy weight is important for your overall health. A healthy weight will vary from person to person. It is natural to want to lose weight quickly, using whatever methods seem fastest. However, losing weight in a healthy way is not a quick process. Instead, aim for slow, steady weight loss by making small changes and setting achievable goals. How can unhealthy weight loss behaviors affect me? Using  unhealthy behaviors to try to lose weight can cause: Tiredness (fatigue), low heart rate, and low blood pressure. Imbalances in your body. These may be imbalances in: Electrolytes. These are salts and minerals in your blood. Chemicals. These are needed so your body can work properly. Body fluids. Loss of fluids may lead to dehydration. Organ damage or organ failure, especially affecting the kidneys. Thin bones that break easily. Loneliness or relationship problems with your friends and family. Emotional problems, including depression and anxiety. Changing unhealthy weight loss behaviors through lifestyle changes will improve your overall health. Maintaining a healthy weight also lowers your risk of certain conditions, such as: Obesity. Heart disease, high cholesterol, and high blood pressure. Type 2 diabetes. Breathing and sleeping disorders. Stroke. Osteoarthritis. This affects your joints. Osteoporosis. This affects your bones. Some cancers. What can increase my risk? Certain views or feelings about yourself and certain habits can increase your risk of unhealthy weight loss behaviors. These include: Having depression and being overweight as an adult. Attempting weight loss as a child or teen. Using alcohol, drugs, or tobacco products. What actions can I take to prevent these behaviors? You can make certain lifestyle changes to help you lose weight in a healthy way. These include eating nutritious foods and exercising regularly. Nutrition  Eat a variety of healthy foods, including fruits and vegetables, whole grains, lean proteins, and low-fat dairy products. Drink water instead of sugary  drinks such as juice, soda, or sports drinks. Drink enough fluid to keep your urine pale yellow. Plan healthy, balanced meals. Work with a Data processing manager to make a healthy meal plan that works for you. Limit the following: Foods that are high in fat, salt (sodium), or sugar. These include candy, donuts,  pizza, and fast foods. Fried or heavily processed foods. Lifestyle Avoid these unhealthy eating habits: Following a diet that restricts entire types of food. This may be a popular diet that promises extreme results in a short time. Skipping meals to save calories. Not eating anything for long periods of time (fasting). Restricting your calories to far fewer than the number that you need to lose or maintain a healthy weight. Taking laxative pills to make you have more frequent bowel movements. Taking medicines to make your body lose excess fluids (diuretics). Eating an excessive amount of food and then making yourself vomit. This is known as bingeing and purging. Do not use any products that contain nicotine or tobacco. These products include cigarettes, chewing tobacco, and vaping devices, such as e-cigarettes. If you need help quitting, ask your health care provider. Alcohol use Do not drink alcohol if: Your health care provider tells you not to drink. You are pregnant, may be pregnant, or are planning to become pregnant. If you drink alcohol: Limit how much you have to: 0-1 drink a day for women. 0-2 drinks a day for men. Know how much alcohol is in your drink. In the [place] one drink equals one 12 oz bottle of beer (355 mL), one 5 oz glass of wine (148 mL), or one 1 oz glass of hard liquor (44 mL). Activity  Avoid compulsively getting an extreme amount of exercise. Work with a Data processing manager to make a healthy exercise program. Include different types of exercise in your exercise program, such as strengthening, aerobic, and flexibility exercises. To maintain your weight, get at least 150 minutes of moderate-intensity exercise each week. Moderate-intensity exercise could be brisk walking or biking. To lose a healthy amount of weight, get 60 minutes of moderate-intensity exercise each day. Find ways to reduce stress, such as regular exercise or meditation. Find a hobby or other activity that  you enjoy to distract you from eating when you feel stressed or bored. Where to find support For more support, talk with: Your health care provider or dietitian. Ask about support groups. A mental health care provider. Family and friends. Where to find more information Learn more about how to prevent complications from unhealthy weight loss behaviors from: Centers for Disease Control and Prevention: FootballExhibition.com.br [employer] of Mental Health: http://www.maynard.net/ National Eating Disorders Association: www.nationaleatingdisorders.org Contact a health care provider if: You often feel very tired. You notice changes in your skin or your hair. You faint because of dehydration or too much exercise. You struggle to change your unhealthy weight loss behaviors on your own. Unhealthy weight loss behaviors are affecting your daily life or your relationships. You have signs or symptoms of an eating disorder. You have major weight changes in a short period of time. You feel guilty or ashamed about eating or exercising. Summary Using unhealthy eating behaviors to try to lose weight can cause a variety of physical and emotional problems that affect your overall health and well-being. Aim for slow, steady weight loss by choosing healthy foods, avoiding unhealthy eating habits, and exercising regularly. Contact your health care provider if you struggle to change your behaviors on your own or if you think that you may  have an eating disorder. This information is not intended to replace advice given to you by your health care provider. Make sure you discuss any questions you have with your health care provider. Document Revised: 01/13/2021 Document Reviewed: 01/13/2021 Elsevier Patient Education  2024 Elsevier Inc.   Edwina Barth, MD [facility]

## 2022-11-11 NOTE — Assessment & Plan Note (Signed)
Chronic stable condition Continue atorvastatin 40 mg daily 

## 2022-11-11 NOTE — Assessment & Plan Note (Signed)
Multiple hyperpigmented skin lesions on his back.  Needs evaluation by dermatologist.

## 2022-11-15 DIAGNOSIS — Z86007 Personal history of in-situ neoplasm of skin: Secondary | ICD-10-CM | POA: Diagnosis not present

## 2022-11-15 DIAGNOSIS — L578 Other skin changes due to chronic exposure to nonionizing radiation: Secondary | ICD-10-CM | POA: Diagnosis not present

## 2022-11-15 DIAGNOSIS — L570 Actinic keratosis: Secondary | ICD-10-CM | POA: Diagnosis not present

## 2022-11-15 DIAGNOSIS — L02212 Cutaneous abscess of back [any part, except buttock]: Secondary | ICD-10-CM | POA: Diagnosis not present

## 2022-11-15 DIAGNOSIS — Z08 Encounter for follow-up examination after completed treatment for malignant neoplasm: Secondary | ICD-10-CM | POA: Diagnosis not present

## 2022-11-15 DIAGNOSIS — Z85828 Personal history of other malignant neoplasm of skin: Secondary | ICD-10-CM | POA: Diagnosis not present

## 2022-11-15 DIAGNOSIS — L821 Other seborrheic keratosis: Secondary | ICD-10-CM | POA: Diagnosis not present

## 2022-11-22 ENCOUNTER — Ambulatory Visit
Admission: RE | Admit: 2022-11-22 | Discharge: 2022-11-22 | Disposition: A | Discharge status: Home / Self Care | Payer: Medicare Other | Source: Ambulatory Visit | Attending: Emergency Medicine | Admitting: Emergency Medicine

## 2022-11-22 DIAGNOSIS — R634 Abnormal weight loss: Secondary | ICD-10-CM | POA: Diagnosis not present

## 2022-11-22 DIAGNOSIS — I2510 Atherosclerotic heart disease of native coronary artery without angina pectoris: Secondary | ICD-10-CM | POA: Diagnosis not present

## 2022-11-22 DIAGNOSIS — I2584 Coronary atherosclerosis due to calcified coronary lesion: Secondary | ICD-10-CM | POA: Diagnosis not present

## 2022-11-22 MED ORDER — IOPAMIDOL (ISOVUE-300) INJECTION 61%
100.0000 mL | Freq: Once | INTRAVENOUS | Status: AC | PRN
  Administered 2022-11-22: 100 mL via INTRAVENOUS

## 2022-11-30 ENCOUNTER — Ambulatory Visit (INDEPENDENT_AMBULATORY_CARE_PROVIDER_SITE_OTHER): Payer: Medicare Other | Admitting: Emergency Medicine

## 2022-11-30 ENCOUNTER — Encounter: Payer: Self-pay | Admitting: Emergency Medicine

## 2022-11-30 VITALS — BP 134/72 | HR 100 | Temp 98.4°F | Ht 70.0 in | Wt 145.4 lb

## 2022-11-30 DIAGNOSIS — R634 Abnormal weight loss: Secondary | ICD-10-CM

## 2022-11-30 DIAGNOSIS — Z8546 Personal history of malignant neoplasm of prostate: Secondary | ICD-10-CM | POA: Diagnosis not present

## 2022-11-30 DIAGNOSIS — I10 Essential (primary) hypertension: Secondary | ICD-10-CM | POA: Diagnosis not present

## 2022-11-30 DIAGNOSIS — Z85820 Personal history of malignant melanoma of skin: Secondary | ICD-10-CM | POA: Diagnosis not present

## 2022-11-30 DIAGNOSIS — F03A0 Unspecified dementia, mild, without behavioral disturbance, psychotic disturbance, mood disturbance, and anxiety: Secondary | ICD-10-CM | POA: Diagnosis not present

## 2022-11-30 LAB — PSA: PSA: 0.46 ng/mL (ref 0.10–4.00)

## 2022-11-30 NOTE — Assessment & Plan Note (Signed)
Stable.  No concerns. °

## 2022-11-30 NOTE — Assessment & Plan Note (Signed)
Eating better.  Gained a couple of pounds. Unremarkable blood work and urinalysis Unremarkable chest x-ray Unremarkable CT scan of abdomen and pelvis Negative workup for malignancy

## 2022-11-30 NOTE — Progress Notes (Signed)
Martin Edwards 77 y.o.   Chief Complaint  Patient presents with   CT Follow UP     No other concerns     HISTORY OF PRESENT ILLNESS: This is a 77 y.o. male here for 2-week follow-up.  Accompanied by son. Seen by me on 11/11/2022.  Blood work and urinalysis done Chest x-ray done.  CT scan of abdomen and pelvis done Had some unintentional weight loss Unremarkable workup Has history of prostate cancer about 20 years ago History of colonic polyps with partial large bowel resection Overall doing well.  Gained some weight.  Drinking Ensure milkshakes No complaints or medical concerns today.  Wt Readings from Last 3 Encounters:  11/30/22 145 lb 6.4 oz (66 kg)  11/11/22 142 lb 2 oz (64.5 kg)  06/30/22 152 lb (68.9 kg)     HPI   Prior to Admission medications   Medication Sig Start Date End Date Taking? Authorizing Provider  amLODipine (NORVASC) 10 MG tablet TAKE 1 TABLET BY MOUTH DAILY 09/14/22  Yes Elif Yonts, Eilleen Kempf, MD  atorvastatin (LIPITOR) 40 MG tablet TAKE 1 TABLET BY MOUTH DAILY 09/14/22  Yes Sricharan Lacomb, Eilleen Kempf, MD  Cyanocobalamin (B-12 PO) Take by mouth.   Yes [provider]  famotidine (PEPCID) 20 MG tablet TAKE 1 TABLET BY MOUTH TWICE  DAILY AS NEEDED FOR HEARTBURN OR INDIGESTION 01/11/22  Yes Sheddrick Lattanzio, Eilleen Kempf, MD  losartan-hydrochlorothiazide [facility]) 100-12.5 MG tablet TAKE 1 TABLET BY MOUTH DAILY 09/14/22  Yes Lubertha Leite, Eilleen Kempf, MD  pantoprazole (PROTONIX) 40 MG tablet TAKE 1 TABLET BY MOUTH DAILY 01/11/22  Yes Georgina Quint, MD    Allergies  Allergen Reactions   Other Rash    Needs Hypoallergenic Sheets/ Laundry Cart had questionable allergic reaction to detergent last time he was here.    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   History of cranioplasty 03/07/2015   Seizures (HCC) 11/27/2014   Fine motor impairment 11/19/2014   Prostate cancer (HCC) 12/12/2012   Essential hypertension 10/21/2011   Dyslipidemia 10/21/2011   Adenomatous colon polyp 10/21/2011    Past Medical History:  Diagnosis Date   Basal cell carcinoma of face X 2   Brow ptosis    GERD (gastroesophageal reflux disease)    History of kidney stones    Hyperlipidemia    Hypertension    Prostate cancer (HCC) 1999   Seizures (HCC) 11/2014   brain trauma -hematoma   Small bowel obstruction due to adhesions (HCC) 01/30/2014   Subdural hematoma (HCC)    Vitamin B12 deficiency 12/02/2020    Past Surgical History:  Procedure Laterality Date   APPLICATION OF A-CELL OF HEAD/NECK N/A 08/29/2017   Procedure: APPLICATION OF A-CELL;  Surgeon: Peggye Form, DO;  Location: [facility] OR;  Service: Plastics;  Laterality: N/A;   BASAL CELL CARCINOMA EXCISION  X 2   "off my face both times"   BRAIN SURGERY     COLECTOMY  10/2010   "took out 8 inches of colon; wasn't cancer"   CRANIOTOMY  Right 11/11/2014   Procedure: CRANIOTOMY HEMATOMA EVACUATION SUBDURAL;  Surgeon: Hilda Lias, MD;  Location: [facility];  Service: Neurosurgery;  Laterality: Right;   CRANIOTOMY Right 11/13/2014   Procedure: Removal bone flap, insertion into the abdominal wall;  Surgeon: Hilda Lias, MD;  Location: [facility];  Service: Neurosurgery;  Laterality: Right;   CRANIOTOMY N/A 03/07/2015   Procedure: Cranioplasty using Bone flap in abdomen;  Surgeon: Hilda Lias, MD;  Location: [facility];  Service: Neurosurgery;  Laterality: N/A;  Cranioplasty using Bone flap in abdomen    CRANIOTOMY N/A 08/29/2017   Procedure: CRANIECTOMY AND DEBRIDEMENT;  Surgeon: Shirlean Kelly, MD;  Location: [facility] OR;  Service: Neurosurgery;  Laterality: N/A;  CRANIECTOMY AND DEBRIDEMENT   EXCISION MASS NECK Right 04/14/2016   Procedure: EXCISION BIOPSY OF RIGHT NECK LYMPH NODE;  Surgeon: Osborn Coho, MD;  Location: [facility] OR;  Service: ENT;  Laterality: Right;   EXCISION MELANOMA WITH SENTINEL LYMPH NODE BIOPSY Right 04/14/2016   Procedure: WIDE EXCISION MELANOMA RIGHT SHOULDER WITH SENTINEL  NODE MAPPING;  Surgeon: Chevis Pretty III, MD;  Location: [facility] OR;  Service: General;  Laterality: Right;   PROSTATECTOMY  05/20/1998   SCALP LACERATION REPAIR N/A 08/29/2017   Procedure: SCALP FLAP TO COVER DEFECT;  Surgeon: Peggye Form, DO;  Location: [facility] OR;  Service: Plastics;  Laterality: N/A;    Social History   Socioeconomic History   Marital status: Divorced    Spouse name: Not on file   Number of children: 2   Years of education: 12   Highest education level: Not on file  Occupational History   Occupation: retired  Tobacco Use   Smoking status: Former   Smokeless tobacco: Never  Building services engineer Use: Never used  Substance and Sexual Activity   Alcohol use: Yes    Alcohol/week: 0.0 standard drinks of alcohol    Comment: 01/30/2014 "maybe a beer twice/yr"   Drug use: No   Sexual activity: Not Currently  Other Topics Concern   Not on file  Social History Narrative   Exercise: 2-3 times a wk   Lives alone   Patient drinks 2 cups of caffeine daily.   Patient is right handed.   Social Determinants of Health   Financial Resource Strain: Low Risk  (09/25/2021)   Overall Financial Resource Strain (CARDIA)    Difficulty of Paying Living Expenses: Not hard at all  Food Insecurity: No Food Insecurity (09/25/2021)   Hunger Vital Sign    Worried About Running Out of Food in the Last Year: Never true    Ran Out of Food in the Last Year: Never true  Transportation Needs: No  Transportation Needs (09/25/2021)   PRAPARE - Administrator, Civil Service (Medical): No    Lack of Transportation (Non-Medical): No  Physical Activity: Sufficiently Active (09/25/2021)   Exercise Vital Sign    Days of Exercise per Week: 5 days    Minutes of Exercise per Session: 30 min  Stress: No Stress Concern Present (09/25/2021)   [employer] of Occupational Health - Occupational Stress Questionnaire    Feeling of Stress : Not at all  Social Connections: Moderately Isolated (09/25/2021)   Social Connection and Isolation Panel [NHANES]    Frequency of Communication with Friends and Family: Twice a week    Frequency of Social Gatherings with Friends and Family: Twice a week    Attends Religious Services: 1 to 4 times per year  Active Member of Clubs or Organizations: No    Attends Banker Meetings: Never    Marital Status: Divorced  Catering manager Violence: Not At Risk (09/25/2021)   Humiliation, Afraid, Rape, and Kick questionnaire    Fear of Current or Ex-Partner: No    Emotionally Abused: No    Physically Abused: No    Sexually Abused: No    Family History  Problem Relation Age of Onset   Cancer Father        Lung   Alzheimer's disease Sister    Dementia Neg Hx      Review of Systems  Constitutional: Negative.  Negative for chills and fever.  HENT: Negative.  Negative for congestion and sore throat.   Respiratory: Negative.  Negative for cough and shortness of breath.   Cardiovascular: Negative.  Negative for chest pain and palpitations.  Gastrointestinal:  Negative for abdominal pain, diarrhea, nausea and vomiting.  Genitourinary: Negative.  Negative for dysuria and hematuria.  Skin: Negative.  Negative for rash.  Neurological: Negative.  Negative for dizziness and headaches.  All other systems reviewed and are negative.   Vitals:   11/30/22 0910  BP: 134/72  Pulse: 100  Temp: 98.4 F (36.9 C)  SpO2: 99%    Physical  Exam Vitals reviewed.  Constitutional:      Appearance: Normal appearance.  HENT:     Head: Normocephalic.  Eyes:     Extraocular Movements: Extraocular movements intact.  Cardiovascular:     Rate and Rhythm: Normal rate.  Pulmonary:     Effort: Pulmonary effort is normal.  Abdominal:     Palpations: Abdomen is soft.     Tenderness: There is no abdominal tenderness.  Skin:    General: Skin is warm and dry.     Capillary Refill: Capillary refill takes less than 2 seconds.  Neurological:     Mental Status: He is alert and oriented to person, place, and time.  Psychiatric:        Mood and Affect: Mood normal.        Behavior: Behavior normal.      ASSESSMENT & PLAN: A total of 43 minutes was spent with the patient and counseling/coordination of care regarding preparing for this visit, review of most recent office visit notes, review of most recent blood work results, review of most recent chest x-ray and CT scan abdomen and pelvis reports, review of multiple medical conditions and their management, review of all medications, education on nutrition, prognosis, documentation and need for follow-up.  Problem List Items Addressed This Visit       Cardiovascular and Mediastinum   Essential hypertension    BP Readings from Last 3 Encounters:  11/30/22 134/72  11/11/22 128/68  06/30/22 (!) 146/77  Well-controlled hypertension Continue amlodipine 10 mg and Hyzaar 100-12.5 mg daily         Nervous and Auditory   Mild dementia without behavioral disturbance, psychotic disturbance, mood disturbance, or anxiety (HCC)    Stable.  No concerns.        Other   Weight loss - Primary    Eating better.  Gained a couple of pounds. Unremarkable blood work and urinalysis Unremarkable chest x-ray Unremarkable CT scan of abdomen and pelvis Negative workup for malignancy      Other Visit Diagnoses     History of prostate cancer       Relevant Orders   PSA   History of melanoma  Relevant Orders   Ambulatory referral to Dermatology      Patient Instructions  Health Maintenance After Age 64 After age 78, you are at a higher risk for certain long-term diseases and infections as well as injuries from falls. Falls are a major cause of broken bones and head injuries in people who are older than age 29. Getting regular preventive care can help to keep you healthy and well. Preventive care includes getting regular testing and making lifestyle changes as recommended by your health care provider. Talk with your health care provider about: Which screenings and tests you should have. A screening is a test that checks for a disease when you have no symptoms. A diet and exercise plan that is right for you. What should I know about screenings and tests to prevent falls? Screening and testing are the best ways to find a health problem early. Early diagnosis and treatment give you the best chance of managing medical conditions that are common after age 65. Certain conditions and lifestyle choices may make you more likely to have a fall. Your health care provider may recommend: Regular vision checks. Poor vision and conditions such as cataracts can make you more likely to have a fall. If you wear glasses, make sure to get your prescription updated if your vision changes. Medicine review. Work with your health care provider to regularly review all of the medicines you are taking, including over-the-counter medicines. Ask your health care provider about any side effects that may make you more likely to have a fall. Tell your health care provider if any medicines that you take make you feel dizzy or sleepy. Strength and balance checks. Your health care provider may recommend certain tests to check your strength and balance while standing, walking, or changing positions. Foot health exam. Foot pain and numbness, as well as not wearing proper footwear, can make you more likely to have a  fall. Screenings, including: Osteoporosis screening. Osteoporosis is a condition that causes the bones to get weaker and break more easily. Blood pressure screening. Blood pressure changes and medicines to control blood pressure can make you feel dizzy. Depression screening. You may be more likely to have a fall if you have a fear of falling, feel depressed, or feel unable to do activities that you used to do. Alcohol use screening. Using too much alcohol can affect your balance and may make you more likely to have a fall. Follow these instructions at home: Lifestyle Do not drink alcohol if: Your health care provider tells you not to drink. If you drink alcohol: Limit how much you have to: 0-1 drink a day for women. 0-2 drinks a day for men. Know how much alcohol is in your drink. In the [place] one drink equals one 12 oz bottle of beer (355 mL), one 5 oz glass of wine (148 mL), or one 1 oz glass of hard liquor (44 mL). Do not use any products that contain nicotine or tobacco. These products include cigarettes, chewing tobacco, and vaping devices, such as e-cigarettes. If you need help quitting, ask your health care provider. Activity  Follow a regular exercise program to stay fit. This will help you maintain your balance. Ask your health care provider what types of exercise are appropriate for you. If you need a cane or walker, use it as recommended by your health care provider. Wear supportive shoes that have nonskid soles. Safety  Remove any tripping hazards, such as rugs, cords, and clutter. Install safety  equipment such as grab bars in bathrooms and safety rails on stairs. Keep rooms and walkways well-lit. General instructions Talk with your health care provider about your risks for falling. Tell your health care provider if: You fall. Be sure to tell your health care provider about all falls, even ones that seem minor. You feel dizzy, tiredness (fatigue), or off-balance. Take  over-the-counter and prescription medicines only as told by your health care provider. These include supplements. Eat a healthy diet and maintain a healthy weight. A healthy diet includes low-fat dairy products, low-fat (lean) meats, and fiber from whole grains, beans, and lots of fruits and vegetables. Stay current with your vaccines. Schedule regular health, dental, and eye exams. Summary Having a healthy lifestyle and getting preventive care can help to protect your health and wellness after age 42. Screening and testing are the best way to find a health problem early and help you avoid having a fall. Early diagnosis and treatment give you the best chance for managing medical conditions that are more common for people who are older than age 53. Falls are a major cause of broken bones and head injuries in people who are older than age 36. Take precautions to prevent a fall at home. Work with your health care provider to learn what changes you can make to improve your health and wellness and to prevent falls. This information is not intended to replace advice given to you by your health care provider. Make sure you discuss any questions you have with your health care provider. Document Revised: 10/20/2020 Document Reviewed: 10/20/2020 Elsevier Patient Education  2024 Elsevier Inc.    Edwina Barth, MD [name]

## 2022-11-30 NOTE — Assessment & Plan Note (Signed)
BP Readings from Last 3 Encounters:  11/30/22 134/72  11/11/22 128/68  06/30/22 (!) 146/77  Well-controlled hypertension Continue amlodipine 10 mg and Hyzaar 100-12.5 mg daily

## 2022-11-30 NOTE — Patient Instructions (Signed)
Health Maintenance After Age 78 After age 78, you are at a higher risk for certain long-term diseases and infections as well as injuries from falls. Falls are a major cause of broken bones and head injuries in people who are older than age 78. Getting regular preventive care can help to keep you healthy and well. Preventive care includes getting regular testing and making lifestyle changes as recommended by your health care provider. Talk with your health care provider about: Which screenings and tests you should have. A screening is a test that checks for a disease when you have no symptoms. A diet and exercise plan that is right for you. What should I know about screenings and tests to prevent falls? Screening and testing are the best ways to find a health problem early. Early diagnosis and treatment give you the best chance of managing medical conditions that are common after age 78. Certain conditions and lifestyle choices may make you more likely to have a fall. Your health care provider may recommend: Regular vision checks. Poor vision and conditions such as cataracts can make you more likely to have a fall. If you wear glasses, make sure to get your prescription updated if your vision changes. Medicine review. Work with your health care provider to regularly review all of the medicines you are taking, including over-the-counter medicines. Ask your health care provider about any side effects that may make you more likely to have a fall. Tell your health care provider if any medicines that you take make you feel dizzy or sleepy. Strength and balance checks. Your health care provider may recommend certain tests to check your strength and balance while standing, walking, or changing positions. Foot health exam. Foot pain and numbness, as well as not wearing proper footwear, can make you more likely to have a fall. Screenings, including: Osteoporosis screening. Osteoporosis is a condition that causes  the bones to get weaker and break more easily. Blood pressure screening. Blood pressure changes and medicines to control blood pressure can make you feel dizzy. Depression screening. You may be more likely to have a fall if you have a fear of falling, feel depressed, or feel unable to do activities that you used to do. Alcohol use screening. Using too much alcohol can affect your balance and may make you more likely to have a fall. Follow these instructions at home: Lifestyle Do not drink alcohol if: Your health care provider tells you not to drink. If you drink alcohol: Limit how much you have to: 0-1 drink a day for women. 0-2 drinks a day for men. Know how much alcohol is in your drink. In the [place] one drink equals one 12 oz bottle of beer (355 mL), one 5 oz glass of wine (148 mL), or one 1 oz glass of hard liquor (44 mL). Do not use any products that contain nicotine or tobacco. These products include cigarettes, chewing tobacco, and vaping devices, such as e-cigarettes. If you need help quitting, ask your health care provider. Activity  Follow a regular exercise program to stay fit. This will help you maintain your balance. Ask your health care provider what types of exercise are appropriate for you. If you need a cane or walker, use it as recommended by your health care provider. Wear supportive shoes that have nonskid soles. Safety  Remove any tripping hazards, such as rugs, cords, and clutter. Install safety equipment such as grab bars in bathrooms and safety rails on stairs. Keep rooms and walkways   well-lit. General instructions Talk with your health care provider about your risks for falling. Tell your health care provider if: You fall. Be sure to tell your health care provider about all falls, even ones that seem minor. You feel dizzy, tiredness (fatigue), or off-balance. Take over-the-counter and prescription medicines only as told by your health care provider. These include  supplements. Eat a healthy diet and maintain a healthy weight. A healthy diet includes low-fat dairy products, low-fat (lean) meats, and fiber from whole grains, beans, and lots of fruits and vegetables. Stay current with your vaccines. Schedule regular health, dental, and eye exams. Summary Having a healthy lifestyle and getting preventive care can help to protect your health and wellness after age 78. Screening and testing are the best way to find a health problem early and help you avoid having a fall. Early diagnosis and treatment give you the best chance for managing medical conditions that are more common for people who are older than age 78. Falls are a major cause of broken bones and head injuries in people who are older than age 78. Take precautions to prevent a fall at home. Work with your health care provider to learn what changes you can make to improve your health and wellness and to prevent falls. This information is not intended to replace advice given to you by your health care provider. Make sure you discuss any questions you have with your health care provider. Document Revised: 10/20/2020 Document Reviewed: 10/20/2020 Elsevier Patient Education  2024 Elsevier Inc.  

## 2022-12-07 ENCOUNTER — Ambulatory Visit (INDEPENDENT_AMBULATORY_CARE_PROVIDER_SITE_OTHER): Payer: Medicare Other

## 2022-12-07 VITALS — Ht 70.0 in | Wt 145.0 lb

## 2022-12-07 DIAGNOSIS — Z0000 Encounter for general adult medical examination without abnormal findings: Secondary | ICD-10-CM

## 2022-12-07 NOTE — Patient Instructions (Addendum)
[name] , Thank you for taking time to come for your Medicare Wellness Visit. I appreciate your ongoing commitment to your health goals. Please review the following plan we discussed and let me know if I can assist you in the future.   These are the goals we discussed:  Goals       Gain Weight (pt-stated)      Patient Stated      Watch less TV Eat more health.        This is a list of the screening recommended for you and due dates:  Health Maintenance  Topic Date Due   COVID-19 Vaccine (3 - Pfizer risk series) 12/23/2022*   Zoster (Shingles) Vaccine (1 of 2) 02/11/2023*   Pneumonia Vaccine (2 of 2 - PPSV23 or PCV20) 11/30/2023*   Flu Shot  01/13/2023   Medicare Annual Wellness Visit  12/07/2023   Hepatitis C Screening  Completed   HPV Vaccine  Aged Out   DTaP/Tdap/Td vaccine  Discontinued   Colon Cancer Screening  Discontinued  *Topic was postponed. The date shown is not the original due date.    Advanced directives: Please bring a copy of your health care power of attorney and living will to the office to be added to your chart at your convenience.   Conditions/risks identified: None  Next appointment: Follow up in one year for your annual wellness visit.   Preventive Care 25 Years and Older, Male  Preventive care refers to lifestyle choices and visits with your health care provider that can promote health and wellness. What does preventive care include? A yearly physical exam. This is also called an annual well check. Dental exams once or twice a year. Routine eye exams. Ask your health care provider how often you should have your eyes checked. Personal lifestyle choices, including: Daily care of your teeth and gums. Regular physical activity. Eating a healthy diet. Avoiding tobacco and drug use. Limiting alcohol use. Practicing safe sex. Taking low doses of aspirin every day. Taking vitamin and mineral supplements as recommended by your health care  provider. What happens during an annual well check? The services and screenings done by your health care provider during your annual well check will depend on your age, overall health, lifestyle risk factors, and family history of disease. Counseling  Your health care provider may ask you questions about your: Alcohol use. Tobacco use. Drug use. Emotional well-being. Home and relationship well-being. Sexual activity. Eating habits. History of falls. Memory and ability to understand (cognition). Work and work Astronomer. Screening  You may have the following tests or measurements: Height, weight, and BMI. Blood pressure. Lipid and cholesterol levels. These may be checked every 5 years, or more frequently if you are over 54 years old. Skin check. Lung cancer screening. You may have this screening every year starting at age 40 if you have a 30-pack-year history of smoking and currently smoke or have quit within the past 15 years. Fecal occult blood test (FOBT) of the stool. You may have this test every year starting at age 61. Flexible sigmoidoscopy or colonoscopy. You may have a sigmoidoscopy every 5 years or a colonoscopy every 10 years starting at age 81. Prostate cancer screening. Recommendations will vary depending on your family history and other risks. Hepatitis C blood test. Hepatitis B blood test. Sexually transmitted disease (STD) testing. Diabetes screening. This is done by checking your blood sugar (glucose) after you have not eaten for a while (fasting). You may have  this done every 1-3 years. Abdominal aortic aneurysm (AAA) screening. You may need this if you are a current or former smoker. Osteoporosis. You may be screened starting at age 55 if you are at high risk. Talk with your health care provider about your test results, treatment options, and if necessary, the need for more tests. Vaccines  Your health care provider may recommend certain vaccines, such  as: Influenza vaccine. This is recommended every year. Tetanus, diphtheria, and acellular pertussis (Tdap, Td) vaccine. You may need a Td booster every 10 years. Zoster vaccine. You may need this after age 16. Pneumococcal 13-valent conjugate (PCV13) vaccine. One dose is recommended after age 66. Pneumococcal polysaccharide (PPSV23) vaccine. One dose is recommended after age 6. Talk to your health care provider about which screenings and vaccines you need and how often you need them. This information is not intended to replace advice given to you by your health care provider. Make sure you discuss any questions you have with your health care provider. Document Released: 06/27/2015 Document Revised: 02/18/2016 Document Reviewed: 04/01/2015 Elsevier Interactive Patient Education  2017 [employer] Prevention in the Home Falls can cause injuries. They can happen to people of all ages. There are many things you can do to make your home safe and to help prevent falls. What can I do on the outside of my home? Regularly fix the edges of walkways and driveways and fix any cracks. Remove anything that might make you trip as you walk through a door, such as a raised step or threshold. Trim any bushes or trees on the path to your home. Use bright outdoor lighting. Clear any walking paths of anything that might make someone trip, such as rocks or tools. Regularly check to see if handrails are loose or broken. Make sure that both sides of any steps have handrails. Any raised decks and porches should have guardrails on the edges. Have any leaves, snow, or ice cleared regularly. Use sand or salt on walking paths during winter. Clean up any spills in your garage right away. This includes oil or grease spills. What can I do in the bathroom? Use night lights. Install grab bars by the toilet and in the tub and shower. Do not use towel bars as grab bars. Use non-skid mats or decals in the tub or  shower. If you need to sit down in the shower, use a plastic, non-slip stool. Keep the floor dry. Clean up any water that spills on the floor as soon as it happens. Remove soap buildup in the tub or shower regularly. Attach bath mats securely with double-sided non-slip rug tape. Do not have throw rugs and other things on the floor that can make you trip. What can I do in the bedroom? Use night lights. Make sure that you have a light by your bed that is easy to reach. Do not use any sheets or blankets that are too big for your bed. They should not hang down onto the floor. Have a firm chair that has side arms. You can use this for support while you get dressed. Do not have throw rugs and other things on the floor that can make you trip. What can I do in the kitchen? Clean up any spills right away. Avoid walking on wet floors. Keep items that you use a lot in easy-to-reach places. If you need to reach something above you, use a strong step stool that has a grab bar. Keep electrical cords out  of the way. Do not use floor polish or wax that makes floors slippery. If you must use wax, use non-skid floor wax. Do not have throw rugs and other things on the floor that can make you trip. What can I do with my stairs? Do not leave any items on the stairs. Make sure that there are handrails on both sides of the stairs and use them. Fix handrails that are broken or loose. Make sure that handrails are as long as the stairways. Check any carpeting to make sure that it is firmly attached to the stairs. Fix any carpet that is loose or worn. Avoid having throw rugs at the top or bottom of the stairs. If you do have throw rugs, attach them to the floor with carpet tape. Make sure that you have a light switch at the top of the stairs and the bottom of the stairs. If you do not have them, ask someone to add them for you. What else can I do to help prevent falls? Wear shoes that: Do not have high heels. Have  rubber bottoms. Are comfortable and fit you well. Are closed at the toe. Do not wear sandals. If you use a stepladder: Make sure that it is fully opened. Do not climb a closed stepladder. Make sure that both sides of the stepladder are locked into place. Ask someone to hold it for you, if possible. Clearly mark and make sure that you can see: Any grab bars or handrails. First and last steps. Where the edge of each step is. Use tools that help you move around (mobility aids) if they are needed. These include: Canes. Walkers. Scooters. Crutches. Turn on the lights when you go into a dark area. Replace any light bulbs as soon as they burn out. Set up your furniture so you have a clear path. Avoid moving your furniture around. If any of your floors are uneven, fix them. If there are any pets around you, be aware of where they are. Review your medicines with your doctor. Some medicines can make you feel dizzy. This can increase your chance of falling. Ask your doctor what other things that you can do to help prevent falls. This information is not intended to replace advice given to you by your health care provider. Make sure you discuss any questions you have with your health care provider. Document Released: 03/27/2009 Document Revised: 11/06/2015 Document Reviewed: 07/05/2014 Elsevier Interactive Patient Education  2017 [employer].

## 2022-12-07 NOTE — Progress Notes (Signed)
Subjective:   Martin Edwards is a 77 y.o. male who presents for Medicare Annual/Subsequent preventive examination.  Visit Complete: Virtual  I connected with  ARI BERNABEI on 12/07/22 by a audio enabled telemedicine application and verified that I am speaking with the correct person using two identifiers.  Patient Location: Home  Provider Location: Home Office  I discussed the limitations of evaluation and management by telemedicine. The patient expressed understanding and agreed to proceed.  Patient Medicare AWV questionnaire was completed by the patient on ; I have confirmed that all information answered by patient is correct and no changes since this date.  Review of Systems     Cardiac Risk Factors include: advanced age (>45men, >41 women);male gender;hypertension     Objective:    Today's Vitals   12/07/22 1413  Weight: 145 lb (65.8 kg)  Height: 5\' 10"  (1.778 m)   Body mass index is 20.81 kg/m.     12/07/2022    2:23 PM 09/25/2021    9:25 AM 04/12/2019    9:36 AM 04/21/2018   10:34 AM 08/29/2017    9:47 AM 08/24/2017    2:40 PM 04/07/2016    2:35 PM  Advanced Directives  Does Patient Have a Medical Advance Directive? Yes Yes No No Yes Yes Yes  Type of Estate agent of [place];Living will Healthcare Power of [place];Living will   Healthcare Power of [place];Living will  Living will;Healthcare Power of Attorney  Does patient want to make changes to medical advance directive?     No - Patient declined  No - Patient declined  Copy of Healthcare Power of Attorney in Chart? No - copy requested No - copy requested   No - copy requested  No - copy requested  Would patient like information on creating a medical advance directive?   Yes (Inpatient - patient defers creating a medical advance directive at this time - Information given) No - Patient declined       Current Medications (verified) Outpatient Encounter Medications as of 12/07/2022   Medication Sig   amLODipine (NORVASC) 10 MG tablet TAKE 1 TABLET BY MOUTH DAILY   atorvastatin (LIPITOR) 40 MG tablet TAKE 1 TABLET BY MOUTH DAILY   Cyanocobalamin (B-12 PO) Take by mouth.   famotidine (PEPCID) 20 MG tablet TAKE 1 TABLET BY MOUTH TWICE  DAILY AS NEEDED FOR HEARTBURN OR INDIGESTION   losartan-hydrochlorothiazide (HYZAAR) 100-12.5 MG tablet TAKE 1 TABLET BY MOUTH DAILY   pantoprazole (PROTONIX) 40 MG tablet TAKE 1 TABLET BY MOUTH DAILY   No facility-administered encounter medications on file as of 12/07/2022.    Allergies (verified) Other   History: Past Medical History:  Diagnosis Date   Basal cell carcinoma of face X 2   Brow ptosis    GERD (gastroesophageal reflux disease)    History of kidney stones    Hyperlipidemia    Hypertension    Prostate cancer (HCC) 1999   Seizures (HCC) 11/2014   brain trauma -hematoma   Small bowel obstruction due to adhesions (HCC) 01/30/2014   Subdural hematoma (HCC)    Vitamin B12 deficiency 12/02/2020   Past Surgical History:  Procedure Laterality Date   APPLICATION OF A-CELL OF HEAD/NECK N/A 08/29/2017   Procedure: APPLICATION OF A-CELL;  Surgeon: Peggye Form, DO;  Location: [facility] OR;  Service: Plastics;  Laterality: N/A;   BASAL CELL CARCINOMA EXCISION  X 2   "off my face both times"   BRAIN SURGERY  COLECTOMY  10/2010   "took out 8 inches of colon; wasn't cancer"   CRANIOTOMY Right 11/11/2014   Procedure: CRANIOTOMY HEMATOMA EVACUATION SUBDURAL;  Surgeon: Hilda Lias, MD;  Location: [facility];  Service: Neurosurgery;  Laterality: Right;   CRANIOTOMY Right 11/13/2014   Procedure: Removal bone flap, insertion into the abdominal wall;  Surgeon: Hilda Lias, MD;  Location: [facility];  Service: Neurosurgery;  Laterality: Right;   CRANIOTOMY N/A 03/07/2015   Procedure: Cranioplasty using Bone flap in abdomen;  Surgeon: Hilda Lias, MD;  Location: [facility];  Service: Neurosurgery;  Laterality: N/A;   Cranioplasty using Bone flap in abdomen   CRANIOTOMY N/A 08/29/2017   Procedure: CRANIECTOMY AND DEBRIDEMENT;  Surgeon: Shirlean Kelly, MD;  Location: [facility];  Service: Neurosurgery;  Laterality: N/A;  CRANIECTOMY AND DEBRIDEMENT   EXCISION MASS NECK Right 04/14/2016   Procedure: EXCISION BIOPSY OF RIGHT NECK LYMPH NODE;  Surgeon: Osborn Coho, MD;  Location: [facility] OR;  Service: ENT;  Laterality: Right;   EXCISION MELANOMA WITH SENTINEL LYMPH NODE BIOPSY Right 04/14/2016   Procedure: WIDE EXCISION MELANOMA RIGHT SHOULDER WITH SENTINEL  NODE MAPPING;  Surgeon: Chevis Pretty III, MD;  Location: [facility] OR;  Service: General;  Laterality: Right;   PROSTATECTOMY  05/20/1998   SCALP LACERATION REPAIR N/A 08/29/2017   Procedure: SCALP FLAP TO COVER DEFECT;  Surgeon: Peggye Form, DO;  Location: [facility] OR;  Service: Plastics;  Laterality: N/A;   Family History  Problem Relation Age of Onset   Cancer Father        Lung   Alzheimer's disease Sister    Dementia Neg Hx    Social History   Socioeconomic History   Marital status: Divorced    Spouse name: Not on file   Number of children: 2   Years of education: 12   Highest education level: Not on file  Occupational History   Occupation: retired  Tobacco Use   Smoking status: Former   Smokeless tobacco: Never  Building services engineer Use: Never used  Substance and Sexual Activity   Alcohol use: Yes    Alcohol/week: 0.0 standard drinks of alcohol    Comment: 01/30/2014 "maybe a beer twice/yr"   Drug use: No   Sexual activity: Not Currently  Other Topics Concern   Not on file  Social History Narrative   Exercise: 2-3 times a wk   Lives alone   Patient drinks 2 cups of caffeine daily.   Patient is right handed.   Social Determinants of Health   Financial Resource Strain: Low Risk  (12/07/2022)   Overall Financial Resource Strain (CARDIA)    Difficulty of Paying Living Expenses: Not hard at all  Food Insecurity: No Food Insecurity (12/07/2022)    Hunger Vital Sign    Worried About Running Out of Food in the Last Year: Never true    Ran Out of Food in the Last Year: Never true  Transportation Needs: No Transportation Needs (12/07/2022)   PRAPARE - Administrator, Civil Service (Medical): No    Lack of Transportation (Non-Medical): No  Physical Activity: Sufficiently Active (12/07/2022)   Exercise Vital Sign    Days of Exercise per Week: 7 days    Minutes of Exercise per Session: 30 min  Stress: No Stress Concern Present (12/07/2022)   [employer] of Occupational Health - Occupational Stress Questionnaire    Feeling of Stress : Not at all  Social Connections: Socially Isolated (12/07/2022)  Social Advertising account executive [NHANES]    Frequency of Communication with Friends and Family: More than three times a week    Frequency of Social Gatherings with Friends and Family: More than three times a week    Attends Religious Services: Never    Database administrator or Organizations: No    Attends Banker Meetings: Never    Marital Status: Divorced    Tobacco Counseling Counseling given: Not Answered   Clinical Intake:  Pre-visit preparation completed: No  Pain : No/denies painActivities of Daily Living    12/07/2022    2:21 PM  In your present state of health, do you have any difficulty performing the following activities:  Hearing? 0  Vision? 0  Difficulty concentrating or making decisions? 0  Walking or climbing stairs? 0  Dressing or bathing? 0  Doing errands, shopping? 0  Preparing Food and eating ? N  Using the Toilet? N  In the past six months, have you accidently leaked urine? N  Do you have problems with loss of bowel control? N  Managing your Medications? N  Managing your Finances? N  Housekeeping or managing your Housekeeping? N    Patient Care Team: Georgina Quint, MD as PCP - General (Internal Medicine) Shirlean Kelly, MD as Consulting Physician  (Neurosurgery)  Indicate any recent Medical Services you may have received from other than [facility] providers in the past year (date may be approximate).     Assessment:   This is a routine wellness examination for [place].  Hearing/Vision screen Hearing Screening - Comments:: Denies hearing difficulties   Vision Screening - Comments:: Wears rx glasses - up to date with routine eye exams with  Deferred  Dietary issues and exercise activities discussed:     Goals Addressed               This Visit's Progress     Gain Weight (pt-stated)         Depression Screen    12/07/2022    2:20 PM 11/30/2022    9:08 AM 11/11/2022    9:55 AM 05/13/2022    9:28 AM 11/10/2021    1:06 PM 09/25/2021    9:26 AM 09/25/2021    9:24 AM  PHQ 2/9 Scores  PHQ - 2 Score 0 0 0 0 0 0 0    Fall Risk    12/07/2022    2:21 PM 11/30/2022    9:08 AM 11/11/2022    9:55 AM 05/13/2022    9:27 AM 11/10/2021    1:06 PM  Fall Risk   Falls in the past year? 0 0 0 0 0  Number falls in past yr: 0 0 0 0   Injury with Fall? 0 0 0 0   Risk for fall due to : No Fall Risks No Fall Risks No Fall Risks No Fall Risks   Follow up Falls prevention discussed Falls evaluation completed Falls evaluation completed Falls evaluation completed     MEDICARE RISK AT HOME:  Medicare Risk at Home - 12/07/22 1427     Any stairs in or around the home? No    If so, are there any without handrails? No    Home free of loose throw rugs in walkways, pet beds, electrical cords, etc? Yes    Adequate lighting in your home to reduce risk of falls? Yes    Life alert? No    Use of a cane, walker or w/c? No  Grab bars in the bathroom? Yes    Shower chair or bench in shower? No    Elevated toilet seat or a handicapped toilet? No             TIMED UP AND GO:  Was the test performed?  No    Cognitive Function:    06/30/2022   10:30 AM 12/07/2021    8:16 AM 06/04/2021    1:15 PM 12/02/2020    4:22 PM  MMSE - Mini Mental State  Exam  Orientation to time 4 3 5 4   Orientation to Place 4 4 4 5   Registration 3 3 3 3   Attention/ Calculation 0 1 3 4   Recall 3 3 2 2   Language- name 2 objects 0 2 2 1   Language- repeat 0 1 1 0  Language- follow 3 step command 3 3 2 2   Language- read & follow direction 1 1 1  0  Write a sentence 1 0 1 1  Copy design 0 0 0 0  Total score 19 21 24 22         12/07/2022    2:23 PM 05/19/2020    1:02 PM 04/12/2019    9:34 AM  6CIT Screen  What Year? 0 points 0 points 0 points  What month? 0 points 0 points 0 points  What time? 0 points 0 points 0 points  Count back from 20 0 points 0 points 0 points  Months in reverse 0 points 0 points 0 points  Repeat phrase 6 points 2 points 4 points  Total Score 6 points 2 points 4 points    Immunizations Immunization History  Administered Date(s) Administered   PFIZER(Purple Top)SARS-COV-2 Vaccination 07/24/2019, 08/14/2019   Pneumococcal Conjugate-13 06/18/2014   Pneumococcal-Unspecified 06/14/2010   Tdap 06/27/2009     Covid-19 vaccine status: Completed vaccines  Qualifies for Shingles Vaccine? Yes   Zostavax completed No   Shingrix Completed?: No.    Education has been provided regarding the importance of this vaccine. Patient has been advised to call insurance company to determine out of pocket expense if they have not yet received this vaccine. Advised may also receive vaccine at local pharmacy or Health Dept. Verbalized acceptance and understanding.  Screening Tests Health Maintenance  Topic Date Due   COVID-19 Vaccine (3 - Pfizer risk series) 12/23/2022 (Originally 09/11/2019)   Zoster Vaccines- Shingrix (1 of 2) 02/11/2023 (Originally 06/03/1965)   Pneumonia Vaccine 20+ Years old (2 of 2 - PPSV23 or PCV20) 11/30/2023 (Originally 06/19/2015)   INFLUENZA VACCINE  01/13/2023   Medicare Annual Wellness (AWV)  12/07/2023   Hepatitis C Screening  Completed   HPV VACCINES  Aged Out   DTaP/Tdap/Td  Discontinued   Colonoscopy   Discontinued    Health Maintenance  There are no preventive care reminders to display for this patient.   Colorectal cancer screening: No longer required.   Lung Cancer Screening: (Low Dose CT Chest recommended if Age 48-80 years, 20 pack-year currently smoking OR have quit w/in 15years.) does not qualify.     Additional Screening:  Hepatitis C Screening: does qualify; Completed 01/08/16  Vision Screening: Recommended annual ophthalmology exams for early detection of glaucoma and other disorders of the eye. Is the patient up to date with their annual eye exam?  Yes  Who is the provider or what is the name of the office in which the patient attends annual eye exams? Deferred If pt is not established with a provider, would they like to be  referred to a provider to establish care? No .   Dental Screening: Recommended annual dental exams for proper oral hygiene    Community Resource Referral / Chronic Care Management  CRR required this visit?  No   CCM required this visit?  No     Plan:     I have personally reviewed and noted the following in the patient's chart:   Medical and social history Use of alcohol, tobacco or illicit drugs  Current medications and supplements including opioid prescriptions. Patient is not currently taking opioid prescriptions. Functional ability and status Nutritional status Physical activity Advanced directives List of other physicians Hospitalizations, surgeries, and ER visits in previous 12 months Vitals Screenings to include cognitive, depression, and falls Referrals and appointments  In addition, I have reviewed and discussed with patient certain preventive protocols, quality metrics, and best practice recommendations. A written personalized care plan for preventive services as well as general preventive health recommendations were provided to patient.     Tillie Rung, LPN   09/20/8117   After Visit Summary: (MyChart) Due to  this being a telephonic visit, the after visit summary with patients personalized plan was offered to patient via MyChart   Nurse Notes:   None

## 2023-01-11 ENCOUNTER — Ambulatory Visit: Payer: Medicare Other | Admitting: Adult Health

## 2023-01-11 ENCOUNTER — Encounter: Payer: Self-pay | Admitting: Adult Health

## 2023-01-11 VITALS — BP 150/83 | HR 100 | Ht 70.0 in | Wt 144.0 lb

## 2023-01-11 DIAGNOSIS — G3109 Other frontotemporal dementia: Secondary | ICD-10-CM | POA: Diagnosis not present

## 2023-01-11 DIAGNOSIS — F0280 Dementia in other diseases classified elsewhere without behavioral disturbance: Secondary | ICD-10-CM | POA: Diagnosis not present

## 2023-01-11 NOTE — Progress Notes (Signed)
PATIENT: Martin Edwards DOB: 03/05/1946  REASON FOR VISIT: follow up HISTORY FROM: patient  Chief Complaint  Patient presents with   Rm 5    Patient is here with his son for 6 month follow-up. He states he has been doing well. He states he is sleeping well and doesn't have any problem. His son states he lost some weight for awhile. His family doctor had him take a supplement and that has helped some.      HISTORY OF PRESENT ILLNESS: Today 01/11/23:  Martin Edwards is a 77 y.o. male with a history of Frontotemporal Dementia and seizure like activity. Returns today for follow-up. Here with his son. Feels that he is stable. Lives at home alone. Able to complete all ADLs independently. He continues to manages her own medications, appts and finances. No change in Mood or behavior. No trouble sleeping. PCP has him on Ensure to help with weight loss.   To note [name] diagnosed the patient with possible frontotemporal dementia after MRI report.  Patient has not reported any changes in his behavior.     117/24: Edwards Martin is a 77 y.o. male with a history of Frontotemporal Dementia and seizure like activity. Returns today for follow-up. Here with his son. Son reports memory is stable. Able to complete all ADLS independently. He manages he own mediations, appts and finances. Lives alone. No changes in mood or behavior. Sleeps well. Denies any seizure like activity.   12/07/21: [name] is a 77 year old male with a history of frontotemporal dementia and seizure-like activity.  He returns today for follow-up.  No behavioral issues per son. No significant changes with his memory. Lives at home alone. Able to complete all ADLS independently. Manages all medications, appts and finances independently. Son is available to help when needed. Denies any seizure like activity.  Son reports that he has had a low B12 level.  Was unable to tolerate oral B12.  Reports that PCP mention sublingual B12  but he has not started this yet.  Reports there is been no mention of B12 injections.  Advised that he should continue following up with his PCP for B12 levels  06/04/21: Martin Edwards is a 77 year old male with a history of traumatic brain injury, memory disturbance and seizure events.  He returns today for follow-up.  The patient has not had any seizure-like episodes.  Not currently on any medication.  [name] felt that after his MRI he most likely had frontotemporal dementia.  He is not on any medication at this time.  Patient notes that his behavior has remained stable.  His son states that there was been only 1 occasion that he switch his insurance abruptly.  He lives at home alone.  He is able to complete all ADLs independently.  He does operate a motor vehicle without difficulty.  Does report that he only drives to familiar places.  Returns today for an evaluation.  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  The patient was seen previously through this office for the seizures and he was eventually able to come off of his seizure medications and is doing well.  In 2019, he had an infection of the bone flap and had to have this removed, the patient claims that since then he has had some increased problems with memory.  He has had troubles  with memory since the original head trauma that occurred on 11 Nov 2014.  He particularly noted problems with word finding, but this issue has gradually worsened over time.  He has had short-term memory issues, but he has not altered any activities of daily living.  He still operates a motor vehicle without difficulty, he is keeping up with his own medications and appointments.  He does his finances.  He currently lives alone.  He claims that he sleeps fairly well and has a good energy level.  He denies any numbness or weakness of extremities, he  denies headaches or dizziness.  His primary care physician checked blood work recently that showed evidence of a low vitamin B12 level, he has not yet gone on any supplementation.  He denies any depression problems.  He comes to this office for an evaluation.  REVIEW OF SYSTEMS: Out of a complete 14 system review of symptoms, the patient complains only of the following symptoms, and all other reviewed systems are negative.  ALLERGIES: Allergies  Allergen Reactions   Other Rash    Needs Hypoallergenic Sheets/ Laundry Cart had questionable allergic reaction to detergent last time he was here.    HOME MEDICATIONS: Outpatient Medications Prior to Visit  Medication Sig Dispense Refill   amLODipine (NORVASC) 10 MG tablet TAKE 1 TABLET BY MOUTH DAILY 90 tablet 3   atorvastatin (LIPITOR) 40 MG tablet TAKE 1 TABLET BY MOUTH DAILY 90 tablet 3   Cyanocobalamin (B-12 PO) Take by mouth.     famotidine (PEPCID) 20 MG tablet TAKE 1 TABLET BY MOUTH TWICE  DAILY AS NEEDED FOR HEARTBURN OR INDIGESTION 180 tablet 3   losartan-hydrochlorothiazide (HYZAAR) 100-12.5 MG tablet TAKE 1 TABLET BY MOUTH DAILY 90 tablet 3   pantoprazole (PROTONIX) 40 MG tablet TAKE 1 TABLET BY MOUTH DAILY 90 tablet 3   No facility-administered medications prior to visit.    PAST MEDICAL HISTORY: Past Medical History:  Diagnosis Date   Basal cell carcinoma of face X 2   Brow ptosis    GERD (gastroesophageal reflux disease)    History of kidney stones    Hyperlipidemia    Hypertension    Prostate cancer (HCC) 1999   Seizures (HCC) 11/2014   brain trauma -hematoma   Small bowel obstruction due to adhesions (HCC) 01/30/2014   Subdural hematoma (HCC)    Vitamin B12 deficiency 12/02/2020    PAST SURGICAL HISTORY: Past Surgical History:  Procedure Laterality Date   APPLICATION OF A-CELL OF HEAD/NECK N/A 08/29/2017   Procedure: APPLICATION OF A-CELL;  Surgeon: Peggye Form, DO;  Location: [facility] OR;  Service: Plastics;   Laterality: N/A;   BASAL CELL CARCINOMA EXCISION  X 2   "off my face both times"   BRAIN SURGERY     COLECTOMY  10/2010   "took out 8 inches of colon; wasn't cancer"   CRANIOTOMY Right 11/11/2014   Procedure: CRANIOTOMY HEMATOMA EVACUATION SUBDURAL;  Surgeon: Hilda Lias, MD;  Location: [facility];  Service: Neurosurgery;  Laterality: Right;   CRANIOTOMY Right 11/13/2014   Procedure: Removal bone flap, insertion into the abdominal wall;  Surgeon: Hilda Lias, MD;  Location: [facility];  Service: Neurosurgery;  Laterality: Right;   CRANIOTOMY N/A 03/07/2015   Procedure: Cranioplasty using Bone flap in abdomen;  Surgeon: Hilda Lias, MD;  Location: [facility];  Service: Neurosurgery;  Laterality: N/A;  Cranioplasty using Bone flap in abdomen   CRANIOTOMY N/A 08/29/2017   Procedure: CRANIECTOMY AND DEBRIDEMENT;  Surgeon: Shirlean Kelly, MD;  Location: [facility] OR;  Service: Neurosurgery;  Laterality: N/A;  CRANIECTOMY AND DEBRIDEMENT   EXCISION MASS NECK Right 04/14/2016   Procedure: EXCISION BIOPSY OF RIGHT NECK LYMPH NODE;  Surgeon: Osborn Coho, MD;  Location: [facility] OR;  Service: ENT;  Laterality: Right;   EXCISION MELANOMA WITH SENTINEL LYMPH NODE BIOPSY Right 04/14/2016   Procedure: WIDE EXCISION MELANOMA RIGHT SHOULDER WITH SENTINEL  NODE MAPPING;  Surgeon: Chevis Pretty III, MD;  Location: [facility] OR;  Service: General;  Laterality: Right;   PROSTATECTOMY  05/20/1998   SCALP LACERATION REPAIR N/A 08/29/2017   Procedure: SCALP FLAP TO COVER DEFECT;  Surgeon: Peggye Form, DO;  Location: [facility] OR;  Service: Plastics;  Laterality: N/A;    FAMILY HISTORY: Family History  Problem Relation Age of Onset   Cancer Father        Lung   Alzheimer's disease Sister    Dementia Neg Hx     SOCIAL HISTORY: Social History   Socioeconomic History   Marital status: Divorced    Spouse name: Not on file   Number of children: 2   Years of education: 12   Highest education level: Not on file   Occupational History   Occupation: retired  Tobacco Use   Smoking status: Former   Smokeless tobacco: Never  Vaping Use   Vaping status: Never Used  Substance and Sexual Activity   Alcohol use: Not Currently    Comment: 01/30/2014 "maybe a beer twice/yr"   Drug use: No   Sexual activity: Not Currently  Other Topics Concern   Not on file  Social History Narrative   Exercise: daily if he can, does walking    Lives alone   Patient drinks 2 cups of caffeine daily.   Patient is right handed.   Social Determinants of Health   Financial Resource Strain: Low Risk  (12/07/2022)   Overall Financial Resource Strain (CARDIA)    Difficulty of Paying Living Expenses: Not hard at all  Food Insecurity: No Food Insecurity (12/07/2022)   Hunger Vital Sign    Worried About Running Out of Food in the Last Year: Never true    Ran Out of Food in the Last Year: Never true  Transportation Needs: No Transportation Needs (12/07/2022)   PRAPARE - Administrator, Civil Service (Medical): No    Lack of Transportation (Non-Medical): No  Physical Activity: Sufficiently Active (12/07/2022)   Exercise Vital Sign    Days of Exercise per Week: 7 days    Minutes of Exercise per Session: 30 min  Stress: No Stress Concern Present (12/07/2022)   [employer] of Occupational Health - Occupational Stress Questionnaire    Feeling of Stress : Not at all  Social Connections: Socially Isolated (12/07/2022)   Social Connection and Isolation Panel [NHANES]    Frequency of Communication with Friends and Family: More than three times a week    Frequency of Social Gatherings with Friends and Family: More than three times a week    Attends Religious Services: Never    Database administrator or Organizations: No    Attends Banker Meetings: Never    Marital Status: Divorced  Catering manager Violence: Not At Risk (12/07/2022)   Humiliation, Afraid, Rape, and Kick questionnaire    Fear of Current  or Ex-Partner: No    Emotionally Abused: No    Physically Abused: No    Sexually Abused: No      PHYSICAL  EXAM  Vitals:   01/11/23 1053  BP: (!) 150/83  Pulse: 100  Weight: 144 lb (65.3 kg)  Height: 5\' 10"  (1.778 m)   Body mass index is 20.66 kg/m.     06/30/2022   10:30 AM 12/07/2021    8:16 AM 06/04/2021    1:15 PM  MMSE - Mini Mental State Exam  Orientation to time 4 3 5   Orientation to Place 4 4 4   Registration 3 3 3   Attention/ Calculation 0 1 3  Recall 3 3 2   Language- name 2 objects 0 2 2  Language- repeat 0 1 1  Language- follow 3 step command 3 3 2   Language- read & follow direction 1 1 1   Write a sentence 1 0 1  Copy design 0 0 0  Total score 19 21 24      Generalized: Well developed, in no acute distress   Neurological examination  Mentation: Alert oriented to time, place, history taking. Follows all commands speech and language fluent Cranial nerve II-XII: Pupils were equal round reactive to light. Extraocular movements were full, visual field were full on confrontational test. Facial sensation and strength were normal. Head turning and shoulder shrug  were normal and symmetric. Motor: The motor testing reveals 5 over 5 strength of all 4 extremities. Good symmetric motor tone is noted throughout.  Sensory: Sensory testing is intact to soft touch on all 4 extremities. No evidence of extinction is noted.  Coordination: Cerebellar testing reveals good finger-nose-finger and heel-to-shin bilaterally.  Gait and station: Gait is normal.  Reflexes: Deep tendon reflexes are symmetric and normal bilaterally.   DIAGNOSTIC DATA (LABS, IMAGING, TESTING) - I reviewed patient records, labs, notes, testing and imaging myself where available.  Lab Results  Component Value Date   WBC 5.6 11/11/2022   HGB 15.5 11/11/2022   HCT 47.2 11/11/2022   MCV 92.9 11/11/2022   PLT 188.0 11/11/2022      Component Value Date/Time   NA 136 11/11/2022 1045   NA 137  04/30/2020 1107   K 4.3 11/11/2022 1045   CL 97 11/11/2022 1045   CO2 28 11/11/2022 1045   GLUCOSE 130 (H) 11/11/2022 1045   BUN 8 11/11/2022 1045   BUN 10 04/30/2020 1107   CREATININE 1.06 11/11/2022 1045   CREATININE 0.97 01/08/2016 1010   CALCIUM 9.7 11/11/2022 1045   PROT 8.4 (H) 11/11/2022 1045   PROT 8.0 04/30/2020 1107   ALBUMIN 4.8 11/11/2022 1045   ALBUMIN 5.0 (H) 04/30/2020 1107   AST 21 11/11/2022 1045   ALT 22 11/11/2022 1045   ALKPHOS 73 11/11/2022 1045   BILITOT 1.3 (H) 11/11/2022 1045   BILITOT 0.9 04/30/2020 1107   GFRNONAA 69 04/30/2020 1107   GFRNONAA 79 01/08/2016 1010   GFRAA 80 04/30/2020 1107   GFRAA >89 01/08/2016 1010   Lab Results  Component Value Date   CHOL 128 11/11/2022   HDL 60.70 11/11/2022   LDLCALC 55 11/11/2022   TRIG 63.0 11/11/2022   CHOLHDL 2 11/11/2022   Lab Results  Component Value Date   HGBA1C 6.1 11/11/2022   Lab Results  Component Value Date   VITAMINB12 614 11/11/2022   Lab Results  Component Value Date   TSH 1.56 11/11/2022      ASSESSMENT AND PLAN 77 y.o. year old male  has a past medical history of Basal cell carcinoma of face (X 2), Brow ptosis, GERD (gastroesophageal reflux disease), History of kidney stones, Hyperlipidemia, Hypertension, Prostate cancer (HCC) (  1999), Seizures (HCC) (11/2014), Small bowel obstruction due to adhesions (HCC) (01/30/2014), Subdural hematoma (HCC), and Vitamin B12 deficiency (12/02/2020). here with:  1.  Memory disturbance most likely frontotemporal dementia   -- MMSE 19/30 previously 21 out of 30 -- I did advise that if this is frontotemporal dementia medication such as Aricept and Namenda typically are not beneficial however we could do a trial on Namenda to see if it offers him any additional benefits as he is never had any behavioral changes.  For now the patient sounds that they would monitor.  If they decide they want to try this medication they will let me know --We will  follow-up in 6 months or sooner if needed   Previous patient of Dr. Clarisa Kindred.  If amendable Dr. Terrace Arabia will become the primary neurologist  Butch Penny, MSN, NP-C 01/11/2023, 11:06 AM [facility] [address], [address] [place], [place] [address] [phone]

## 2023-01-11 NOTE — Patient Instructions (Signed)
Continue to monitor memory Could do a trial on Namenda  If your symptoms worsen or you develop new symptoms please let us know.

## 2023-03-23 DIAGNOSIS — Z08 Encounter for follow-up examination after completed treatment for malignant neoplasm: Secondary | ICD-10-CM | POA: Diagnosis not present

## 2023-03-23 DIAGNOSIS — D225 Melanocytic nevi of trunk: Secondary | ICD-10-CM | POA: Diagnosis not present

## 2023-03-23 DIAGNOSIS — Z85828 Personal history of other malignant neoplasm of skin: Secondary | ICD-10-CM | POA: Diagnosis not present

## 2023-03-23 DIAGNOSIS — Z85820 Personal history of malignant melanoma of skin: Secondary | ICD-10-CM | POA: Diagnosis not present

## 2023-03-23 DIAGNOSIS — Z86007 Personal history of in-situ neoplasm of skin: Secondary | ICD-10-CM | POA: Diagnosis not present

## 2023-03-23 DIAGNOSIS — L570 Actinic keratosis: Secondary | ICD-10-CM | POA: Diagnosis not present

## 2023-03-23 DIAGNOSIS — L821 Other seborrheic keratosis: Secondary | ICD-10-CM | POA: Diagnosis not present

## 2023-03-23 DIAGNOSIS — L814 Other melanin hyperpigmentation: Secondary | ICD-10-CM | POA: Diagnosis not present

## 2023-05-31 ENCOUNTER — Other Ambulatory Visit: Payer: Self-pay | Admitting: Emergency Medicine

## 2023-05-31 DIAGNOSIS — I10 Essential (primary) hypertension: Secondary | ICD-10-CM

## 2023-06-01 ENCOUNTER — Ambulatory Visit: Payer: Medicare Other | Admitting: Emergency Medicine

## 2023-06-01 ENCOUNTER — Encounter: Payer: Self-pay | Admitting: Emergency Medicine

## 2023-06-01 ENCOUNTER — Other Ambulatory Visit: Payer: Self-pay | Admitting: Emergency Medicine

## 2023-06-01 VITALS — BP 144/72 | HR 100 | Temp 98.9°F | Ht 70.0 in | Wt 143.1 lb

## 2023-06-01 DIAGNOSIS — I10 Essential (primary) hypertension: Secondary | ICD-10-CM | POA: Diagnosis not present

## 2023-06-01 DIAGNOSIS — E785 Hyperlipidemia, unspecified: Secondary | ICD-10-CM

## 2023-06-01 DIAGNOSIS — R7303 Prediabetes: Secondary | ICD-10-CM

## 2023-06-01 DIAGNOSIS — G3109 Other frontotemporal dementia: Secondary | ICD-10-CM

## 2023-06-01 DIAGNOSIS — F0280 Dementia in other diseases classified elsewhere without behavioral disturbance: Secondary | ICD-10-CM

## 2023-06-01 DIAGNOSIS — F03A0 Unspecified dementia, mild, without behavioral disturbance, psychotic disturbance, mood disturbance, and anxiety: Secondary | ICD-10-CM

## 2023-06-01 LAB — POCT GLYCOSYLATED HEMOGLOBIN (HGB A1C): Hemoglobin A1C: 5.8 % — AB (ref 4.0–5.6)

## 2023-06-01 NOTE — Assessment & Plan Note (Signed)
Stable.  Diet and nutrition discussed. 

## 2023-06-01 NOTE — Assessment & Plan Note (Signed)
Stable.  No concerns. °

## 2023-06-01 NOTE — Progress Notes (Signed)
Martin Edwards 77 y.o.   Chief Complaint  Patient presents with   Medical Management of Chronic Issues    6 month follow up on new issue     HISTORY OF PRESENT ILLNESS: This is a 77 y.o. male here for 88-month follow-up of chronic medical conditions.  Accompanied by son. Overall doing well.  Stable.  No new concerns. Wt Readings from Last 3 Encounters:  06/01/23 143 lb 2 oz (64.9 kg)  01/11/23 144 lb (65.3 kg)  12/07/22 145 lb (65.8 kg)     HPI   Prior to Admission medications   Medication Sig Start Date End Date Taking? Authorizing Provider  amLODipine (NORVASC) 10 MG tablet TAKE 1 TABLET BY MOUTH DAILY 06/01/23  Yes Maika Kaczmarek, Eilleen Kempf, MD  atorvastatin (LIPITOR) 40 MG tablet TAKE 1 TABLET BY MOUTH DAILY 06/01/23  Yes Javeah Loeza, Eilleen Kempf, MD  Cyanocobalamin (B-12 PO) Take by mouth.   Yes [provider]  famotidine (PEPCID) 20 MG tablet TAKE 1 TABLET BY MOUTH TWICE  DAILY AS NEEDED FOR HEARTBURN OR INDIGESTION 01/11/22  Yes Gianny Killman, Eilleen Kempf, MD  losartan-hydrochlorothiazide [facility]) 100-12.5 MG tablet TAKE 1 TABLET BY MOUTH DAILY 06/01/23  Yes Keyah Blizard, Eilleen Kempf, MD  pantoprazole (PROTONIX) 40 MG tablet TAKE 1 TABLET BY MOUTH DAILY 01/11/22  Yes Georgina Quint, MD    Allergies  Allergen Reactions   Other Rash    Needs Hypoallergenic Sheets/ Laundry Cart had questionable allergic reaction to detergent last time he was here.    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   History of cranioplasty 03/07/2015   Seizures (HCC) 11/27/2014   Fine motor impairment 11/19/2014   Prostate cancer (HCC) 12/12/2012   Essential hypertension 10/21/2011   Dyslipidemia 10/21/2011   Adenomatous colon polyp 10/21/2011    Past Medical History:  Diagnosis Date   Basal cell carcinoma of face X 2   Brow ptosis    GERD (gastroesophageal reflux disease)    History of kidney stones    Hyperlipidemia    Hypertension    Prostate cancer (HCC) 1999   Seizures (HCC) 11/2014   brain trauma -hematoma   Small bowel obstruction due to adhesions (HCC) 01/30/2014   Subdural hematoma (HCC)    Vitamin B12 deficiency 12/02/2020    Past Surgical History:  Procedure Laterality Date   APPLICATION OF A-CELL OF HEAD/NECK N/A 08/29/2017   Procedure: APPLICATION OF A-CELL;  Surgeon: Peggye Form, DO;  Location: [facility] OR;  Service: Plastics;  Laterality: N/A;   BASAL CELL CARCINOMA EXCISION  X 2   "off my face both times"   BRAIN SURGERY     COLECTOMY  10/2010   "took out 8 inches of colon; wasn't cancer"   CRANIOTOMY Right 11/11/2014   Procedure: CRANIOTOMY HEMATOMA EVACUATION SUBDURAL;  Surgeon: Hilda Lias, MD;  Location: [facility] NEURO ORS;  Service: Neurosurgery;  Laterality: Right;   CRANIOTOMY Right 11/13/2014   Procedure: Removal bone flap, insertion into the abdominal wall;  Surgeon: Hilda Lias, MD;  Location: [facility]  NEURO ORS;  Service: Neurosurgery;  Laterality: Right;   CRANIOTOMY N/A 03/07/2015   Procedure: Cranioplasty using Bone flap in abdomen;  Surgeon: Hilda Lias, MD;  Location: [facility] NEURO ORS;  Service: Neurosurgery;  Laterality: N/A;  Cranioplasty using Bone flap in abdomen   CRANIOTOMY N/A 08/29/2017   Procedure: CRANIECTOMY AND DEBRIDEMENT;  Surgeon: Shirlean Kelly, MD;  Location: [facility] OR;  Service: Neurosurgery;  Laterality: N/A;  CRANIECTOMY AND DEBRIDEMENT   EXCISION MASS NECK Right 04/14/2016   Procedure: EXCISION BIOPSY OF RIGHT NECK LYMPH NODE;   Surgeon: Osborn Coho, MD;  Location: [facility] OR;  Service: ENT;  Laterality: Right;   EXCISION MELANOMA WITH SENTINEL LYMPH NODE BIOPSY Right 04/14/2016   Procedure: WIDE EXCISION MELANOMA RIGHT SHOULDER WITH SENTINEL  NODE MAPPING;  Surgeon: Chevis Pretty III, MD;  Location: [facility] OR;  Service: General;  Laterality: Right;   PROSTATECTOMY  05/20/1998   SCALP LACERATION REPAIR N/A 08/29/2017   Procedure: SCALP FLAP TO COVER DEFECT;  Surgeon: Peggye Form, DO;  Location: [facility] OR;  Service: Plastics;  Laterality: N/A;    Social History   Socioeconomic History   Marital status: Divorced    Spouse name: Not on file   Number of children: 2   Years of education: 12   Highest education level: Not on file  Occupational History   Occupation: retired  Tobacco Use   Smoking status: Former   Smokeless tobacco: Never  Vaping Use   Vaping status: Never Used  Substance and Sexual Activity   Alcohol use: Not Currently    Comment: 01/30/2014 "maybe a beer twice/yr"   Drug use: No   Sexual activity: Not Currently  Other Topics Concern   Not on file  Social History Narrative   Exercise: daily if he can, does walking    Lives alone   Patient drinks 2 cups of caffeine daily.   Patient is right handed.   Social Drivers of Corporate investment banker Strain: Low Risk  (12/07/2022)   Overall Financial Resource Strain (CARDIA)    Difficulty of Paying Living Expenses: Not hard at all  Food Insecurity: No Food Insecurity (12/07/2022)   Hunger Vital Sign    Worried About Running Out of Food in the Last Year: Never true    Ran Out of Food in the Last Year: Never true  Transportation Needs: No Transportation Needs (12/07/2022)   PRAPARE - Administrator, Civil Service (Medical): No    Lack of Transportation (Non-Medical): No  Physical Activity: Sufficiently Active (12/07/2022)   Exercise Vital Sign    Days of Exercise per Week: 7 days    Minutes of Exercise per Session: 30 min  Stress: No  Stress Concern Present (12/07/2022)   [employer] of Occupational Health - Occupational Stress Questionnaire    Feeling of Stress : Not at all  Social Connections: Socially Isolated (12/07/2022)   Social Connection and Isolation Panel [NHANES]    Frequency of Communication with Friends and Family: More than three times a week    Frequency of Social Gatherings with Friends and Family: More than three times a week    Attends Religious Services: Never    Database administrator or Organizations: No    Attends Banker Meetings: Never    Marital Status: Divorced  Catering manager Violence: Not At Risk (12/07/2022)   Humiliation, Afraid, Rape, and Kick questionnaire    Fear of Current or Ex-Partner: No    Emotionally Abused: No  Physically Abused: No    Sexually Abused: No    Family History  Problem Relation Age of Onset   Cancer Father        Lung   Alzheimer's disease Sister    Dementia Neg Hx      Review of Systems  Constitutional: Negative.  Negative for chills and fever.  HENT: Negative.  Negative for congestion and sore throat.   Respiratory: Negative.  Negative for cough and shortness of breath.   Cardiovascular: Negative.  Negative for chest pain and palpitations.  Gastrointestinal:  Negative for abdominal pain, diarrhea, nausea and vomiting.  Genitourinary: Negative.  Negative for dysuria and hematuria.  Skin: Negative.  Negative for rash.  Neurological: Negative.  Negative for dizziness and headaches.  All other systems reviewed and are negative.   Vitals:   06/01/23 0908  BP: (!) 144/72  Pulse: 100  Temp: 98.9 F (37.2 C)  SpO2: 97%    Physical Exam Vitals reviewed.  Constitutional:      Appearance: Normal appearance.  HENT:     Head: Normocephalic.     Mouth/Throat:     Mouth: Mucous membranes are moist.     Pharynx: Oropharynx is clear.  Eyes:     Extraocular Movements: Extraocular movements intact.     Conjunctiva/sclera:  Conjunctivae normal.     Pupils: Pupils are equal, round, and reactive to light.  Cardiovascular:     Rate and Rhythm: Normal rate and regular rhythm.     Pulses: Normal pulses.     Heart sounds: Normal heart sounds.  Pulmonary:     Effort: Pulmonary effort is normal.     Breath sounds: Normal breath sounds.  Abdominal:     Palpations: Abdomen is soft.     Tenderness: There is no abdominal tenderness.     Hernia: No hernia is present.  Musculoskeletal:     Cervical back: No tenderness.  Lymphadenopathy:     Cervical: No cervical adenopathy.  Skin:    General: Skin is warm and dry.     Capillary Refill: Capillary refill takes less than 2 seconds.  Neurological:     General: No focal deficit present.     Mental Status: He is alert and oriented to person, place, and time.  Psychiatric:        Mood and Affect: Mood normal.        Behavior: Behavior normal.    Results for orders placed or performed in visit on 06/01/23 (from the past 24 hours)  POCT HgB A1C     Status: Abnormal   Collection Time: 06/01/23 12:58 PM  Result Value Ref Range   Hemoglobin A1C 5.8 (A) 4.0 - 5.6 %   HbA1c POC (<> result, manual entry)     HbA1c, POC (prediabetic range)     HbA1c, POC (controlled diabetic range)       ASSESSMENT & PLAN: A total of 43 minutes was spent with the patient and counseling/coordination of care regarding preparing for this visit, review of most recent office visit notes, review of multiple chronic medical conditions and their management, review of all medications, review of most recent bloodwork results, review of health maintenance items, education on nutrition, prognosis, documentation, and need for follow up.   Problem List Items Addressed This Visit       Cardiovascular and Mediastinum   Essential hypertension   Well-controlled hypertension Continue amlodipine 10 mg and Hyzaar 100-12.5 mg daily   BP Readings from Last 3 Encounters:  06/01/23 (!) 144/72  01/11/23  (!) 150/83  11/30/22 134/72           Nervous and Auditory   Mild dementia without behavioral disturbance, psychotic disturbance, mood disturbance, or anxiety (HCC)   Stable.  No concerns.      Frontotemporal dementia (HCC)   Stable. Sees neurologist on a regular basis.         Other   Dyslipidemia - Primary   Chronic stable condition. Continue atorvastatin 40 mg daily       Relevant Orders   POCT HgB A1C   Prediabetes   Stable. Diet and nutrition discussed.       Patient Instructions  Health Maintenance After Age 62 After age 20, you are at a higher risk for certain long-term diseases and infections as well as injuries from falls. Falls are a major cause of broken bones and head injuries in people who are older than age 69. Getting regular preventive care can help to keep you healthy and well. Preventive care includes getting regular testing and making lifestyle changes as recommended by your health care provider. Talk with your health care provider about: Which screenings and tests you should have. A screening is a test that checks for a disease when you have no symptoms. A diet and exercise plan that is right for you. What should I know about screenings and tests to prevent falls? Screening and testing are the best ways to find a health problem early. Early diagnosis and treatment give you the best chance of managing medical conditions that are common after age 69. Certain conditions and lifestyle choices may make you more likely to have a fall. Your health care provider may recommend: Regular vision checks. Poor vision and conditions such as cataracts can make you more likely to have a fall. If you wear glasses, make sure to get your prescription updated if your vision changes. Medicine review. Work with your health care provider to regularly review all of the medicines you are taking, including over-the-counter medicines. Ask your health care provider about any side effects  that may make you more likely to have a fall. Tell your health care provider if any medicines that you take make you feel dizzy or sleepy. Strength and balance checks. Your health care provider may recommend certain tests to check your strength and balance while standing, walking, or changing positions. Foot health exam. Foot pain and numbness, as well as not wearing proper footwear, can make you more likely to have a fall. Screenings, including: Osteoporosis screening. Osteoporosis is a condition that causes the bones to get weaker and break more easily. Blood pressure screening. Blood pressure changes and medicines to control blood pressure can make you feel dizzy. Depression screening. You may be more likely to have a fall if you have a fear of falling, feel depressed, or feel unable to do activities that you used to do. Alcohol use screening. Using too much alcohol can affect your balance and may make you more likely to have a fall. Follow these instructions at home: Lifestyle Do not drink alcohol if: Your health care provider tells you not to drink. If you drink alcohol: Limit how much you have to: 0-1 drink a day for women. 0-2 drinks a day for men. Know how much alcohol is in your drink. In the [place] one drink equals one 12 oz bottle of beer (355 mL), one 5 oz glass of wine (148 mL), or one 1 oz glass of hard liquor (  44 mL). Do not use any products that contain nicotine or tobacco. These products include cigarettes, chewing tobacco, and vaping devices, such as e-cigarettes. If you need help quitting, ask your health care provider. Activity  Follow a regular exercise program to stay fit. This will help you maintain your balance. Ask your health care provider what types of exercise are appropriate for you. If you need a cane or walker, use it as recommended by your health care provider. Wear supportive shoes that have nonskid soles. Safety  Remove any tripping hazards, such as rugs,  cords, and clutter. Install safety equipment such as grab bars in bathrooms and safety rails on stairs. Keep rooms and walkways well-lit. General instructions Talk with your health care provider about your risks for falling. Tell your health care provider if: You fall. Be sure to tell your health care provider about all falls, even ones that seem minor. You feel dizzy, tiredness (fatigue), or off-balance. Take over-the-counter and prescription medicines only as told by your health care provider. These include supplements. Eat a healthy diet and maintain a healthy weight. A healthy diet includes low-fat dairy products, low-fat (lean) meats, and fiber from whole grains, beans, and lots of fruits and vegetables. Stay current with your vaccines. Schedule regular health, dental, and eye exams. Summary Having a healthy lifestyle and getting preventive care can help to protect your health and wellness after age 68. Screening and testing are the best way to find a health problem early and help you avoid having a fall. Early diagnosis and treatment give you the best chance for managing medical conditions that are more common for people who are older than age 76. Falls are a major cause of broken bones and head injuries in people who are older than age 51. Take precautions to prevent a fall at home. Work with your health care provider to learn what changes you can make to improve your health and wellness and to prevent falls. This information is not intended to replace advice given to you by your health care provider. Make sure you discuss any questions you have with your health care provider. Document Revised: 10/20/2020 Document Reviewed: 10/20/2020 Elsevier Patient Education  2024 Elsevier Inc.      Edwina Barth, MD [facility]

## 2023-06-01 NOTE — Assessment & Plan Note (Signed)
Stable.  Sees neurologist on a regular basis. ?

## 2023-06-01 NOTE — Assessment & Plan Note (Signed)
Chronic stable condition Continue atorvastatin 40 mg daily 

## 2023-06-01 NOTE — Patient Instructions (Signed)
Health Maintenance After Age 78 After age 78, you are at a higher risk for certain long-term diseases and infections as well as injuries from falls. Falls are a major cause of broken bones and head injuries in people who are older than age 78. Getting regular preventive care can help to keep you healthy and well. Preventive care includes getting regular testing and making lifestyle changes as recommended by your health care provider. Talk with your health care provider about: Which screenings and tests you should have. A screening is a test that checks for a disease when you have no symptoms. A diet and exercise plan that is right for you. What should I know about screenings and tests to prevent falls? Screening and testing are the best ways to find a health problem early. Early diagnosis and treatment give you the best chance of managing medical conditions that are common after age 78. Certain conditions and lifestyle choices may make you more likely to have a fall. Your health care provider may recommend: Regular vision checks. Poor vision and conditions such as cataracts can make you more likely to have a fall. If you wear glasses, make sure to get your prescription updated if your vision changes. Medicine review. Work with your health care provider to regularly review all of the medicines you are taking, including over-the-counter medicines. Ask your health care provider about any side effects that may make you more likely to have a fall. Tell your health care provider if any medicines that you take make you feel dizzy or sleepy. Strength and balance checks. Your health care provider may recommend certain tests to check your strength and balance while standing, walking, or changing positions. Foot health exam. Foot pain and numbness, as well as not wearing proper footwear, can make you more likely to have a fall. Screenings, including: Osteoporosis screening. Osteoporosis is a condition that causes  the bones to get weaker and break more easily. Blood pressure screening. Blood pressure changes and medicines to control blood pressure can make you feel dizzy. Depression screening. You may be more likely to have a fall if you have a fear of falling, feel depressed, or feel unable to do activities that you used to do. Alcohol use screening. Using too much alcohol can affect your balance and may make you more likely to have a fall. Follow these instructions at home: Lifestyle Do not drink alcohol if: Your health care provider tells you not to drink. If you drink alcohol: Limit how much you have to: 0-1 drink a day for women. 0-2 drinks a day for men. Know how much alcohol is in your drink. In the [place] one drink equals one 12 oz bottle of beer (355 mL), one 5 oz glass of wine (148 mL), or one 1 oz glass of hard liquor (44 mL). Do not use any products that contain nicotine or tobacco. These products include cigarettes, chewing tobacco, and vaping devices, such as e-cigarettes. If you need help quitting, ask your health care provider. Activity  Follow a regular exercise program to stay fit. This will help you maintain your balance. Ask your health care provider what types of exercise are appropriate for you. If you need a cane or walker, use it as recommended by your health care provider. Wear supportive shoes that have nonskid soles. Safety  Remove any tripping hazards, such as rugs, cords, and clutter. Install safety equipment such as grab bars in bathrooms and safety rails on stairs. Keep rooms and walkways   well-lit. General instructions Talk with your health care provider about your risks for falling. Tell your health care provider if: You fall. Be sure to tell your health care provider about all falls, even ones that seem minor. You feel dizzy, tiredness (fatigue), or off-balance. Take over-the-counter and prescription medicines only as told by your health care provider. These include  supplements. Eat a healthy diet and maintain a healthy weight. A healthy diet includes low-fat dairy products, low-fat (lean) meats, and fiber from whole grains, beans, and lots of fruits and vegetables. Stay current with your vaccines. Schedule regular health, dental, and eye exams. Summary Having a healthy lifestyle and getting preventive care can help to protect your health and wellness after age 78. Screening and testing are the best way to find a health problem early and help you avoid having a fall. Early diagnosis and treatment give you the best chance for managing medical conditions that are more common for people who are older than age 78. Falls are a major cause of broken bones and head injuries in people who are older than age 78. Take precautions to prevent a fall at home. Work with your health care provider to learn what changes you can make to improve your health and wellness and to prevent falls. This information is not intended to replace advice given to you by your health care provider. Make sure you discuss any questions you have with your health care provider. Document Revised: 10/20/2020 Document Reviewed: 10/20/2020 Elsevier Patient Education  2024 Elsevier Inc.  

## 2023-06-01 NOTE — Assessment & Plan Note (Signed)
Well-controlled hypertension Continue amlodipine 10 mg and Hyzaar 100-12.5 mg daily   BP Readings from Last 3 Encounters:  06/01/23 (!) 144/72  01/11/23 (!) 150/83  11/30/22 134/72

## 2023-07-25 DIAGNOSIS — D492 Neoplasm of unspecified behavior of bone, soft tissue, and skin: Secondary | ICD-10-CM | POA: Diagnosis not present

## 2023-07-25 DIAGNOSIS — D0439 Carcinoma in situ of skin of other parts of face: Secondary | ICD-10-CM | POA: Diagnosis not present

## 2023-07-25 DIAGNOSIS — Z85828 Personal history of other malignant neoplasm of skin: Secondary | ICD-10-CM | POA: Diagnosis not present

## 2023-07-25 DIAGNOSIS — L538 Other specified erythematous conditions: Secondary | ICD-10-CM | POA: Diagnosis not present

## 2023-07-25 DIAGNOSIS — Z86007 Personal history of in-situ neoplasm of skin: Secondary | ICD-10-CM | POA: Diagnosis not present

## 2023-07-25 DIAGNOSIS — Z08 Encounter for follow-up examination after completed treatment for malignant neoplasm: Secondary | ICD-10-CM | POA: Diagnosis not present

## 2023-07-25 DIAGNOSIS — L820 Inflamed seborrheic keratosis: Secondary | ICD-10-CM | POA: Diagnosis not present

## 2023-07-25 DIAGNOSIS — L570 Actinic keratosis: Secondary | ICD-10-CM | POA: Diagnosis not present

## 2023-07-25 DIAGNOSIS — R58 Hemorrhage, not elsewhere classified: Secondary | ICD-10-CM | POA: Diagnosis not present

## 2023-07-25 DIAGNOSIS — Z85820 Personal history of malignant melanoma of skin: Secondary | ICD-10-CM | POA: Diagnosis not present

## 2023-08-09 ENCOUNTER — Encounter: Payer: Self-pay | Admitting: Adult Health

## 2023-08-09 ENCOUNTER — Ambulatory Visit: Payer: Medicare Other | Admitting: Adult Health

## 2023-08-09 VITALS — BP 130/72 | HR 89 | Ht 67.0 in | Wt 144.8 lb

## 2023-08-09 DIAGNOSIS — G3101 Pick's disease: Secondary | ICD-10-CM

## 2023-08-09 DIAGNOSIS — F0280 Dementia in other diseases classified elsewhere without behavioral disturbance: Secondary | ICD-10-CM

## 2023-08-09 DIAGNOSIS — G3109 Other frontotemporal dementia: Secondary | ICD-10-CM

## 2023-08-09 NOTE — Progress Notes (Signed)
 PATIENT: Martin Edwards DOB: 11-Oct-1945  REASON FOR VISIT: follow up HISTORY FROM: patient Primary neurologist: Dr. Terrace Arabia   Chief Complaint  Patient presents with   Follow-up    Pt in 4 with daughter  Daughter states short term memory is a little worse      HISTORY OF PRESENT ILLNESS: Today 08/09/23:  Martin Edwards is a 78 y.o. male with a history of frontotemporal dementia and seizure-like activity. Returns today for follow-up.  Patient is here with his daughter.  Does feel that short-term memory is slightly worse.  He continues to live home alone.  His son does live nearby.  His daughter lives in [place].  Patient reports that he is able to complete all ADLs independently.  He states that he completes all household chores daughter states that she probably needs to check on this.  Currently the patient is managing his own medications appointments and finances.  Denies any changes in mood or behavior.  Daughter states that he is most frustrated by his speech.  States that sometimes he knows what he wants to say but the words will not come out.  Patient is still operating a motor vehicle.  Daughter does state that on Christmas eve he got lost driving 2 miles to her mother's house.  He returns today for an evaluation.  IMPRESSION: This MRI of the brain without contrast shows the following: 1.   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 2.   There is marked asymmetry of the atrophy with multiple atrophy in the left frontal and parietal lobes compared to the right side and in the right medial temporal lobe compared to the left side.  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 3.   No acute findings.   01/11/23: Martin Edwards is a 78 y.o. male with a history of Frontotemporal Dementia and seizure like activity. Returns today for follow-up. Here with his son. Feels that he is stable. Lives at home alone. Able to complete all ADLs independently. He  continues to manages her own medications, appts and finances. No change in Mood or behavior. No trouble sleeping. PCP has him on Ensure to help with weight loss.   To note [name] diagnosed the patient with possible frontotemporal dementia after MRI report.  Patient has not reported any changes in his behavior.   117/24: Martin Edwards is a 78 y.o. male with a history of Frontotemporal Dementia and seizure like activity. Returns today for follow-up. Here with his son. Son reports memory is stable. Able to complete all ADLS independently. He manages he own mediations, appts and finances. Lives alone. No changes in mood or behavior. Sleeps well. Denies any seizure like activity.   12/07/21: Martin Edwards is a 78 year old male with a history of frontotemporal dementia and seizure-like activity.  He returns today for follow-up.  No behavioral issues per son. No significant changes with his memory. Lives at home alone. Able to complete all ADLS independently. Manages all medications, appts and finances independently. Son is available to help when needed. Denies any seizure like activity.  Son reports that he has had a low B12 level.  Was unable to tolerate oral B12.  Reports that PCP mention sublingual B12 but he has not started this yet.  Reports there is been no mention of B12 injections.  Advised that he should continue following up with his PCP for B12 levels  06/04/21: Martin Edwards is a 78 year old male with a history of  traumatic brain injury, memory disturbance and seizure events.  He returns today for follow-up.  The patient has not had any seizure-like episodes.  Not currently on any medication.  [name] felt that after his MRI he most likely had frontotemporal dementia.  He is not on any medication at this time.  Patient notes that his behavior has remained stable.  His son states that there was been only 1 occasion that he switch his insurance abruptly.  He lives at home alone.  He is able to  complete all ADLs independently.  He does operate a motor vehicle without difficulty.  Does report that he only drives to familiar places.  Returns today for an evaluation.  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  The patient was seen previously through this office for the seizures and he was eventually able to come off of his seizure medications and is doing well.  In 2019, he had an infection of the bone flap and had to have this removed, the patient claims that since then he has had some increased problems with memory.  He has had troubles with memory since the original head trauma that occurred on 11 Nov 2014.  He particularly noted problems with word finding, but this issue has gradually worsened over time.  He has had short-term memory issues, but he has not altered any activities of daily living.  He still operates a motor vehicle without difficulty, he is keeping up with his own medications and appointments.  He does his finances.  He currently lives alone.  He claims that he sleeps fairly well and has a good energy level.  He denies any numbness or weakness of extremities, he denies headaches or dizziness.  His primary care physician checked blood work recently that showed evidence of a low vitamin B12 level, he has not yet gone on any supplementation.  He denies any depression problems.  He comes to this office for an evaluation.  REVIEW OF SYSTEMS: Out of a complete 14 system review of symptoms, the patient complains only of the following symptoms, and all other reviewed systems are negative.  ALLERGIES: Allergies  Allergen Reactions   Other Rash    Needs Hypoallergenic Sheets/ Laundry Cart had questionable allergic reaction to detergent last time he was here.    HOME MEDICATIONS: Outpatient Medications Prior to Visit  Medication Sig Dispense Refill   amLODipine  (NORVASC) 10 MG tablet TAKE 1 TABLET BY MOUTH DAILY 100 tablet 2   atorvastatin (LIPITOR) 40 MG tablet TAKE 1 TABLET BY MOUTH DAILY 100 tablet 2   Cyanocobalamin (B-12 PO) Take by mouth.     famotidine (PEPCID) 20 MG tablet TAKE 1 TABLET BY MOUTH TWICE  DAILY AS NEEDED FOR HEARTBURN OR INDIGESTION 180 tablet 3   losartan-hydrochlorothiazide (HYZAAR) 100-12.5 MG tablet TAKE 1 TABLET BY MOUTH DAILY 100 tablet 2   pantoprazole (PROTONIX) 40 MG tablet TAKE 1 TABLET BY MOUTH DAILY (Patient not taking: Reported on 08/09/2023) 90 tablet 3   No facility-administered medications prior to visit.    PAST MEDICAL HISTORY: Past Medical History:  Diagnosis Date   Basal cell carcinoma of face X 2   Brow ptosis    GERD (gastroesophageal reflux disease)    History of kidney stones    Hyperlipidemia    Hypertension    Prostate cancer (HCC) 1999   Seizures (HCC)  11/2014   brain trauma -hematoma   Small bowel obstruction due to adhesions (HCC) 01/30/2014   Subdural hematoma (HCC)    Vitamin B12 deficiency 12/02/2020    PAST SURGICAL HISTORY: Past Surgical History:  Procedure Laterality Date   APPLICATION OF A-CELL OF HEAD/NECK N/A 08/29/2017   Procedure: APPLICATION OF A-CELL;  Surgeon: Peggye Form, DO;  Location: [facility] OR;  Service: Plastics;  Laterality: N/A;   BASAL CELL CARCINOMA EXCISION  X 2   "off my face both times"   BRAIN SURGERY     COLECTOMY  10/2010   "took out 8 inches of colon; wasn't cancer"   CRANIOTOMY Right 11/11/2014   Procedure: CRANIOTOMY HEMATOMA EVACUATION SUBDURAL;  Surgeon: Hilda Lias, MD;  Location: [facility];  Service: Neurosurgery;  Laterality: Right;   CRANIOTOMY Right 11/13/2014   Procedure: Removal bone flap, insertion into the abdominal wall;  Surgeon: Hilda Lias, MD;  Location: [facility];  Service: Neurosurgery;  Laterality: Right;   CRANIOTOMY N/A 03/07/2015   Procedure: Cranioplasty using Bone flap in abdomen;  Surgeon: Hilda Lias, MD;   Location: [facility];  Service: Neurosurgery;  Laterality: N/A;  Cranioplasty using Bone flap in abdomen   CRANIOTOMY N/A 08/29/2017   Procedure: CRANIECTOMY AND DEBRIDEMENT;  Surgeon: Shirlean Kelly, MD;  Location: [facility] OR;  Service: Neurosurgery;  Laterality: N/A;  CRANIECTOMY AND DEBRIDEMENT   EXCISION MASS NECK Right 04/14/2016   Procedure: EXCISION BIOPSY OF RIGHT NECK LYMPH NODE;  Surgeon: Osborn Coho, MD;  Location: [facility] OR;  Service: ENT;  Laterality: Right;   EXCISION MELANOMA WITH SENTINEL LYMPH NODE BIOPSY Right 04/14/2016   Procedure: WIDE EXCISION MELANOMA RIGHT SHOULDER WITH SENTINEL  NODE MAPPING;  Surgeon: Chevis Pretty III, MD;  Location: [facility] OR;  Service: General;  Laterality: Right;   PROSTATECTOMY  05/20/1998   SCALP LACERATION REPAIR N/A 08/29/2017   Procedure: SCALP FLAP TO COVER DEFECT;  Surgeon: Peggye Form, DO;  Location: [facility] OR;  Service: Plastics;  Laterality: N/A;    FAMILY HISTORY: Family History  Problem Relation Age of Onset   Cancer Father        Lung   Alzheimer's disease Sister    Dementia Neg Hx     SOCIAL HISTORY: Social History   Socioeconomic History   Marital status: Divorced    Spouse name: Not on file   Number of children: 2   Years of education: 12   Highest education level: Not on file  Occupational History   Occupation: retired  Tobacco Use   Smoking status: Former   Smokeless tobacco: Never  Vaping Use   Vaping status: Never Used  Substance and Sexual Activity   Alcohol use: Not Currently    Comment: 01/30/2014 "maybe a beer twice/yr"   Drug use: No   Sexual activity: Not Currently  Other Topics Concern   Not on file  Social History Narrative   Exercise: daily if he can, does walking    Lives alone   Patient drinks 2 cups of caffeine daily.   Patient is right handed.   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 Risk  (12/07/2022)   Overall Financial Resource Strain (CARDIA)     Difficulty of Paying Living Expenses: Not hard at all  Food Insecurity: No Food Insecurity (12/07/2022)   Hunger Vital Sign    Worried About Running Out of Food in the Last Year: Never true  Ran Out of Food in the Last Year: Never true  Transportation Needs: No Transportation Needs (12/07/2022)   PRAPARE - Administrator, Civil Service (Medical): No    Lack of Transportation (Non-Medical): No  Physical Activity: Sufficiently Active (12/07/2022)   Exercise Vital Sign    Days of Exercise per Week: 7 days    Minutes of Exercise per Session: 30 min  Stress: No Stress Concern Present (12/07/2022)   [employer] of Occupational Health - Occupational Stress Questionnaire    Feeling of Stress : Not at all  Social Connections: Socially Isolated (12/07/2022)   Social Connection and Isolation Panel [NHANES]    Frequency of Communication with Friends and Family: More than three times a week    Frequency of Social Gatherings with Friends and Family: More than three times a week    Attends Religious Services: Never    Database administrator or Organizations: No    Attends Banker Meetings: Never    Marital Status: Divorced  Catering manager Violence: Not At Risk (12/07/2022)   Humiliation, Afraid, Rape, and Kick questionnaire    Fear of Current or Ex-Partner: No    Emotionally Abused: No    Physically Abused: No    Sexually Abused: No      PHYSICAL EXAM  Vitals:   08/09/23 1043  BP: 130/72  Pulse: 89  Weight: 144 lb 12.8 oz (65.7 kg)  Height: 5\' 7"  (1.702 m)   Body mass index is 22.68 kg/m.     08/09/2023   10:45 AM 01/11/2023   10:56 AM 06/30/2022   10:30 AM  MMSE - Mini Mental State Exam  Orientation to time 2 5 4   Orientation to Place 3 3 4   Registration 3 3 3   Attention/ Calculation 0 0 0  Recall 3 3 3   Language- name 2 objects 2 2 0  Language- repeat 0 0 0  Language- follow 3 step command 1 3 3   Language- read & follow direction 0 0 1   Write a sentence 0 0 1  Copy design 0 0 0  Total score 14 19 19      Generalized: Well developed, in no acute distress   Neurological examination  Mentation: Alert oriented to time, place, history taking. Follows all commands speech and language fluent Cranial nerve II-XII: Pupils were equal round reactive to light. Extraocular movements were full, visual field were full on confrontational test. Facial sensation and strength were normal. Head turning and shoulder shrug  were normal and symmetric. Motor: The motor testing reveals 5 over 5 strength of all 4 extremities. Good symmetric motor tone is noted throughout.  Sensory: Sensory testing is intact to soft touch on all 4 extremities. No evidence of extinction is noted.  Coordination: Cerebellar testing reveals good finger-nose-finger and heel-to-shin bilaterally.  Gait and station: Gait is normal.  Reflexes: Deep tendon reflexes are symmetric and normal bilaterally.   DIAGNOSTIC DATA (LABS, IMAGING, TESTING) - I reviewed patient records, labs, notes, testing and imaging myself where available.  Lab Results  Component Value Date   WBC 5.6 11/11/2022   HGB 15.5 11/11/2022   HCT 47.2 11/11/2022   MCV 92.9 11/11/2022   PLT 188.0 11/11/2022      Component Value Date/Time   NA 136 11/11/2022 1045   NA 137 04/30/2020 1107   K 4.3 11/11/2022 1045   CL 97 11/11/2022 1045   CO2 28 11/11/2022 1045   GLUCOSE 130 (H) 11/11/2022 1045  BUN 8 11/11/2022 1045   BUN 10 04/30/2020 1107   CREATININE 1.06 11/11/2022 1045   CREATININE 0.97 01/08/2016 1010   CALCIUM 9.7 11/11/2022 1045   PROT 8.4 (H) 11/11/2022 1045   PROT 8.0 04/30/2020 1107   ALBUMIN 4.8 11/11/2022 1045   ALBUMIN 5.0 (H) 04/30/2020 1107   AST 21 11/11/2022 1045   ALT 22 11/11/2022 1045   ALKPHOS 73 11/11/2022 1045   BILITOT 1.3 (H) 11/11/2022 1045   BILITOT 0.9 04/30/2020 1107   GFRNONAA 69 04/30/2020 1107   GFRNONAA 79 01/08/2016 1010   GFRAA 80 04/30/2020 1107    GFRAA >89 01/08/2016 1010   Lab Results  Component Value Date   CHOL 128 11/11/2022   HDL 60.70 11/11/2022   LDLCALC 55 11/11/2022   TRIG 63.0 11/11/2022   CHOLHDL 2 11/11/2022   Lab Results  Component Value Date   HGBA1C 5.8 (A) 06/01/2023   Lab Results  Component Value Date   VITAMINB12 614 11/11/2022   Lab Results  Component Value Date   TSH 1.56 11/11/2022      ASSESSMENT AND PLAN 78 y.o. year old male  has a past medical history of Basal cell carcinoma of face (X 2), Brow ptosis, GERD (gastroesophageal reflux disease), History of kidney stones, Hyperlipidemia, Hypertension, Prostate cancer (HCC) (1999), Seizures (HCC) (11/2014), Small bowel obstruction due to adhesions (HCC) (01/30/2014), Subdural hematoma (HCC), and Vitamin B12 deficiency (12/02/2020). here with:  1.  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 --Advised that I do believe his driving should be restricted due to decrease in memory score and history of getting lost while driving.  Daughter plans to talk to her brother regarding this. --Did encourage the patient's daughter to ensure the patient is doing things appropriately such as ADLs, med management and household chores. --We will follow-up in 6 months or sooner if needed   Butch Penny, MSN, NP-C 08/09/2023, 10:48 AM [facility] [address], [address] [place], [place] [address] [phone]

## 2023-08-22 NOTE — Progress Notes (Signed)
 Chart reviewed, agree above plan ?

## 2023-09-07 DIAGNOSIS — H11043 Peripheral pterygium, stationary, bilateral: Secondary | ICD-10-CM | POA: Diagnosis not present

## 2023-09-07 DIAGNOSIS — H40013 Open angle with borderline findings, low risk, bilateral: Secondary | ICD-10-CM | POA: Diagnosis not present

## 2023-09-07 DIAGNOSIS — H2513 Age-related nuclear cataract, bilateral: Secondary | ICD-10-CM | POA: Diagnosis not present

## 2023-11-01 DIAGNOSIS — Z85828 Personal history of other malignant neoplasm of skin: Secondary | ICD-10-CM | POA: Diagnosis not present

## 2023-11-01 DIAGNOSIS — Z08 Encounter for follow-up examination after completed treatment for malignant neoplasm: Secondary | ICD-10-CM | POA: Diagnosis not present

## 2023-11-01 DIAGNOSIS — L218 Other seborrheic dermatitis: Secondary | ICD-10-CM | POA: Diagnosis not present

## 2023-11-01 DIAGNOSIS — L814 Other melanin hyperpigmentation: Secondary | ICD-10-CM | POA: Diagnosis not present

## 2023-11-01 DIAGNOSIS — L821 Other seborrheic keratosis: Secondary | ICD-10-CM | POA: Diagnosis not present

## 2023-11-01 DIAGNOSIS — D225 Melanocytic nevi of trunk: Secondary | ICD-10-CM | POA: Diagnosis not present

## 2023-12-13 ENCOUNTER — Ambulatory Visit: Payer: Medicare Other

## 2023-12-20 ENCOUNTER — Encounter: Payer: Self-pay | Admitting: Emergency Medicine

## 2023-12-20 ENCOUNTER — Ambulatory Visit: Payer: Self-pay | Admitting: Emergency Medicine

## 2023-12-20 ENCOUNTER — Ambulatory Visit (INDEPENDENT_AMBULATORY_CARE_PROVIDER_SITE_OTHER): Admitting: Emergency Medicine

## 2023-12-20 VITALS — BP 134/72 | HR 107 | Temp 98.3°F | Ht 67.0 in | Wt 139.0 lb

## 2023-12-20 DIAGNOSIS — E785 Hyperlipidemia, unspecified: Secondary | ICD-10-CM | POA: Diagnosis not present

## 2023-12-20 DIAGNOSIS — I10 Essential (primary) hypertension: Secondary | ICD-10-CM

## 2023-12-20 DIAGNOSIS — G3109 Other frontotemporal dementia: Secondary | ICD-10-CM | POA: Diagnosis not present

## 2023-12-20 DIAGNOSIS — R7303 Prediabetes: Secondary | ICD-10-CM

## 2023-12-20 DIAGNOSIS — C434 Malignant melanoma of scalp and neck: Secondary | ICD-10-CM | POA: Diagnosis not present

## 2023-12-20 DIAGNOSIS — F0280 Dementia in other diseases classified elsewhere without behavioral disturbance: Secondary | ICD-10-CM

## 2023-12-20 LAB — CBC WITH DIFFERENTIAL/PLATELET
Basophils Absolute: 0 K/uL (ref 0.0–0.1)
Basophils Relative: 0.4 % (ref 0.0–3.0)
Eosinophils Absolute: 0 K/uL (ref 0.0–0.7)
Eosinophils Relative: 0 % (ref 0.0–5.0)
HCT: 44 % (ref 39.0–52.0)
Hemoglobin: 14.8 g/dL (ref 13.0–17.0)
Lymphocytes Relative: 18.3 % (ref 12.0–46.0)
Lymphs Abs: 0.9 K/uL (ref 0.7–4.0)
MCHC: 33.6 g/dL (ref 30.0–36.0)
MCV: 91.8 fl (ref 78.0–100.0)
Monocytes Absolute: 0.4 K/uL (ref 0.1–1.0)
Monocytes Relative: 7.8 % (ref 3.0–12.0)
Neutro Abs: 3.6 K/uL (ref 1.4–7.7)
Neutrophils Relative %: 73.5 % (ref 43.0–77.0)
Platelets: 157 K/uL (ref 150.0–400.0)
RBC: 4.79 Mil/uL (ref 4.22–5.81)
RDW: 13.7 % (ref 11.5–15.5)
WBC: 4.9 K/uL (ref 4.0–10.5)

## 2023-12-20 LAB — COMPREHENSIVE METABOLIC PANEL WITH GFR
ALT: 15 U/L (ref 0–53)
AST: 16 U/L (ref 0–37)
Albumin: 4.5 g/dL (ref 3.5–5.2)
Alkaline Phosphatase: 61 U/L (ref 39–117)
BUN: 10 mg/dL (ref 6–23)
CO2: 31 meq/L (ref 19–32)
Calcium: 9.5 mg/dL (ref 8.4–10.5)
Chloride: 98 meq/L (ref 96–112)
Creatinine, Ser: 0.99 mg/dL (ref 0.40–1.50)
GFR: 72.95 mL/min (ref >60.00)
Glucose, Bld: 130 mg/dL — ABNORMAL HIGH (ref 70–99)
Potassium: 3.9 meq/L (ref 3.5–5.1)
Sodium: 137 meq/L (ref 135–145)
Total Bilirubin: 1.4 mg/dL — ABNORMAL HIGH (ref 0.2–1.2)
Total Protein: 7.4 g/dL (ref 6.0–8.3)

## 2023-12-20 LAB — LIPID PANEL
Cholesterol: 136 mg/dL (ref 0–200)
HDL: 60.5 mg/dL (ref >39.00)
LDL Cholesterol: 63 mg/dL (ref 0–99)
NonHDL: 75.3
Total CHOL/HDL Ratio: 2
Triglycerides: 62 mg/dL (ref 0.0–149.0)
VLDL: 12.4 mg/dL (ref 0.0–40.0)

## 2023-12-20 LAB — PSA: PSA: 0.57 ng/mL (ref 0.10–4.00)

## 2023-12-20 LAB — HEMOGLOBIN A1C: Hgb A1c MFr Bld: 6.3 % (ref 4.6–6.5)

## 2023-12-20 NOTE — Assessment & Plan Note (Signed)
Stable.  Sees neurologist on a regular basis. ?

## 2023-12-20 NOTE — Assessment & Plan Note (Signed)
 BP Readings from Last 3 Encounters:  12/20/23 134/72  08/09/23 130/72  06/01/23 (!) 144/72  Well-controlled hypertension Continue amlodipine  10 mg daily and Hyzaar 100-12.5 mg daily

## 2023-12-20 NOTE — Assessment & Plan Note (Signed)
 Chronic stable condition Continue atorvastatin  40 mg daily Lipid profile done today

## 2023-12-20 NOTE — Patient Instructions (Signed)
 Health Maintenance After Age 79 After age 4, you are at a higher risk for certain long-term diseases and infections as well as injuries from falls. Falls are a major cause of broken bones and head injuries in people who are older than age 47. Getting regular preventive care can help to keep you healthy and well. Preventive care includes getting regular testing and making lifestyle changes as recommended by your health care provider. Talk with your health care provider about: Which screenings and tests you should have. A screening is a test that checks for a disease when you have no symptoms. A diet and exercise plan that is right for you. What should I know about screenings and tests to prevent falls? Screening and testing are the best ways to find a health problem early. Early diagnosis and treatment give you the best chance of managing medical conditions that are common after age 37. Certain conditions and lifestyle choices may make you more likely to have a fall. Your health care provider may recommend: Regular vision checks. Poor vision and conditions such as cataracts can make you more likely to have a fall. If you wear glasses, make sure to get your prescription updated if your vision changes. Medicine review. Work with your health care provider to regularly review all of the medicines you are taking, including over-the-counter medicines. Ask your health care provider about any side effects that may make you more likely to have a fall. Tell your health care provider if any medicines that you take make you feel dizzy or sleepy. Strength and balance checks. Your health care provider may recommend certain tests to check your strength and balance while standing, walking, or changing positions. Foot health exam. Foot pain and numbness, as well as not wearing proper footwear, can make you more likely to have a fall. Screenings, including: Osteoporosis screening. Osteoporosis is a condition that causes  the bones to get weaker and break more easily. Blood pressure screening. Blood pressure changes and medicines to control blood pressure can make you feel dizzy. Depression screening. You may be more likely to have a fall if you have a fear of falling, feel depressed, or feel unable to do activities that you used to do. Alcohol use screening. Using too much alcohol can affect your balance and may make you more likely to have a fall. Follow these instructions at home: Lifestyle Do not drink alcohol if: Your health care provider tells you not to drink. If you drink alcohol: Limit how much you have to: 0-1 drink a day for women. 0-2 drinks a day for men. Know how much alcohol is in your drink. In the [place] one drink equals one 12 oz bottle of beer (355 mL), one 5 oz glass of wine (148 mL), or one 1 oz glass of hard liquor (44 mL). Do not use any products that contain nicotine or tobacco. These products include cigarettes, chewing tobacco, and vaping devices, such as e-cigarettes. If you need help quitting, ask your health care provider. Activity  Follow a regular exercise program to stay fit. This will help you maintain your balance. Ask your health care provider what types of exercise are appropriate for you. If you need a cane or walker, use it as recommended by your health care provider. Wear supportive shoes that have nonskid soles. Safety  Remove any tripping hazards, such as rugs, cords, and clutter. Install safety equipment such as grab bars in bathrooms and safety rails on stairs. Keep rooms and walkways  well-lit. General instructions Talk with your health care provider about your risks for falling. Tell your health care provider if: You fall. Be sure to tell your health care provider about all falls, even ones that seem minor. You feel dizzy, tiredness (fatigue), or off-balance. Take over-the-counter and prescription medicines only as told by your health care provider. These include  supplements. Eat a healthy diet and maintain a healthy weight. A healthy diet includes low-fat dairy products, low-fat (lean) meats, and fiber from whole grains, beans, and lots of fruits and vegetables. Stay current with your vaccines. Schedule regular health, dental, and eye exams. Summary Having a healthy lifestyle and getting preventive care can help to protect your health and wellness after age 11. Screening and testing are the best way to find a health problem early and help you avoid having a fall. Early diagnosis and treatment give you the best chance for managing medical conditions that are more common for people who are older than age 28. Falls are a major cause of broken bones and head injuries in people who are older than age 48. Take precautions to prevent a fall at home. Work with your health care provider to learn what changes you can make to improve your health and wellness and to prevent falls. This information is not intended to replace advice given to you by your health care provider. Make sure you discuss any questions you have with your health care provider. Document Revised: 10/20/2020 Document Reviewed: 10/20/2020 Elsevier Patient Education  2024 [employer].

## 2023-12-20 NOTE — Progress Notes (Signed)
 Martin Edwards 78 y.o.   Chief Complaint  Patient presents with   Follow-up    Patient here for 6 month f/u. No other concerns     HISTORY OF PRESENT ILLNESS: This is a 78 y.o. male here for 69-month follow-up of chronic medical conditions Unaccompanied today.  Drove himself to this location. Wt Readings from Last 3 Encounters:  12/20/23 139 lb (63 kg)  12/13/23 144 lb (65.3 kg)  08/09/23 144 lb 12.8 oz (65.7 kg)  Last neurologist office visit assessment and plan as follows:  ASSESSMENT AND PLAN 78 y.o. year old male  has a past medical history of Basal cell carcinoma of face (X 2), Brow ptosis, GERD (gastroesophageal reflux disease), History of kidney stones, Hyperlipidemia, Hypertension, Prostate cancer (HCC) (1999), Seizures (HCC) (11/2014), Small bowel obstruction due to adhesions (HCC) (01/30/2014), Subdural hematoma (HCC), and Vitamin B12 deficiency (12/02/2020). here with:  1.  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 --Advised that I do believe his driving should be restricted due to decrease in memory score and history of getting lost while driving.  Daughter plans to talk to her brother regarding this. --Did encourage the patient's daughter to ensure the patient is doing things appropriately such as ADLs, med management and household chores. --We will follow-up in 6 months or sooner if needed     Duwaine Russell, MSN, NP-C 08/09/2023, 10:48 AM [facility] [address], [address] [place], [place] [address] [phone]  HPI   Prior to Admission medications   Medication Sig Start Date End Date Taking? Authorizing Provider  amLODipine  (NORVASC ) 10 MG tablet TAKE 1 TABLET BY MOUTH DAILY 06/01/23  Yes Peni Rupard, Emil Schanz, MD  atorvastatin  (LIPITOR) 40 MG tablet TAKE 1 TABLET BY MOUTH DAILY 06/01/23  Yes Margeaux Swantek, Emil Schanz, MD   Cyanocobalamin  (B-12 PO) Take by mouth.   Yes [provider]  famotidine  (PEPCID ) 20 MG tablet TAKE 1 TABLET BY MOUTH TWICE  DAILY AS NEEDED FOR HEARTBURN OR INDIGESTION 01/11/22  Yes Delphia Kaylor Jose, MD  losartan -hydrochlorothiazide  (HYZAAR) 100-12.5 MG tablet TAKE 1 TABLET BY MOUTH DAILY 06/01/23  Yes Vallorie Niccoli, Emil Schanz, MD  pantoprazole  (PROTONIX ) 40 MG tablet TAKE 1 TABLET BY MOUTH DAILY Patient not taking: Reported on 12/20/2023 01/11/22   Purcell Emil Schanz, MD    Allergies  Allergen Reactions   Other Rash    Needs Hypoallergenic Sheets/ Laundry Cart had questionable allergic reaction to detergent last time he was here.    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   History of cranioplasty 03/07/2015   Seizures (HCC) 11/27/2014   Fine motor impairment 11/19/2014   Prostate cancer (HCC) 12/12/2012   Essential hypertension 10/21/2011   Dyslipidemia 10/21/2011   Adenomatous colon polyp 10/21/2011    Past Medical History:  Diagnosis Date   Basal cell carcinoma of face X 2   Brow ptosis    GERD (gastroesophageal reflux disease)  History of kidney stones    Hyperlipidemia    Hypertension    Prostate cancer (HCC) 1999   Seizures (HCC) 11/2014   brain trauma -hematoma   Small bowel obstruction due to adhesions (HCC) 01/30/2014   Subdural hematoma (HCC)    Vitamin B12 deficiency 12/02/2020    Past Surgical History:  Procedure Laterality Date   APPLICATION OF  A-CELL OF HEAD/NECK N/A 08/29/2017   Procedure: APPLICATION OF A-CELL;  Surgeon: Lowery Estefana RAMAN, DO;  Location: [facility] OR;  Service: Plastics;  Laterality: N/A;   BASAL CELL CARCINOMA EXCISION  X 2   off my face both times   BRAIN SURGERY     COLECTOMY  10/2010   took out 8 inches of colon; wasn't cancer   CRANIOTOMY Right 11/11/2014   Procedure: CRANIOTOMY HEMATOMA EVACUATION SUBDURAL;  Surgeon: Catalina Stains, MD;  Location: [facility];  Service: Neurosurgery;  Laterality: Right;   CRANIOTOMY Right 11/13/2014   Procedure: Removal bone flap, insertion into the abdominal wall;  Surgeon: Catalina Stains, MD;  Location: [facility];  Service: Neurosurgery;  Laterality: Right;   CRANIOTOMY N/A 03/07/2015   Procedure: Cranioplasty using Bone flap in abdomen;  Surgeon: Catalina Stains, MD;  Location: [facility];  Service: Neurosurgery;  Laterality: N/A;  Cranioplasty using Bone flap in abdomen   CRANIOTOMY N/A 08/29/2017   Procedure: CRANIECTOMY AND DEBRIDEMENT;  Surgeon: Alix Charleston, MD;  Location: [facility] OR;  Service: Neurosurgery;  Laterality: N/A;  CRANIECTOMY AND DEBRIDEMENT   EXCISION MASS NECK Right 04/14/2016   Procedure: EXCISION BIOPSY OF RIGHT NECK LYMPH NODE;  Surgeon: Alm Bouche, MD;  Location: [facility] OR;  Service: ENT;  Laterality: Right;   EXCISION MELANOMA WITH SENTINEL LYMPH NODE BIOPSY Right 04/14/2016   Procedure: WIDE EXCISION MELANOMA RIGHT SHOULDER WITH SENTINEL  NODE MAPPING;  Surgeon: Deward Null III, MD;  Location: [facility] OR;  Service: General;  Laterality: Right;   PROSTATECTOMY  05/20/1998   SCALP LACERATION REPAIR N/A 08/29/2017   Procedure: SCALP FLAP TO COVER DEFECT;  Surgeon: Lowery Estefana RAMAN, DO;  Location: [facility] OR;  Service: Plastics;  Laterality: N/A;    Social History   Socioeconomic History   Marital status: Divorced    Spouse name: Not on file   Number of children: 2   Years of education: 12   Highest education level: Not on file  Occupational History    Occupation: retired  Tobacco Use   Smoking status: Former   Smokeless tobacco: Never  Vaping Use   Vaping status: Never Used  Substance and Sexual Activity   Alcohol use: Not Currently    Comment: 01/30/2014 maybe a beer twice/yr   Drug use: No   Sexual activity: Not Currently  Other Topics Concern   Not on file  Social History Narrative   Exercise: daily if he can, does walking    Lives alone   Patient drinks 2 cups of caffeine daily.   Patient is right handed.   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 Risk  (12/07/2022)   Overall Financial Resource Strain (CARDIA)    Difficulty of Paying Living Expenses: Not hard at all  Food Insecurity: No Food Insecurity (12/07/2022)   Hunger Vital Sign    Worried About Running Out of Food in the Last Year: Never true    Ran Out of Food in the Last Year: Never true  Transportation Needs: No Transportation Needs (12/07/2022)  PRAPARE - Administrator, Civil Service (Medical): No    Lack of Transportation (Non-Medical): No  Physical Activity: Sufficiently Active (12/07/2022)   Exercise Vital Sign    Days of Exercise per Week: 7 days    Minutes of Exercise per Session: 30 min  Stress: No Stress Concern Present (12/07/2022)   [employer] of Occupational Health - Occupational Stress Questionnaire    Feeling of Stress : Not at all  Social Connections: Socially Isolated (12/07/2022)   Social Connection and Isolation Panel    Frequency of Communication with Friends and Family: More than three times a week    Frequency of Social Gatherings with Friends and Family: More than three times a week    Attends Religious Services: Never    Database administrator or Organizations: No    Attends Banker Meetings: Never    Marital Status: Divorced  Catering manager Violence: Not At Risk (12/07/2022)   Humiliation, Afraid, Rape, and Kick questionnaire    Fear of Current or  Ex-Partner: No    Emotionally Abused: No    Physically Abused: No    Sexually Abused: No    Family History  Problem Relation Age of Onset   Cancer Father        Lung   Alzheimer's disease Sister    Dementia Neg Hx      Review of Systems  Constitutional: Negative.  Negative for chills and fever.  HENT: Negative.  Negative for congestion and sore throat.   Respiratory: Negative.  Negative for cough and shortness of breath.   Cardiovascular: Negative.  Negative for chest pain and palpitations.  Gastrointestinal:  Negative for abdominal pain, diarrhea, nausea and vomiting.  Genitourinary: Negative.  Negative for dysuria and hematuria.  Skin: Negative.  Negative for rash.  Neurological: Negative.  Negative for dizziness and headaches.  All other systems reviewed and are negative.   Vitals:   12/20/23 0933  BP: 134/72  Pulse: (!) 107  Temp: 98.3 F (36.8 C)  SpO2: 97%    Physical Exam Vitals reviewed.  Constitutional:      Appearance: Normal appearance.  HENT:     Head: Normocephalic.     Mouth/Throat:     Mouth: Mucous membranes are moist.     Pharynx: Oropharynx is clear.  Eyes:     Extraocular Movements: Extraocular movements intact.     Conjunctiva/sclera: Conjunctivae normal.     Pupils: Pupils are equal, round, and reactive to light.  Cardiovascular:     Rate and Rhythm: Normal rate and regular rhythm.     Pulses: Normal pulses.     Heart sounds: Normal heart sounds.  Pulmonary:     Effort: Pulmonary effort is normal.     Breath sounds: Normal breath sounds.  Abdominal:     Palpations: Abdomen is soft.     Tenderness: There is no abdominal tenderness.  Musculoskeletal:     Cervical back: No tenderness.  Lymphadenopathy:     Cervical: No cervical adenopathy.  Skin:    General: Skin is warm and dry.     Capillary Refill: Capillary refill takes less than 2 seconds.  Neurological:     General: No focal deficit present.     Mental Status: He is alert and  oriented to person, place, and time. Mental status is at baseline.  Psychiatric:        Mood and Affect: Mood normal.        Behavior: Behavior normal.  ASSESSMENT & PLAN: A total of 42 minutes was spent with the patient and counseling/coordination of care regarding preparing for this visit, review of most recent office visit notes, review of multiple chronic medical conditions and their management, review of all medications, review of most recent bloodwork results, review of health maintenance items, education on nutrition, prognosis, documentation, and need for follow up.   Problem List Items Addressed This Visit       Cardiovascular and Mediastinum   Essential hypertension - Primary   BP Readings from Last 3 Encounters:  12/20/23 134/72  08/09/23 130/72  06/01/23 (!) 144/72  Well-controlled hypertension Continue amlodipine  10 mg daily and Hyzaar 100-12.5 mg daily       Relevant Orders   Comprehensive metabolic panel with GFR   CBC with Differential/Platelet   PSA   Hemoglobin A1c   Lipid panel     Nervous and Auditory   Frontotemporal dementia (HCC)   Stable. Sees neurologist on a regular basis.         Other   Dyslipidemia   Chronic stable condition. Continue atorvastatin  40 mg daily  Lipid profile done today      Relevant Orders   Comprehensive metabolic panel with GFR   CBC with Differential/Platelet   PSA   Hemoglobin A1c   Lipid panel   Malignant melanoma of neck (HCC)   Stable.  Sees dermatologist on a regular basis.      Prediabetes   Stable. Diet and nutrition discussed.       Relevant Orders   Comprehensive metabolic panel with GFR   CBC with Differential/Platelet   PSA   Hemoglobin A1c   Lipid panel   Patient Instructions  Health Maintenance After Age 5 After age 56, you are at a higher risk for certain long-term diseases and infections as well as injuries from falls. Falls are a major cause of broken bones and head injuries in  people who are older than age 64. Getting regular preventive care can help to keep you healthy and well. Preventive care includes getting regular testing and making lifestyle changes as recommended by your health care provider. Talk with your health care provider about: Which screenings and tests you should have. A screening is a test that checks for a disease when you have no symptoms. A diet and exercise plan that is right for you. What should I know about screenings and tests to prevent falls? Screening and testing are the best ways to find a health problem early. Early diagnosis and treatment give you the best chance of managing medical conditions that are common after age 65. Certain conditions and lifestyle choices may make you more likely to have a fall. Your health care provider may recommend: Regular vision checks. Poor vision and conditions such as cataracts can make you more likely to have a fall. If you wear glasses, make sure to get your prescription updated if your vision changes. Medicine review. Work with your health care provider to regularly review all of the medicines you are taking, including over-the-counter medicines. Ask your health care provider about any side effects that may make you more likely to have a fall. Tell your health care provider if any medicines that you take make you feel dizzy or sleepy. Strength and balance checks. Your health care provider may recommend certain tests to check your strength and balance while standing, walking, or changing positions. Foot health exam. Foot pain and numbness, as well as not wearing proper footwear, can make  you more likely to have a fall. Screenings, including: Osteoporosis screening. Osteoporosis is a condition that causes the bones to get weaker and break more easily. Blood pressure screening. Blood pressure changes and medicines to control blood pressure can make you feel dizzy. Depression screening. You may be more likely to  have a fall if you have a fear of falling, feel depressed, or feel unable to do activities that you used to do. Alcohol use screening. Using too much alcohol can affect your balance and may make you more likely to have a fall. Follow these instructions at home: Lifestyle Do not drink alcohol if: Your health care provider tells you not to drink. If you drink alcohol: Limit how much you have to: 0-1 drink a day for women. 0-2 drinks a day for men. Know how much alcohol is in your drink. In the [place] one drink equals one 12 oz bottle of beer (355 mL), one 5 oz glass of wine (148 mL), or one 1 oz glass of hard liquor (44 mL). Do not use any products that contain nicotine or tobacco. These products include cigarettes, chewing tobacco, and vaping devices, such as e-cigarettes. If you need help quitting, ask your health care provider. Activity  Follow a regular exercise program to stay fit. This will help you maintain your balance. Ask your health care provider what types of exercise are appropriate for you. If you need a cane or walker, use it as recommended by your health care provider. Wear supportive shoes that have nonskid soles. Safety  Remove any tripping hazards, such as rugs, cords, and clutter. Install safety equipment such as grab bars in bathrooms and safety rails on stairs. Keep rooms and walkways well-lit. General instructions Talk with your health care provider about your risks for falling. Tell your health care provider if: You fall. Be sure to tell your health care provider about all falls, even ones that seem minor. You feel dizzy, tiredness (fatigue), or off-balance. Take over-the-counter and prescription medicines only as told by your health care provider. These include supplements. Eat a healthy diet and maintain a healthy weight. A healthy diet includes low-fat dairy products, low-fat (lean) meats, and fiber from whole grains, beans, and lots of fruits and vegetables. Stay  current with your vaccines. Schedule regular health, dental, and eye exams. Summary Having a healthy lifestyle and getting preventive care can help to protect your health and wellness after age 22. Screening and testing are the best way to find a health problem early and help you avoid having a fall. Early diagnosis and treatment give you the best chance for managing medical conditions that are more common for people who are older than age 3. Falls are a major cause of broken bones and head injuries in people who are older than age 39. Take precautions to prevent a fall at home. Work with your health care provider to learn what changes you can make to improve your health and wellness and to prevent falls. This information is not intended to replace advice given to you by your health care provider. Make sure you discuss any questions you have with your health care provider. Document Revised: 10/20/2020 Document Reviewed: 10/20/2020 Elsevier Patient Education  2024 [employer]    Emil Schaumann, MD [facility]

## 2023-12-20 NOTE — Assessment & Plan Note (Signed)
Stable.  Diet and nutrition discussed. 

## 2023-12-20 NOTE — Assessment & Plan Note (Signed)
Stable.  Sees dermatologist on a regular basis.

## 2023-12-23 ENCOUNTER — Ambulatory Visit (INDEPENDENT_AMBULATORY_CARE_PROVIDER_SITE_OTHER)

## 2023-12-23 ENCOUNTER — Encounter: Payer: Self-pay | Admitting: Emergency Medicine

## 2023-12-23 VITALS — Ht 67.0 in | Wt 139.0 lb

## 2023-12-23 DIAGNOSIS — Z0000 Encounter for general adult medical examination without abnormal findings: Secondary | ICD-10-CM

## 2023-12-23 NOTE — Progress Notes (Signed)
 Subjective:  Please attest and cosign this visit due to patients primary care provider not being in the office at the time the visit was completed.  (Pt of Dr CHRISTELLA. Purcell)   Martin Edwards is a 78 y.o. who presents for a Medicare Wellness preventive visit.  As a reminder, Annual Wellness Visits don't include a physical exam, and some assessments may be limited, especially if this visit is performed virtually. We may recommend an in-person follow-up visit with your provider if needed.  Visit Complete: Virtual I connected with  Martin Edwards on 12/23/23 by a audio enabled telemedicine application and verified that I am speaking with the correct person using two identifiers.  Patient Location: Home  Provider Location: Office/Clinic  I discussed the limitations of evaluation and management by telemedicine. The patient expressed understanding and agreed to proceed.  Vital Signs: Because this visit was a virtual/telehealth visit, some criteria may be missing or patient reported. Any vitals not documented were not able to be obtained and vitals that have been documented are patient reported.  VideoDeclined- This patient declined Librarian, academic. Therefore the visit was completed with audio only.  Persons Participating in Visit: Patient assisted by son, Jaidon Ellery.  AWV Questionnaire: No: Patient Medicare AWV questionnaire was not completed prior to this visit.  Cardiac Risk Factors include: advanced age (>60men, >32 women);dyslipidemia;hypertension;male gender     Objective:    Today's Vitals   12/23/23 0853  Weight: 139 lb (63 kg)  Height: 5' 7 (1.702 m)   Body mass index is 21.77 kg/m.     12/23/2023    8:53 AM 12/07/2022    2:23 PM 09/25/2021    9:25 AM 04/12/2019    9:36 AM 04/21/2018   10:34 AM 08/29/2017    9:47 AM 08/24/2017    2:40 PM  Advanced Directives  Does Patient Have a Medical Advance Directive? Yes Yes Yes No No  Yes  Yes   Type  of Estate agent of [place];Living will Healthcare Power of [place];Living will Healthcare Power of [place];Living will   Healthcare Power of [place];Living will   Does patient want to make changes to medical advance directive?      No - Patient declined    Copy of Healthcare Power of Attorney in Chart? No - copy requested No - copy requested No - copy requested   No - copy requested    Would patient like information on creating a medical advance directive?    Yes (Inpatient - patient defers creating a medical advance directive at this time - Information given) No - Patient declined        Data saved with a previous flowsheet row definition    Current Medications (verified) Outpatient Encounter Medications as of 12/23/2023  Medication Sig   amLODipine  (NORVASC ) 10 MG tablet TAKE 1 TABLET BY MOUTH DAILY   atorvastatin  (LIPITOR) 40 MG tablet TAKE 1 TABLET BY MOUTH DAILY   Cyanocobalamin  (B-12 PO) Take by mouth.   famotidine  (PEPCID ) 20 MG tablet TAKE 1 TABLET BY MOUTH TWICE  DAILY AS NEEDED FOR HEARTBURN OR INDIGESTION   losartan -hydrochlorothiazide  (HYZAAR) 100-12.5 MG tablet TAKE 1 TABLET BY MOUTH DAILY   pantoprazole  (PROTONIX ) 40 MG tablet TAKE 1 TABLET BY MOUTH DAILY (Patient not taking: Reported on 12/23/2023)   No facility-administered encounter medications on file as of 12/23/2023.    Allergies (verified) Other   History: Past Medical History:  Diagnosis Date   Basal cell carcinoma  of face X 2   Brow ptosis    GERD (gastroesophageal reflux disease)    History of kidney stones    Hyperlipidemia    Hypertension    Prostate cancer (HCC) 1999   Seizures (HCC) 11/2014   brain trauma -hematoma   Small bowel obstruction due to adhesions (HCC) 01/30/2014   Subdural hematoma (HCC)    Vitamin B12 deficiency 12/02/2020   Past Surgical History:  Procedure Laterality Date   APPLICATION OF A-CELL OF HEAD/NECK N/A 08/29/2017   Procedure: APPLICATION OF A-CELL;   Surgeon: Lowery Estefana RAMAN, DO;  Location: [facility] OR;  Service: Plastics;  Laterality: N/A;   BASAL CELL CARCINOMA EXCISION  X 2   off my face both times   BRAIN SURGERY     COLECTOMY  10/2010   took out 8 inches of colon; wasn't cancer   CRANIOTOMY Right 11/11/2014   Procedure: CRANIOTOMY HEMATOMA EVACUATION SUBDURAL;  Surgeon: Catalina Stains, MD;  Location: [facility];  Service: Neurosurgery;  Laterality: Right;   CRANIOTOMY Right 11/13/2014   Procedure: Removal bone flap, insertion into the abdominal wall;  Surgeon: Catalina Stains, MD;  Location: [facility];  Service: Neurosurgery;  Laterality: Right;   CRANIOTOMY N/A 03/07/2015   Procedure: Cranioplasty using Bone flap in abdomen;  Surgeon: Catalina Stains, MD;  Location: [facility];  Service: Neurosurgery;  Laterality: N/A;  Cranioplasty using Bone flap in abdomen   CRANIOTOMY N/A 08/29/2017   Procedure: CRANIECTOMY AND DEBRIDEMENT;  Surgeon: Alix Charleston, MD;  Location[facility];  Service: Neurosurgery;  Laterality: N/A;  CRANIECTOMY AND DEBRIDEMENT   EXCISION MASS NECK Right 04/14/2016   Procedure: EXCISION BIOPSY OF RIGHT NECK LYMPH NODE;  Surgeon: Alm Bouche, MD;  Location: [facility] OR;  Service: ENT;  Laterality: Right;   EXCISION MELANOMA WITH SENTINEL LYMPH NODE BIOPSY Right 04/14/2016   Procedure: WIDE EXCISION MELANOMA RIGHT SHOULDER WITH SENTINEL  NODE MAPPING;  Surgeon: Deward Null III, MD;  Location: [facility] OR;  Service: General;  Laterality: Right;   PROSTATECTOMY  05/20/1998   SCALP LACERATION REPAIR N/A 08/29/2017   Procedure: SCALP FLAP TO COVER DEFECT;  Surgeon: Lowery Estefana RAMAN, DO;  Location: [facility] OR;  Service: Plastics;  Laterality: N/A;   Family History  Problem Relation Age of Onset   Cancer Father        Lung   Alzheimer's disease Sister    Dementia Neg Hx    Social History   Socioeconomic History   Marital status: Divorced    Spouse name: Not on file   Number of children: 2   Years of education: 12   Highest  education level: Not on file  Occupational History   Occupation: retired  Tobacco Use   Smoking status: Former   Smokeless tobacco: Never  Vaping Use   Vaping status: Never Used  Substance and Sexual Activity   Alcohol use: Not Currently    Comment: 01/30/2014 maybe a beer twice/yr   Drug use: No   Sexual activity: Not Currently  Other Topics Concern   Not on file  Social History Narrative   Exercise: daily if he can, does walking    Lives alone   Patient drinks 2 cups of caffeine daily.   Patient is right handed.   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 Risk  (12/23/2023)   Overall Financial Resource Strain (CARDIA)    Difficulty of Paying Living Expenses: Not  hard at all  Food Insecurity: No Food Insecurity (12/23/2023)   Hunger Vital Sign    Worried About Running Out of Food in the Last Year: Never true    Ran Out of Food in the Last Year: Never true  Transportation Needs: No Transportation Needs (12/23/2023)   PRAPARE - Administrator, Civil Service (Medical): No    Lack of Transportation (Non-Medical): No  Physical Activity: Insufficiently Active (12/23/2023)   Exercise Vital Sign    Days of Exercise per Week: 7 days    Minutes of Exercise per Session: 20 min  Stress: No Stress Concern Present (12/23/2023)   [employer] of Occupational Health - Occupational Stress Questionnaire    Feeling of Stress: Not at all  Social Connections: Socially Isolated (12/23/2023)   Social Connection and Isolation Panel    Frequency of Communication with Friends and Family: More than three times a week    Frequency of Social Gatherings with Friends and Family: Twice a week    Attends Religious Services: Never    Database administrator or Organizations: No    Attends Engineer, structural: Never    Marital Status: Divorced    Tobacco Counseling Counseling given: No    Clinical Intake:  Pre-visit preparation  completed: Yes  Pain : No/denies pain     BMI - recorded: 21.77 Nutritional Status: BMI of 19-24  Normal Nutritional Risks: None Diabetes: No  Lab Results  Component Value Date   HGBA1C 6.3 12/20/2023   HGBA1C 5.8 (A) 06/01/2023   HGBA1C 6.1 11/11/2022     How often do you need to have someone help you when you read instructions, pamphlets, or other written materials from your doctor or pharmacy?: 3 - Sometimes (son also helps)  Interpreter Needed?: No  Information entered by :: Verdie Saba, CMA   Activities of Daily Living     12/23/2023    8:57 AM  In your present state of health, do you have any difficulty performing the following activities:  Hearing? 0  Vision? 0  Difficulty concentrating or making decisions? 1  Comment son helps  Walking or climbing stairs? 0  Dressing or bathing? 0  Doing errands, shopping? 1  Comment Family assists  Preparing Food and eating ? N  Using the Toilet? N  In the past six months, have you accidently leaked urine? N  Do you have problems with loss of bowel control? N  Managing your Medications? N  Managing your Finances? Y  Comment son helps  Housekeeping or managing your Housekeeping? N    Patient Care Team: [facility] Emil Schanz, MD as PCP - General (Internal Medicine) Alix Charleston, MD as Consulting Physician (Neurosurgery)  I have updated your Care Teams any recent Medical Services you may have received from other providers in the past year.     Assessment:   This is a routine wellness examination for Martin Edwards.  Hearing/Vision screen Hearing Screening - Comments:: Denies hearing difficulties   Vision Screening - Comments:: Wears eyelasses for reading only - up to date with routine eye exams with  Dr Octavia   Goals Addressed               This Visit's Progress     Patient Stated (pt-stated)        Patient stated he plans to continue walking       Depression Screen     12/23/2023    8:59 AM 12/20/2023  9:36 AM 12/07/2022    2:20 PM 11/30/2022    9:08 AM 11/11/2022    9:55 AM 05/13/2022    9:28 AM 11/10/2021    1:06 PM  PHQ 2/9 Scores  PHQ - 2 Score 0 0 0 0 0 0 0  PHQ- 9 Score 0          Fall Risk     12/23/2023    8:58 AM 12/20/2023    9:36 AM 12/07/2022    2:21 PM 11/30/2022    9:08 AM 11/11/2022    9:55 AM  Fall Risk   Falls in the past year? 0 0 0 0 0  Number falls in past yr: 0 0 0 0 0  Injury with Fall? 0 0 0 0 0  Risk for fall due to : No Fall Risks No Fall Risks No Fall Risks No Fall Risks No Fall Risks  Follow up Falls evaluation completed;Falls prevention discussed Falls evaluation completed Falls prevention discussed Falls evaluation completed Falls evaluation completed    MEDICARE RISK AT HOME:  Medicare Risk at Home Any stairs in or around the home?: Yes (outside) If so, are there any without handrails?: No Home free of loose throw rugs in walkways, pet beds, electrical cords, etc?: Yes Adequate lighting in your home to reduce risk of falls?: Yes Life alert?: No Use of a cane, walker or w/c?: No Grab bars in the bathroom?: No Shower chair or bench in shower?: No Elevated toilet seat or a handicapped toilet?: No  TIMED UP AND GO:  Was the test performed?  No  Cognitive Function: 6CIT completed    08/09/2023   10:45 AM 01/11/2023   10:56 AM 06/30/2022   10:30 AM 12/07/2021    8:16 AM 06/04/2021    1:15 PM  MMSE - Mini Mental State Exam  Orientation to time 2 5 4 3 5   Orientation to Place 3 3 4 4 4   Registration 3 3 3 3 3   Attention/ Calculation 0 0 0 1 3  Recall 3 3 3 3 2   Language- name 2 objects 2 2 0 2 2  Language- repeat 0 0 0 1 1  Language- follow 3 step command 1 3 3 3 2   Language- read & follow direction 0 0 1 1 1   Write a sentence 0 0 1 0 1  Copy design 0 0 0 0 0  Total score 14 19 19 21 24         12/23/2023    9:12 AM 12/07/2022    2:23 PM 05/19/2020    1:02 PM 04/12/2019    9:34 AM  6CIT Screen  What Year? 4 points 0 points 0 points 0  points  What month? 0 points 0 points 0 points 0 points  What time? 0 points 0 points 0 points 0 points  Count back from 20 0 points 0 points 0 points 0 points  Months in reverse 4 points 0 points 0 points 0 points  Repeat phrase 10 points 6 points 2 points 4 points  Total Score 18 points 6 points 2 points 4 points    Immunizations Immunization History  Administered Date(s) Administered   PFIZER(Purple Top)SARS-COV-2 Vaccination 07/24/2019, 08/14/2019   Pneumococcal Conjugate-13 06/18/2014   Pneumococcal-Unspecified 06/14/2010   Tdap 06/27/2009    Screening Tests Health Maintenance  Topic Date Due   Zoster Vaccines- Shingrix (1 of 2) Never done   COVID-19 Vaccine (3 - Pfizer risk series) 01/05/2024 (Originally 09/11/2019)  Pneumococcal Vaccine: 50+ Years (2 of 2 - PCV20 or PCV21) 12/19/2024 (Originally 06/19/2015)   INFLUENZA VACCINE  01/13/2024   Medicare Annual Wellness (AWV)  12/22/2024   Hepatitis C Screening  Completed   Hepatitis B Vaccines  Aged Out   HPV VACCINES  Aged Out   Meningococcal B Vaccine  Aged Out   DTaP/Tdap/Td  Discontinued   Colonoscopy  Discontinued    Health Maintenance  Health Maintenance Due  Topic Date Due   Zoster Vaccines- Shingrix (1 of 2) Never done   Health Maintenance Items Addressed: 12/23/2023   Additional Screening:  Vision Screening: Recommended annual ophthalmology exams for early detection of glaucoma and other disorders of the eye. Would you like a referral to an eye doctor? No  Patient has seen Dr Octavia in 2025 for an eye exam.    Dental Screening: Recommended annual dental exams for proper oral hygiene  Community Resource Referral / Chronic Care Management: CRR required this visit?  No   CCM required this visit?  No   Plan:    I have personally reviewed and noted the following in the patient's chart:   Medical and social history Use of alcohol, tobacco or illicit drugs  Current medications and supplements including  opioid prescriptions. Patient is not currently taking opioid prescriptions. Functional ability and status Nutritional status Physical activity Advanced directives List of other physicians Hospitalizations, surgeries, and ER visits in previous 12 months Vitals Screenings to include cognitive, depression, and falls Referrals and appointments  In addition, I have reviewed and discussed with patient certain preventive protocols, quality metrics, and best practice recommendations. A written personalized care plan for preventive services as well as general preventive health recommendations were provided to patient.   Verdie CHRISTELLA Saba, CMA   12/23/2023   After Visit Summary: (MyChart) Due to this being a telephonic visit, the after visit summary with patients personalized plan was offered to patient via MyChart   Notes: Pt's Cognitive Test (declined).  Please see results - 6-CIT score = 18pts.

## 2023-12-23 NOTE — Patient Instructions (Addendum)
 Martin Edwards , Thank you for taking time out of your busy schedule to complete your Annual Wellness Visit with me. I enjoyed our conversation and look forward to speaking with you again next year. I, as well as your care team,  appreciate your ongoing commitment to your health goals. Please review the following plan we discussed and let me know if I can assist you in the future. Your Game plan/ To Do List     Follow up Visits: Next Medicare AWV with our clinical staff: 12/27/2024   Have you seen your provider in the last 6 months (3 months if uncontrolled diabetes)? Yes Next Office Visit with your provider: 06/2024  Clinician Recommendations:  Aim for 30 minutes of exercise or brisk walking, 6-8 glasses of water, and 5 servings of fruits and vegetables each day. Educated and advised on getting the Shingles and Tdap (Tetenus) vaccines in 2025.      This is a list of the screening recommended for you and due dates:  Health Maintenance  Topic Date Due   Zoster (Shingles) Vaccine (1 of 2) Never done   COVID-19 Vaccine (3 - Pfizer risk series) 01/05/2024*   Pneumococcal Vaccine for age over 1 (2 of 2 - PCV20 or PCV21) 12/19/2024*   Flu Shot  01/13/2024   Medicare Annual Wellness Visit  12/22/2024   Hepatitis C Screening  Completed   Hepatitis B Vaccine  Aged Out   HPV Vaccine  Aged Out   Meningitis B Vaccine  Aged Out   DTaP/Tdap/Td vaccine  Discontinued   Colon Cancer Screening  Discontinued  *Topic was postponed. The date shown is not the original due date.    Advanced directives: (Copy Requested) Please bring a copy of your health care power of attorney and living will to the office to be added to your chart at your convenience. You can mail to [facility] [address] [place], [place] [address] or email to [email or web address] Advance Care Planning is important because it:  [x]  Makes sure you receive the medical care that is consistent with your values, goals,  and preferences  [x]  It provides guidance to your family and loved ones and reduces their decisional burden about whether or not they are making the right decisions based on your wishes.  Follow the link provided in your after visit summary or read over the paperwork we have mailed to you to help you started getting your Advance Directives in place. If you need assistance in completing these, please reach out to us  so that we can help you!

## 2023-12-23 NOTE — Progress Notes (Signed)
 Pt's Cognitive Test (declined).  Please see results from AWV on 12/23/2023 - 6-CIT score = 18pts.

## 2024-02-14 ENCOUNTER — Other Ambulatory Visit: Payer: Self-pay | Admitting: Emergency Medicine

## 2024-02-14 DIAGNOSIS — E785 Hyperlipidemia, unspecified: Secondary | ICD-10-CM

## 2024-02-14 DIAGNOSIS — I10 Essential (primary) hypertension: Secondary | ICD-10-CM

## 2024-02-20 ENCOUNTER — Ambulatory Visit: Payer: Medicare Other | Admitting: Adult Health

## 2024-02-20 ENCOUNTER — Encounter: Payer: Self-pay | Admitting: Adult Health

## 2024-02-20 VITALS — BP 139/75 | HR 102 | Ht 69.0 in | Wt 141.0 lb

## 2024-02-20 DIAGNOSIS — G3109 Other frontotemporal dementia: Secondary | ICD-10-CM

## 2024-02-20 DIAGNOSIS — F0280 Dementia in other diseases classified elsewhere without behavioral disturbance: Secondary | ICD-10-CM

## 2024-02-20 NOTE — Progress Notes (Signed)
 PATIENT: Martin Edwards DOB: Jul 20, 1945  REASON FOR VISIT: follow up HISTORY FROM: patient Primary neurologist: Dr. Onita   Chief Complaint  Patient presents with   RM 19    Patient is here with his daughter and son for memory follow-up.     HISTORY OF PRESENT ILLNESS: Today 02/20/24:  Martin Edwards is a 78 y.o. male with a history of frontotemporal dementia. Returns today for follow-up.  Overall he feels that he has remained relatively stable.  He continues to live at home alone.  Able to complete all ADLs independently.  Operates a motor vehicle without difficulty.  His daughter reports that she rode with him today and did not feel that there was any concerns with his driving.  No change in his mood or behavior.  Denies hallucinations.  He states the most frustrating thing is not being able to get certain words out.  His daughter and son who are with him today also report that he has trouble with word finding.  He returns today for an evaluation.   08/09/23: Martin Edwards is a 78 y.o. male with a history of frontotemporal dementia and seizure-like activity. Returns today for follow-up.  Patient is here with his daughter.  Does feel that short-term memory is slightly worse.  He continues to live home alone.  His son does live nearby.  His daughter lives in [place].  Patient reports that he is able to complete all ADLs independently.  He states that he completes all household chores daughter states that she probably needs to check on this.  Currently the patient is managing his own medications appointments and finances.  Denies any changes in mood or behavior.  Daughter states that he is most frustrated by his speech.  States that sometimes he knows what he wants to say but the words will not come out.  Patient is still operating a motor vehicle.  Daughter does state that on Christmas eve he got lost driving 2 miles to her mother's house.  He returns today for an evaluation.  IMPRESSION:  This MRI of the brain without contrast shows the following: 1.   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 2.   There is marked asymmetry of the atrophy with multiple atrophy in the left frontal and parietal lobes compared to the right side and in the right medial temporal lobe compared to the left side.  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 3.   No acute findings.   01/11/23: Martin Edwards is a 78 y.o. male with a history of Frontotemporal Dementia and seizure like activity. Returns today for follow-up. Here with his son. Feels that he is stable. Lives at home alone. Able to complete all ADLs independently. He continues to manages her own medications, appts and finances. No change in Mood or behavior. No trouble sleeping. PCP has him on Ensure to help with weight loss.   To note Dr. Jenel diagnosed the patient with possible frontotemporal dementia after MRI report.  Patient has not reported any changes in his behavior.   117/24: Martin Edwards is a 78 y.o. male with a history of Frontotemporal Dementia and seizure like activity. Returns today for follow-up. Here with his son. Son reports memory is stable. Able to complete all ADLS independently. He manages he own mediations, appts and finances. Lives alone. No changes in mood or behavior. Sleeps well. Denies any seizure like activity.   12/07/21: Martin Edwards is a 78 year old male  with a history of frontotemporal dementia and seizure-like activity.  He returns today for follow-up.  No behavioral issues per son. No significant changes with his memory. Lives at home alone. Able to complete all ADLS independently. Manages all medications, appts and finances independently. Son is available to help when needed. Denies any seizure like activity.  Son reports that he has had a low B12 level.  Was unable to tolerate oral B12.  Reports that PCP mention sublingual B12 but he has not started this yet.  Reports there is  been no mention of B12 injections.  Advised that he should continue following up with his PCP for B12 levels  06/04/21: Martin Edwards is a 78 year old male with a history of traumatic brain injury, memory disturbance and seizure events.  He returns today for follow-up.  The patient has not had any seizure-like episodes.  Not currently on any medication.  Dr. Jenel felt that after his MRI he most likely had frontotemporal dementia.  He is not on any medication at this time.  Patient notes that his behavior has remained stable.  His son states that there was been only 1 occasion that he switch his insurance abruptly.  He lives at home alone.  He is able to complete all ADLs independently.  He does operate a motor vehicle without difficulty.  Does report that he only drives to familiar places.  Returns today for an evaluation.  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  The patient was seen previously through this office for the seizures and he was eventually able to come off of his seizure medications and is doing well.  In 2019, he had an infection of the bone flap and had to have this removed, the patient claims that since then he has had some increased problems with memory.  He has had troubles with memory since the original head trauma that occurred on 11 Nov 2014.  He particularly noted problems with word finding, but this issue has gradually worsened over time.  He has had short-term memory issues, but he has not altered any activities of daily living.  He still operates a motor vehicle without difficulty, he is keeping up with his own medications and appointments.  He does his finances.  He currently lives alone.  He claims that he sleeps fairly well and has a good energy level.  He denies any numbness or weakness of extremities, he denies headaches or dizziness.  His primary care  physician checked blood work recently that showed evidence of a low vitamin B12 level, he has not yet gone on any supplementation.  He denies any depression problems.  He comes to this office for an evaluation.  REVIEW OF SYSTEMS: Out of a complete 14 system review of symptoms, the patient complains only of the following symptoms, and all other reviewed systems are negative.  ALLERGIES: Allergies  Allergen Reactions   Other Rash    Needs Hypoallergenic Sheets/ Laundry Cart had questionable allergic reaction to detergent last time he was here.    HOME MEDICATIONS: Outpatient Medications Prior to Visit  Medication Sig Dispense Refill   amLODipine  (NORVASC ) 10 MG tablet TAKE 1 TABLET BY MOUTH DAILY 100 tablet 2   atorvastatin  (LIPITOR) 40 MG tablet TAKE 1 TABLET BY MOUTH DAILY 100 tablet 2   Cyanocobalamin  (B-12 PO) Take by mouth.     losartan -hydrochlorothiazide  (HYZAAR)  100-12.5 MG tablet TAKE 1 TABLET BY MOUTH DAILY 100 tablet 2   famotidine  (PEPCID ) 20 MG tablet TAKE 1 TABLET BY MOUTH TWICE  DAILY AS NEEDED FOR HEARTBURN OR INDIGESTION (Patient not taking: Reported on 02/20/2024) 180 tablet 3   pantoprazole  (PROTONIX ) 40 MG tablet TAKE 1 TABLET BY MOUTH DAILY (Patient not taking: Reported on 02/20/2024) 90 tablet 3   No facility-administered medications prior to visit.    PAST MEDICAL HISTORY: Past Medical History:  Diagnosis Date   Basal cell carcinoma of face X 2   Brow ptosis    GERD (gastroesophageal reflux disease)    History of kidney stones    Hyperlipidemia    Hypertension    Prostate cancer (HCC) 1999   Seizures (HCC) 11/2014   brain trauma -hematoma   Small bowel obstruction due to adhesions (HCC) 01/30/2014   Subdural hematoma (HCC)    Vitamin B12 deficiency 12/02/2020    PAST SURGICAL HISTORY: Past Surgical History:  Procedure Laterality Date   APPLICATION OF A-CELL OF HEAD/NECK N/A 08/29/2017   Procedure: APPLICATION OF A-CELL;  Surgeon: Lowery Estefana RAMAN, DO;   Location: [facility] OR;  Service: Plastics;  Laterality: N/A;   BASAL CELL CARCINOMA EXCISION  X 2   off my face both times   BRAIN SURGERY     COLECTOMY  10/2010   took out 8 inches of colon; wasn't cancer   CRANIOTOMY Right 11/11/2014   Procedure: CRANIOTOMY HEMATOMA EVACUATION SUBDURAL;  Surgeon: Catalina Stains, MD;  Location: [facility];  Service: Neurosurgery;  Laterality: Right;   CRANIOTOMY Right 11/13/2014   Procedure: Removal bone flap, insertion into the abdominal wall;  Surgeon: Catalina Stains, MD;  Location: [facility];  Service: Neurosurgery;  Laterality: Right;   CRANIOTOMY N/A 03/07/2015   Procedure: Cranioplasty using Bone flap in abdomen;  Surgeon: Catalina Stains, MD;  Location: [facility];  Service: Neurosurgery;  Laterality: N/A;  Cranioplasty using Bone flap in abdomen   CRANIOTOMY N/A 08/29/2017   Procedure: CRANIECTOMY AND DEBRIDEMENT;  Surgeon: Alix Charleston, MD;  Location: [facility] OR;  Service: Neurosurgery;  Laterality: N/A;  CRANIECTOMY AND DEBRIDEMENT   EXCISION MASS NECK Right 04/14/2016   Procedure: EXCISION BIOPSY OF RIGHT NECK LYMPH NODE;  Surgeon: Alm Bouche, MD;  Location: [facility];  Service: ENT;  Laterality: Right;   EXCISION MELANOMA WITH SENTINEL LYMPH NODE BIOPSY Right 04/14/2016   Procedure: WIDE EXCISION MELANOMA RIGHT SHOULDER WITH SENTINEL  NODE MAPPING;  Surgeon: Deward Null III, MD;  Location: [facility] OR;  Service: General;  Laterality: Right;   PROSTATECTOMY  05/20/1998   SCALP LACERATION REPAIR N/A 08/29/2017   Procedure: SCALP FLAP TO COVER DEFECT;  Surgeon: Lowery Estefana RAMAN, DO;  Location: [facility] OR;  Service: Plastics;  Laterality: N/A;    FAMILY HISTORY: Family History  Problem Relation Age of Onset   Cancer Father        Lung   Alzheimer's disease Sister    Dementia Neg Hx     SOCIAL HISTORY: Social History   Socioeconomic History   Marital status: Divorced    Spouse name: Not on file   Number of children: 2   Years of education: 12    Highest education level: Not on file  Occupational History   Occupation: retired  Tobacco Use   Smoking status: Former   Smokeless tobacco: Never  Advertising account planner   Vaping status: Never Used  Substance and Sexual Activity   Alcohol use: Not Currently  Comment: 01/30/2014 maybe a beer twice/yr   Drug use: No   Sexual activity: Not Currently  Other Topics Concern   Not on file  Social History Narrative   Exercise: daily if he can, does walking    Lives alone   Patient drinks 2 cups of caffeine daily.   Patient is right handed.   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 Risk  (12/23/2023)   Overall Financial Resource Strain (CARDIA)    Difficulty of Paying Living Expenses: Not hard at all  Food Insecurity: No Food Insecurity (12/23/2023)   Hunger Vital Sign    Worried About Running Out of Food in the Last Year: Never true    Ran Out of Food in the Last Year: Never true  Transportation Needs: No Transportation Needs (12/23/2023)   PRAPARE - Administrator, Civil Service (Medical): No    Lack of Transportation (Non-Medical): No  Physical Activity: Insufficiently Active (12/23/2023)   Exercise Vital Sign    Days of Exercise per Week: 7 days    Minutes of Exercise per Session: 20 min  Stress: No Stress Concern Present (12/23/2023)   [employer] of Occupational Health - Occupational Stress Questionnaire    Feeling of Stress: Not at all  Social Connections: Socially Isolated (12/23/2023)   Social Connection and Isolation Panel    Frequency of Communication with Friends and Family: More than three times a week    Frequency of Social Gatherings with Friends and Family: Twice a week    Attends Religious Services: Never    Database administrator or Organizations: No    Attends Banker Meetings: Never    Marital Status: Divorced  Catering manager Violence: Not At Risk (12/23/2023)   Humiliation, Afraid, Rape, and  Kick questionnaire    Fear of Current or Ex-Partner: No    Emotionally Abused: No    Physically Abused: No    Sexually Abused: No      PHYSICAL EXAM  Vitals:   02/20/24 1335  BP: 139/75  Pulse: (!) 102  Weight: 141 lb (64 kg)  Height: 5' 9 (1.753 m)   Body mass index is 20.82 kg/m.     02/20/2024    1:45 PM 08/09/2023   10:45 AM 01/11/2023   10:56 AM  MMSE - Mini Mental State Exam  Orientation to time 1 2 5   Orientation to Place 2 3 3   Registration 3 3 3   Attention/ Calculation 3 0 0  Recall 1 3 3   Language- name 2 objects 0 2 2  Language- repeat 0 0 0  Language- follow 3 step command 3 1 3   Language- read & follow direction 0 0 0  Write a sentence 0 0 0  Copy design 0 0 0  Total score 13 14 19      Generalized: Well developed, in no acute distress   Neurological examination  Mentation: Alert oriented to time, place, history taking. Follows all commands speech and language fluent Cranial nerve II-XII: Pupils were equal round reactive to light. Extraocular movements were full, visual field were full on confrontational test. Facial sensation and strength were normal. Head turning and shoulder shrug  were normal and symmetric. Motor: The motor testing reveals 5 over 5 strength of all 4 extremities. Good symmetric motor tone is noted throughout.  Sensory: Sensory testing is intact to soft touch on all 4 extremities. No evidence of extinction  is noted.  Coordination: Cerebellar testing reveals good finger-nose-finger and heel-to-shin bilaterally.  Gait and station: Gait is normal.  Reflexes: Deep tendon reflexes are symmetric and normal bilaterally.   DIAGNOSTIC DATA (LABS, IMAGING, TESTING) - I reviewed patient records, labs, notes, testing and imaging myself where available.  Lab Results  Component Value Date   WBC 4.9 12/20/2023   HGB 14.8 12/20/2023   HCT 44.0 12/20/2023   MCV 91.8 12/20/2023   PLT 157.0 12/20/2023      Component Value Date/Time   NA 137  12/20/2023 1015   NA 137 04/30/2020 1107   K 3.9 12/20/2023 1015   CL 98 12/20/2023 1015   CO2 31 12/20/2023 1015   GLUCOSE 130 (H) 12/20/2023 1015   BUN 10 12/20/2023 1015   BUN 10 04/30/2020 1107   CREATININE 0.99 12/20/2023 1015   CREATININE 0.97 01/08/2016 1010   CALCIUM  9.5 12/20/2023 1015   PROT 7.4 12/20/2023 1015   PROT 8.0 04/30/2020 1107   ALBUMIN 4.5 12/20/2023 1015   ALBUMIN 5.0 (H) 04/30/2020 1107   AST 16 12/20/2023 1015   ALT 15 12/20/2023 1015   ALKPHOS 61 12/20/2023 1015   BILITOT 1.4 (H) 12/20/2023 1015   BILITOT 0.9 04/30/2020 1107   GFRNONAA 69 04/30/2020 1107   GFRNONAA 79 01/08/2016 1010   GFRAA 80 04/30/2020 1107   GFRAA >89 01/08/2016 1010   Lab Results  Component Value Date   CHOL 136 12/20/2023   HDL 60.50 12/20/2023   LDLCALC 63 12/20/2023   TRIG 62.0 12/20/2023   CHOLHDL 2 12/20/2023   Lab Results  Component Value Date   HGBA1C 6.3 12/20/2023   Lab Results  Component Value Date   VITAMINB12 614 11/11/2022   Lab Results  Component Value Date   TSH 1.56 11/11/2022      ASSESSMENT AND PLAN 78 y.o. year old male  has a past medical history of Basal cell carcinoma of face (X 2), Brow ptosis, GERD (gastroesophageal reflux disease), History of kidney stones, Hyperlipidemia, Hypertension, Prostate cancer (HCC) (1999), Seizures (HCC) (11/2014), Small bowel obstruction due to adhesions (HCC) (01/30/2014), Subdural hematoma (HCC), and Vitamin B12 deficiency (12/02/2020). here with:  1.  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 -- Encouraged the patient's family to continue to monitor his driving.  If the patient is getting lost or having difficulty with driving, then driving should be avoided --We will follow-up in 8-29months or sooner if needed   Duwaine Russell, MSN, NP-C 02/20/2024, 1:51 PM [facility] [address] [place], [place] [address] [phone]  The patient's condition requires frequent monitoring and adjustments in the treatment plan, reflecting the ongoing complexity of care.  This provider is the continuing focal point for all needed services for this condition.

## 2024-03-22 DIAGNOSIS — Z08 Encounter for follow-up examination after completed treatment for malignant neoplasm: Secondary | ICD-10-CM | POA: Diagnosis not present

## 2024-03-22 DIAGNOSIS — Z86007 Personal history of in-situ neoplasm of skin: Secondary | ICD-10-CM | POA: Diagnosis not present

## 2024-03-22 DIAGNOSIS — L814 Other melanin hyperpigmentation: Secondary | ICD-10-CM | POA: Diagnosis not present

## 2024-03-22 DIAGNOSIS — Z85820 Personal history of malignant melanoma of skin: Secondary | ICD-10-CM | POA: Diagnosis not present

## 2024-03-22 DIAGNOSIS — L821 Other seborrheic keratosis: Secondary | ICD-10-CM | POA: Diagnosis not present

## 2024-03-22 DIAGNOSIS — L570 Actinic keratosis: Secondary | ICD-10-CM | POA: Diagnosis not present

## 2024-03-22 DIAGNOSIS — D1801 Hemangioma of skin and subcutaneous tissue: Secondary | ICD-10-CM | POA: Diagnosis not present

## 2024-03-22 DIAGNOSIS — Z85828 Personal history of other malignant neoplasm of skin: Secondary | ICD-10-CM | POA: Diagnosis not present

## 2024-06-21 ENCOUNTER — Encounter: Payer: Self-pay | Admitting: Emergency Medicine

## 2024-06-21 ENCOUNTER — Ambulatory Visit: Admitting: Emergency Medicine

## 2024-06-21 VITALS — BP 130/78 | HR 106 | Temp 98.1°F | Ht 69.0 in | Wt 145.0 lb

## 2024-06-21 DIAGNOSIS — I10 Essential (primary) hypertension: Secondary | ICD-10-CM | POA: Diagnosis not present

## 2024-06-21 DIAGNOSIS — G3109 Other frontotemporal dementia: Secondary | ICD-10-CM

## 2024-06-21 DIAGNOSIS — C434 Malignant melanoma of scalp and neck: Secondary | ICD-10-CM

## 2024-06-21 DIAGNOSIS — E785 Hyperlipidemia, unspecified: Secondary | ICD-10-CM | POA: Diagnosis not present

## 2024-06-21 DIAGNOSIS — F0280 Dementia in other diseases classified elsewhere without behavioral disturbance: Secondary | ICD-10-CM

## 2024-06-21 DIAGNOSIS — R7303 Prediabetes: Secondary | ICD-10-CM | POA: Diagnosis not present

## 2024-06-21 LAB — POCT GLYCOSYLATED HEMOGLOBIN (HGB A1C): HbA1c POC (<> result, manual entry): 6.1 %

## 2024-06-21 NOTE — Assessment & Plan Note (Signed)
Stable.  Sees dermatologist on a regular basis.

## 2024-06-21 NOTE — Assessment & Plan Note (Signed)
Stable.  Sees neurologist on a regular basis. ?

## 2024-06-21 NOTE — Assessment & Plan Note (Signed)
 BP Readings from Last 3 Encounters:  06/21/24 (!) 142/80  02/20/24 139/75  12/20/23 134/72  Normal blood pressure readings at home Well-controlled hypertension Continue amlodipine  10 mg daily and Hyzaar 100-12.5 mg daily

## 2024-06-21 NOTE — Patient Instructions (Signed)
 Health Maintenance After Age 80 After age 27, you are at a higher risk for certain long-term diseases and infections as well as injuries from falls. Falls are a major cause of broken bones and head injuries in people who are older than age 80. Getting regular preventive care can help to keep you healthy and well. Preventive care includes getting regular testing and making lifestyle changes as recommended by your health care provider. Talk with your health care provider about: Which screenings and tests you should have. A screening is a test that checks for a disease when you have no symptoms. A diet and exercise plan that is right for you. What should I know about screenings and tests to prevent falls? Screening and testing are the best ways to find a health problem early. Early diagnosis and treatment give you the best chance of managing medical conditions that are common after age 80. Certain conditions and lifestyle choices may make you more likely to have a fall. Your health care provider may recommend: Regular vision checks. Poor vision and conditions such as cataracts can make you more likely to have a fall. If you wear glasses, make sure to get your prescription updated if your vision changes. Medicine review. Work with your health care provider to regularly review all of the medicines you are taking, including over-the-counter medicines. Ask your health care provider about any side effects that may make you more likely to have a fall. Tell your health care provider if any medicines that you take make you feel dizzy or sleepy. Strength and balance checks. Your health care provider may recommend certain tests to check your strength and balance while standing, walking, or changing positions. Foot health exam. Foot pain and numbness, as well as not wearing proper footwear, can make you more likely to have a fall. Screenings, including: Osteoporosis screening. Osteoporosis is a condition that causes  the bones to get weaker and break more easily. Blood pressure screening. Blood pressure changes and medicines to control blood pressure can make you feel dizzy. Depression screening. You may be more likely to have a fall if you have a fear of falling, feel depressed, or feel unable to do activities that you used to do. Alcohol  use screening. Using too much alcohol  can affect your balance and may make you more likely to have a fall. Follow these instructions at home: Lifestyle Do not drink alcohol  if: Your health care provider tells you not to drink. If you drink alcohol : Limit how much you have to: 0-1 drink a day for women. 0-2 drinks a day for men. Know how much alcohol  is in your drink. In the [place] one drink equals one 12 oz bottle of beer (355 mL), one 5 oz glass of wine (148 mL), or one 1 oz glass of hard liquor (44 mL). Do not use any products that contain nicotine or tobacco. These products include cigarettes, chewing tobacco, and vaping devices, such as e-cigarettes. If you need help quitting, ask your health care provider. Activity  Follow a regular exercise program to stay fit. This will help you maintain your balance. Ask your health care provider what types of exercise are appropriate for you. If you need a cane or walker, use it as recommended by your health care provider. Wear supportive shoes that have nonskid soles. Safety  Remove any tripping hazards, such as rugs, cords, and clutter. Install safety equipment such as grab bars in bathrooms and safety rails on stairs. Keep rooms and walkways  well-lit. General instructions Talk with your health care provider about your risks for falling. Tell your health care provider if: You fall. Be sure to tell your health care provider about all falls, even ones that seem minor. You feel dizzy, tiredness (fatigue), or off-balance. Take over-the-counter and prescription medicines only as told by your health care provider. These include  supplements. Eat a healthy diet and maintain a healthy weight. A healthy diet includes low-fat dairy products, low-fat (lean) meats, and fiber from whole grains, beans, and lots of fruits and vegetables. Stay current with your vaccines. Schedule regular health, dental, and eye exams. Summary Having a healthy lifestyle and getting preventive care can help to protect your health and wellness after age 15. Screening and testing are the best way to find a health problem early and help you avoid having a fall. Early diagnosis and treatment give you the best chance for managing medical conditions that are more common for people who are older than age 80. Falls are a major cause of broken bones and head injuries in people who are older than age 80. Take precautions to prevent a fall at home. Work with your health care provider to learn what changes you can make to improve your health and wellness and to prevent falls. This information is not intended to replace advice given to you by your health care provider. Make sure you discuss any questions you have with your health care provider. Document Revised: 10/20/2020 Document Reviewed: 10/20/2020 Elsevier Patient Education  2024 [employer].

## 2024-06-21 NOTE — Progress Notes (Signed)
 Martin Edwards 79 y.o.   Chief Complaint  Patient presents with   Follow-up    No concerns     HISTORY OF PRESENT ILLNESS: This is a 79 y.o. male here for 18-month follow-up of chronic medical conditions Accompanied by son.  Overall doing well.  Has no complaints or medical concerns today. Wt Readings from Last 3 Encounters:  06/21/24 145 lb (65.8 kg)  02/20/24 141 lb (64 kg)  12/23/23 139 lb (63 kg)     HPI   Prior to Admission medications  Medication Sig Start Date End Date Taking? Authorizing Provider  amLODipine  (NORVASC ) 10 MG tablet TAKE 1 TABLET BY MOUTH DAILY 02/14/24  Yes Eliu Batch, Emil Schanz, MD  atorvastatin  (LIPITOR) 40 MG tablet TAKE 1 TABLET BY MOUTH DAILY 02/14/24  Yes Zaire Vanbuskirk, Emil Schanz, MD  Cyanocobalamin  (B-12 PO) Take by mouth.   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   History of cranioplasty 03/07/2015   Fine motor impairment 11/19/2014   Prostate cancer (HCC) 12/12/2012   Essential hypertension 10/21/2011   Dyslipidemia 10/21/2011   Adenomatous colon polyp 10/21/2011    Past Medical History:  Diagnosis Date   Basal cell carcinoma of face X 2   Brow ptosis    GERD (gastroesophageal reflux disease)    History of kidney stones    Hyperlipidemia    Hypertension    Prostate cancer  (HCC) 1999   Seizures (HCC) 11/2014   brain trauma -hematoma   Small bowel obstruction due to adhesions (HCC) 01/30/2014   Subdural hematoma (HCC)    Vitamin B12 deficiency 12/02/2020    Past Surgical History:  Procedure Laterality Date   APPLICATION OF A-CELL OF HEAD/NECK N/A 08/29/2017   Procedure: APPLICATION OF A-CELL;  Surgeon: Lowery Estefana RAMAN, DO;  Location: [facility] OR;  Service: Plastics;  Laterality: N/A;   BASAL CELL CARCINOMA EXCISION  X 2   off my face both times   BRAIN SURGERY     COLECTOMY  10/2010   took out 8 inches of colon; wasn't cancer   CRANIOTOMY Right 11/11/2014   Procedure: CRANIOTOMY HEMATOMA EVACUATION SUBDURAL;  Surgeon: Catalina Stains, MD;  Location: [facility];  Service: Neurosurgery;  Laterality: Right;   CRANIOTOMY Right 11/13/2014   Procedure: Removal bone flap, insertion into the abdominal wall;  Surgeon: Catalina Stains, MD;  Location: [facility];  Service: Neurosurgery;  Laterality: Right;   CRANIOTOMY N/A 03/07/2015   Procedure: Cranioplasty using Bone flap in abdomen;  Surgeon: Catalina Stains, MD;  Location: [facility];  Service: Neurosurgery;  Laterality: N/A;  Cranioplasty using Bone flap in abdomen   CRANIOTOMY N/A 08/29/2017   Procedure: CRANIECTOMY AND DEBRIDEMENT;  Surgeon: Alix Charleston, MD;  Location: [facility] OR;  Service: Neurosurgery;  Laterality: N/A;  CRANIECTOMY AND DEBRIDEMENT   EXCISION MASS NECK Right 04/14/2016   Procedure: EXCISION BIOPSY OF RIGHT  NECK LYMPH NODE;  Surgeon: Alm Bouche, MD;  Location: [facility] OR;  Service: ENT;  Laterality: Right;   EXCISION MELANOMA WITH SENTINEL LYMPH NODE BIOPSY Right 04/14/2016   Procedure: WIDE EXCISION MELANOMA RIGHT SHOULDER WITH SENTINEL  NODE MAPPING;  Surgeon: Deward Null III, MD;  Location: [facility] OR;  Service: General;  Laterality: Right;   PROSTATECTOMY  05/20/1998   SCALP LACERATION REPAIR N/A 08/29/2017   Procedure: SCALP FLAP TO COVER DEFECT;  Surgeon: Lowery Estefana RAMAN, DO;  Location: [facility] OR;   Service: Plastics;  Laterality: N/A;    Social History   Socioeconomic History   Marital status: Divorced    Spouse name: Not on file   Number of children: 2   Years of education: 12   Highest education level: Not on file  Occupational History   Occupation: retired  Tobacco Use   Smoking status: Former   Smokeless tobacco: Never  Vaping Use   Vaping status: Never Used  Substance and Sexual Activity   Alcohol use: Not Currently    Comment: 01/30/2014 maybe a beer twice/yr   Drug use: No   Sexual activity: Not Currently  Other Topics Concern   Not on file  Social History Narrative   Exercise: daily if he can, does walking    Lives alone   Patient drinks 2 cups of caffeine daily.   Patient is right handed.   Pt lives alone   Retired    Social Drivers of Health   Tobacco Use: Medium Risk (06/21/2024)   Patient History    Smoking Tobacco Use: Former    Smokeless Tobacco Use: Never    Passive Exposure: Not on file  Financial Resource Strain: Low Risk (12/23/2023)   Overall Financial Resource Strain (CARDIA)    Difficulty of Paying Living Expenses: Not hard at all  Food Insecurity: No Food Insecurity (12/23/2023)   Epic    Worried About Programme Researcher, Broadcasting/film/video in the Last Year: Never true    Ran Out of Food in the Last Year: Never true  Transportation Needs: No Transportation Needs (12/23/2023)   Epic    Lack of Transportation (Medical): No    Lack of Transportation (Non-Medical): No  Physical Activity: Insufficiently Active (12/23/2023)   Exercise Vital Sign    Days of Exercise per Week: 7 days    Minutes of Exercise per Session: 20 min  Stress: No Stress Concern Present (12/23/2023)   [employer] of Occupational Health - Occupational Stress Questionnaire    Feeling of Stress: Not at all  Social Connections: Socially Isolated (12/23/2023)   Social Connection and Isolation Panel    Frequency of Communication with Friends and Family: More than three times a week     Frequency of Social Gatherings with Friends and Family: Twice a week    Attends Religious Services: Never    Database Administrator or Organizations: No    Attends Banker Meetings: Never    Marital Status: Divorced  Catering Manager Violence: Not At Risk (12/23/2023)   Epic    Fear of Current or Ex-Partner: No    Emotionally Abused: No    Physically Abused: No    Sexually Abused: No  Depression (PHQ2-9): Low Risk (12/23/2023)   Depression (PHQ2-9)    PHQ-2 Score: 0  Alcohol Screen: Low Risk (12/23/2023)   Alcohol Screen    Last Alcohol Screening Score (AUDIT): 0  Housing: Unknown (12/23/2023)   Epic    Unable to Pay for Housing in  the Last Year: No    Number of Times Moved in the Last Year: Not on file    Homeless in the Last Year: No  Utilities: Not At Risk (12/23/2023)   Epic    Threatened with loss of utilities: No  Health Literacy: Inadequate Health Literacy (12/23/2023)   B1300 Health Literacy    Frequency of need for help with medical instructions: Sometimes    Family History  Problem Relation Age of Onset   Cancer Father        Lung   Alzheimer's disease Sister    Dementia Neg Hx      Review of Systems  Constitutional: Negative.  Negative for chills and fever.  HENT: Negative.  Negative for congestion and sore throat.   Respiratory: Negative.  Negative for cough and shortness of breath.   Cardiovascular: Negative.  Negative for chest pain and palpitations.  Gastrointestinal:  Negative for abdominal pain, diarrhea, nausea and vomiting.  Skin: Negative.  Negative for rash.  Neurological: Negative.  Negative for dizziness and headaches.  All other systems reviewed and are negative.   Vitals:   06/21/24 1003  BP: (!) 142/80  Pulse: (!) 106  Temp: 98.1 F (36.7 C)  SpO2: 99%    Physical Exam Vitals reviewed.  Constitutional:      Appearance: Normal appearance.  HENT:     Head: Normocephalic.     Mouth/Throat:     Mouth: Mucous membranes are  moist.     Pharynx: Oropharynx is clear.  Eyes:     Extraocular Movements: Extraocular movements intact.     Conjunctiva/sclera: Conjunctivae normal.     Pupils: Pupils are equal, round, and reactive to light.  Cardiovascular:     Rate and Rhythm: Normal rate and regular rhythm.     Pulses: Normal pulses.     Heart sounds: Normal heart sounds.  Pulmonary:     Effort: Pulmonary effort is normal.     Breath sounds: Normal breath sounds.  Abdominal:     Palpations: Abdomen is soft.     Tenderness: There is no abdominal tenderness.  Musculoskeletal:     Cervical back: No tenderness.  Lymphadenopathy:     Cervical: No cervical adenopathy.  Skin:    General: Skin is warm and dry.     Capillary Refill: Capillary refill takes less than 2 seconds.  Neurological:     General: No focal deficit present.     Mental Status: He is alert and oriented to person, place, and time. Mental status is at baseline.  Psychiatric:        Mood and Affect: Mood normal.        Behavior: Behavior normal.      ASSESSMENT & PLAN: A total of 40 minutes was spent with the patient and counseling/coordination of care regarding preparing for this visit, review of most recent office visit notes, review of multiple chronic medical conditions and their management, review of all medications, review of most recent bloodwork results, review of health maintenance items, education on nutrition, prognosis, documentation, and need for follow up.   Problem List Items Addressed This Visit       Cardiovascular and Mediastinum   Essential hypertension - Primary   BP Readings from Last 3 Encounters:  06/21/24 (!) 142/80  02/20/24 139/75  12/20/23 134/72  Normal blood pressure readings at home Well-controlled hypertension Continue amlodipine  10 mg daily and Hyzaar 100-12.5 mg daily        Nervous and Auditory  Frontotemporal dementia (HCC)   Stable. Sees neurologist on a regular basis.         Other    Dyslipidemia   Chronic stable condition. Continue atorvastatin  40 mg daily       Malignant melanoma of neck (HCC)   Stable. Sees dermatologist on a regular basis       Prediabetes   Hemoglobin A1c is 6.1 today. Stable chronic condition Diet and nutrition discussed      Relevant Orders   POCT HgB A1C (Completed)   Patient Instructions  Health Maintenance After Age 59 After age 25, you are at a higher risk for certain long-term diseases and infections as well as injuries from falls. Falls are a major cause of broken bones and head injuries in people who are older than age 31. Getting regular preventive care can help to keep you healthy and well. Preventive care includes getting regular testing and making lifestyle changes as recommended by your health care provider. Talk with your health care provider about: Which screenings and tests you should have. A screening is a test that checks for a disease when you have no symptoms. A diet and exercise plan that is right for you. What should I know about screenings and tests to prevent falls? Screening and testing are the best ways to find a health problem early. Early diagnosis and treatment give you the best chance of managing medical conditions that are common after age 80. Certain conditions and lifestyle choices may make you more likely to have a fall. Your health care provider may recommend: Regular vision checks. Poor vision and conditions such as cataracts can make you more likely to have a fall. If you wear glasses, make sure to get your prescription updated if your vision changes. Medicine review. Work with your health care provider to regularly review all of the medicines you are taking, including over-the-counter medicines. Ask your health care provider about any side effects that may make you more likely to have a fall. Tell your health care provider if any medicines that you take make you feel dizzy or sleepy. Strength and balance  checks. Your health care provider may recommend certain tests to check your strength and balance while standing, walking, or changing positions. Foot health exam. Foot pain and numbness, as well as not wearing proper footwear, can make you more likely to have a fall. Screenings, including: Osteoporosis screening. Osteoporosis is a condition that causes the bones to get weaker and break more easily. Blood pressure screening. Blood pressure changes and medicines to control blood pressure can make you feel dizzy. Depression screening. You may be more likely to have a fall if you have a fear of falling, feel depressed, or feel unable to do activities that you used to do. Alcohol use screening. Using too much alcohol can affect your balance and may make you more likely to have a fall. Follow these instructions at home: Lifestyle Do not drink alcohol if: Your health care provider tells you not to drink. If you drink alcohol: Limit how much you have to: 0-1 drink a day for women. 0-2 drinks a day for men. Know how much alcohol is in your drink. In the [place] one drink equals one 12 oz bottle of beer (355 mL), one 5 oz glass of wine (148 mL), or one 1 oz glass of hard liquor (44 mL). Do not use any products that contain nicotine or tobacco. These products include cigarettes, chewing tobacco, and vaping devices, such as  e-cigarettes. If you need help quitting, ask your health care provider. Activity  Follow a regular exercise program to stay fit. This will help you maintain your balance. Ask your health care provider what types of exercise are appropriate for you. If you need a cane or walker, use it as recommended by your health care provider. Wear supportive shoes that have nonskid soles. Safety  Remove any tripping hazards, such as rugs, cords, and clutter. Install safety equipment such as grab bars in bathrooms and safety rails on stairs. Keep rooms and walkways well-lit. General  instructions Talk with your health care provider about your risks for falling. Tell your health care provider if: You fall. Be sure to tell your health care provider about all falls, even ones that seem minor. You feel dizzy, tiredness (fatigue), or off-balance. Take over-the-counter and prescription medicines only as told by your health care provider. These include supplements. Eat a healthy diet and maintain a healthy weight. A healthy diet includes low-fat dairy products, low-fat (lean) meats, and fiber from whole grains, beans, and lots of fruits and vegetables. Stay current with your vaccines. Schedule regular health, dental, and eye exams. Summary Having a healthy lifestyle and getting preventive care can help to protect your health and wellness after age 68. Screening and testing are the best way to find a health problem early and help you avoid having a fall. Early diagnosis and treatment give you the best chance for managing medical conditions that are more common for people who are older than age 43. Falls are a major cause of broken bones and head injuries in people who are older than age 1. Take precautions to prevent a fall at home. Work with your health care provider to learn what changes you can make to improve your health and wellness and to prevent falls. This information is not intended to replace advice given to you by your health care provider. Make sure you discuss any questions you have with your health care provider. Document Revised: 10/20/2020 Document Reviewed: 10/20/2020 Elsevier Patient Education  2024 [employer] Inc[employer]     Emil Schaumann, MD  [facility]    [1]  Allergies Allergen Reactions   Other Rash    Needs Hypoallergenic Sheets/ Laundry Cart had questionable allergic reaction to detergent last time he was here.

## 2024-06-21 NOTE — Assessment & Plan Note (Signed)
 Hemoglobin A1c is 6.1 today. Stable chronic condition Diet and nutrition discussed

## 2024-06-21 NOTE — Assessment & Plan Note (Signed)
Chronic stable condition Continue atorvastatin 40 mg daily 

## 2024-12-19 ENCOUNTER — Ambulatory Visit: Admitting: Emergency Medicine

## 2024-12-24 ENCOUNTER — Ambulatory Visit: Admitting: Adult Health

## 2024-12-27 ENCOUNTER — Ambulatory Visit
